# Patient Record
Sex: Female | Born: 1970 | Race: Black or African American | Hispanic: No | Marital: Married | Smoking: Never smoker
Health system: Southern US, Community
[De-identification: ages and names within clinical notes are randomized; demographics above are authoritative.]

## PROBLEM LIST (undated history)

## (undated) DIAGNOSIS — F200 Paranoid schizophrenia: Secondary | ICD-10-CM

## (undated) DIAGNOSIS — D649 Anemia, unspecified: Secondary | ICD-10-CM

## (undated) DIAGNOSIS — R760 Raised antibody titer: Secondary | ICD-10-CM

## (undated) DIAGNOSIS — D219 Benign neoplasm of connective and other soft tissue, unspecified: Secondary | ICD-10-CM

## (undated) DIAGNOSIS — D34 Benign neoplasm of thyroid gland: Secondary | ICD-10-CM

## (undated) DIAGNOSIS — I10 Essential (primary) hypertension: Secondary | ICD-10-CM

## (undated) DIAGNOSIS — E079 Disorder of thyroid, unspecified: Secondary | ICD-10-CM

## (undated) DIAGNOSIS — F22 Delusional disorders: Secondary | ICD-10-CM

## (undated) DIAGNOSIS — I82409 Acute embolism and thrombosis of unspecified deep veins of unspecified lower extremity: Secondary | ICD-10-CM

## (undated) DIAGNOSIS — E041 Nontoxic single thyroid nodule: Secondary | ICD-10-CM

## (undated) DIAGNOSIS — E0500 Thyrotoxicosis with diffuse goiter without thyrotoxic crisis or storm: Secondary | ICD-10-CM

## (undated) DIAGNOSIS — E042 Nontoxic multinodular goiter: Secondary | ICD-10-CM

## (undated) HISTORY — DX: Benign neoplasm of thyroid gland: D34

## (undated) HISTORY — DX: Raised antibody titer: R76.0

## (undated) HISTORY — PX: ABDOMINAL HYSTERECTOMY: SHX81

---

## 2008-11-06 ENCOUNTER — Ambulatory Visit: Payer: Self-pay | Admitting: Internal Medicine

## 2008-11-06 DIAGNOSIS — D649 Anemia, unspecified: Secondary | ICD-10-CM

## 2008-11-06 DIAGNOSIS — R030 Elevated blood-pressure reading, without diagnosis of hypertension: Secondary | ICD-10-CM | POA: Insufficient documentation

## 2008-11-06 DIAGNOSIS — N926 Irregular menstruation, unspecified: Secondary | ICD-10-CM | POA: Insufficient documentation

## 2008-11-07 LAB — CONVERTED CEMR LAB
BUN: 11 mg/dL (ref 6–23)
Basophils Absolute: 0 10*3/uL (ref 0.0–0.1)
Basophils Relative: 0 % (ref 0–1)
CO2: 23 meq/L (ref 19–32)
Calcium: 9.2 mg/dL (ref 8.4–10.5)
Cholesterol: 125 mg/dL (ref 0–200)
Eosinophils Relative: 3 % (ref 0–5)
FSH: 5.4 milliintl units/mL
Glucose, Bld: 99 mg/dL (ref 70–99)
HDL: 41 mg/dL (ref >39)
MCHC: 32.5 g/dL (ref 30.0–36.0)
Monocytes Absolute: 0.3 10*3/uL (ref 0.1–1.0)
Monocytes Relative: 7 % (ref 3–12)
Neutro Abs: 3.2 10*3/uL (ref 1.7–7.7)
Neutrophils Relative %: 61 % (ref 43–77)
Potassium: 4.4 meq/L (ref 3.5–5.3)
TSH: 1.461 microintl units/mL (ref 0.350–4.50)
Total CHOL/HDL Ratio: 3

## 2010-04-17 ENCOUNTER — Encounter: Payer: Self-pay | Admitting: Orthopedic Surgery

## 2010-04-17 ENCOUNTER — Emergency Department (HOSPITAL_COMMUNITY)
Admission: EM | Admit: 2010-04-17 | Discharge: 2010-04-17 | Payer: Self-pay | Source: Home / Self Care | Admitting: Emergency Medicine

## 2010-04-20 ENCOUNTER — Emergency Department (HOSPITAL_COMMUNITY)
Admission: EM | Admit: 2010-04-20 | Discharge: 2010-04-20 | Payer: Self-pay | Source: Home / Self Care | Admitting: Emergency Medicine

## 2010-04-24 ENCOUNTER — Ambulatory Visit: Payer: Self-pay | Admitting: Orthopedic Surgery

## 2010-04-24 DIAGNOSIS — S5000XA Contusion of unspecified elbow, initial encounter: Secondary | ICD-10-CM

## 2011-01-28 NOTE — Letter (Signed)
Summary: History form  History form   Imported By: Jacklynn Ganong 04/30/2010 12:18:34  _____________________________________________________________________  External Attachment:    Type:   Image     Comment:   External Document
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
## 2011-08-26 ENCOUNTER — Ambulatory Visit: Payer: Self-pay | Admitting: Family Medicine

## 2012-06-11 ENCOUNTER — Observation Stay (HOSPITAL_COMMUNITY)
Admission: EM | Admit: 2012-06-11 | Discharge: 2012-06-12 | Disposition: A | Discharge status: Home / Self Care | Payer: 59 | Attending: Obstetrics and Gynecology | Admitting: Obstetrics and Gynecology

## 2012-06-11 ENCOUNTER — Emergency Department (HOSPITAL_COMMUNITY): Payer: 59

## 2012-06-11 ENCOUNTER — Encounter (HOSPITAL_COMMUNITY): Payer: Self-pay | Admitting: *Deleted

## 2012-06-11 DIAGNOSIS — N920 Excessive and frequent menstruation with regular cycle: Secondary | ICD-10-CM | POA: Insufficient documentation

## 2012-06-11 DIAGNOSIS — D649 Anemia, unspecified: Principal | ICD-10-CM | POA: Insufficient documentation

## 2012-06-11 DIAGNOSIS — R05 Cough: Secondary | ICD-10-CM | POA: Insufficient documentation

## 2012-06-11 DIAGNOSIS — N858 Other specified noninflammatory disorders of uterus: Secondary | ICD-10-CM

## 2012-06-11 DIAGNOSIS — R059 Cough, unspecified: Secondary | ICD-10-CM | POA: Insufficient documentation

## 2012-06-11 DIAGNOSIS — D259 Leiomyoma of uterus, unspecified: Secondary | ICD-10-CM | POA: Insufficient documentation

## 2012-06-11 LAB — URINALYSIS, ROUTINE W REFLEX MICROSCOPIC
Glucose, UA: NEGATIVE mg/dL
Ketones, ur: NEGATIVE mg/dL
Leukocytes, UA: NEGATIVE
Protein, ur: 100 mg/dL — AB
Urobilinogen, UA: 1 mg/dL (ref 0.0–1.0)
pH: 6 (ref 5.0–8.0)

## 2012-06-11 LAB — COMPREHENSIVE METABOLIC PANEL
ALT: 14 U/L (ref 0–35)
Albumin: 2.4 g/dL — ABNORMAL LOW (ref 3.5–5.2)
Calcium: 9.5 mg/dL (ref 8.4–10.5)
Creatinine, Ser: 1.27 mg/dL — ABNORMAL HIGH (ref 0.50–1.10)
GFR calc non Af Amer: 51 mL/min — ABNORMAL LOW (ref >90)
Potassium: 3.4 mEq/L — ABNORMAL LOW (ref 3.5–5.1)
Sodium: 136 mEq/L (ref 135–145)
Total Protein: 8.6 g/dL — ABNORMAL HIGH (ref 6.0–8.3)

## 2012-06-11 LAB — CBC
HCT: 18 % — ABNORMAL LOW (ref 36.0–46.0)
Hemoglobin: 4.9 g/dL — CL (ref 12.0–15.0)
MCH: 15.9 pg — ABNORMAL LOW (ref 26.0–34.0)
MCHC: 27.2 g/dL — ABNORMAL LOW (ref 30.0–36.0)
Platelets: 534 10*3/uL — ABNORMAL HIGH (ref 150–400)
RDW: 20.6 % — ABNORMAL HIGH (ref 11.5–15.5)
WBC: 10.1 10*3/uL (ref 4.0–10.5)

## 2012-06-11 LAB — FERRITIN: Ferritin: 97 ng/mL (ref 10–291)

## 2012-06-11 LAB — IRON AND TIBC
Iron: 12 ug/dL — ABNORMAL LOW (ref 42–135)
TIBC: 183 ug/dL — ABNORMAL LOW (ref 250–470)
UIBC: 171 ug/dL (ref 125–400)

## 2012-06-11 LAB — URINE MICROSCOPIC-ADD ON

## 2012-06-11 LAB — PROTIME-INR: INR: 1.33 (ref 0.00–1.49)

## 2012-06-11 LAB — VITAMIN B12: Vitamin B-12: 1233 pg/mL — ABNORMAL HIGH (ref 211–911)

## 2012-06-11 LAB — LIPASE, BLOOD: Lipase: 13 U/L (ref 11–59)

## 2012-06-11 LAB — RETICULOCYTES: RBC.: 3.09 MIL/uL — ABNORMAL LOW (ref 3.87–5.11)

## 2012-06-11 MED ORDER — ONDANSETRON HCL 4 MG/2ML IJ SOLN
4.0000 mg | Freq: Once | INTRAMUSCULAR | Status: AC
  Administered 2012-06-11: 4 mg via INTRAVENOUS
  Filled 2012-06-11: qty 2

## 2012-06-11 MED ORDER — IOHEXOL 300 MG/ML  SOLN
100.0000 mL | Freq: Once | INTRAMUSCULAR | Status: AC | PRN
  Administered 2012-06-11: 100 mL via INTRAVENOUS

## 2012-06-11 MED ORDER — SODIUM CHLORIDE 0.9 % IV BOLUS (SEPSIS)
2000.0000 mL | Freq: Once | INTRAVENOUS | Status: AC
  Administered 2012-06-11: 2000 mL via INTRAVENOUS

## 2012-06-11 NOTE — ED Notes (Signed)
Blood note: 1930 VS 124/82 100 20 98.3 Pt;s blood started 1940. oPos [id number] Started at [employer] provided; Witnessed by second RN Vernill Rice 1957 VS 132/80 97 100% RA 98.2. No reaction noted Blood rate increased to 125cc/ Education provided; IV assessment unremakable///Attemplted to scan blood would not scan; also was unable to change flowsheet times; Several RNs to assess flowsheet for documention

## 2012-06-11 NOTE — ED Provider Notes (Signed)
History     CSN: [id number]  Arrival date & time 06/11/12  1622   First MD Initiated Contact with Patient 06/11/12 1637      Chief Complaint  Patient presents with  . Emesis     The history is provided by the patient.   patient reports 2 and half months of nausea and vomiting.  She reports she is nauseated everyday and has vomited most days the last 2 months.  She believes she is dehydrated and thinks she needs fluids.  She has not seen anyone about the symptoms.  She denies diarrhea and abdominal pain.  She has no chest pain or shortness of breath.  She has had a cough for last 3 weeks he reports several episodes of posttussive emesis.  She also reports that someone palpated her thyroid until echo is abnormal and stated that she does see Dr. about this.  Her first followup with her primary care physician is in 10 days.  It sounds as though multiple family members were asking her to come to the ER to be evaluated.  The patient's 41 years old she has no known medical problems.  She reports she's checked a pregnancy test at home and it's been negative  History reviewed. No pertinent past medical history.  History reviewed. No pertinent past surgical history.  History reviewed. No pertinent family history.  History  Substance Use Topics  . Smoking status: Never Smoker   . Smokeless tobacco: Not on file  . Alcohol Use: No    OB History    Grav Para Term Preterm Abortions TAB SAB Ect Mult Living                  Review of Systems  Gastrointestinal: Positive for vomiting.  All other systems reviewed and are negative.    Allergies  Review of patient's allergies indicates no known allergies.  Home Medications  No current outpatient prescriptions on file.  BP 120/74  Pulse 114  Temp 99.4 F (37.4 C) (Oral)  Resp 18  Ht 5\' 9"  (1.753 m)  Wt 220 lb (99.791 kg)  BMI 32.49 kg/m2  SpO2 100%  LMP 06/02/2012  Physical Exam  Nursing note and vitals reviewed. Constitutional:  She is oriented to person, place, and time. She appears well-developed and well-nourished. No distress.  HENT:  Head: Normocephalic and atraumatic.       Pale conjunctiva  Eyes: EOM are normal. No scleral icterus.       Conjunctiva pale  Neck: Normal range of motion.  Cardiovascular: Normal rate, regular rhythm and normal heart sounds.   Pulmonary/Chest: Effort normal and breath sounds normal.  Abdominal: Soft. She exhibits no distension. There is no tenderness.       Hard  abdominal mass noted. Nontender. No overlying skin changes  Musculoskeletal: Normal range of motion.  Neurological: She is alert and oriented to person, place, and time.  Skin: Skin is warm and dry. There is pallor.  Psychiatric: She has a normal mood and affect. Judgment normal.    ED Course  Procedures (including critical care time)  Labs Reviewed  URINALYSIS, ROUTINE W REFLEX MICROSCOPIC - Abnormal; Notable for the following:    Color, Urine ORANGE (*)  BIOCHEMICALS MAY BE AFFECTED BY COLOR   Hgb urine dipstick TRACE (*)     Bilirubin Urine SMALL (*)     Protein, ur 100 (*)     All other components within normal limits  CBC - Abnormal; Notable for the  following:    RBC 3.08 (*)     Hemoglobin 4.9 (*)     HCT 18.0 (*)     MCV 58.4 (*)     MCH 15.9 (*)     MCHC 27.2 (*)     RDW 20.6 (*)     Platelets 534 (*)     All other components within normal limits  COMPREHENSIVE METABOLIC PANEL - Abnormal; Notable for the following:    Potassium 3.4 (*)     Glucose, Bld 106 (*)     Creatinine, Ser 1.27 (*)     Total Protein 8.6 (*)     Albumin 2.4 (*)     GFR calc non Af Amer 51 (*)     GFR calc Af Amer 59 (*)     All other components within normal limits  RETICULOCYTES - Abnormal; Notable for the following:    RBC. 3.09 (*)     All other components within normal limits  PROTIME-INR - Abnormal; Notable for the following:    Prothrombin Time 16.7 (*)     All other components within normal limits  URINE  MICROSCOPIC-ADD ON - Abnormal; Notable for the following:    Squamous Epithelial / LPF MANY (*)     Bacteria, UA MANY (*)     All other components within normal limits  LIPASE, BLOOD  PREPARE RBC (CROSSMATCH)  HCG, SERUM, QUALITATIVE  TYPE AND SCREEN  ABO/RH  VITAMIN B12  FOLATE  IRON AND TIBC  FERRITIN   Dg Chest 2 View  06/11/2012  *RADIOLOGY REPORT*  Clinical Data: Chest pain.  CHEST - 2 VIEW  Comparison: No priors.  Findings: Lung volumes are normal.  No consolidative airspace disease.  No pleural effusions.  No pneumothorax.  No pulmonary nodule or mass noted.  Pulmonary vasculature and the cardiomediastinal silhouette are within normal limits.  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  IMPRESSION: 1. No radiographic evidence of acute cardiopulmonary disease.  Original Report Authenticated By: Florencia Reasons, M.D.   Ct Abdomen Pelvis W Contrast  06/11/2012  *RADIOLOGY REPORT*  Clinical Data: Emesis.  Cough for the past 3 weeks.  Vomiting.  Low hemoglobin.  CT ABDOMEN AND PELVIS WITH CONTRAST  Technique:  Multidetector CT imaging of the abdomen and pelvis was performed following the standard protocol during bolus administration of intravenous contrast.  Contrast: OMNIPAQUE IOHEXOL 300 MG/ML  SOLN  Comparison: No priors.  Findings:  Lung Bases: Hiatal hernia.  Abdomen/Pelvis:  The enhanced appearance of the liver, gallbladder, pancreas, spleen, bilateral adrenal glands and bilateral kidneys is unremarkable.  There is a very large mass in the central abdomen and anatomic pelvis.  This mass measures approximately 26 x 25.4 x 17.7 cm and has a heterogeneous internal attenuation with some curvilinear calcifications.  This mass is well circumscribed and does not appear to invade adjacent structures, however, this does appear to be intimately associated with the fornices of the vagina, indicating that this is in fact a mass of uterine origin.  The ovaries  are not confidently identified.  Urinary bladder is displaced inferiorly and to the left related to the mass effect from this large mass, but is otherwise unremarkable.  The appendix is normal.  There is no ascites or pneumoperitoneum and no pathologic distension of bowel.  In the left hemi pelvis there is a focal soft tissue lesion (image 61 of series 2) measuring 2.5 x 1.8 cm, favored to represent  an enlarged lymph node.  No other definite lymphadenopathy is identified throughout the abdomen or pelvis on today's examination, however, there are numerous borderline enlarged retroperitoneal lymph nodes which are conspicuous in number rather than size.  Musculoskeletal: There are no aggressive appearing lytic or blastic lesions noted in the visualized portions of the skeleton. 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  IMPRESSION: 1.  Large mass which appears to arise from within the uterus measuring 26.0 x 25.4 x 17.7 cm which demonstrates heterogeneous internal architecture.  This is favored to represents a very large degenerated fibroid (likely with multiple old prior internal hemorrhages), however, an aggressive neoplasm such as a leiomyosarcoma or less likely an endometrial neoplasm could have a similar appearance. Other differential considerations would include cervical stenosis, however, this is not strongly favored.  Clinical correlation is recommended.  There are multiple borderline enlarged retroperitoneal lymph nodes, and there is a single enlarged left pelvic lymph node, as above. 2. 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 This could be better evaluated with MRI of the lower thoracic spine.  Original Report Authenticated By: Florencia Reasons,  M.D.     1. Anemia   2. Uterine mass       MDM  Patient was symptomatic anemia with hemoglobin of 4.9.  The patient has been written to be transfused several units of packed red blood cells.  She has an abdominal mass that is obvious on external exam.  CT demonstrates a large uterine mass is 26 x 25 x 17 cm which may represent either degenerated fibroid with internal hemorrhages versus aggressive neoplasm.   Spoke with Dr Emelda Fear, Gyn who will evaluate the patient at the bedside        Lyanne Co, MD 06/11/12 2025

## 2012-06-11 NOTE — H&P (Signed)
[name] is an 41 y.o. female. She is admitted for transfusions and care planning. She has seen no Gyn providers in 15+ yrs, and is admitted due to fatigue, and  Anemia, having been found to have hgb 5.9 She has been having heavy menses x 3-5 months,and knows that abdomen has been distended by large mass now noted on CT to be a fibroid enlargement extending to the rib cage.  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    History reviewed. No pertinent past medical history.  History reviewed. No pertinent past surgical history.  History reviewed. No pertinent family history.  Social History:  reports that she has never smoked. She does not have any smokeless tobacco history on file. She reports that she does not drink alcohol or use illicit drugs.  Allergies: No Known Allergies   (Not in a hospital admission)  ROSnot bleeding at present. Has desire for children. Allegedly lost 30 pounds over last few months due to anorexia and decreased appetite. May be associated with huge abd mass know to pt x over a year.  Blood pressure 126/86, pulse 88, temperature 98.2 F (36.8 C), temperature source Oral, resp. rate 20, height 5\' 9"  (1.753 m), weight 99.791 kg (220 lb), last menstrual period 06/02/2012, SpO2 100.00%. Physical ExamPhysical Examination: General appearance - alert, well appearing, and in no distress and normal appearing weight Mental status - alert, oriented to person, place, and time, normal mood, behavior, speech, dress, motor activity, and  thought processes Eyes - pupils equal and reactive, extraocular eye movements intact Neck - supple, no significant adenopathy Chest - clear to auscultation, no wheezes, rales or rhonchi, symmetric air entry Heart - normal rate and regular rhythm Abdomen - distended by firm hard nontender mass to the rib cage, larger than a term pregnancy Extremities - peripheral pulses normal, no pedal edema, no clubbing or cyanosis Skin - normal coloration and turgor, no rashes, no suspicious skin lesions noted   Results for orders placed during the hospital encounter of 06/11/12 (from the past 24 hour(s))  CBC     Status: Abnormal   Collection Time   06/11/12  4:50 PM      Component Value Range   WBC 10.1  4.0 - 10.5 K/uL   RBC 3.08 (*) 3.87 - 5.11 MIL/uL   Hemoglobin 4.9 (*) 12.0 - 15.0 g/dL   HCT 16.1 (*) 09.6 - 04.5 %   MCV 58.4 (*) 78.0 - 100.0 fL   MCH 15.9 (*) 26.0 - 34.0 pg   MCHC 27.2 (*) 30.0 - 36.0 g/dL   RDW 40.9 (*) 81.1 - 91.4 %   Platelets 534 (*) 150 - 400 K/uL  COMPREHENSIVE METABOLIC PANEL     Status: Abnormal   Collection Time   06/11/12  4:50 PM      Component Value Range   Sodium 136  135 - 145 mEq/L   Potassium 3.4 (*) 3.5 - 5.1 mEq/L   Chloride 99  96 - 112 mEq/L   CO2 26  19 - 32 mEq/L   Glucose, Bld 106 (*) 70 -  99 mg/dL   BUN 9  6 - 23 mg/dL   Creatinine, Ser 1.61 (*) 0.50 - 1.10 mg/dL   Calcium 9.5  8.4 - 09.6 mg/dL   Total Protein 8.6 (*) 6.0 - 8.3 g/dL   Albumin 2.4 (*) 3.5 - 5.2 g/dL   AST 17  0 - 37 U/L   ALT 14  0 - 35 U/L   Alkaline Phosphatase 108  39 - 117 U/L   Total Bilirubin 0.4  0.3 - 1.2 mg/dL   GFR calc non Af Amer 51 (*) >90 mL/min   GFR calc Af Amer 59 (*) >90 mL/min  LIPASE, BLOOD     Status: Normal   Collection Time   06/11/12  4:50 PM      Component Value Range   Lipase 13  11 - 59 U/L  RETICULOCYTES     Status: Abnormal   Collection Time   06/11/12  5:36 PM      Component Value Range   Retic Ct Pct 1.7  0.4 - 3.1 %   RBC. 3.09 (*) 3.87  - 5.11 MIL/uL   Retic Count, Manual 52.5  19.0 - 186.0 K/uL  PROTIME-INR     Status: Abnormal   Collection Time   06/11/12  5:36 PM      Component Value Range   Prothrombin Time 16.7 (*) 11.6 - 15.2 seconds   INR 1.33  0.00 - 1.49  HCG, SERUM, QUALITATIVE     Status: Normal   Collection Time   06/11/12  5:36 PM      Component Value Range   Preg, Serum NEGATIVE  NEGATIVE  URINALYSIS, ROUTINE W REFLEX MICROSCOPIC     Status: Abnormal   Collection Time   06/11/12  5:55 PM      Component Value Range   Color, Urine ORANGE (*) YELLOW   APPearance CLEAR  CLEAR   Specific Gravity, Urine 1.020  1.005 - 1.030   pH 6.0  5.0 - 8.0   Glucose, UA NEGATIVE  NEGATIVE mg/dL   Hgb urine dipstick TRACE (*) NEGATIVE   Bilirubin Urine SMALL (*) NEGATIVE   Ketones, ur NEGATIVE  NEGATIVE mg/dL   Protein, ur 045 (*) NEGATIVE mg/dL   Urobilinogen, UA 1.0  0.0 - 1.0 mg/dL   Nitrite NEGATIVE  NEGATIVE   Leukocytes, UA NEGATIVE  NEGATIVE  URINE MICROSCOPIC-ADD ON     Status: Abnormal   Collection Time   06/11/12  5:55 PM      Component Value Range   Squamous Epithelial / LPF MANY (*) RARE   WBC, UA 7-10  <3 WBC/hpf   RBC / HPF 3-6  <3 RBC/hpf   Bacteria, UA MANY (*) RARE  PREPARE RBC (CROSSMATCH)     Status: Normal   Collection Time   06/11/12  6:08 PM      Component Value Range   Order Confirmation ORDER PROCESSED BY BLOOD BANK    TYPE AND SCREEN     Status: Normal (Preliminary result)   Collection Time   06/11/12  6:08 PM      Component Value Range   ABO/RH(D) O POS     Antibody Screen NEG     Sample Expiration 06/14/2012     Unit Number [id number]     Blood Component Type RBC LR PHER1     Unit division 00     Status of Unit ISSUED     Transfusion Status OK TO TRANSFUSE     Crossmatch Result  Compatible     Unit Number [id number]     Blood Component Type RED CELLS,LR     Unit division 00     Status of Unit ALLOCATED     Transfusion Status OK TO TRANSFUSE     Crossmatch Result Compatible      Unit Number [id number]     Blood Component Type RED CELLS,LR     Unit division 00     Status of Unit ALLOCATED     Transfusion Status OK TO TRANSFUSE     Crossmatch Result Compatible     Unit Number [id number]     Blood Component Type RBC LR PHER1     Unit division 00     Status of Unit ALLOCATED     Transfusion Status OK TO TRANSFUSE     Crossmatch Result Compatible    ABO/RH     Status: Normal   Collection Time   06/11/12  6:08 PM      Component Value Range   ABO/RH(D) O POS      Dg Chest 2 View  06/11/2012  *RADIOLOGY REPORT*  Clinical Data: Chest pain.  CHEST - 2 VIEW  Comparison: No priors.  Findings: Lung volumes are normal.  No consolidative airspace disease.  No pleural effusions.  No pneumothorax.  No pulmonary nodule or mass noted.  Pulmonary vasculature and the cardiomediastinal silhouette are within normal limits.  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  IMPRESSION: 1. No radiographic evidence of acute cardiopulmonary disease.  Original Report Authenticated By: Florencia Reasons, M.D.   Ct Abdomen Pelvis W Contrast  06/11/2012  *RADIOLOGY REPORT*  Clinical Data: Emesis.  Cough for the past 3 weeks.  Vomiting.  Low hemoglobin.  CT ABDOMEN AND PELVIS WITH CONTRAST  Technique:  Multidetector CT imaging of the abdomen and pelvis was performed following the standard protocol during bolus administration of intravenous contrast.  Contrast: OMNIPAQUE IOHEXOL 300 MG/ML  SOLN  Comparison: No priors.  Findings:  Lung Bases: Hiatal hernia.  Abdomen/Pelvis:  The enhanced appearance of the liver, gallbladder, pancreas, spleen, bilateral adrenal glands and bilateral kidneys is unremarkable.  There is a very large mass in the central abdomen and anatomic pelvis.  This mass measures approximately 26 x 25.4 x 17.7 cm and has a heterogeneous internal attenuation with some curvilinear calcifications.  This mass is well circumscribed and does not appear to  invade adjacent structures, however, this does appear to be intimately associated with the fornices of the vagina, indicating that this is in fact a mass of uterine origin.  The ovaries are not confidently identified.  Urinary bladder is displaced inferiorly and to the left related to the mass effect from this large mass, but is otherwise unremarkable.  The appendix is normal.  There is no ascites or pneumoperitoneum and no pathologic distension of bowel.  In the left hemi pelvis there is a focal soft tissue lesion (image 61 of series 2) measuring 2.5 x 1.8 cm, favored to represent an enlarged lymph node.  No other definite lymphadenopathy is identified throughout the abdomen or pelvis on today's examination, however, there are numerous borderline enlarged retroperitoneal lymph nodes which are conspicuous in number rather than size.  Musculoskeletal: There are no aggressive appearing lytic or blastic lesions noted in the visualized portions of the skeleton. 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  IMPRESSION: 1.  Large mass which appears to arise from within the uterus measuring 26.0 x 25.4 x 17.7 cm which demonstrates heterogeneous internal architecture.  This is favored to represents a very large degenerated fibroid (likely with multiple old prior internal hemorrhages), however, an aggressive neoplasm such as a leiomyosarcoma or less likely an endometrial neoplasm could have a similar appearance. Other differential considerations would include cervical stenosis, however, this is not strongly favored.  Clinical correlation is recommended.  There are multiple borderline enlarged retroperitoneal lymph nodes, and there is a single enlarged left pelvic lymph node, as above. 2. 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 This could be better evaluated with MRI of the lower thoracic spine.  Original Report Authenticated By: Florencia Reasons, M.D.    Assessment/Plan: Severe anemia, likely chronic Massive fibroid uterus PLAN; Transfuse 4 units rbc              Hemoccult             Office visit ASAP for pap, mammogram,             Probable LUPRON suppression of fibroid prior to scheduling hyster.             Discuss fertility or adoption plans with patient prior to final surgical decision making.   Tanay Massiah V 06/11/2012, 10:00 PM

## 2012-06-11 NOTE — ED Notes (Signed)
Pt unable to void at this time.  Informed pt of needed urine sample.  Verbalized understanding.

## 2012-06-11 NOTE — ED Notes (Signed)
Attempted to call report. RN to call back. 

## 2012-06-11 NOTE — ED Notes (Signed)
hemocult neg result.

## 2012-06-11 NOTE — ED Notes (Addendum)
Second unit of blood started at 50 cc/hr Unit number [id number]  VS 15 min after 111/79  20 89  99.0 No rx after 15 min blood rate increased to 125/h IV site WNL

## 2012-06-11 NOTE — ED Notes (Signed)
Lab called blood is ready to transfuse; Pt in CT at this time; family at bedside

## 2012-06-11 NOTE — ED Notes (Signed)
CRITICAL VALUE ALERT  Critical value received:  hgb 4.9  Date of notification:  06/11/2012  Time of notification:  1735  Critical value read back:yes  Nurse who received alert:  Marliss Czar, RN  MD notified (1st page):  Dr. Azalia Bilis  Time of first page:  1735  MD notified (2nd page):  Time of second page:  Responding MD:  Dr. Azalia Bilis  Time MD responded:  [phone]

## 2012-06-11 NOTE — ED Notes (Signed)
Informed consent for blood transfusion signed and placed in chart.

## 2012-06-11 NOTE — ED Notes (Signed)
"   I'm dehydrated , need my thyroid checked and I have a cough".  Vomiting,No diarrhea.  Cough x 3 weeks.

## 2012-06-12 DIAGNOSIS — D259 Leiomyoma of uterus, unspecified: Secondary | ICD-10-CM | POA: Diagnosis present

## 2012-06-12 LAB — BASIC METABOLIC PANEL
Chloride: 103 mEq/L (ref 96–112)
GFR calc non Af Amer: 71 mL/min — ABNORMAL LOW (ref >90)
Glucose, Bld: 95 mg/dL (ref 70–99)
Potassium: 3.3 mEq/L — ABNORMAL LOW (ref 3.5–5.1)
Sodium: 136 mEq/L (ref 135–145)

## 2012-06-12 LAB — CBC
HCT: 24.3 % — ABNORMAL LOW (ref 36.0–46.0)
Hemoglobin: 7.4 g/dL — ABNORMAL LOW (ref 12.0–15.0)
MCH: 20.2 pg — ABNORMAL LOW (ref 26.0–34.0)
RBC: 3.67 MIL/uL — ABNORMAL LOW (ref 3.87–5.11)

## 2012-06-12 MED ORDER — TRAMADOL HCL 50 MG PO TABS: 50.0000 mg | ORAL_TABLET | Freq: Four times a day (QID) | ORAL | Status: DC | PRN

## 2012-06-12 MED ORDER — METOCLOPRAMIDE HCL 5 MG/ML IJ SOLN: 10.0000 mg | Freq: Four times a day (QID) | INTRAMUSCULAR | Status: DC | PRN

## 2012-06-12 MED ORDER — ACETAMINOPHEN 325 MG PO TABS: 650.0000 mg | ORAL_TABLET | ORAL | Status: DC | PRN

## 2012-06-12 MED ORDER — SODIUM CHLORIDE 0.9 % IJ SOLN: 3.0000 mL | INTRAMUSCULAR | Status: DC | PRN

## 2012-06-12 MED ORDER — CENTRUM PO CHEW: 1.0000 | CHEWABLE_TABLET | Freq: Every day | ORAL | Status: DC

## 2012-06-12 MED ORDER — SODIUM CHLORIDE 0.9 % IV SOLN: 250.0000 mL | INTRAVENOUS | Status: DC | PRN

## 2012-06-12 MED ORDER — ZOLPIDEM TARTRATE 5 MG PO TABS: 5.0000 mg | ORAL_TABLET | Freq: Every evening | ORAL | Status: DC | PRN

## 2012-06-12 MED ORDER — SODIUM CHLORIDE 0.9 % IJ SOLN
3.0000 mL | Freq: Two times a day (BID) | INTRAMUSCULAR | Status: DC
  Administered 2012-06-12: 3 mL via INTRAVENOUS

## 2012-06-12 NOTE — Discharge Summary (Addendum)
Subjective: Patient reports tolerating PO and no problems voiding.  She received 4 units packed cells just completed about 2 hours ago, hemoglobin hematocrit pending. No vaginal bleeding at present. LMP June 6 x7 days  Objective: I have reviewed patient's vital signs and labs. 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

## 2012-06-12 NOTE — Discharge Instructions (Signed)

## 2012-06-12 NOTE — Progress Notes (Signed)
Pt. Discharged to personal vehicle via wheelchair. Currently voices no c/o pain or discomfort. Discharge instructions,meds, and education sheets reviewed with pt. With good understanding.

## 2012-06-13 LAB — TYPE AND SCREEN
ABO/RH(D): O POS
Antibody Screen: NEGATIVE
Unit division: 0
Unit division: 0
Unit division: 0

## 2012-06-17 NOTE — Progress Notes (Signed)
UR Chart Review Completed  

## 2012-06-23 ENCOUNTER — Other Ambulatory Visit: Payer: Self-pay | Admitting: Obstetrics and Gynecology

## 2012-06-23 ENCOUNTER — Other Ambulatory Visit (HOSPITAL_COMMUNITY)
Admission: RE | Admit: 2012-06-23 | Discharge: 2012-06-23 | Disposition: A | Discharge status: Home / Self Care | Payer: 59 | Source: Ambulatory Visit | Attending: Obstetrics and Gynecology | Admitting: Obstetrics and Gynecology

## 2012-06-23 DIAGNOSIS — Z113 Encounter for screening for infections with a predominantly sexual mode of transmission: Secondary | ICD-10-CM | POA: Insufficient documentation

## 2012-06-23 DIAGNOSIS — Z01419 Encounter for gynecological examination (general) (routine) without abnormal findings: Secondary | ICD-10-CM | POA: Insufficient documentation

## 2012-07-18 ENCOUNTER — Encounter (HOSPITAL_COMMUNITY): Payer: Self-pay

## 2012-07-18 ENCOUNTER — Inpatient Hospital Stay (HOSPITAL_COMMUNITY)
Admission: EM | Admit: 2012-07-18 | Discharge: 2012-07-20 | DRG: 812 | Disposition: A | Discharge status: Home / Self Care | Payer: 59 | Attending: Obstetrics and Gynecology | Admitting: Obstetrics and Gynecology

## 2012-07-18 DIAGNOSIS — D649 Anemia, unspecified: Principal | ICD-10-CM | POA: Diagnosis present

## 2012-07-18 DIAGNOSIS — D259 Leiomyoma of uterus, unspecified: Secondary | ICD-10-CM | POA: Diagnosis present

## 2012-07-18 LAB — CBC WITH DIFFERENTIAL/PLATELET
Band Neutrophils: 0 % (ref 0–10)
Basophils Absolute: 0 10*3/uL (ref 0.0–0.1)
Eosinophils Absolute: 0 10*3/uL (ref 0.0–0.7)
Eosinophils Relative: 0 % (ref 0–5)
HCT: 21.4 % — ABNORMAL LOW (ref 36.0–46.0)
MCH: 19.6 pg — ABNORMAL LOW (ref 26.0–34.0)
MCV: 66.5 fL — ABNORMAL LOW (ref 78.0–100.0)
Metamyelocytes Relative: 0 %
Monocytes Absolute: 0.3 10*3/uL (ref 0.1–1.0)
Monocytes Relative: 2 % — ABNORMAL LOW (ref 3–12)
RBC: 3.22 MIL/uL — ABNORMAL LOW (ref 3.87–5.11)
WBC: 15.9 10*3/uL — ABNORMAL HIGH (ref 4.0–10.5)

## 2012-07-18 LAB — BASIC METABOLIC PANEL
Calcium: 9.3 mg/dL (ref 8.4–10.5)
GFR calc Af Amer: 31 mL/min — ABNORMAL LOW (ref >90)
GFR calc non Af Amer: 27 mL/min — ABNORMAL LOW (ref >90)
Sodium: 129 mEq/L — ABNORMAL LOW (ref 135–145)

## 2012-07-18 LAB — PROTIME-INR
INR: 1.53 — ABNORMAL HIGH (ref 0.00–1.49)
Prothrombin Time: 18.7 seconds — ABNORMAL HIGH (ref 11.6–15.2)

## 2012-07-18 MED ORDER — DIPHENHYDRAMINE HCL 25 MG PO CAPS
ORAL_CAPSULE | ORAL | Status: AC
  Administered 2012-07-18: 23:00:00
  Filled 2012-07-18: qty 1

## 2012-07-18 MED ORDER — SODIUM CHLORIDE 0.9 % IJ SOLN
INTRAMUSCULAR | Status: AC
  Administered 2012-07-18
  Filled 2012-07-18: qty 3

## 2012-07-18 MED ORDER — DIPHENHYDRAMINE HCL 25 MG PO CAPS
ORAL_CAPSULE | ORAL | Status: AC
  Administered 2012-07-18: 23:00:00
  Filled 2012-07-18: qty 2

## 2012-07-18 MED ORDER — ACETAMINOPHEN 325 MG PO TABS
ORAL_TABLET | ORAL | Status: AC
  Administered 2012-07-18: 23:00:00
  Filled 2012-07-18: qty 2

## 2012-07-18 NOTE — ED Provider Notes (Signed)
History  This chart was scribed for Hilario Quarry, MD by Erskine Emery. This patient was seen in room APA10/APA10 and the patient's care was started at 16:06.   CSN: [id number]  Arrival date & time 07/18/12  1502   First MD Initiated Contact with Patient 07/18/12 1606      Chief Complaint  Patient presents with  . Weakness    (Consider location/radiation/quality/duration/timing/severity/associated sxs/prior treatment) HPI  [name] is a 41 y.o. female who presents to the Emergency Department complaining she got "too hot" on Friday (3 days ago) with associated weakness, nausea, lightheadedness, and headache. Pt denies any vaginal bleeding and reports only normal periods. 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 Pt reports she was seen here previously for the fibroid tumor and dehydration. Pt reports her thyroid was checked the last time she was here in June (for the dehydration) and it was fine, pt also had a CAT scan and blood work then. Pt last saw Dr. Emelda Fear in July and reports she is going to another doctor at the [facility] tomorrow for the fibroid.   History reviewed. No pertinent past medical history.  History reviewed. No pertinent past surgical history.  No family history on file.  History  Substance Use Topics  . Smoking status: Never Smoker   . Smokeless tobacco: Not on file  . Alcohol Use: No    OB History    Grav Para Term Preterm Abortions TAB SAB Ect Mult Living                  Review of Systems  Constitutional: Negative for fever and chills.  Respiratory: Negative for cough.   Gastrointestinal: Positive for nausea. Negative for vomiting.  Genitourinary: Negative for vaginal bleeding.  Neurological: Positive for weakness and light-headedness. Negative for syncope.  Psychiatric/Behavioral: Negative for behavioral problems and confusion.  All other systems reviewed and are negative.    Allergies  Review of patient's allergies indicates  no known allergies.  Home Medications   Current Outpatient Rx  Name Route Sig Dispense Refill  . CENTRUM PO CHEW Oral Chew 1 tablet by mouth daily. 1 tablet prn    Triage Vitals: BP 127/96  Pulse 122  Temp 98.6 F (37 C) (Oral)  Resp 20  SpO2 100%  Physical Exam  Nursing note and vitals reviewed. Constitutional: She is oriented to person, place, and time. She appears well-developed and well-nourished. No distress.  HENT:  Head: Normocephalic and atraumatic.          Eyes: EOM are normal. Pupils are equal, round, and reactive to light.       Pale conjunctiva.  Neck: Neck supple. No tracheal deviation present.  Cardiovascular: Normal rate, regular rhythm and normal heart sounds.   Pulmonary/Chest: Effort normal and breath sounds normal. No respiratory distress.  Abdominal: Soft. She exhibits mass. There is no tenderness.       Large abdominal mass, firm, nontender.   Musculoskeletal: Normal range of motion. She exhibits no edema.  Neurological: She is alert and oriented to person, place, and time.  Skin: Skin is warm and dry.  Psychiatric: She has a normal mood and affect. Her behavior is normal. Thought content normal.    ED Course  Procedures (including critical care time)  DIAGNOSTIC STUDIES: Oxygen Saturation is 100% on room air, normal by my interpretation.    COORDINATION OF CARE: 16:20--I evaluated the patient and we discussed a treatment plan to which the pt agreed.  Labs Reviewed - No data to display No results found.   No diagnosis found.    MDM   Patient with symptomatic anemia secondary to large fibroid tumor.  Patient being transfuse prbc and care discussed with Dr. Emelda Fear.  He will admit for continued care and possible surgery.      I personally performed the services described in this documentation, which was scribed in my presence. The recorded information has been reviewed and considered.   Hilario Quarry, MD 07/21/12 [phone]

## 2012-07-18 NOTE — ED Notes (Signed)
Pt [place] and [place]".  Pt reports weakness since then.  Pt denies any other symptoms.

## 2012-07-19 LAB — CBC
HCT: 24.1 % — ABNORMAL LOW (ref 36.0–46.0)
MCH: 21.2 pg — ABNORMAL LOW (ref 26.0–34.0)
MCHC: 30.7 g/dL (ref 30.0–36.0)
MCV: 69.1 fL — ABNORMAL LOW (ref 78.0–100.0)
RDW: 25.4 % — ABNORMAL HIGH (ref 11.5–15.5)

## 2012-07-19 LAB — BASIC METABOLIC PANEL
BUN: 34 mg/dL — ABNORMAL HIGH (ref 6–23)
CO2: 26 mEq/L (ref 19–32)
Chloride: 99 mEq/L (ref 96–112)
Creatinine, Ser: 2.06 mg/dL — ABNORMAL HIGH (ref 0.50–1.10)

## 2012-07-19 MED ORDER — SODIUM CHLORIDE 0.9 % IJ SOLN
INTRAMUSCULAR | Status: AC
  Administered 2012-07-19: 16:00:00
  Filled 2012-07-19: qty 3

## 2012-07-19 MED ORDER — SODIUM CHLORIDE 0.9 % IV SOLN
INTRAVENOUS | Status: DC
  Administered 2012-07-19: 100 mL/h via INTRAVENOUS
  Administered 2012-07-20: 05:00:00 via INTRAVENOUS

## 2012-07-19 MED ORDER — ONDANSETRON HCL 4 MG/2ML IJ SOLN: 4.0000 mg | Freq: Four times a day (QID) | INTRAMUSCULAR | Status: DC | PRN

## 2012-07-19 MED ORDER — HYDROCODONE-ACETAMINOPHEN 5-325 MG PO TABS
2.0000 | ORAL_TABLET | ORAL | Status: DC | PRN
  Administered 2012-07-19: 1 via ORAL
  Administered 2012-07-20: 2 via ORAL
  Filled 2012-07-19: qty 1
  Filled 2012-07-19: qty 2

## 2012-07-19 MED ORDER — ZOLPIDEM TARTRATE 5 MG PO TABS: 5.0000 mg | ORAL_TABLET | Freq: Every evening | ORAL | Status: DC | PRN

## 2012-07-19 MED ORDER — ONDANSETRON HCL 4 MG PO TABS
4.0000 mg | ORAL_TABLET | Freq: Four times a day (QID) | ORAL | Status: DC | PRN
  Filled 2012-07-19: qty 1

## 2012-07-19 NOTE — Care Management Note (Signed)
    Page 1 of 1   07/20/2012     2:27:18 PM   CARE MANAGEMENT NOTE 07/20/2012  Patient:  [name], [name]   Account Number:  [id number]  Date Initiated:  07/19/2012  Documentation initiated by:  Sharrie Rothman  Subjective/Objective Assessment:   Pt admitted from home with anemia. Pt lives with husband and will return home with husband at discharge. Pt is independent with ADL's.     Action/Plan:   No CM needs noted.   Anticipated DC Date:  07/20/2012   Anticipated DC Plan:  HOME/SELF CARE         Choice offered to / List presented to:             Status of service:  Completed, signed off Medicare Important Message given?   (If response is "NO", the following Medicare IM given date fields will be blank) Date Medicare IM given:   Date Additional Medicare IM given:    Discharge Disposition:  HOME/SELF CARE  Per UR Regulation:    If discussed at Long Length of Stay Meetings, dates discussed:    Comments:  07/20/12 1426 Arlyss Queen, RN BSN CM Pt discharged home today. No CM needs noted.  07/19/12 1421 Arlyss Queen, RN BSN CM

## 2012-07-19 NOTE — Progress Notes (Signed)
Notified Dr. Emelda Fear of pt c/o nausea. New order given and followed.

## 2012-07-20 LAB — CBC
MCH: 22.6 pg — ABNORMAL LOW (ref 26.0–34.0)
MCV: 70.4 fL — ABNORMAL LOW (ref 78.0–100.0)
Platelets: 316 10*3/uL (ref 150–400)
RDW: 25.3 % — ABNORMAL HIGH (ref 11.5–15.5)

## 2012-07-20 LAB — TYPE AND SCREEN: Unit division: 0

## 2012-07-20 MED ORDER — HYDROCODONE-ACETAMINOPHEN 5-325 MG PO TABS: 2.0000 | ORAL_TABLET | ORAL | Status: AC | PRN

## 2012-07-20 NOTE — Progress Notes (Signed)
This 41 yr old patient , well known to me, has been readmitted for transfusions due to symptomatic anemia, not responding to oral efforts with iron and multivitamins. Patient has a huge fibroid uterus greater than 9 months gestation size that was found an emergency room visit earlier this year when she presented with severe anemia was admitted for transfusion and scheduled for followup in our office to receive Lupron  11.25 mg IM to begin  suppression of the uterus in an effort to prepare for surgery. The patient refused to get her Lupron prescription due to costs considerations, though the prior authorization issues were addressed. She returned to the emergency room here after becoming lightheaded on the busy day outside in the  heat, and has now received 4 units packed cells with subsequent hemoglobin improvement 8.8 hematocrit 27.4 compared to admission hemoglobin 6.3, which must have been hemoconcentrated,. The patient now wants "something done right away." Treatment alternatives here and by referral of been discussed with patient. She agrees to the following. We will: 1 will see her in the office Monday performed a Pap smear that is stated and attempted endometrial biopsy period. Patient began her menstrual period yesterday, like0ly that would prevent adequate Pap smear today. 2. To try coordinate referral to [facility] where she would be willing to go for second opinion and probable surgical removal of the uterus . 3 patient is stable at present for discharge. Subjective: Patient reports tolerating PO and + flatus.    Objective: I have reviewed patient's vital signs and labs.  General: alert, no distress, pale and Noted for exophthalmos which patient says his family have condition Resp: clear to auscultation bilaterally GI: Large mass effect reaches almost to the xiphoid previously confirmed as due to fibroid uterus Extremities: Homans sign is negative, no sign of DVT Vaginal Bleeding:  moderate Neck: Supple without thyromegaly  Assessment/Plan: Symptomatic anemia, corrected Massive fibroid uterus No Pap smear in 30 years Plan discharge home coordinate outpatient Pap smear endometrial biopsy and referral to a facility acceptable to  the patient, likely [facility]    LOS: 2 days    Wesson Stith V 07/20/2012, 12:23 PM

## 2012-07-20 NOTE — Progress Notes (Signed)
UR Chart Review Completed  

## 2012-07-20 NOTE — Discharge Summary (Signed)
Physician Discharge Summary  Patient ID: [name] MRN: [id number] DOB/AGE: 1971/05/08 41 y.o.  Admit date: 07/18/2012 Discharge date: 07/20/2012  Admission Diagnoses: Symptomatic anemia, fibroid uterus, on menses  Discharge Diagnoses: Symptomatic anemia, corrected                                          fibroid uterus massive Active Problems:  * No active hospital problems. *    Discharged Condition: good  Hospital Course: Patient was admitted received 4 units packed cells, but efforts made to arrange for the patient received Lupron injection. The patient no longer is willing to consider waiting and remains resistant to management plans offered. He has now agreed to see [place] in the office when her current menstrual period ends, were Pap smear endometrial biopsy will be performed and referral to tertiary center for surgery will be arranged  Consults: None  Significant Diagnostic Studies: labs:  CBC    Component Value Date/Time   WBC 12.7* 07/20/2012 0056   RBC 3.89 07/20/2012 0056   HGB 8.8* 07/20/2012 0056   HCT 27.4* 07/20/2012 0056   PLT 316 07/20/2012 0056   MCV 70.4* 07/20/2012 0056   MCH 22.6* 07/20/2012 0056   MCHC 32.1 07/20/2012 0056   RDW 25.3* 07/20/2012 0056   LYMPHSABS 0.6* 07/18/2012 1640   MONOABS 0.3 07/18/2012 1640   EOSABS 0.0 07/18/2012 1640   BASOSABS 0.0 07/18/2012 1640    Treatments: Transfusion 4 units packed cells  Discharge Exam: Blood pressure 96/62, pulse 88, temperature 97.6 F (36.4 C), temperature source Oral, resp. rate 20, height 5\' 5"  (1.651 m), weight 110.2 kg (242 lb 15.2 oz), SpO2 95.00%. General appearance: no distress GI: Stable fibroid uterus enlargement to xiphoid  Disposition: 01-Home or Self Care   Medication List  As of 07/20/2012 12:49 PM   ASK your doctor about these medications         metroNIDAZOLE 500 MG tablet   Commonly known as: FLAGYL   Take 500 mg by mouth 2 (two) times daily.           and iron tablets daily and  multivitamin with folic acid  Followup next Monday [facility] Signed: Tilda Burrow 07/20/2012, 12:49 PM

## 2012-07-20 NOTE — H&P (Signed)
[name] is an 41 y.o. female. This 41 yr old patient , well known to me, has been readmitted for transfusions due to symptomatic anemia, not responding to oral efforts with iron and multivitamins. Patient has a huge fibroid uterus greater than 9 months gestation size that was found an emergency room visit earlier this year, mid June, when she presented with severe anemia was admitted for transfusion and scheduled for followup in our office to receive Lupron 11.25 mg IM to begin suppression of the uterus in an effort to prepare for surgery. The patient refused to get her Lupron prescription due to costs considerations, though the prior authorization issues were addressed. She returned to the emergency room here after becoming lightheaded on the busy day outside in the heat, and has now received 4 units packed cells with subsequent hemoglobin improvement 8.8 hematocrit 27.4 compared to admission hemoglobin 6.3, which must have been hemoconcentrated,. She explains the fact that she's not made it to my office for followup care that she has been too busy into much going on, he mentioned transportation but had her lightheaded spell today in [place] where she was at a party for a family member.   Pertinent Gynecological History: Menses: flow is moderate Bleeding: Menses begin Saturday described the patient as light Contraception: none DES exposure: unknown Blood transfusions: Received 2 units packed cells in June Sexually transmitted diseases: no past history Previous GYN Procedures: None  Last mammogram: None Date:  Last pap: None in 30 years Date:  OB History: G0, P0   Menstrual History: Menarche age:  No LMP recorded. July 20    History reviewed. No pertinent past medical history.  History reviewed. No pertinent past surgical history.  No family history on file.  Social History:  reports that she has never smoked. She does not have any smokeless tobacco history on file. She reports  that she does not drink alcohol or use illicit drugs.  Allergies: No Known Allergies  Prescriptions prior to admission  Medication Sig Dispense Refill  . metroNIDAZOLE (FLAGYL) 500 MG tablet Take 500 mg by mouth 2 (two) times daily.        ROS  Blood pressure 96/62, pulse 88, temperature 97.6 F (36.4 C), temperature source Oral, resp. rate 20, height 5\' 5"  (1.651 m), weight 110.2 kg (242 lb 15.2 oz), SpO2 95.00%. Physical ExamPhysical Examination: General appearance - alert, well appearing, and in no distress, oriented to person, place, and time and normal appearing weight Mental status - alert, oriented to person, place, and time, normal mood, behavior, speech, dress, motor activity, and thought processes Eyes - pupils equal and reactive, extraocular eye movements intact, no nystagmus, prominent exophthalmos which patient attributes to family natural appearance, Neck - supple, no significant adenopathy, thyroid exam: thyroid is normal in size without nodules or tenderness Chest - clear to auscultation, no wheezes, rales or rhonchi, symmetric air entry Heart - normal rate and regular rhythm Abdomen - soft, nontender, nondistended, no masses or organomegaly Large fibroid mass to the xiphoid. Previous ultrasound showed measurements 26 x 25 x 17 cm see ultrasound from June 17 Extremities - peripheral pulses normal, no pedal edema, no clubbing or cyanosis   No results found.  Assessment/Plan: Severe symptomatic anemia, fibroid uterus, on menses excellent  plan: patient was admitted and received 4 units packed cells   Koreen Lizaola V 07/20/2012, 12:40 PM

## 2012-07-21 NOTE — Progress Notes (Signed)
UR Chart Review Completed  

## 2012-08-28 ENCOUNTER — Emergency Department (HOSPITAL_COMMUNITY): Payer: 59

## 2012-08-28 ENCOUNTER — Inpatient Hospital Stay (HOSPITAL_COMMUNITY)
Admission: EM | Admit: 2012-08-28 | Discharge: 2012-09-01 | DRG: 844 | Disposition: A | Discharge status: Home / Self Care | Payer: 59 | Attending: Internal Medicine | Admitting: Internal Medicine

## 2012-08-28 ENCOUNTER — Encounter (HOSPITAL_COMMUNITY): Payer: Self-pay | Admitting: Emergency Medicine

## 2012-08-28 DIAGNOSIS — N390 Urinary tract infection, site not specified: Secondary | ICD-10-CM

## 2012-08-28 DIAGNOSIS — E8809 Other disorders of plasma-protein metabolism, not elsewhere classified: Principal | ICD-10-CM | POA: Diagnosis present

## 2012-08-28 DIAGNOSIS — R609 Edema, unspecified: Secondary | ICD-10-CM

## 2012-08-28 DIAGNOSIS — R030 Elevated blood-pressure reading, without diagnosis of hypertension: Secondary | ICD-10-CM

## 2012-08-28 DIAGNOSIS — E0500 Thyrotoxicosis with diffuse goiter without thyrotoxic crisis or storm: Secondary | ICD-10-CM | POA: Diagnosis present

## 2012-08-28 DIAGNOSIS — N926 Irregular menstruation, unspecified: Secondary | ICD-10-CM

## 2012-08-28 DIAGNOSIS — N189 Chronic kidney disease, unspecified: Secondary | ICD-10-CM | POA: Diagnosis present

## 2012-08-28 DIAGNOSIS — Z91199 Patient's noncompliance with other medical treatment and regimen due to unspecified reason: Secondary | ICD-10-CM

## 2012-08-28 DIAGNOSIS — D62 Acute posthemorrhagic anemia: Secondary | ICD-10-CM | POA: Diagnosis present

## 2012-08-28 DIAGNOSIS — Z9119 Patient's noncompliance with other medical treatment and regimen: Secondary | ICD-10-CM

## 2012-08-28 DIAGNOSIS — D259 Leiomyoma of uterus, unspecified: Secondary | ICD-10-CM | POA: Diagnosis present

## 2012-08-28 DIAGNOSIS — E46 Unspecified protein-calorie malnutrition: Secondary | ICD-10-CM | POA: Diagnosis present

## 2012-08-28 DIAGNOSIS — I890 Lymphedema, not elsewhere classified: Secondary | ICD-10-CM | POA: Diagnosis present

## 2012-08-28 DIAGNOSIS — R7989 Other specified abnormal findings of blood chemistry: Secondary | ICD-10-CM | POA: Diagnosis not present

## 2012-08-28 DIAGNOSIS — E876 Hypokalemia: Secondary | ICD-10-CM | POA: Diagnosis present

## 2012-08-28 DIAGNOSIS — N179 Acute kidney failure, unspecified: Secondary | ICD-10-CM | POA: Diagnosis present

## 2012-08-28 DIAGNOSIS — D500 Iron deficiency anemia secondary to blood loss (chronic): Secondary | ICD-10-CM | POA: Diagnosis present

## 2012-08-28 DIAGNOSIS — N921 Excessive and frequent menstruation with irregular cycle: Secondary | ICD-10-CM

## 2012-08-28 HISTORY — DX: Disorder of thyroid, unspecified: E07.9

## 2012-08-28 HISTORY — DX: Benign neoplasm of connective and other soft tissue, unspecified: D21.9

## 2012-08-28 LAB — URINALYSIS, ROUTINE W REFLEX MICROSCOPIC
Glucose, UA: NEGATIVE mg/dL
Protein, ur: 100 mg/dL — AB
Specific Gravity, Urine: 1.025 (ref 1.005–1.030)
Urobilinogen, UA: 1 mg/dL (ref 0.0–1.0)

## 2012-08-28 LAB — COMPREHENSIVE METABOLIC PANEL
Albumin: 2.3 g/dL — ABNORMAL LOW (ref 3.5–5.2)
BUN: 34 mg/dL — ABNORMAL HIGH (ref 6–23)
Calcium: 9.6 mg/dL (ref 8.4–10.5)
Chloride: 100 mEq/L (ref 96–112)
Creatinine, Ser: 2.07 mg/dL — ABNORMAL HIGH (ref 0.50–1.10)
GFR calc non Af Amer: 28 mL/min — ABNORMAL LOW (ref >90)
Total Bilirubin: 0.6 mg/dL (ref 0.3–1.2)

## 2012-08-28 LAB — CBC WITH DIFFERENTIAL/PLATELET
Basophils Absolute: 0 10*3/uL (ref 0.0–0.1)
HCT: 25 % — ABNORMAL LOW (ref 36.0–46.0)
Lymphs Abs: 1.2 10*3/uL (ref 0.7–4.0)
MCH: 21.2 pg — ABNORMAL LOW (ref 26.0–34.0)
MCHC: 30 g/dL (ref 30.0–36.0)
MCV: 70.8 fL — ABNORMAL LOW (ref 78.0–100.0)
Monocytes Absolute: 0.5 10*3/uL (ref 0.1–1.0)
Monocytes Relative: 7 % (ref 3–12)
Neutro Abs: 5.4 10*3/uL (ref 1.7–7.7)
Platelets: 340 10*3/uL (ref 150–400)
RDW: 25.2 % — ABNORMAL HIGH (ref 11.5–15.5)

## 2012-08-28 LAB — D-DIMER, QUANTITATIVE: D-Dimer, Quant: 4.1 ug/mL-FEU — ABNORMAL HIGH (ref 0.00–0.48)

## 2012-08-28 LAB — URINE MICROSCOPIC-ADD ON

## 2012-08-28 LAB — APTT: aPTT: 26 seconds (ref 24–37)

## 2012-08-28 LAB — SAMPLE TO BLOOD BANK

## 2012-08-28 LAB — PROTIME-INR: Prothrombin Time: 14.2 seconds (ref 11.6–15.2)

## 2012-08-28 MED ORDER — SODIUM CHLORIDE 0.9 % IV SOLN
Freq: Once | INTRAVENOUS | Status: AC
  Administered 2012-08-28: 22:00:00 via INTRAVENOUS

## 2012-08-28 NOTE — ED Notes (Signed)
Patient complaining of swelling in bilateral legs and feet. States "I have a fibroid tumor and it is pushing on everything and my thyroid is messed up." Swelling noted in bilateral hands and feet at triage.

## 2012-08-28 NOTE — ED Provider Notes (Signed)
Dr. Phillips Odor to admit pt to [facility].  Benny Lennert, MD 08/28/12 [phone]

## 2012-08-28 NOTE — ED Provider Notes (Signed)
History     CSN: [id number]  Arrival date & time 08/28/12  2105   First MD Initiated Contact with Patient 08/28/12 2120      Chief Complaint  Patient presents with  . Leg Swelling    (Consider location/radiation/quality/duration/timing/severity/associated sxs/prior treatment) HPI Comments: The patient is a 41 year old woman who has a known large uterine fibroid, approximately size for 9 months pregnancy. She had been diagnosed with this in June of 2013. In both June and July of 2013 she had required transfusions because of significant anemia that was symptomatic. She is followed by Christin Bach M.D. her OB/GYN, who is trying to prepare her for surgery. She was recently diagnosed with hyperthyroidism and is on methimazole, having just started that recently. For the past several days she's had swelling of her feet and hands, with a swelling of her feet so pronounce it is painful to walk. She therefore seeks evaluation.  Patient is a 41 y.o. female presenting with general illness. The history is provided by the patient and medical records. No language interpreter was used.  Illness  The current episode started 5 to 7 days ago (She has had progressive swelling of her feet and hands for several days, to the point that it is painful for her to walk.). The onset was gradual. The problem occurs continuously. The problem has been gradually worsening. The problem is severe. Nothing relieves the symptoms. Nothing aggravates the symptoms. Associated symptoms include constipation and cough. Pertinent negatives include no orthopnea, no fever and no abdominal pain. Urine output has been normal. Recently, medical care has been given by a specialist. Services received include medications given.    Past Medical History  Diagnosis Date  . Thyroid disease   . Fibroid tumor     History reviewed. No pertinent past surgical history.  History reviewed. No pertinent family history.  History  Substance Use  Topics  . Smoking status: Never Smoker   . Smokeless tobacco: Not on file  . Alcohol Use: No    OB History    Grav Para Term Preterm Abortions TAB SAB Ect Mult Living                  Review of Systems  Constitutional: Negative.  Negative for fever and chills.  HENT: Negative.   Eyes: Negative.   Respiratory: Positive for cough.        She has had a recent cold and cough.  Cardiovascular: Positive for leg swelling. Negative for orthopnea.  Gastrointestinal: Positive for constipation. Negative for abdominal pain.  Genitourinary: Negative.   Musculoskeletal: Negative.   Skin: Negative.   Psychiatric/Behavioral: Negative.     Allergies  Review of patient's allergies indicates no known allergies.  Home Medications  No current outpatient prescriptions on file.  BP 105/61  Pulse 89  Temp 98.5 F (36.9 C) (Oral)  Resp 20  Ht 5\' 9"  (1.753 m)  Wt 220 lb (99.791 kg)  BMI 32.49 kg/m2  SpO2 100%  LMP 08/24/2012  Physical Exam  Nursing note and vitals reviewed. Constitutional: She is oriented to person, place, and time. She appears well-developed and well-nourished. Distressed: in mild distress complaining of swelling in her hands and feet.  HENT:  Head: Normocephalic and atraumatic.  Right Ear: External ear normal.  Left Ear: External ear normal.  Mouth/Throat: Oropharynx is clear and moist.  Eyes: Conjunctivae and EOM are normal. Pupils are equal, round, and reactive to light.  Neck: Normal range of motion. Neck supple. No JVD  present. No thyromegaly present.  Cardiovascular: Normal rate, regular rhythm and normal heart sounds.   Pulmonary/Chest: Effort normal and breath sounds normal.  Abdominal: Soft.       She has a massively enlarged uterus, approximately 9 months pregnancy size.  Musculoskeletal: Normal range of motion.       She has 2+ edema in the hands and 4+ edema in her feet and ankles extending up to her knees.  Lymphadenopathy:    She has no cervical  adenopathy.  Neurological: She is alert and oriented to person, place, and time.       No sensory or motor deficit.   Skin: Skin is warm and dry.  Psychiatric: She has a normal mood and affect. Her behavior is normal.    ED Course  Procedures (including critical care time)   Labs Reviewed  CBC WITH DIFFERENTIAL  COMPREHENSIVE METABOLIC PANEL  URINALYSIS, ROUTINE W REFLEX MICROSCOPIC  URINE CULTURE  PRO B NATRIURETIC PEPTIDE  D-DIMER, QUANTITATIVE  PROTIME-INR  APTT  SAMPLE TO BLOOD BANK  TSH   9:43 PM Pt seen, physical exam performed.  Old charts reviewed, shows she has a known very large uterine fibroid, and has had significant anemia requiring transfusion in June and July 2013.  She has recently been placed on methimizole for hyperthyroidism.  She now is here with pronounced swelling of hands and feet, pain when she walks because of the swelling.  Lab workup ordered.   Date: 08/28/2012  Rate:88  Rhythm: normal sinus rhythm  QRS Axis: normal  Intervals: normal  ST/T Wave abnormalities: normal  Conduction Disutrbances:none  Narrative Interpretation: Normal EKG  Old EKG Reviewed: changes noted--Rate is slower than on tracing of 07/18/2012.  Results for orders placed during the hospital encounter of 08/28/12  CBC WITH DIFFERENTIAL      Component Value Range   WBC 7.2  4.0 - 10.5 K/uL   RBC 3.53 (*) 3.87 - 5.11 MIL/uL   Hemoglobin 7.5 (*) 12.0 - 15.0 g/dL   HCT 16.1 (*) 09.6 - 04.5 %   MCV 70.8 (*) 78.0 - 100.0 fL   MCH 21.2 (*) 26.0 - 34.0 pg   MCHC 30.0  30.0 - 36.0 g/dL   RDW 40.9 (*) 81.1 - 91.4 %   Platelets 340  150 - 400 K/uL   Neutrophils Relative 76  43 - 77 %   Lymphocytes Relative 16  12 - 46 %   Monocytes Relative 7  3 - 12 %   Eosinophils Relative 1  0 - 5 %   Basophils Relative 0  0 - 1 %   Neutro Abs 5.4  1.7 - 7.7 K/uL   Lymphs Abs 1.2  0.7 - 4.0 K/uL   Monocytes Absolute 0.5  0.1 - 1.0 K/uL   Eosinophils Absolute 0.1  0.0 - 0.7 K/uL   Basophils  Absolute 0.0  0.0 - 0.1 K/uL   RBC Morphology ROULEAUX    COMPREHENSIVE METABOLIC PANEL      Component Value Range   Sodium 135  135 - 145 mEq/L   Potassium 4.1  3.5 - 5.1 mEq/L   Chloride 100  96 - 112 mEq/L   CO2 25  19 - 32 mEq/L   Glucose, Bld 102 (*) 70 - 99 mg/dL   BUN 34 (*) 6 - 23 mg/dL   Creatinine, Ser 7.82 (*) 0.50 - 1.10 mg/dL   Calcium 9.6  8.4 - 95.6 mg/dL   Total Protein 8.7 (*) 6.0 - 8.3  g/dL   Albumin 2.3 (*) 3.5 - 5.2 g/dL   AST 25  0 - 37 U/L   ALT 14  0 - 35 U/L   Alkaline Phosphatase 218 (*) 39 - 117 U/L   Total Bilirubin 0.6  0.3 - 1.2 mg/dL   GFR calc non Af Amer 28 (*) >90 mL/min   GFR calc Af Amer 33 (*) >90 mL/min  URINALYSIS, ROUTINE W REFLEX MICROSCOPIC      Component Value Range   Color, Urine YELLOW  YELLOW   APPearance CLOUDY (*) CLEAR   Specific Gravity, Urine 1.025  1.005 - 1.030   pH 6.0  5.0 - 8.0   Glucose, UA NEGATIVE  NEGATIVE mg/dL   Hgb urine dipstick LARGE (*) NEGATIVE   Bilirubin Urine NEGATIVE  NEGATIVE   Ketones, ur NEGATIVE  NEGATIVE mg/dL   Protein, ur 644 (*) NEGATIVE mg/dL   Urobilinogen, UA 1.0  0.0 - 1.0 mg/dL   Nitrite NEGATIVE  NEGATIVE   Leukocytes, UA TRACE (*) NEGATIVE  PRO B NATRIURETIC PEPTIDE      Component Value Range   Pro B Natriuretic peptide (BNP) 617.9 (*) 0 - 125 pg/mL  D-DIMER, QUANTITATIVE      Component Value Range   D-Dimer, Quant 4.10 (*) 0.00 - 0.48 ug/mL-FEU  PROTIME-INR      Component Value Range   Prothrombin Time 14.2  11.6 - 15.2 seconds   INR 1.08  0.00 - 1.49  APTT      Component Value Range   aPTT 26  24 - 37 seconds  SAMPLE TO BLOOD BANK      Component Value Range   Blood Bank Specimen BBHLD     Sample Expiration 08/31/2012    URINE MICROSCOPIC-ADD ON      Component Value Range   Squamous Epithelial / LPF MANY (*) RARE   WBC, UA 11-20  <3 WBC/hpf   RBC / HPF 21-50  <3 RBC/hpf   Bacteria, UA MANY (*) RARE   Casts GRANULAR CAST (*) NEGATIVE  TYPE AND SCREEN      Component Value  Range   ABO/RH(D) O POS     Antibody Screen NEG     Sample Expiration 08/31/2012    CBC      Component Value Range   WBC 6.9  4.0 - 10.5 K/uL   RBC 3.21 (*) 3.87 - 5.11 MIL/uL   Hemoglobin 7.0 (*) 12.0 - 15.0 g/dL   HCT 03.4 (*) 74.2 - 59.5 %   MCV 70.7 (*) 78.0 - 100.0 fL   MCH 21.8 (*) 26.0 - 34.0 pg   MCHC 30.8  30.0 - 36.0 g/dL   RDW 63.8 (*) 75.6 - 43.3 %   Platelets 315  150 - 400 K/uL   Dg Chest 2 View  08/28/2012  *RADIOLOGY REPORT*  Clinical Data:  Extremity edema.  CHEST - 2 VIEW  Comparison: 06/11/2012  Findings: The heart size and mediastinal contours are within normal limits.  Both lungs are clear.  The visualized skeletal structures are unremarkable.  IMPRESSION: No active disease.   Original Report Authenticated By: Reola Calkins, M.D.    US Venous Img Lower Bilateral  08/29/2012  *RADIOLOGY REPORT*  Clinical Data: Leg swelling, pain.  Pelvic mass.  BILATERAL LOWER EXTREMITY VENOUS DOPPLER ULTRASOUND  Technique: Gray-scale sonography with compression, as well as color and duplex ultrasound, were performed to evaluate the deep venous system from the level of the common femoral vein through the popliteal  and proximal calf veins.  Comparison: CT 06/11/2012  Findings:  Normal compressibility of  the common femoral, superficial femoral, and popliteal veins, as well as the proximal calf veins.  No filling defects to suggest DVT on grayscale or color Doppler imaging.  Doppler waveforms show normal direction of venous flow, normal respiratory phasicity and response to augmentation.  IMPRESSION: No evidence of  lower extremity deep vein thrombosis.   Original Report Authenticated By: Osa Craver, M.D.     Pt signed out to Dr. Estell Harpin at 10:30 P.M.  He arranged pt's admission to [facility].  1. Uterine fibroid   2. Edema       [name], MD 08/29/12 1100

## 2012-08-29 ENCOUNTER — Inpatient Hospital Stay (HOSPITAL_COMMUNITY): Payer: 59

## 2012-08-29 ENCOUNTER — Encounter (HOSPITAL_COMMUNITY): Payer: Self-pay | Admitting: *Deleted

## 2012-08-29 DIAGNOSIS — N390 Urinary tract infection, site not specified: Secondary | ICD-10-CM | POA: Diagnosis present

## 2012-08-29 DIAGNOSIS — D259 Leiomyoma of uterus, unspecified: Secondary | ICD-10-CM

## 2012-08-29 LAB — T3, FREE: T3, Free: 2.4 pg/mL (ref 2.3–4.2)

## 2012-08-29 LAB — CBC
HCT: 22.7 % — ABNORMAL LOW (ref 36.0–46.0)
Hemoglobin: 7 g/dL — ABNORMAL LOW (ref 12.0–15.0)
MCHC: 30.8 g/dL (ref 30.0–36.0)
WBC: 6.9 10*3/uL (ref 4.0–10.5)

## 2012-08-29 LAB — PREPARE RBC (CROSSMATCH)

## 2012-08-29 LAB — T4, FREE: Free T4: 1.33 ng/dL (ref 0.80–1.80)

## 2012-08-29 MED ORDER — SODIUM CHLORIDE 0.9 % IJ SOLN
INTRAMUSCULAR | Status: AC
  Administered 2012-08-29: 10 mL
  Filled 2012-08-29: qty 3

## 2012-08-29 MED ORDER — ALPRAZOLAM 0.5 MG PO TABS
0.5000 mg | ORAL_TABLET | Freq: Every evening | ORAL | Status: DC | PRN
  Administered 2012-08-31: 0.5 mg via ORAL
  Filled 2012-08-29: qty 1

## 2012-08-29 MED ORDER — ONDANSETRON HCL 4 MG PO TABS
4.0000 mg | ORAL_TABLET | Freq: Four times a day (QID) | ORAL | Status: DC | PRN
  Administered 2012-08-30: 4 mg via ORAL
  Filled 2012-08-29: qty 1

## 2012-08-29 MED ORDER — ATENOLOL 25 MG PO TABS
50.0000 mg | ORAL_TABLET | Freq: Every day | ORAL | Status: DC
  Administered 2012-08-29: 50 mg via ORAL
  Filled 2012-08-29: qty 2

## 2012-08-29 MED ORDER — DOCUSATE SODIUM 100 MG PO CAPS
100.0000 mg | ORAL_CAPSULE | Freq: Two times a day (BID) | ORAL | Status: DC
  Administered 2012-08-29 – 2012-09-01 (×7): 100 mg via ORAL
  Filled 2012-08-29 (×11): qty 1

## 2012-08-29 MED ORDER — DEXTROSE 5 % IV SOLN
INTRAVENOUS | Status: AC
  Filled 2012-08-29: qty 10

## 2012-08-29 MED ORDER — ATENOLOL 25 MG PO TABS
25.0000 mg | ORAL_TABLET | Freq: Every day | ORAL | Status: DC
  Administered 2012-08-30 – 2012-09-01 (×3): 25 mg via ORAL
  Filled 2012-08-29 (×3): qty 1

## 2012-08-29 MED ORDER — ALUM & MAG HYDROXIDE-SIMETH 200-200-20 MG/5ML PO SUSP: 30.0000 mL | Freq: Four times a day (QID) | ORAL | Status: DC | PRN

## 2012-08-29 MED ORDER — DEXTROSE 5 % IV SOLN
1.0000 g | INTRAVENOUS | Status: DC
  Administered 2012-08-29 – 2012-09-01 (×4): 1 g via INTRAVENOUS
  Filled 2012-08-29 (×5): qty 10

## 2012-08-29 MED ORDER — OXYCODONE HCL 5 MG PO TABS
5.0000 mg | ORAL_TABLET | ORAL | Status: DC | PRN
  Administered 2012-08-29 – 2012-08-31 (×8): 5 mg via ORAL
  Filled 2012-08-29 (×8): qty 1

## 2012-08-29 MED ORDER — ENOXAPARIN SODIUM 40 MG/0.4ML ~~LOC~~ SOLN
40.0000 mg | SUBCUTANEOUS | Status: DC
  Administered 2012-08-29 – 2012-09-01 (×4): 40 mg via SUBCUTANEOUS
  Filled 2012-08-29 (×4): qty 0.4

## 2012-08-29 MED ORDER — ONDANSETRON HCL 4 MG/2ML IJ SOLN
4.0000 mg | Freq: Four times a day (QID) | INTRAMUSCULAR | Status: DC | PRN
  Administered 2012-08-29: 4 mg via INTRAVENOUS
  Filled 2012-08-29: qty 2

## 2012-08-29 MED ORDER — MORPHINE SULFATE 2 MG/ML IJ SOLN
2.0000 mg | INTRAMUSCULAR | Status: DC | PRN
  Administered 2012-08-29 – 2012-08-31 (×6): 2 mg via INTRAVENOUS
  Filled 2012-08-29 (×6): qty 1

## 2012-08-29 MED ORDER — HYDROCOD POLST-CHLORPHEN POLST 10-8 MG/5ML PO LQCR
5.0000 mL | Freq: Two times a day (BID) | ORAL | Status: DC | PRN
  Administered 2012-08-29 – 2012-08-31 (×3): 5 mL via ORAL
  Filled 2012-08-29 (×3): qty 5

## 2012-08-29 NOTE — Progress Notes (Signed)
Initial skin assessment by Lawson Fiscal RN and Pernell Dupre RN.

## 2012-08-29 NOTE — Progress Notes (Signed)
Patient has a 24 hour urine. There will be blood in her urine due to her menses.

## 2012-08-29 NOTE — H&P (Addendum)
[facility] History and Physical  [name] ZOX:[id number] DOB: May 17, 1971 DOA: 08/28/2012  Referring physician: EDP Zammit PCP: Kirk Ruths, MD   Chief Complaint: Swelling in Legs  HPI: [name] is a 41 y.o. female with Graves Disease (currently on methimazole), Chronic Kidney Disease and chronic blood loss anemia from a massive fibroid uterus who presented to the ED complaining of severe fatigue, severe lower extremity swelling and abdominal pain. She is followed by Dr. Hoyle Barr in [place] who has been managing her care. Per the patient, surgery has been delayed due to her thyroid disease and the size of the tumor, there has been discussion about her being evaluated at [facility] due to the size of her uterus/fibroid mass and the potential for complications given her kidney disease and thyroid disease. The patient has required several blood transfusions in the past for severe anemia related to menorrhagia. She currently denies any chest pain, has SOB on exertion, generalized discomfort related to her abdominal mass- her biggest concern and what brought her into teh ED was acutely worsening peripheral edema, worse in her legs, but also in her hands.  Past Medical History  Diagnosis Date  . Thyroid disease   . Fibroid tumor    History reviewed. No pertinent past surgical history.  Social History:  reports that she has never smoked. She does not have any smokeless tobacco history on file. She reports that she does not drink alcohol or use illicit drugs.   No Known Allergies  History reviewed. No pertinent family history. Family history of thyroid disorder.  Prior to Admission medications   Medication Sig Start Date End Date Taking? Authorizing Provider  atenolol (TENORMIN) 50 MG tablet Take 50 mg by mouth daily.   Yes Historical Provider, MD  Ferrous Sulfate (IRON) 325 (65 FE) MG TABS Take 1 tablet by mouth daily.   Yes Historical Provider, MD  methimazole  (TAPAZOLE) 5 MG tablet Take 5 mg by mouth 2 (two) times daily.   Yes Historical Provider, MD   Physical Exam: Filed Vitals:   08/28/12 2117  BP: 105/61  Pulse: 89  Temp: 98.5 F (36.9 C)  TempSrc: Oral  Resp: 20  Height: 5\' 9"  (1.753 m)  Weight: 99.791 kg (220 lb)  SpO2: 100%     General:  Ill, weak appearing woman, mild distress  Eyes: exopthalmos/proptosis, EOMI  ENT: dry MM, poor dentitin  Neck: medium size goiter  Cardiovascular: RRR, no mrg  Respiratory: CTAB  Abdomen: very large, gravid appearing abd with uterus palpable well above the umblicus, mild tender to palpation  Skin: tense 3-4+ pitting edema in Lower extremities bilaterally, 1+ pitting edema in her hands bilaterally  Musculoskeletal: no joint swelling, moves all extremities equally  Psychiatric: slightly depressed affect  Neurologic: non-focal  Labs on Admission:  Basic Metabolic Panel:  Lab 08/28/12 8119  NA 135  K 4.1  CL 100  CO2 25  GLUCOSE 102*  BUN 34*  CREATININE 2.07*  CALCIUM 9.6  MG --  PHOS --   Liver Function Tests:  Lab 08/28/12 2145  AST 25  ALT 14  ALKPHOS 218*  BILITOT 0.6  PROT 8.7*  ALBUMIN 2.3*   CBC:  Lab 08/28/12 2145  WBC 7.2  NEUTROABS 5.4  HGB 7.5*  HCT 25.0*  MCV 70.8*  PLT 340    BNP (last 3 results)  Basename 08/28/12 2145  PROBNP 617.9*   Radiological Exams on Admission: Dg Chest 2 View  08/28/2012  *RADIOLOGY REPORT*  Clinical Data:  Extremity edema.  CHEST - 2 VIEW  Comparison: 06/11/2012  Findings: The heart size and mediastinal contours are within normal limits.  Both lungs are clear.  The visualized skeletal structures are unremarkable.  IMPRESSION: No active disease.   Original Report Authenticated By: Reola Calkins, M.D.     EKG: Independently reviewed.   Assessment/Plan Active Problems:  Fibroid uterus  Lymphedema  Graves disease  Chronic blood loss anemia  Chronic kidney disease  Positive D dimer  Menometrorrhagia   UTI (lower urinary tract infection)   1. Peripheral edema, quite severe- lower extremity edema may be related to lymphedema from obstruction related to mass effect of uterus, nephrotic syndrome also very high on differential list since the etiology of her renal failure is largely unknown. Large total protein/albumin dissociation. Also has elevated BNP, no prior evaluation for CHF. Edema is pitting, so doubt myxedema or thyroid related.   24 hour Urine for Protienuria, SPEP and UPEP, UA 100+  Renal US/Complete ABD [place]  2D echo in setting of elevated BNP, peripheral edema, consider diuresis if diastolic failure  2.  Anemia, Chronic Blood Loss from Menorrhagia  Typed and Screened, not actively bleeding, if AM CBC <7 will transfuse  3. Fibroid Uterine Mass, very large, likely causing significant mass effect in abdomen including IVC compression, possible urinary retention, lymphatic compression, while certainly not emergent this patient needs to have surgical intervention soon- despite her medical co-morbidities which are chronic and not a contraindication to this much needed surgery. The patient is requesting possible transfer to [facility] for definitive management. Dr. Emelda Fear following, I spoke with the on-call GYN, Dr. Debroah Loop at [facility] who would not accept the patient in transfer due to the non-acute nature of her presentation, he felt she would best be managed by Dr. Emelda Fear here in [place] or that a transfer to [facility] would be more appropriate given the size of the mass and possible surgical complexity. Will need to contact [facility] or call coverage in AM.  4.  Graves Disease, on Methimazole, patient has extremity swelling and elevated Alkaline Phos and Renal failure which are possible toxicities and contraindication to using Methimazole, this has been discontinued. She will need Endocrinology evaluation and management for her Graves Disease and possible ablation.   Free T3, Free T4 and  TSH all pending  Discontinued Methimazole  5. Chronic Renal Failure, baseline Scr is around 2.0 and has been stable for several months, the etiology of her renal failure is unknown, and per records and patient has not been evaluated or worked up in detail. Concern for nephrotic syndrome, work-up above.   6. Elevated D. Dimer, no hypoxia or tachycardia or chest pain, doubt PE, will obtain LE dopplers, if she is nephrotic she may be hypercoagulable, may also be related to uterine mass if internal hemorrhage has occurred and significant clotting present.  7. UTI/Pyuria  Empiric Rocephin  Code Status: Full Code Family Communication: Discussed plan of care with patient Disposition Plan: Discharge home when medically stable or transfer to [facility] for definitive management/patient request.  Time spent: 70 min  [facility] Pager [phone]  If 7PM-7AM, please contact night-coverage [email or web address] Password [email or web address] 08/29/2012, 1:01 AM

## 2012-08-29 NOTE — Progress Notes (Signed)
[facility] PROGRESS NOTE  WILL SCHIER ZOX:[id number] DOB: [birth year] DOA: 08/28/2012 PCP: Kirk Ruths, MD  Assessment/Plan: Active Problems:  Fibroid uterus  Lymphedema  Graves disease  Chronic blood loss anemia  Chronic kidney disease  Positive D dimer  Menometrorrhagia  UTI (lower urinary tract infection)  1. Peripheral edema, quite severe- lower extremity edema may be related to lymphedema from obstruction related to mass effect of uterus, nephrotic syndrome also very high on differential list since the etiology of her renal failure is largely unknown. Large total protein/albumin dissociation. Also has elevated BNP, no prior evaluation for CHF. Edema is pitting, so doubt myxedema or thyroid related. 24 hour Urine for Protienuria, SPEP and UPEP, UA 100+  Renal US/Complete ABD [place] does not show hydronephrosis.  2D echo in setting of elevated BNP, peripheral edema, consider diuresis if diastolic failure 2. Anemia, Chronic Blood Loss from Menorrhagia  Typed and Screened, actively bleeding, and will transfuse 2 units PRBC. Repeat CBC in am.  3. Fibroid Uterine Mass, very large, likely causing significant mass effect in abdomen including IVC compression, possible urinary retention, lymphatic compression, while certainly not emergent this patient needs to have surgical intervention soon- despite her medical co-morbidities which are chronic and not a contraindication to this much needed surgery. The patient is requesting possible transfer to [facility] for definitive management. Dr. Emelda Fear following, I spoke with the on-call GYN, Dr. Debroah Loop at [facility] who would not accept the patient in transfer due to the non-acute nature of her presentation, he felt she would best be managed by Dr. Emelda Fear here in [place] or that a transfer to [facility] would be more appropriate given the size of the mass and possible surgical complexity. Spoke to dr Shawnie Pons for consult.  4. Graves Disease, on  Methimazole, patient has extremity swelling and elevated Alkaline Phos and Renal failure which are possible toxicities and contraindication to using Methimazole, this has been discontinued. She will need Endocrinology evaluation and management for her Graves Disease and possible ablation.  Free T3, Free T4 and TSH all pending  Discontinued Methimazole 5. Chronic Renal Failure, baseline Scr is around 2.0 and has been stable for several months, the etiology of her renal failure is unknown, and per records and patient has not been evaluated or worked up in detail. Concern for nephrotic syndrome, work-up above.  6. Elevated D. Dimer, no hypoxia or tachycardia or chest pain, doubt PE, LE dopplers neg for DVT., if she is nephrotic she may be hypercoagulable, may also be related to uterine mass if internal hemorrhage has occurred and significant clotting present.  7. UTI/Pyuria  Empiric Rocephin      HPI/Subjective: SLIGHTLY ANXIOUS.   Objective: Filed Vitals:   08/29/12 0530 08/29/12 0900 08/29/12 1318 08/29/12 1353  BP: 108/73 108/74 99/63 97/63   Pulse: 84 85 78 79  Temp: 98.3 F (36.8 C)  98.3 F (36.8 C) 98.2 F (36.8 C)  TempSrc:    Oral  Resp: 20  18 20   Height:      Weight:      SpO2: 99%  99%     Intake/Output Summary (Last 24 hours) at 08/29/12 1427 Last data filed at 08/29/12 1353  Gross per 24 hour  Intake  867.5 ml  Output    200 ml  Net  667.5 ml   Filed Weights   08/28/12 2117 08/29/12 0120  Weight: 99.791 kg (220 lb) 103.1 kg (227 lb 4.7 oz)    Exam:   General:  Alert febrile  Cardiovascular: s1s2  Respiratory: CTAB  Abdomen: soft NT ND BS+  Data Reviewed: Basic Metabolic Panel:  Lab 08/28/12 1610  NA 135  K 4.1  CL 100  CO2 25  GLUCOSE 102*  BUN 34*  CREATININE 2.07*  CALCIUM 9.6  MG --  PHOS --   Liver Function Tests:  Lab 08/28/12 2145  AST 25  ALT 14  ALKPHOS 218*  BILITOT 0.6  PROT 8.7*  ALBUMIN 2.3*   No results found for  this basename: LIPASE:5,AMYLASE:5 in the last 168 hours No results found for this basename: AMMONIA:5 in the last 168 hours CBC:  Lab 08/29/12 0602 08/28/12 2145  WBC 6.9 7.2  NEUTROABS -- 5.4  HGB 7.0* 7.5*  HCT 22.7* 25.0*  MCV 70.7* 70.8*  PLT 315 340   Cardiac Enzymes: No results found for this basename: CKTOTAL:5,CKMB:5,CKMBINDEX:5,TROPONINI:5 in the last 168 hours BNP (last 3 results)  Basename 08/28/12 2145  PROBNP 617.9*   CBG: No results found for this basename: GLUCAP:5 in the last 168 hours  No results found for this or any previous visit (from the past 240 hour(s)).   Studies: Dg Chest 2 View  08/28/2012  *RADIOLOGY REPORT*  Clinical Data:  Extremity edema.  CHEST - 2 VIEW  Comparison: 06/11/2012  Findings: The heart size and mediastinal contours are within normal limits.  Both lungs are clear.  The visualized skeletal structures are unremarkable.  IMPRESSION: No active disease.   Original Report Authenticated By: Reola Calkins, M.D.    US Abdomen Complete  08/29/2012  *RADIOLOGY REPORT*  Clinical Data:  Elevated liver function tests.  Renal failure.  COMPLETE ABDOMINAL ULTRASOUND  Comparison:  CT dated 06/11/2012.  Findings:  Gallbladder:  No gallstones, gallbladder wall thickening, or pericholecystic fluid.  Common bile duct:  Normal, measuring 4.5 mm in diameter proximally.  Liver:  No focal lesion identified.  Within normal limits in parenchymal echogenicity.  IVC:  Appears normal.  Pancreas:  Obscured by overlying bowel gas.  Normal on the previous CT.  Spleen:  Normal, measuring 8.8 cm in length.  Right Kidney:  Borderline echogenic.  Otherwise, normal, measuring 10.5 cm in length.  No hydronephrosis.  Left Kidney:  Borderline echogenic.  Otherwise, normal, measuring 11.7 cm in length.  No hydronephrosis.  Abdominal aorta:  Normal in caliber at the level of the proximal and mid aorta.  The distal aorta is obscured by the previously demonstrated large uterine mass.   IMPRESSION:  1.  No acute abnormality. 2.  Borderline echogenic kidneys, suggesting mild medical renal disease. 3.  Obscured pancreas by overlying bowel gas. 4.  Obscured distal abdominal aorta by the previously demonstrated large uterine mass.   Original Report Authenticated By: Darrol Angel, M.D.    US Venous Img Lower Bilateral  08/29/2012  *RADIOLOGY REPORT*  Clinical Data: Leg swelling, pain.  Pelvic mass.  BILATERAL LOWER EXTREMITY VENOUS DOPPLER ULTRASOUND  Technique: Gray-scale sonography with compression, as well as color and duplex ultrasound, were performed to evaluate the deep venous system from the level of the common femoral vein through the popliteal and proximal calf veins.  Comparison: CT 06/11/2012  Findings:  Normal compressibility of  the common femoral, superficial femoral, and popliteal veins, as well as the proximal calf veins.  No filling defects to suggest DVT on grayscale or color Doppler imaging.  Doppler waveforms show normal direction of venous flow, normal respiratory phasicity and response to augmentation.  IMPRESSION: No evidence of  lower extremity deep vein  thrombosis.   Original Report Authenticated By: Thora Lance III, M.D.     Scheduled Meds:   . sodium chloride   Intravenous Once  . atenolol  25 mg Oral Daily  . cefTRIAXone (ROCEPHIN)  IV  1 g Intravenous Q24H  . docusate sodium  100 mg Oral BID  . enoxaparin (LOVENOX) injection  40 mg Subcutaneous Q24H  . sodium chloride      . DISCONTD: atenolol  50 mg Oral Daily   Continuous Infusions:   Active Problems:  Fibroid uterus  Lymphedema  Graves disease  Chronic blood loss anemia  Chronic kidney disease  Positive D dimer  Menometrorrhagia  UTI (lower urinary tract infection)        [name]  [facility] Pager [phone]. If 8PM-8AM, please contact night-coverage at [email or web address], password [facility] 08/29/2012, 2:27 PM  LOS: 1 day

## 2012-08-30 DIAGNOSIS — N926 Irregular menstruation, unspecified: Secondary | ICD-10-CM

## 2012-08-30 DIAGNOSIS — D500 Iron deficiency anemia secondary to blood loss (chronic): Secondary | ICD-10-CM

## 2012-08-30 DIAGNOSIS — R609 Edema, unspecified: Secondary | ICD-10-CM

## 2012-08-30 DIAGNOSIS — E0500 Thyrotoxicosis with diffuse goiter without thyrotoxic crisis or storm: Secondary | ICD-10-CM

## 2012-08-30 LAB — BASIC METABOLIC PANEL
CO2: 25 mEq/L (ref 19–32)
Calcium: 9.2 mg/dL (ref 8.4–10.5)
GFR calc non Af Amer: 30 mL/min — ABNORMAL LOW (ref >90)
Potassium: 4 mEq/L (ref 3.5–5.1)
Sodium: 132 mEq/L — ABNORMAL LOW (ref 135–145)

## 2012-08-30 LAB — PROTEIN, URINE, 24 HOUR: Protein, Urine: 159 mg/dL

## 2012-08-30 LAB — CBC
Hemoglobin: 9.2 g/dL — ABNORMAL LOW (ref 12.0–15.0)
MCH: 23.4 pg — ABNORMAL LOW (ref 26.0–34.0)
MCHC: 31.6 g/dL (ref 30.0–36.0)
Platelets: 324 10*3/uL (ref 150–400)
RBC: 3.93 MIL/uL (ref 3.87–5.11)

## 2012-08-30 LAB — URINE CULTURE

## 2012-08-30 MED ORDER — LEUPROLIDE ACETATE 3.75 MG IM KIT
3.7500 mg | PACK | Freq: Once | INTRAMUSCULAR | Status: DC
  Filled 2012-08-30: qty 3.75

## 2012-08-30 MED ORDER — SODIUM CHLORIDE 0.9 % IJ SOLN
INTRAMUSCULAR | Status: AC
  Administered 2012-08-30: 17:00:00
  Filled 2012-08-30: qty 3

## 2012-08-30 MED ORDER — SODIUM CHLORIDE 0.9 % IJ SOLN
INTRAMUSCULAR | Status: AC
  Administered 2012-08-30: 18:00:00
  Filled 2012-08-30: qty 3

## 2012-08-30 MED ORDER — FUROSEMIDE 10 MG/ML IJ SOLN
20.0000 mg | Freq: Once | INTRAMUSCULAR | Status: AC
  Administered 2012-08-30: 20 mg via INTRAVENOUS
  Filled 2012-08-30: qty 2

## 2012-08-30 MED ORDER — ACETAMINOPHEN 325 MG PO TABS
650.0000 mg | ORAL_TABLET | Freq: Once | ORAL | Status: AC
  Administered 2012-08-30: 650 mg via ORAL
  Filled 2012-08-30: qty 2

## 2012-08-30 MED ORDER — SODIUM CHLORIDE 0.9 % IJ SOLN
INTRAMUSCULAR | Status: AC
  Administered 2012-08-30: 10 mL
  Filled 2012-08-30: qty 3

## 2012-08-30 MED ORDER — DIPHENHYDRAMINE HCL 25 MG PO CAPS
25.0000 mg | ORAL_CAPSULE | Freq: Once | ORAL | Status: AC
  Administered 2012-08-30: 25 mg via ORAL
  Filled 2012-08-30: qty 1

## 2012-08-30 MED ORDER — ENSURE COMPLETE PO LIQD
237.0000 mL | Freq: Every day | ORAL | Status: DC
  Administered 2012-08-31 – 2012-09-01 (×2): 237 mL via ORAL

## 2012-08-30 NOTE — Progress Notes (Signed)
[facility] PROGRESS NOTE  [name] VHQ:[id number] DOB: 1971/02/06 DOA: 08/28/2012 PCP: Kirk Ruths, MD  Assessment/Plan: Active Problems:  Fibroid uterus  Lymphedema  Graves disease  Chronic blood loss anemia  Chronic kidney disease  Positive D dimer  Menometrorrhagia  UTI (lower urinary tract infection)  1. Peripheral edema, quite severe- lower extremity edema may be related to lymphedema from obstruction related to mass effect of uterus, nephrotic syndrome also very high on differential list since the etiology of her renal failure is largely unknown. Large total protein/albumin dissociation. Also has elevated BNP, no prior evaluation for CHF. Edema is pitting, so doubt myxedema or thyroid related. 24 hour Urine for Protienuria, SPEP and UPEP, UA 100+  Renal US/Complete ABD [place] does not show hydronephrosis.  2D echo in setting of elevated BNP, peripheral edema, consider diuresis if diastolic failure.    2. Anemia, Chronic Blood Loss from Menorrhagia  Typed and Screened, actively bleeding, and will transfuse 2 MORE UNITS of units PRBC, a total of 4 units of prbc. Repeat CBC in am.    3. Fibroid Uterine Mass, very large, likely causing significant mass effect in abdomen including IVC compression, possible urinary retention, lymphatic compression, while certainly not emergent this patient needs to have surgical intervention soon- despite her medical co-morbidities which are chronic and not a contraindication to this much needed surgery.  Spoke to Dr Emelda Fear today, plan for surgery later on, after her bleeding is controlled and her hyperthyroidism is controlled. She wll be getting LUPRON injection before discharge.    4. Graves Disease, on Methimazole, patient has extremity swelling and elevated Alkaline Phos and Renal failure which are possible toxicities and contraindication to using Methimazole, this has been discontinued. She will need Endocrinology evaluation and  management for her Graves Disease and possible ablation.  Free T3, Free T4 and TSH all within normal limits. And she is not in thyrotoxicosis at this time.   Methimazole was discontinued on admission.  Ordered endocrinology consult in [facility] from Dr Fransico Him.  5. Chronic Renal Failure, baseline Scr is around 2.0 and has been stable for several months, the etiology of her renal failure is unknown, and per records and patient has not been evaluated or worked up in detail. Concern for nephrotic syndrome, work-up above.   6. Elevated D. Dimer, no hypoxia or tachycardia or chest pain, doubt PE, LE dopplers neg for DVT., if she is nephrotic she may be hypercoagulable, may also be related to uterine mass if internal hemorrhage has occurred and significant clotting present.   7. UTI/Pyuria  Empiric Rocephin Urine cultures are pending.      HPI/Subjective:  ANXIOUS.   Objective: Filed Vitals:   08/29/12 1922 08/29/12 2032 08/30/12 0551 08/30/12 0855  BP: 116/74 112/69 117/77   Pulse: 86 84 85   Temp: 99.3 F (37.4 C) 98.3 F (36.8 C) 98.7 F (37.1 C)   TempSrc: Oral Oral Oral   Resp: 18 18 18    Height:      Weight:    105.4 kg (232 lb 5.8 oz)  SpO2:  97% 95%     Intake/Output Summary (Last 24 hours) at 08/30/12 1041 Last data filed at 08/30/12 0500  Gross per 24 hour  Intake   2180 ml  Output    975 ml  Net   1205 ml   Filed Weights   08/28/12 2117 08/29/12 0120 08/30/12 0855  Weight: 99.791 kg (220 lb) 103.1 kg (227 lb 4.7 oz) 105.4 kg (232 lb  5.8 oz)    Exam:   General:  Alert febrile  Cardiovascular: s1s2  Respiratory: CTAB  Abdomen: soft NT ND BS+  Extremities: pitting edema all 4 extremities   Data Reviewed: Basic Metabolic Panel:  Lab 08/30/12 9604 08/28/12 2145  NA 132* 135  K 4.0 4.1  CL 98 100  CO2 25 25  GLUCOSE 102* 102*  BUN 24* 34*  CREATININE 1.95* 2.07*  CALCIUM 9.2 9.6  MG -- --  PHOS -- --   Liver Function Tests:  Lab 08/28/12 2145  AST  25  ALT 14  ALKPHOS 218*  BILITOT 0.6  PROT 8.7*  ALBUMIN 2.3*   No results found for this basename: LIPASE:5,AMYLASE:5 in the last 168 hours No results found for this basename: AMMONIA:5 in the last 168 hours CBC:  Lab 08/30/12 0553 08/29/12 0602 08/28/12 2145  WBC 9.3 6.9 7.2  NEUTROABS -- -- 5.4  HGB 9.2* 7.0* 7.5*  HCT 29.1* 22.7* 25.0*  MCV 74.0* 70.7* 70.8*  PLT 324 315 340   Cardiac Enzymes: No results found for this basename: CKTOTAL:5,CKMB:5,CKMBINDEX:5,TROPONINI:5 in the last 168 hours BNP (last 3 results)  Basename 08/28/12 2145  PROBNP 617.9*   CBG: No results found for this basename: GLUCAP:5 in the last 168 hours  No results found for this or any previous visit (from the past 240 hour(s)).   Studies: Dg Chest 2 View  08/28/2012  *RADIOLOGY REPORT*  Clinical Data:  Extremity edema.  CHEST - 2 VIEW  Comparison: 06/11/2012  Findings: The heart size and mediastinal contours are within normal limits.  Both lungs are clear.  The visualized skeletal structures are unremarkable.  IMPRESSION: No active disease.   Original Report Authenticated By: Reola Calkins, M.D.    US Abdomen Complete  08/29/2012  *RADIOLOGY REPORT*  Clinical Data:  Elevated liver function tests.  Renal failure.  COMPLETE ABDOMINAL ULTRASOUND  Comparison:  CT dated 06/11/2012.  Findings:  Gallbladder:  No gallstones, gallbladder wall thickening, or pericholecystic fluid.  Common bile duct:  Normal, measuring 4.5 mm in diameter proximally.  Liver:  No focal lesion identified.  Within normal limits in parenchymal echogenicity.  IVC:  Appears normal.  Pancreas:  Obscured by overlying bowel gas.  Normal on the previous CT.  Spleen:  Normal, measuring 8.8 cm in length.  Right Kidney:  Borderline echogenic.  Otherwise, normal, measuring 10.5 cm in length.  No hydronephrosis.  Left Kidney:  Borderline echogenic.  Otherwise, normal, measuring 11.7 cm in length.  No hydronephrosis.  Abdominal aorta:  Normal in  caliber at the level of the proximal and mid aorta.  The distal aorta is obscured by the previously demonstrated large uterine mass.  IMPRESSION:  1.  No acute abnormality. 2.  Borderline echogenic kidneys, suggesting mild medical renal disease. 3.  Obscured pancreas by overlying bowel gas. 4.  Obscured distal abdominal aorta by the previously demonstrated large uterine mass.   Original Report Authenticated By: Darrol Angel, M.D.    US Venous Img Lower Bilateral  08/29/2012  *RADIOLOGY REPORT*  Clinical Data: Leg swelling, pain.  Pelvic mass.  BILATERAL LOWER EXTREMITY VENOUS DOPPLER ULTRASOUND  Technique: Gray-scale sonography with compression, as well as color and duplex ultrasound, were performed to evaluate the deep venous system from the level of the common femoral vein through the popliteal and proximal calf veins.  Comparison: CT 06/11/2012  Findings:  Normal compressibility of  the common femoral, superficial femoral, and popliteal veins, as well as the proximal  calf veins.  No filling defects to suggest DVT on grayscale or color Doppler imaging.  Doppler waveforms show normal direction of venous flow, normal respiratory phasicity and response to augmentation.  IMPRESSION: No evidence of  lower extremity deep vein thrombosis.   Original Report Authenticated By: Thora Lance III, M.D.     Scheduled Meds:    . acetaminophen  650 mg Oral Once  . atenolol  25 mg Oral Daily  . cefTRIAXone (ROCEPHIN)  IV  1 g Intravenous Q24H  . diphenhydrAMINE  25 mg Oral Once  . docusate sodium  100 mg Oral BID  . enoxaparin (LOVENOX) injection  40 mg Subcutaneous Q24H  . furosemide  20 mg Intravenous Once  . leuprolide  3.75 mg Intramuscular Once  . sodium chloride      . sodium chloride      . sodium chloride      . DISCONTD: atenolol  50 mg Oral Daily   Continuous Infusions:   Active Problems:  Fibroid uterus  Lymphedema  Graves disease  Chronic blood loss anemia  Chronic kidney disease   Positive D dimer  Menometrorrhagia  UTI (lower urinary tract infection)        Saagar Tortorella  [facility] Pager [phone]. If 8PM-8AM, please contact night-coverage at [email or web address], password [facility] 08/30/2012, 10:41 AM  LOS: 2 days

## 2012-08-30 NOTE — Progress Notes (Signed)
Lupron not given. Dr. Emelda Fear and Pharmacist aware and rescheduled dose.

## 2012-08-30 NOTE — Progress Notes (Signed)
INITIAL ADULT NUTRITION ASSESSMENT Date: 08/30/2012   Time: 10:45 AM Reason for Assessment: MST Score = 5  ASSESSMENT: Female 41 y.o.  Dx:  Anemia, Fibroid Uterus, Hyperthyroidism, Recurrent Non-Compliance    Past Medical History  Diagnosis Date  . Thyroid disease   . Fibroid tumor     Scheduled Meds:   . acetaminophen  650 mg Oral Once  . atenolol  25 mg Oral Daily  . cefTRIAXone (ROCEPHIN)  IV  1 g Intravenous Q24H  . diphenhydrAMINE  25 mg Oral Once  . docusate sodium  100 mg Oral BID  . enoxaparin (LOVENOX) injection  40 mg Subcutaneous Q24H  . furosemide  20 mg Intravenous Once  . leuprolide  3.75 mg Intramuscular Once  . sodium chloride      . sodium chloride      . sodium chloride      . DISCONTD: atenolol  50 mg Oral Daily   Continuous Infusions:  PRN Meds:.ALPRAZolam, alum & mag hydroxide-simeth, chlorpheniramine-HYDROcodone, morphine injection, ondansetron (ZOFRAN) IV, ondansetron, oxyCODONE  Ht: 5\' 9"  (175.3 cm)  Wt: 232 lb 5.8 oz (105.4 kg)  Ideal Wt: 66.2 kg  % Ideal Wt: 160%  Usual Wt: 275 (April 2013 per pt) % Usual Wt: 84%  Body mass index is 34.31 kg/(m^2). Obesity Class I  Food/Nutrition Related Hx: Pt sitting on side of bed. Reports ~50# wt loss since April of this year. Nausea, vomiting and poor appetite. She's receiving Regular diet with excellent po intake currently: ate 100% of dinner last night. Recurrent hx of non-compliance noted.    CMP     Component Value Date/Time   NA 132* 08/30/2012 0553   K 4.0 08/30/2012 0553   CL 98 08/30/2012 0553   CO2 25 08/30/2012 0553   GLUCOSE 102* 08/30/2012 0553   BUN 24* 08/30/2012 0553   CREATININE 1.95* 08/30/2012 0553   CALCIUM 9.2 08/30/2012 0553   PROT 8.7* 08/28/2012 2145   ALBUMIN 2.3* 08/28/2012 2145   AST 25 08/28/2012 2145   ALT 14 08/28/2012 2145   ALKPHOS 218* 08/28/2012 2145   BILITOT 0.6 08/28/2012 2145   GFRNONAA 30* 08/30/2012 0553   GFRAA 35* 08/30/2012 0553    Intake/Output Summary (Last 24 hours)  at 08/30/12 1050 Last data filed at 08/30/12 0500  Gross per 24 hour  Intake   2180 ml  Output    975 ml  Net   1205 ml    Diet Order: General po 100% dinner last night  Supplements/Tube Feeding:  IVF:    Estimated Nutritional Needs:   Kcal:1650-1980 kcal/day Protein:75-90 gr/day Fluid:39ml/kcal/day  NUTRITION DIAGNOSIS: -Increased nutrient needs (NI-5.1).  Status: Ongoing  RELATED TO: fibroid uterine mass, peripheral edema, chronic blood loss  AS EVIDENCE BY: Large Fibroid uterine mass, severe wt loss of ~50#,16% in past 4 months  MONITORING/EVALUATION(Goals): Monitor meal and supplement intake evaluate adequacy, follow I/O's & wt trends Goal: Pt to meet >/= 90% of their estimated nutrition needs.   EDUCATION NEEDS: -No education needs identified at this time  INTERVENTION: Ensure Complete po daily, supplement provides 350 kcal and 13 grams of protein. RD to follow for nutritional needs  Dietitian [phone]  DOCUMENTATION CODES Per approved criteria  -Not Applicable    Francene Boyers 08/30/2012, 10:45 AM

## 2012-08-30 NOTE — Consult Note (Signed)
Reason for Consult:Anemia, Uterine fibroids, Hyperthyroidism Referring Physician: hospitalist service  [name] is an 41 y.o. female who is admitted to the hospitalist service here after presenting on Saturday with extreme fatigue and fluid gain. She has known uterine fibroids, 40 week size, equivalent to a term pregnancy in size. Initial diagnosis was in June and workup today has been notable for patient noncompliance with therapy, and the diagnosis of hyperthyroidism. Patient was originally recommended in June to receive Lupron 11.25 IM injection as outpatient but she chose not to get this prescription filled to alleged cost considerations, and was referred to [facility] per her request for second opinion. Patient did not keep that appointment, alleging she "didn't receive a call." On recent return to office, patient was placed on methimazole for the hyperthyroidism and Lupron again described. Patient again did not pick up the Lupron prescription, but did begin the methimazole. She did not keep her followup appointment last Friday to our office "that she was too sick", then presented to the emergency room Saturday and was admitted with hemoglobin 7.0, significant edema and weight gain. She began her menstrual period last Tuesday, 4 days PTA, and she describes the periods is "not that bad", with use of approximately 3 pads per day. She is currently still on her menses, which is longer than usual. Pertinent Gynecological History: Menses: flow is moderate Bleeding: Regular monthly Contraception: none she is a lifelong gravida 0 para 0 DES exposure: unknown Blood transfusions: Received 2 units in June, 4 units And 2 units this admission Sexually transmitted diseases: no past history Previous GYN Procedures: None, other than ultrasound  Last mammogram: Unknown Date:  Last pap: We'll check office records Date:  OB History: G0, P0   Menstrual History: Menarche age:  Patient's last menstrual  period was 08/24/2012., and is ongoing    Past Medical History  Diagnosis Date  . Thyroid disease   . Fibroid tumor     History reviewed. No pertinent past surgical history.  History reviewed. No pertinent family history.  Social History:  reports that she has never smoked. She has never used smokeless tobacco. She reports that she does not drink alcohol or use illicit drugs.  Allergies: No Known Allergies  Medications: Atenolol, multivitamins with iron (noncompliant), Lupron ordered but not picked up by patient as outpatient ROS  Blood pressure 117/77, pulse 85, temperature 98.7 F (37.1 C), temperature source Oral, resp. rate 18, height 5\' 9"  (1.753 m), weight 105.4 kg (232 lb 5.8 oz), last menstrual period 08/24/2012, SpO2 95.00%. Physical ExamPhysical Examination: General appearance - alert, well appearing, and in no distress, oriented to person, place, and time and anxious Mental status - alert, oriented to person, place, and time, depressed mood, affect appropriate to mood, patient expresses significant interested in having the surgery ASAP, but has been unwilling to comply with preparatory efforts and therapies. She alleges this is due 2 having never been sick before Abdomen - nontender with large fibroid mass reaching to the xiphoid. Prior ultrasounds document this is due to uterine enlargement measuring 25 cm in maximum diameter Pelvic - recent exam in office shows good pelvic support and huge fibroid uterus. Cervix difficult to access Extremities - pedal edema 4+ pretibial edema, edematous hands and fingers +, intact peripheral pulses, Homan's sign negative bilaterally  Results for orders placed during the hospital encounter of 08/28/12 (from the past 48 hour(s))  CBC WITH DIFFERENTIAL     Status: Abnormal   Collection Time   08/28/12  9:45 PM      Component Value Range Comment   WBC 7.2  4.0 - 10.5 K/uL    RBC 3.53 (*) 3.87 - 5.11 MIL/uL    Hemoglobin 7.5 (*) 12.0 - 15.0  g/dL    HCT 16.1 (*) 09.6 - 46.0 %    MCV 70.8 (*) 78.0 - 100.0 fL    MCH 21.2 (*) 26.0 - 34.0 pg    MCHC 30.0  30.0 - 36.0 g/dL    RDW 04.5 (*) 40.9 - 15.5 %    Platelets 340  150 - 400 K/uL    Neutrophils Relative 76  43 - 77 %    Lymphocytes Relative 16  12 - 46 %    Monocytes Relative 7  3 - 12 %    Eosinophils Relative 1  0 - 5 %    Basophils Relative 0  0 - 1 %    Neutro Abs 5.4  1.7 - 7.7 K/uL    Lymphs Abs 1.2  0.7 - 4.0 K/uL    Monocytes Absolute 0.5  0.1 - 1.0 K/uL    Eosinophils Absolute 0.1  0.0 - 0.7 K/uL    Basophils Absolute 0.0  0.0 - 0.1 K/uL    RBC Morphology ROULEAUX     COMPREHENSIVE METABOLIC PANEL     Status: Abnormal   Collection Time   08/28/12  9:45 PM      Component Value Range Comment   Sodium 135  135 - 145 mEq/L    Potassium 4.1  3.5 - 5.1 mEq/L    Chloride 100  96 - 112 mEq/L    CO2 25  19 - 32 mEq/L    Glucose, Bld 102 (*) 70 - 99 mg/dL    BUN 34 (*) 6 - 23 mg/dL    Creatinine, Ser 8.11 (*) 0.50 - 1.10 mg/dL    Calcium 9.6  8.4 - 91.4 mg/dL    Total Protein 8.7 (*) 6.0 - 8.3 g/dL    Albumin 2.3 (*) 3.5 - 5.2 g/dL    AST 25  0 - 37 U/L    ALT 14  0 - 35 U/L    Alkaline Phosphatase 218 (*) 39 - 117 U/L    Total Bilirubin 0.6  0.3 - 1.2 mg/dL    GFR calc non Af Amer 28 (*) >90 mL/min    GFR calc Af Amer 33 (*) >90 mL/min   PRO B NATRIURETIC PEPTIDE     Status: Abnormal   Collection Time   08/28/12  9:45 PM      Component Value Range Comment   Pro B Natriuretic peptide (BNP) 617.9 (*) 0 - 125 pg/mL   D-DIMER, QUANTITATIVE     Status: Abnormal   Collection Time   08/28/12  9:45 PM      Component Value Range Comment   D-Dimer, Quant 4.10 (*) 0.00 - 0.48 ug/mL-FEU   PROTIME-INR     Status: Normal   Collection Time   08/28/12  9:45 PM      Component Value Range Comment   Prothrombin Time 14.2  11.6 - 15.2 seconds    INR 1.08  0.00 - 1.49   APTT     Status: Normal   Collection Time   08/28/12  9:45 PM      Component Value Range Comment   aPTT  26  24 - 37 seconds   SAMPLE TO BLOOD BANK     Status: Normal   Collection Time  08/28/12  9:45 PM      Component Value Range Comment   Blood Bank Specimen BBHLD      Sample Expiration 08/31/2012     TSH     Status: Normal   Collection Time   08/28/12  9:45 PM      Component Value Range Comment   TSH 1.216  0.350 - 4.500 uIU/mL   TYPE AND SCREEN     Status: Normal   Collection Time   08/28/12  9:45 PM      Component Value Range Comment   ABO/RH(D) O POS      Antibody Screen NEG      Sample Expiration 08/31/2012      Unit Number [id number]      Blood Component Type RED CELLS,LR      Unit division 00      Status of Unit ISSUED,FINAL      Transfusion Status OK TO TRANSFUSE      Crossmatch Result Compatible      Unit Number [id number]      Blood Component Type RED CELLS,LR      Unit division 00      Status of Unit ISSUED,FINAL      Transfusion Status OK TO TRANSFUSE      Crossmatch Result Compatible     URINALYSIS, ROUTINE W REFLEX MICROSCOPIC     Status: Abnormal   Collection Time   08/28/12 10:36 PM      Component Value Range Comment   Color, Urine YELLOW  YELLOW    APPearance CLOUDY (*) CLEAR    Specific Gravity, Urine 1.025  1.005 - 1.030    pH 6.0  5.0 - 8.0    Glucose, UA NEGATIVE  NEGATIVE mg/dL    Hgb urine dipstick LARGE (*) NEGATIVE    Bilirubin Urine NEGATIVE  NEGATIVE    Ketones, ur NEGATIVE  NEGATIVE mg/dL    Protein, ur 469 (*) NEGATIVE mg/dL    Urobilinogen, UA 1.0  0.0 - 1.0 mg/dL    Nitrite NEGATIVE  NEGATIVE    Leukocytes, UA TRACE (*) NEGATIVE   URINE MICROSCOPIC-ADD ON     Status: Abnormal   Collection Time   08/28/12 10:36 PM      Component Value Range Comment   Squamous Epithelial / LPF MANY (*) RARE    WBC, UA 11-20  <3 WBC/hpf    RBC / HPF 21-50  <3 RBC/hpf    Bacteria, UA MANY (*) RARE    Casts GRANULAR CAST (*) NEGATIVE   CBC     Status: Abnormal   Collection Time   08/29/12  6:02 AM      Component Value Range Comment   WBC 6.9  4.0 -  10.5 K/uL    RBC 3.21 (*) 3.87 - 5.11 MIL/uL    Hemoglobin 7.0 (*) 12.0 - 15.0 g/dL    HCT 62.9 (*) 52.8 - 46.0 %    MCV 70.7 (*) 78.0 - 100.0 fL    MCH 21.8 (*) 26.0 - 34.0 pg    MCHC 30.8  30.0 - 36.0 g/dL    RDW 41.3 (*) 24.4 - 15.5 %    Platelets 315  150 - 400 K/uL   T3, FREE     Status: Normal   Collection Time   08/29/12  6:02 AM      Component Value Range Comment   T3, Free 2.4  2.3 - 4.2 pg/mL   T4, FREE     Status: Normal  Collection Time   08/29/12  6:02 AM      Component Value Range Comment   Free T4 1.33  0.80 - 1.80 ng/dL   PREPARE RBC (CROSSMATCH)     Status: Normal   Collection Time   08/29/12 12:30 PM      Component Value Range Comment   Order Confirmation ORDER PROCESSED BY BLOOD BANK     CBC     Status: Abnormal   Collection Time   08/30/12  5:53 AM      Component Value Range Comment   WBC 9.3  4.0 - 10.5 K/uL    RBC 3.93  3.87 - 5.11 MIL/uL    Hemoglobin 9.2 (*) 12.0 - 15.0 g/dL    HCT 16.1 (*) 09.6 - 46.0 %    MCV 74.0 (*) 78.0 - 100.0 fL    MCH 23.4 (*) 26.0 - 34.0 pg    MCHC 31.6  30.0 - 36.0 g/dL    RDW 04.5 (*) 40.9 - 15.5 %    Platelets 324  150 - 400 K/uL   BASIC METABOLIC PANEL     Status: Abnormal   Collection Time   08/30/12  5:53 AM      Component Value Range Comment   Sodium 132 (*) 135 - 145 mEq/L    Potassium 4.0  3.5 - 5.1 mEq/L    Chloride 98  96 - 112 mEq/L    CO2 25  19 - 32 mEq/L    Glucose, Bld 102 (*) 70 - 99 mg/dL    BUN 24 (*) 6 - 23 mg/dL    Creatinine, Ser 8.11 (*) 0.50 - 1.10 mg/dL    Calcium 9.2  8.4 - 91.4 mg/dL    GFR calc non Af Amer 30 (*) >90 mL/min    GFR calc Af Amer 35 (*) >90 mL/min     Dg Chest 2 View  08/28/2012  *RADIOLOGY REPORT*  Clinical Data:  Extremity edema.  CHEST - 2 VIEW  Comparison: 06/11/2012  Findings: The heart size and mediastinal contours are within normal limits.  Both lungs are clear.  The visualized skeletal structures are unremarkable.  IMPRESSION: No active disease.   Original Report  Authenticated By: Reola Calkins, M.D.    US Abdomen Complete  08/29/2012  *RADIOLOGY REPORT*  Clinical Data:  Elevated liver function tests.  Renal failure.  COMPLETE ABDOMINAL ULTRASOUND  Comparison:  CT dated 06/11/2012.  Findings:  Gallbladder:  No gallstones, gallbladder wall thickening, or pericholecystic fluid.  Common bile duct:  Normal, measuring 4.5 mm in diameter proximally.  Liver:  No focal lesion identified.  Within normal limits in parenchymal echogenicity.  IVC:  Appears normal.  Pancreas:  Obscured by overlying bowel gas.  Normal on the previous CT.  Spleen:  Normal, measuring 8.8 cm in length.  Right Kidney:  Borderline echogenic.  Otherwise, normal, measuring 10.5 cm in length.  No hydronephrosis.  Left Kidney:  Borderline echogenic.  Otherwise, normal, measuring 11.7 cm in length.  No hydronephrosis.  Abdominal aorta:  Normal in caliber at the level of the proximal and mid aorta.  The distal aorta is obscured by the previously demonstrated large uterine mass.  IMPRESSION:  1.  No acute abnormality. 2.  Borderline echogenic kidneys, suggesting mild medical renal disease. 3.  Obscured pancreas by overlying bowel gas. 4.  Obscured distal abdominal aorta by the previously demonstrated large uterine mass.   Original Report Authenticated By: Darrol Angel, M.D.    US  Venous Img Lower Bilateral  08/29/2012  *RADIOLOGY REPORT*  Clinical Data: Leg swelling, pain.  Pelvic mass.  BILATERAL LOWER EXTREMITY VENOUS DOPPLER ULTRASOUND  Technique: Gray-scale sonography with compression, as well as color and duplex ultrasound, were performed to evaluate the deep venous system from the level of the common femoral vein through the popliteal and proximal calf veins.  Comparison: CT 06/11/2012  Findings:  Normal compressibility of  the common femoral, superficial femoral, and popliteal veins, as well as the proximal calf veins.  No filling defects to suggest DVT on grayscale or color Doppler imaging.  Doppler  waveforms show normal direction of venous flow, normal respiratory phasicity and response to augmentation.  IMPRESSION: No evidence of  lower extremity deep vein thrombosis.   Original Report Authenticated By: Osa Craver, M.D.     Assessment/Plan: 1. Anemia, partially corrected status post 2 units PRBCs 2.  hyperthyroidism, recently initiated on methimazole, discontinued upon admission  3. Huge fibroid uterus needing removal once patient stable 4. Recurrent patient noncompliance with outpatient therapies Plan. 1 transfuse 2 units packed cells today with Lasix between units 2 recommend consult Dr. Fransico Him, who will be available tomorrow 3. We'll administer Lupron while patient is in hospital, to suppress bleeding and begin uterine shrinkage to make surgical option technically more accessible, with consideration of referral or transfer to [facility] 4 check office records for Pap smear Bryonna Sundby V 08/30/2012

## 2012-08-30 NOTE — Progress Notes (Signed)
Dr. Blake Divine notified of patient's request for fluid medication. Dr. Blake Divine stated, " We will wait for results of 2D echo." Patient made aware.

## 2012-08-31 DIAGNOSIS — I519 Heart disease, unspecified: Secondary | ICD-10-CM

## 2012-08-31 DIAGNOSIS — N189 Chronic kidney disease, unspecified: Secondary | ICD-10-CM

## 2012-08-31 LAB — TYPE AND SCREEN
ABO/RH(D): O POS
Unit division: 0
Unit division: 0

## 2012-08-31 LAB — CBC WITH DIFFERENTIAL/PLATELET
Basophils Absolute: 0 10*3/uL (ref 0.0–0.1)
Basophils Relative: 0 % (ref 0–1)
Eosinophils Absolute: 0.1 10*3/uL (ref 0.0–0.7)
Eosinophils Relative: 1 % (ref 0–5)
Lymphocytes Relative: 9 % — ABNORMAL LOW (ref 12–46)
MCHC: 32.1 g/dL (ref 30.0–36.0)
MCV: 76.2 fL — ABNORMAL LOW (ref 78.0–100.0)
Platelets: 319 10*3/uL (ref 150–400)
RDW: 24.7 % — ABNORMAL HIGH (ref 11.5–15.5)
WBC: 12.2 10*3/uL — ABNORMAL HIGH (ref 4.0–10.5)

## 2012-08-31 LAB — URINALYSIS, ROUTINE W REFLEX MICROSCOPIC
Bilirubin Urine: NEGATIVE
Ketones, ur: NEGATIVE mg/dL
Leukocytes, UA: NEGATIVE
Nitrite: NEGATIVE
Protein, ur: NEGATIVE mg/dL
Urobilinogen, UA: 0.2 mg/dL (ref 0.0–1.0)
pH: 6 (ref 5.0–8.0)

## 2012-08-31 LAB — URINE MICROSCOPIC-ADD ON

## 2012-08-31 MED ORDER — ALPRAZOLAM 1 MG PO TABS
1.0000 mg | ORAL_TABLET | Freq: Every evening | ORAL | Status: DC | PRN
  Administered 2012-08-31: 1 mg via ORAL
  Filled 2012-08-31: qty 1

## 2012-08-31 MED ORDER — FUROSEMIDE 10 MG/ML IJ SOLN
40.0000 mg | Freq: Once | INTRAMUSCULAR | Status: AC
  Administered 2012-08-31: 40 mg via INTRAVENOUS
  Filled 2012-08-31: qty 4

## 2012-08-31 MED ORDER — LEUPROLIDE ACETATE 3.75 MG IM KIT
3.7500 mg | PACK | Freq: Once | INTRAMUSCULAR | Status: AC
  Administered 2012-09-01: 3.75 mg via INTRAMUSCULAR
  Filled 2012-08-31: qty 3.75

## 2012-08-31 MED ORDER — SODIUM CHLORIDE 0.9 % IJ SOLN
INTRAMUSCULAR | Status: AC
  Administered 2012-08-31: 10 mL
  Filled 2012-08-31: qty 3

## 2012-08-31 MED ORDER — SODIUM CHLORIDE 0.9 % IJ SOLN
3.0000 mL | Freq: Two times a day (BID) | INTRAMUSCULAR | Status: DC
  Administered 2012-08-31 – 2012-09-01 (×3): 3 mL via INTRAVENOUS
  Filled 2012-08-31 (×2): qty 3

## 2012-08-31 NOTE — Consult Note (Signed)
  [id number] 

## 2012-08-31 NOTE — Progress Notes (Signed)
UR Chart Review Completed  

## 2012-08-31 NOTE — Consult Note (Signed)
Reason for Consult: Acute kidney injury superimposed on chronic Referring Physician: [name] is an 41 y.o. female.  HPI: She is a patient who is her history of her fibroid uterus and newly dagnisised Graves disease presently had came because of fatigue and generalized weakness. When she was evaluated patient was found to have anemia thought to be secondary to bleeding from her fibroid uterus. During this evaluation patient was found also to have high creatinine with leg edema and hence consult is called. Presently patient is a that she's feeling better she denies any difficulty breathing. She is able to walk at this moment. She complains of fullness from her abdominal mass. Patient denies any previous history of renal failure her history of kidney stone.  Past Medical History  Diagnosis Date  . Thyroid disease   . Fibroid tumor     History reviewed. No pertinent past surgical history.  History reviewed. No pertinent family history.  Social History:  reports that she has never smoked. She has never used smokeless tobacco. She reports that she does not drink alcohol or use illicit drugs.  Allergies: No Known Allergies  Medications: I have reviewed the patient's current medications.  Results for orders placed during the hospital encounter of 08/28/12 (from the past 48 hour(s))  CBC     Status: Abnormal   Collection Time   08/30/12  5:53 AM      Component Value Range Comment   WBC 9.3  4.0 - 10.5 K/uL    RBC 3.93  3.87 - 5.11 MIL/uL    Hemoglobin 9.2 (*) 12.0 - 15.0 g/dL    HCT 56.2 (*) 13.0 - 46.0 %    MCV 74.0 (*) 78.0 - 100.0 fL    MCH 23.4 (*) 26.0 - 34.0 pg    MCHC 31.6  30.0 - 36.0 g/dL    RDW 86.5 (*) 78.4 - 15.5 %    Platelets 324  150 - 400 K/uL   BASIC METABOLIC PANEL     Status: Abnormal   Collection Time   08/30/12  5:53 AM      Component Value Range Comment   Sodium 132 (*) 135 - 145 mEq/L    Potassium 4.0  3.5 - 5.1 mEq/L    Chloride 98  96 - 112  mEq/L    CO2 25  19 - 32 mEq/L    Glucose, Bld 102 (*) 70 - 99 mg/dL    BUN 24 (*) 6 - 23 mg/dL    Creatinine, Ser 6.96 (*) 0.50 - 1.10 mg/dL    Calcium 9.2  8.4 - 29.5 mg/dL    GFR calc non Af Amer 30 (*) >90 mL/min    GFR calc Af Amer 35 (*) >90 mL/min   PROTEIN, URINE, 24 HOUR     Status: Abnormal   Collection Time   08/30/12  2:58 PM      Component Value Range Comment   Urine Total Volume-UPROT 425      Collection Interval-UPROT 24      Protein, Urine 159      Protein, 24H Urine 676 (*) 50 - 100 mg/day   CBC WITH DIFFERENTIAL     Status: Abnormal   Collection Time   08/31/12  5:07 AM      Component Value Range Comment   WBC 12.2 (*) 4.0 - 10.5 K/uL    RBC 4.21  3.87 - 5.11 MIL/uL    Hemoglobin 10.3 (*) 12.0 - 15.0 g/dL  HCT 32.1 (*) 36.0 - 46.0 %    MCV 76.2 (*) 78.0 - 100.0 fL    MCH 24.5 (*) 26.0 - 34.0 pg    MCHC 32.1  30.0 - 36.0 g/dL    RDW 16.1 (*) 09.6 - 15.5 %    Platelets 319  150 - 400 K/uL    Neutrophils Relative 85 (*) 43 - 77 %    Neutro Abs 10.4 (*) 1.7 - 7.7 K/uL    Lymphocytes Relative 9 (*) 12 - 46 %    Lymphs Abs 1.1  0.7 - 4.0 K/uL    Monocytes Relative 5  3 - 12 %    Monocytes Absolute 0.6  0.1 - 1.0 K/uL    Eosinophils Relative 1  0 - 5 %    Eosinophils Absolute 0.1  0.0 - 0.7 K/uL    Basophils Relative 0  0 - 1 %    Basophils Absolute 0.0  0.0 - 0.1 K/uL     No results found.  Review of Systems  Respiratory: Negative for cough.   Cardiovascular: Positive for leg swelling.  Gastrointestinal: Positive for nausea and abdominal pain.  Genitourinary: Positive for flank pain.  Neurological: Positive for weakness.   Blood pressure 110/70, pulse 82, temperature 98.4 F (36.9 C), temperature source Oral, resp. rate 20, height 5\' 9"  (1.753 m), weight 106.6 kg (235 lb 0.2 oz), last menstrual period 08/24/2012, SpO2 94.00%. Physical Exam  Constitutional: She is oriented to person, place, and time.  Eyes: No scleral icterus.  Neck: JVD present.    Cardiovascular: Normal rate and regular rhythm.   No murmur heard. Respiratory: She has no wheezes. She has no rales.  GI: She exhibits distension and mass. There is tenderness.  Musculoskeletal: She exhibits edema.  Neurological: She is alert and oriented to person, place, and time.    Assessment/Plan: Problem #1 renal failure most likely acute on chronic. Initially on 06/11/2012 her creatinine was found to be around 2 and she had an ultrasound of the kidneys which showed right kidney to be 10.5 and left kidney 11.7 without hydro nephrosis. Patient shows some echogenicity there is no other findings. Her creatinine went down to 1.7 and finally to 0.9. On 06/2116 creatinine increased to 1.7 and elevated to present level of 1.9. The underlying problem for her renal failure is not clear. Problem #2 proteinuria urinalysis showed 100 mg/dL of protein and 04-VWUJ urine is 676 mg hence none nephrotic range. The hypoalbuminemia patient has most likely seems to be from poor nutrition. Problem #3 leg edema : Etiology not clear most likely secondary to mechanical blockage of blood and from enlarged fibroid uterus. Since patient has thyroid problem that may also contribute. Problem #4 anemia this is related to blood loss Problem #5 Graves' disease patient is presently evaluated by Dr. Fransico Him an endocrinologist. Problem #6 history of hypokalemia potassium as this moment has corrected. Plan: We'll continue his present management We'll check fractional excretion of sodium and urine for Hansell stain We'll check her UA tomorrow if it contains proteinuria will check ANA complement and hepatitis B surface antigen and antibody.  [name] S 08/31/2012, 2:15 PM

## 2012-08-31 NOTE — Progress Notes (Signed)
*  PRELIMINARY RESULTS* Echocardiogram 2D Echocardiogram has been performed.  Conrad Leonard 08/31/2012, 12:02 PM

## 2012-08-31 NOTE — Progress Notes (Signed)
     Subjective: This lady was admitted with severe anemia secondary to bleeding fibroids, which are large. She also has peripheral leg edema. Her left leg in particular is painful this morning. She has also a history of noncompliance.           Physical Exam: Blood pressure 110/70, pulse 82, temperature 98.4 F (36.9 C), temperature source Oral, resp. rate 20, height 5\' 9"  (1.753 m), weight 105.4 kg (232 lb 5.8 oz), last menstrual period 08/24/2012, SpO2 94.00%. She does look systemically well. She has exophthalmos consistent with her hyperthyroidism. She does have peripheral pitting edema of her legs. Heart sounds are present and normal. Lung fields are clear. She does not have clinical heart failure. Abdomen is soft and clearly I can feel a fibroid uterus. She is alert and orientated.   Investigations:  Recent Results (from the past 240 hour(s))  URINE CULTURE     Status: Normal   Collection Time   08/28/12 10:35 PM      Component Value Range Status Comment   Specimen Description URINE, CLEAN CATCH   Final    Special Requests NONE   Final    Culture  Setup Time 08/29/2012 21:42   Final    Colony Count >=100,000 COLONIES/ML   Final    Culture     Final    Value: Multiple bacterial morphotypes present, none predominant. Suggest appropriate recollection if clinically indicated.   Report Status 08/30/2012 FINAL   Final      Basic Metabolic Panel:  Basename 08/30/12 0553 08/28/12 2145  NA 132* 135  K 4.0 4.1  CL 98 100  CO2 25 25  GLUCOSE 102* 102*  BUN 24* 34*  CREATININE 1.95* 2.07*  CALCIUM 9.2 9.6  MG -- --  PHOS -- --   Liver Function Tests:  [facility] 08/28/12 2145  AST 25  ALT 14  ALKPHOS 218*  BILITOT 0.6  PROT 8.7*  ALBUMIN 2.3*     CBC:  Basename 08/31/12 0507 08/30/12 0553 08/28/12 2145  WBC 12.2* 9.3 --  NEUTROABS 10.4* -- 5.4  HGB 10.3* 9.2* --  HCT 32.1* 29.1* --  MCV 76.2* 74.0* --  PLT 319 324 --        Medications: I have  reviewed the patient's current medications.  Impression: 1. Anemia secondary to acute blood loss from fibroids. Status post 2 units blood transfusion. 2. Peripheral pitting leg edema, secondary to hypoalbuminemia. 3. Chronic kidney disease, unclear etiology. 4. Probable UTI. 5. Hyperthyroidism/Graves' disease.     Plan: 1. Intravenous Lasix. 2. Nephrology consultation. 3. Lupron injection to begin tomorrow. 4. Probable discharge home tomorrow.     LOS: 3 days   Wilson Singer Pager [phone]  08/31/2012, 11:13 AM

## 2012-08-31 NOTE — Care Management Note (Signed)
    Page 1 of 1   09/01/2012     1:17:40 PM   CARE MANAGEMENT NOTE 09/01/2012  Patient:  GRIER, CZERWINSKI   Account Number:  [id number]  Date Initiated:  08/31/2012  Documentation initiated by:  Sharrie Rothman  Subjective/Objective Assessment:   Pt admitted from home with anemia and peripheral edema. Pt lives with husband and will return home at discharge. Pt is independent with ADL's. Pt is being worked up by GYN surgery in near future.     Action/Plan:   No CM or HH needs noted.   Anticipated DC Date:  09/01/2012   Anticipated DC Plan:  HOME/SELF CARE      DC Planning Services  CM consult      Choice offered to / List presented to:             Status of service:  Completed, signed off Medicare Important Message given?   (If response is "NO", the following Medicare IM given date fields will be blank) Date Medicare IM given:   Date Additional Medicare IM given:    Discharge Disposition:  HOME/SELF CARE  Per UR Regulation:    If discussed at Long Length of Stay Meetings, dates discussed:    Comments:  09/01/12 1316 Arlyss Queen, RN BSN CM Pt discharged home today. No CM/HH needs noted.  08/31/12 0922 Daiki Dicostanzo,RN BSN CM

## 2012-09-01 DIAGNOSIS — I890 Lymphedema, not elsewhere classified: Secondary | ICD-10-CM

## 2012-09-01 LAB — UIFE/LIGHT CHAINS/TP QN, 24-HR UR
Albumin, U: DETECTED
Alpha 1, Urine: DETECTED — AB
Alpha 2, Urine: DETECTED — AB
Beta, Urine: DETECTED — AB
Free Kappa/Lambda Ratio: 7.89 ratio (ref 2.04–10.37)
Free Lt Chn Excr Rate: 1096.5 mg/d
Total Protein, Urine-Ur/day: 1302 mg/d — ABNORMAL HIGH (ref 10–140)
Total Protein, Urine: 306.4 mg/dL

## 2012-09-01 LAB — COMPREHENSIVE METABOLIC PANEL
ALT: 22 U/L (ref 0–35)
AST: 35 U/L (ref 0–37)
Calcium: 9.5 mg/dL (ref 8.4–10.5)
Creatinine, Ser: 1.93 mg/dL — ABNORMAL HIGH (ref 0.50–1.10)
GFR calc Af Amer: 36 mL/min — ABNORMAL LOW (ref >90)
GFR calc non Af Amer: 31 mL/min — ABNORMAL LOW (ref >90)
Sodium: 134 mEq/L — ABNORMAL LOW (ref 135–145)
Total Protein: 8.2 g/dL (ref 6.0–8.3)

## 2012-09-01 LAB — PROTEIN ELECTROPHORESIS, SERUM
Albumin ELP: 30.8 % — ABNORMAL LOW (ref 55.8–66.1)
Alpha-1-Globulin: 8.3 % — ABNORMAL HIGH (ref 2.9–4.9)
Alpha-2-Globulin: 14 % — ABNORMAL HIGH (ref 7.1–11.8)
Beta 2: 6.7 % — ABNORMAL HIGH (ref 3.2–6.5)
Beta Globulin: 3.8 % — ABNORMAL LOW (ref 4.7–7.2)
Gamma Globulin: 36.4 % — ABNORMAL HIGH (ref 11.1–18.8)

## 2012-09-01 LAB — CBC
MCH: 24.4 pg — ABNORMAL LOW (ref 26.0–34.0)
MCHC: 32 g/dL (ref 30.0–36.0)
MCV: 76.5 fL — ABNORMAL LOW (ref 78.0–100.0)
Platelets: 332 10*3/uL (ref 150–400)
RDW: 25 % — ABNORMAL HIGH (ref 11.5–15.5)

## 2012-09-01 MED ORDER — SODIUM CHLORIDE 0.9 % IJ SOLN
INTRAMUSCULAR | Status: AC
  Administered 2012-09-01: 3 mL
  Filled 2012-09-01: qty 3

## 2012-09-01 MED ORDER — OXYCODONE HCL 5 MG PO TABS: 5.0000 mg | ORAL_TABLET | ORAL | Status: AC | PRN

## 2012-09-01 MED ORDER — ALPRAZOLAM 1 MG PO TABS: 1.0000 mg | ORAL_TABLET | Freq: Every evening | ORAL | Status: AC | PRN

## 2012-09-01 MED ORDER — SODIUM CHLORIDE 0.9 % IJ SOLN
INTRAMUSCULAR | Status: AC
  Administered 2012-09-01: 01:00:00
  Filled 2012-09-01: qty 3

## 2012-09-01 MED ORDER — ATENOLOL 25 MG PO TABS: 50.0000 mg | ORAL_TABLET | Freq: Every day | ORAL | Status: DC

## 2012-09-01 NOTE — Discharge Summary (Signed)
Physician Discharge Summary  [name]:[id number] DOB: 06/16/1971 DOA: 08/28/2012  PCP: Kirk Ruths, MD  Admit date: 08/28/2012 Discharge date: 09/01/2012  Recommendations for Outpatient Follow-up:  1. Followup with Dr. Emelda Fear, OB/GYN. 2. Followup with nephrology, Dr Fausto Skillern. 3. Followup with endocrinology, Dr.Nida.   Discharge Diagnoses:  1. Symptomatic anemia from chronic bleeding from massive fibroid uterus. Status post 4 units blood transfusion. 2. Peripheral pitting edema, likely due to malnutrition and hypoalbuminemia. 3. Acute on chronic kidney disease, being investigated. 4. Graves' disease, will follow with endocrinology for further definitive treatment.   Discharge Condition: Stable.  Diet recommendation: Regular.  Filed Weights   08/29/12 0120 08/30/12 0855 08/31/12 1148  Weight: 103.1 kg (227 lb 4.7 oz) 105.4 kg (232 lb 5.8 oz) 106.6 kg (235 lb 0.2 oz)    History of present illness:  This 41 year old lady presented to the hospital with symptoms of swelling of her legs. Please see initial history and physical examination as outlined below.  [name] is a 41 y.o. female with Graves Disease (currently on methimazole), Chronic Kidney Disease and chronic blood loss anemia from a massive fibroid uterus who presented to the ED complaining of severe fatigue, severe lower extremity swelling and abdominal pain. She is followed by Dr. Hoyle Barr in [place] who has been managing her care. Per the patient, surgery has been delayed due to her thyroid disease and the size of the tumor, there has been discussion about her being evaluated at [facility] due to the size of her uterus/fibroid mass and the potential for complications given her kidney disease and thyroid disease. The patient has required several blood transfusions in the past for severe anemia related to menorrhagia. She currently denies any chest pain, has SOB on exertion, generalized discomfort related to  her abdominal mass- her biggest concern and what brought her into teh ED was acutely worsening peripheral edema, worse in her legs, but also in her hands.  Hospital Course:  Patient eventually required 4 units blood transfusion. She was seen by Dr. Emelda Fear, OB/GYN who has suggested that the fibroid needs to be shrunken with Lupron injections prior to surgery. She is due to receive her Lupron injection today prior to discharge. She was given some Lasix for the peripheral pitting edema. She has chronic kidney disease and this was being investigated by nephrology, Dr Fausto Skillern, who will follow her as an outpatient. In view of her kidney dysfunction, her methimazole was discontinued and her endocrinologist will call this in the outpatient setting. Her hemoglobin is improved from 7, and now it is 10.8. Echocardiogram was done in view of her edema and this was negative/normal for evidence of any significant left ventricular dysfunction. Procedures:  None.  Consultations:  OB/GYN, Dr. Emelda Fear.  Nephrology, Dr Fausto Skillern.  Endocrinology, Dr. Fransico Him.  Discharge Exam: Filed Vitals:   09/01/12 1000  BP: 130/63  Pulse: 105  Temp:   Resp:    Filed Vitals:   08/31/12 1148 08/31/12 2220 09/01/12 0500 09/01/12 1000  BP:  133/72 116/69 130/63  Pulse:    105  Temp:  99.1 F (37.3 C) 98.6 F (37 C)   TempSrc:  Oral Oral   Resp:  20 20   Height:      Weight: 106.6 kg (235 lb 0.2 oz)     SpO2:  96% 96%     General: She looks systemically well. Cardiovascular: Heart sounds are present and normal without murmurs. Respiratory: Lung fields are clear. Her abdomen has a fairly massive  uterine fibroid uterus which  can be felt easily. She remains alert and orientated.  Discharge Instructions  Discharge Orders    Future Orders Please Complete By Expires   Diet - low sodium heart healthy      Increase activity slowly        Medication List  As of 09/01/2012 11:18 AM   STOP taking these medications          methimazole 5 MG tablet         TAKE these medications         ALPRAZolam 1 MG tablet   Commonly known as: XANAX   Take 1 tablet (1 mg total) by mouth at bedtime as needed for anxiety or sleep.      atenolol 50 MG tablet   Commonly known as: TENORMIN   Take 50 mg by mouth daily.      Iron 325 (65 FE) MG Tabs   Take 1 tablet by mouth daily.      oxyCODONE 5 MG immediate release tablet   Commonly known as: Oxy IR/ROXICODONE   Take 1 tablet (5 mg total) by mouth every 4 (four) hours as needed.           Follow-up Information    Follow up with NIDA,GEBRESELASSIE, MD. Schedule an appointment as soon as possible for a visit in 1 week.   Contact information:   [address] [place] [address] [phone]       Follow up with Tilda Burrow, MD. Schedule an appointment as soon as possible for a visit in 2 weeks.   Contact information:   [facility] [address] [place] [address] [phone]       Follow up with [facility], MD. Schedule an appointment as soon as possible for a visit in 3 weeks.   Contact information:   [address] [place] [address] [phone]           The results of significant diagnostics from this hospitalization (including imaging, microbiology, ancillary and laboratory) are listed below for reference.    Significant Diagnostic Studies: Dg Chest 2 View  08/28/2012  *RADIOLOGY REPORT*  Clinical Data:  Extremity edema.  CHEST - 2 VIEW  Comparison: 06/11/2012  Findings: The heart size and mediastinal contours are within normal limits.  Both lungs are clear.  The visualized skeletal structures are unremarkable.  IMPRESSION: No active disease.   Original Report Authenticated By: Reola Calkins, M.D.    US Abdomen Complete  08/29/2012  *RADIOLOGY REPORT*  Clinical Data:  Elevated liver function tests.  Renal failure.  COMPLETE ABDOMINAL  ULTRASOUND  Comparison:  CT dated 06/11/2012.  Findings:  Gallbladder:  No gallstones, gallbladder wall thickening, or pericholecystic fluid.  Common bile duct:  Normal, measuring 4.5 mm in diameter proximally.  Liver:  No focal lesion identified.  Within normal limits in parenchymal echogenicity.  IVC:  Appears normal.  Pancreas:  Obscured by overlying bowel gas.  Normal on the previous CT.  Spleen:  Normal, measuring 8.8 cm in length.  Right Kidney:  Borderline echogenic.  Otherwise, normal, measuring 10.5 cm in length.  No hydronephrosis.  Left Kidney:  Borderline echogenic.  Otherwise, normal, measuring 11.7 cm in length.  No hydronephrosis.  Abdominal aorta:  Normal in caliber at the level of the proximal and mid aorta.  The distal aorta is obscured by the previously demonstrated large uterine mass.  IMPRESSION:  1.  No  acute abnormality. 2.  Borderline echogenic kidneys, suggesting mild medical renal disease. 3.  Obscured pancreas by overlying bowel gas. 4.  Obscured distal abdominal aorta by the previously demonstrated large uterine mass.   Original Report Authenticated By: Darrol Angel, M.D.    US Venous Img Lower Bilateral  08/29/2012  *RADIOLOGY REPORT*  Clinical Data: Leg swelling, pain.  Pelvic mass.  BILATERAL LOWER EXTREMITY VENOUS DOPPLER ULTRASOUND  Technique: Gray-scale sonography with compression, as well as color and duplex ultrasound, were performed to evaluate the deep venous system from the level of the common femoral vein through the popliteal and proximal calf veins.  Comparison: CT 06/11/2012  Findings:  Normal compressibility of  the common femoral, superficial femoral, and popliteal veins, as well as the proximal calf veins.  No filling defects to suggest DVT on grayscale or color Doppler imaging.  Doppler waveforms show normal direction of venous flow, normal respiratory phasicity and response to augmentation.  IMPRESSION: No evidence of  lower extremity deep vein thrombosis.    Original Report Authenticated By: Osa Craver, M.D.    Transthoracic Echocardiography  Patient: Leianna, Barga MR #: [id number] Study Date: 08/31/2012 Gender: F Age: 30 Height: 175.3cm Weight: 105.5kg BSA: 2.70m^2 Pt. Status: Room: A310  PERFORMING Delma Freeze Penn ADMITTING Anderson Malta ATTENDING Soyla Dryer REFERRING Anderson Malta SONOGRAPHER Nestor Ramp cc:  ------------------------------------------------------------ LV EF: 55% - 60%  ------------------------------------------------------------ Indications: Hypertension - benign 401.1.  ------------------------------------------------------------ Study Conclusions  - Left ventricle: The cavity size was normal. Wall thickness was normal. Systolic function was normal. The estimated ejection fraction was in the range of 55% to 60%. Wall motion was normal; there were no regional wall motion abnormalities. Doppler parameters are consistent with abnormal left ventricular relaxation (grade 1 diastolic dysfunction). - Mitral valve: Trivial regurgitation. - Left atrium: The atrium was mildly dilated. - Right ventricle: The cavity size was mildly dilated. Systolic function was normal. - Tricuspid valve: Trivial regurgitation. - Pulmonary arteries: Systolic pressure was at the upper limits of normal. PA peak pressure: 36mm Hg (S). - Inferior vena cava: The vessel was mildly dilated; the respirophasic diameter changes were in the normal range (= 50%). - Pericardium, extracardiac: There was no pericardial effusion. Transthoracic echocardiography. M-mode, complete 2D, spectral Doppler, and color Doppler. Height: Height: 175.3cm. Weight: Weight: 105.5kg. Weight: 232lb. Body mass index: BMI: 34.3kg/m^2. Body surface area: BSA: 2.36m^2. Patient status: Inpatient. Location: Bedside  Microbiology: Recent Results (from the past 240 hour(s))  URINE CULTURE      Status: Normal   Collection Time   08/28/12 10:35 PM      Component Value Range Status Comment   Specimen Description URINE, CLEAN CATCH   Final    Special Requests NONE   Final    Culture  Setup Time 08/29/2012 21:42   Final    Colony Count >=100,000 COLONIES/ML   Final    Culture     Final    Value: Multiple bacterial morphotypes present, none predominant. Suggest appropriate recollection if clinically indicated.   Report Status 08/30/2012 FINAL   Final      Labs: Basic Metabolic Panel:  Lab 09/01/12 2440 08/30/12 0553 08/28/12 2145  NA 134* 132* 135  K 3.6 4.0 4.1  CL 97 98 100  CO2 28 25 25   GLUCOSE 96 102* 102*  BUN 24* 24* 34*  CREATININE 1.93* 1.95* 2.07*  CALCIUM 9.5 9.2 9.6  MG -- -- --  PHOS -- -- --  Liver Function Tests:  Lab 09/01/12 0515 08/28/12 2145  AST 35 25  ALT 22 14  ALKPHOS 253* 218*  BILITOT 1.1 0.6  PROT 8.2 8.7*  ALBUMIN 1.9* 2.3*     CBC:  Lab 09/01/12 0515 08/31/12 0507 08/30/12 0553 08/29/12 0602 08/28/12 2145  WBC 14.0* 12.2* 9.3 6.9 7.2  NEUTROABS -- 10.4* -- -- 5.4  HGB 10.8* 10.3* 9.2* 7.0* 7.5*  HCT 33.8* 32.1* 29.1* 22.7* 25.0*  MCV 76.5* 76.2* 74.0* 70.7* 70.8*  PLT 332 319 324 315 340     BNP: BNP (last 3 results)  Basename 08/28/12 2145  PROBNP 617.9*   CBG:   Time coordinating discharge: Greater than 30 minutes  Signed:  Isiaih Hollenbach C  [facility] 09/01/2012, 11:18 AM

## 2012-09-01 NOTE — Progress Notes (Signed)
Pt given discharge instructions and prescriptions.  Pt verbalized understanding.  IV removed and within normal limits.  Pt left the floor via wheelchair accompanied by NT, pts husband taking pt home.

## 2012-09-01 NOTE — Progress Notes (Signed)
Patient complains of having trouble sleeping. Doctor was made aware and new orders were given.

## 2012-09-13 ENCOUNTER — Other Ambulatory Visit (HOSPITAL_COMMUNITY): Payer: Self-pay | Admitting: "Endocrinology

## 2012-09-13 DIAGNOSIS — E0590 Thyrotoxicosis, unspecified without thyrotoxic crisis or storm: Secondary | ICD-10-CM

## 2012-09-14 ENCOUNTER — Encounter (HOSPITAL_COMMUNITY): Payer: Self-pay

## 2012-09-14 ENCOUNTER — Other Ambulatory Visit (HOSPITAL_COMMUNITY): Payer: 59

## 2012-09-14 ENCOUNTER — Encounter (HOSPITAL_COMMUNITY)
Admission: RE | Admit: 2012-09-14 | Discharge: 2012-09-14 | Disposition: A | Discharge status: Home / Self Care | Payer: 59 | Source: Ambulatory Visit | Attending: "Endocrinology | Admitting: "Endocrinology

## 2012-09-14 DIAGNOSIS — E0590 Thyrotoxicosis, unspecified without thyrotoxic crisis or storm: Secondary | ICD-10-CM | POA: Insufficient documentation

## 2012-09-14 MED ORDER — SODIUM IODIDE I 131 CAPSULE
10.0000 | Freq: Once | INTRAVENOUS | Status: AC | PRN
  Administered 2012-09-14: 10 via ORAL

## 2012-09-15 ENCOUNTER — Encounter (HOSPITAL_COMMUNITY)
Admission: RE | Admit: 2012-09-15 | Discharge: 2012-09-15 | Disposition: A | Discharge status: Home / Self Care | Payer: 59 | Source: Ambulatory Visit | Attending: "Endocrinology | Admitting: "Endocrinology

## 2012-09-15 ENCOUNTER — Other Ambulatory Visit (HOSPITAL_COMMUNITY): Payer: Self-pay | Admitting: "Endocrinology

## 2012-09-15 DIAGNOSIS — E0590 Thyrotoxicosis, unspecified without thyrotoxic crisis or storm: Secondary | ICD-10-CM

## 2012-09-15 DIAGNOSIS — E039 Hypothyroidism, unspecified: Secondary | ICD-10-CM | POA: Insufficient documentation

## 2012-09-16 ENCOUNTER — Other Ambulatory Visit (HOSPITAL_COMMUNITY): Payer: Self-pay | Admitting: "Endocrinology

## 2012-09-16 DIAGNOSIS — E0590 Thyrotoxicosis, unspecified without thyrotoxic crisis or storm: Secondary | ICD-10-CM

## 2012-09-23 ENCOUNTER — Inpatient Hospital Stay (HOSPITAL_COMMUNITY)
Admission: EM | Admit: 2012-09-23 | Discharge: 2012-10-07 | DRG: 742 | Disposition: A | Discharge status: Home Health | Payer: 59 | Attending: Internal Medicine | Admitting: Internal Medicine

## 2012-09-23 ENCOUNTER — Encounter (HOSPITAL_COMMUNITY): Payer: Self-pay | Admitting: Emergency Medicine

## 2012-09-23 ENCOUNTER — Ambulatory Visit (HOSPITAL_COMMUNITY): Payer: 59

## 2012-09-23 DIAGNOSIS — N179 Acute kidney failure, unspecified: Secondary | ICD-10-CM | POA: Diagnosis present

## 2012-09-23 DIAGNOSIS — IMO0002 Reserved for concepts with insufficient information to code with codable children: Secondary | ICD-10-CM | POA: Diagnosis not present

## 2012-09-23 DIAGNOSIS — T148XXA Other injury of unspecified body region, initial encounter: Secondary | ICD-10-CM | POA: Diagnosis present

## 2012-09-23 DIAGNOSIS — Z9119 Patient's noncompliance with other medical treatment and regimen: Secondary | ICD-10-CM

## 2012-09-23 DIAGNOSIS — D649 Anemia, unspecified: Secondary | ICD-10-CM

## 2012-09-23 DIAGNOSIS — I82409 Acute embolism and thrombosis of unspecified deep veins of unspecified lower extremity: Secondary | ICD-10-CM

## 2012-09-23 DIAGNOSIS — D259 Leiomyoma of uterus, unspecified: Principal | ICD-10-CM | POA: Diagnosis present

## 2012-09-23 DIAGNOSIS — N189 Chronic kidney disease, unspecified: Secondary | ICD-10-CM | POA: Diagnosis present

## 2012-09-23 DIAGNOSIS — E43 Unspecified severe protein-calorie malnutrition: Secondary | ICD-10-CM | POA: Diagnosis present

## 2012-09-23 DIAGNOSIS — E0500 Thyrotoxicosis with diffuse goiter without thyrotoxic crisis or storm: Secondary | ICD-10-CM | POA: Diagnosis present

## 2012-09-23 DIAGNOSIS — Y9210 Unspecified residential institution as the place of occurrence of the external cause: Secondary | ICD-10-CM | POA: Diagnosis not present

## 2012-09-23 DIAGNOSIS — R760 Raised antibody titer: Secondary | ICD-10-CM | POA: Diagnosis present

## 2012-09-23 DIAGNOSIS — D500 Iron deficiency anemia secondary to blood loss (chronic): Secondary | ICD-10-CM

## 2012-09-23 DIAGNOSIS — N390 Urinary tract infection, site not specified: Secondary | ICD-10-CM | POA: Diagnosis present

## 2012-09-23 DIAGNOSIS — I82629 Acute embolism and thrombosis of deep veins of unspecified upper extremity: Secondary | ICD-10-CM | POA: Diagnosis not present

## 2012-09-23 DIAGNOSIS — R609 Edema, unspecified: Secondary | ICD-10-CM | POA: Diagnosis not present

## 2012-09-23 DIAGNOSIS — E042 Nontoxic multinodular goiter: Secondary | ICD-10-CM | POA: Diagnosis present

## 2012-09-23 DIAGNOSIS — Z91199 Patient's noncompliance with other medical treatment and regimen due to unspecified reason: Secondary | ICD-10-CM

## 2012-09-23 DIAGNOSIS — R030 Elevated blood-pressure reading, without diagnosis of hypertension: Secondary | ICD-10-CM

## 2012-09-23 DIAGNOSIS — Y836 Removal of other organ (partial) (total) as the cause of abnormal reaction of the patient, or of later complication, without mention of misadventure at the time of the procedure: Secondary | ICD-10-CM | POA: Diagnosis not present

## 2012-09-23 DIAGNOSIS — E0590 Thyrotoxicosis, unspecified without thyrotoxic crisis or storm: Secondary | ICD-10-CM

## 2012-09-23 DIAGNOSIS — Z6829 Body mass index (BMI) 29.0-29.9, adult: Secondary | ICD-10-CM

## 2012-09-23 DIAGNOSIS — E079 Disorder of thyroid, unspecified: Secondary | ICD-10-CM | POA: Diagnosis present

## 2012-09-23 DIAGNOSIS — E860 Dehydration: Secondary | ICD-10-CM | POA: Diagnosis present

## 2012-09-23 DIAGNOSIS — D62 Acute posthemorrhagic anemia: Secondary | ICD-10-CM | POA: Diagnosis not present

## 2012-09-23 HISTORY — DX: Thyrotoxicosis with diffuse goiter without thyrotoxic crisis or storm: E05.00

## 2012-09-23 HISTORY — DX: Anemia, unspecified: D64.9

## 2012-09-23 HISTORY — DX: Nontoxic single thyroid nodule: E04.1

## 2012-09-23 HISTORY — DX: Raised antibody titer: R76.0

## 2012-09-23 HISTORY — DX: Nontoxic multinodular goiter: E04.2

## 2012-09-23 HISTORY — DX: Acute embolism and thrombosis of unspecified deep veins of unspecified lower extremity: I82.409

## 2012-09-23 LAB — HEPATIC FUNCTION PANEL
ALT: 26 U/L (ref 0–35)
Alkaline Phosphatase: 135 U/L — ABNORMAL HIGH (ref 39–117)
Bilirubin, Direct: 1 mg/dL — ABNORMAL HIGH (ref 0.0–0.3)
Indirect Bilirubin: 0.6 mg/dL (ref 0.3–0.9)
Total Protein: 8.2 g/dL (ref 6.0–8.3)

## 2012-09-23 LAB — CBC
HCT: 25.7 % — ABNORMAL LOW (ref 36.0–46.0)
MCH: 24.6 pg — ABNORMAL LOW (ref 26.0–34.0)
MCV: 76 fL — ABNORMAL LOW (ref 78.0–100.0)
RBC: 3.38 MIL/uL — ABNORMAL LOW (ref 3.87–5.11)
RDW: 22.9 % — ABNORMAL HIGH (ref 11.5–15.5)
WBC: 10.4 10*3/uL (ref 4.0–10.5)

## 2012-09-23 LAB — BASIC METABOLIC PANEL
BUN: 39 mg/dL — ABNORMAL HIGH (ref 6–23)
CO2: 26 mEq/L (ref 19–32)
Chloride: 96 mEq/L (ref 96–112)
Creatinine, Ser: 1.77 mg/dL — ABNORMAL HIGH (ref 0.50–1.10)

## 2012-09-23 LAB — URINALYSIS, ROUTINE W REFLEX MICROSCOPIC
Glucose, UA: NEGATIVE mg/dL
Ketones, ur: NEGATIVE mg/dL
pH: 6 (ref 5.0–8.0)

## 2012-09-23 LAB — BLOOD GAS, ARTERIAL
Drawn by: 22223
O2 Content: 2 L/min
TCO2: 23 mmol/L (ref 0–100)
pCO2 arterial: 33.6 mmHg — ABNORMAL LOW (ref 35.0–45.0)
pH, Arterial: 7.47 — ABNORMAL HIGH (ref 7.350–7.450)

## 2012-09-23 LAB — URINE MICROSCOPIC-ADD ON

## 2012-09-23 LAB — FIBRINOGEN: Fibrinogen: 585 mg/dL — ABNORMAL HIGH (ref 204–475)

## 2012-09-23 LAB — PROTIME-INR: Prothrombin Time: 16.8 seconds — ABNORMAL HIGH (ref 11.6–15.2)

## 2012-09-23 LAB — LACTIC ACID, PLASMA: Lactic Acid, Venous: 1 mmol/L (ref 0.5–2.2)

## 2012-09-23 LAB — GLUCOSE, CAPILLARY: Glucose-Capillary: 90 mg/dL (ref 70–99)

## 2012-09-23 LAB — PREPARE RBC (CROSSMATCH)

## 2012-09-23 MED ORDER — OXYCODONE HCL 5 MG PO TABS
5.0000 mg | ORAL_TABLET | ORAL | Status: DC | PRN
  Administered 2012-09-23 – 2012-09-26 (×10): 5 mg via ORAL
  Filled 2012-09-23 (×10): qty 1

## 2012-09-23 MED ORDER — ONDANSETRON HCL 4 MG PO TABS: 4.0000 mg | ORAL_TABLET | Freq: Four times a day (QID) | ORAL | Status: DC | PRN

## 2012-09-23 MED ORDER — SODIUM CHLORIDE 0.9 % IV SOLN: INTRAVENOUS | Status: DC

## 2012-09-23 MED ORDER — SODIUM CHLORIDE 0.9 % IV BOLUS (SEPSIS)
500.0000 mL | Freq: Once | INTRAVENOUS | Status: AC
  Administered 2012-09-23: 500 mL via INTRAVENOUS

## 2012-09-23 MED ORDER — ALPRAZOLAM 0.5 MG PO TABS: 1.0000 mg | ORAL_TABLET | Freq: Every evening | ORAL | Status: DC | PRN

## 2012-09-23 MED ORDER — PROPRANOLOL HCL 20 MG PO TABS: 40.0000 mg | ORAL_TABLET | Freq: Two times a day (BID) | ORAL | Status: DC

## 2012-09-23 MED ORDER — DEXTROSE-NACL 5-0.9 % IV SOLN
INTRAVENOUS | Status: DC
  Administered 2012-09-23: 17:00:00 via INTRAVENOUS

## 2012-09-23 MED ORDER — CEFTRIAXONE SODIUM 1 G IJ SOLR
1.0000 g | INTRAMUSCULAR | Status: DC
  Administered 2012-09-23 – 2012-09-24 (×2): 1 g via INTRAVENOUS
  Filled 2012-09-23 (×3): qty 10

## 2012-09-23 MED ORDER — DEXTROSE 5 % IV SOLN
INTRAVENOUS | Status: AC
  Filled 2012-09-23: qty 10

## 2012-09-23 MED ORDER — ONDANSETRON HCL 4 MG/2ML IJ SOLN
4.0000 mg | Freq: Four times a day (QID) | INTRAMUSCULAR | Status: DC | PRN
  Administered 2012-09-26 (×2): 4 mg via INTRAVENOUS
  Filled 2012-09-23 (×2): qty 2

## 2012-09-23 MED ORDER — SODIUM CHLORIDE 0.9 % IV BOLUS (SEPSIS)
500.0000 mL | Freq: Once | INTRAVENOUS | Status: AC
  Administered 2012-09-24: 500 mL via INTRAVENOUS

## 2012-09-23 MED ORDER — ACETAMINOPHEN 325 MG PO TABS
650.0000 mg | ORAL_TABLET | Freq: Four times a day (QID) | ORAL | Status: DC | PRN
  Administered 2012-09-23 – 2012-09-26 (×4): 650 mg via ORAL
  Filled 2012-09-23 (×4): qty 2

## 2012-09-23 MED ORDER — SODIUM CHLORIDE 0.9 % IV SOLN
Freq: Once | INTRAVENOUS | Status: AC
  Administered 2012-09-23: 14:00:00 via INTRAVENOUS

## 2012-09-23 MED ORDER — BIOTENE DRY MOUTH MT LIQD
15.0000 mL | Freq: Two times a day (BID) | OROMUCOSAL | Status: DC
  Administered 2012-09-23 – 2012-09-27 (×9): 15 mL via OROMUCOSAL

## 2012-09-23 MED ORDER — PROPRANOLOL HCL 20 MG PO TABS: 20.0000 mg | ORAL_TABLET | Freq: Two times a day (BID) | ORAL | Status: DC

## 2012-09-23 NOTE — Progress Notes (Signed)
Paged VS post bolus of IV fluids to Dr. Rito Ehrlich

## 2012-09-23 NOTE — Progress Notes (Signed)
Called Dr. Rito Ehrlich in regards to patients status, patient is drowsy but able to answer questions, VS BP 95/57, HR 94, Temp 101.1, O2 99% on 2L, Dr. Rito Ehrlich in room with patient at this time.

## 2012-09-23 NOTE — Consult Note (Signed)
[id number] 

## 2012-09-23 NOTE — ED Provider Notes (Cosign Needed)
History   This chart was scribed for [name], MD by Sofie Rower. The patient was seen in room APA06/APA06 and the patient's care was started at 12:38PM    CSN: [id number]  Arrival date & time 09/23/12  1019   First MD Initiated Contact with Patient 09/23/12 1238      Chief Complaint  Patient presents with  . Weakness    (Consider location/radiation/quality/duration/timing/severity/associated sxs/prior treatment) Patient is a 41 y.o. female presenting with weakness. The history is provided by the patient. No language interpreter was used.  Weakness The primary symptoms include dizziness and nausea. Primary symptoms do not include headaches, speech change or fever. The symptoms began more than 1 week ago. The symptoms are worsening. The neurological symptoms are diffuse. Context: Chronic Fibroid tumor and thyroid disease.  She describes the dizziness as lightheadedness. The dizziness began more than 1 week ago. The dizziness has been gradually worsening since its onset. It is a new problem. Dizziness also occurs with nausea and weakness.  Nausea began more than 1 week ago.  Additional symptoms include weakness. Associated medical issues comments: Fibroid tumor.[name] is a 41 y.o. female , with a hx of fibroid tumor, who presents to the Emergency Department complaining of sudden, progressively worsening, weakness, onset two weeks ago,  with associated symptoms of dizziness, loss of appetite, nausea. The pt reports she has visited with Dr.Knee, endocrinologist, on 09/17/12 and has been scheduled to have surgery on Friday, 09/24/12.  The pt does not smoke or drink alcohol.   PCP is Dr. Emelda Fear.    Past Medical History  Diagnosis Date  . Thyroid disease   . Fibroid tumor     History reviewed. No pertinent past surgical history.  History reviewed. No pertinent family history.  History  Substance Use Topics  . Smoking status: Never Smoker   . Smokeless  tobacco: Never Used  . Alcohol Use: No    OB History    Grav Para Term Preterm Abortions TAB SAB Ect Mult Living                  Review of Systems  Constitutional: Negative for fever.  Gastrointestinal: Positive for nausea.  Neurological: Positive for dizziness and weakness. Negative for speech change and headaches.  All other systems reviewed and are negative.    Allergies  Review of patient's allergies indicates no known allergies.  Home Medications   Current Outpatient Rx  Name Route Sig Dispense Refill  . ALPRAZOLAM 1 MG PO TABS Oral Take 1 tablet (1 mg total) by mouth at bedtime as needed for anxiety or sleep. 30 tablet 0  . PROPRANOLOL HCL 20 MG PO TABS Oral Take 20 mg by mouth 2 (two) times daily.      BP 104/51  Pulse 86  Temp 98.7 F (37.1 C) (Oral)  Resp 20  SpO2 100%  LMP 09/18/2012  Physical Exam  Nursing note and vitals reviewed. Constitutional: She is oriented to person, place, and time. She appears well-developed and well-nourished.  HENT:  Head: Atraumatic.  Nose: Nose normal.  Eyes: Conjunctivae normal are normal.       Bilateral protosis.   Cardiovascular: Normal rate, regular rhythm and normal heart sounds.   Pulmonary/Chest: Effort normal and breath sounds normal.  Abdominal: She exhibits mass.       Fibroid felt up to costal margin.   Musculoskeletal: She exhibits edema. She exhibits no tenderness.  2 + edema detected in ankles bilaterally.   Neurological: She is alert and oriented to person, place, and time.       Pt seems confused, however, there is no focal deficit.   Skin: Skin is warm and dry.  Psychiatric: She has a normal mood and affect. Her behavior is normal.    ED Course  Procedures (including critical care time)  DIAGNOSTIC STUDIES: Oxygen Saturation is 100% on room air, normal by my interpretation.    COORDINATION OF CARE:    1:23PM- Blood work, UA, chest x-ray, and hospital admission discussed with patient. Pt  agrees with treatment.   Labs Reviewed  CBC - Abnormal; Notable for the following:    RBC 3.38 (*)     Hemoglobin 8.3 (*)     HCT 25.7 (*)     MCV 76.0 (*)     MCH 24.6 (*)     RDW 22.9 (*)     Platelets 403 (*)     All other components within normal limits  BASIC METABOLIC PANEL - Abnormal; Notable for the following:    Sodium 131 (*)     Glucose, Bld 112 (*)     BUN 39 (*)     Creatinine, Ser 1.77 (*)     GFR calc non Af Amer 34 (*)     GFR calc Af Amer 40 (*)     All other components within normal limits  URINALYSIS, ROUTINE W REFLEX MICROSCOPIC  URINE CULTURE  PROTIME-INR  APTT  TSH  HEPATIC FUNCTION PANEL  TYPE AND SCREEN  ABO/RH  ANTIBODY SCREEN  FIBRINOGEN  PREPARE RBC (CROSSMATCH)   3:17 PM Results for orders placed during the hospital encounter of 09/23/12  CBC      Component Value Range   WBC 10.4  4.0 - 10.5 K/uL   RBC 3.38 (*) 3.87 - 5.11 MIL/uL   Hemoglobin 8.3 (*) 12.0 - 15.0 g/dL   HCT 16.1 (*) 09.6 - 04.5 %   MCV 76.0 (*) 78.0 - 100.0 fL   MCH 24.6 (*) 26.0 - 34.0 pg   MCHC 32.3  30.0 - 36.0 g/dL   RDW 40.9 (*) 81.1 - 91.4 %   Platelets 403 (*) 150 - 400 K/uL  BASIC METABOLIC PANEL      Component Value Range   Sodium 131 (*) 135 - 145 mEq/L   Potassium 3.5  3.5 - 5.1 mEq/L   Chloride 96  96 - 112 mEq/L   CO2 26  19 - 32 mEq/L   Glucose, Bld 112 (*) 70 - 99 mg/dL   BUN 39 (*) 6 - 23 mg/dL   Creatinine, Ser 7.82 (*) 0.50 - 1.10 mg/dL   Calcium 9.6  8.4 - 95.6 mg/dL   GFR calc non Af Amer 34 (*) >90 mL/min   GFR calc Af Amer 40 (*) >90 mL/min  PROTIME-INR      Component Value Range   Prothrombin Time 16.8 (*) 11.6 - 15.2 seconds   INR 1.40  0.00 - 1.49  APTT      Component Value Range   aPTT 33  24 - 37 seconds  HEPATIC FUNCTION PANEL      Component Value Range   Total Protein 8.2  6.0 - 8.3 g/dL   Albumin 1.8 (*) 3.5 - 5.2 g/dL   AST 33  0 - 37 U/L   ALT 26  0 - 35 U/L   Alkaline Phosphatase 135 (*) 39 - 117 U/L  Total Bilirubin  1.6 (*) 0.3 - 1.2 mg/dL   Bilirubin, Direct 1.0 (*) 0.0 - 0.3 mg/dL   Indirect Bilirubin 0.6  0.3 - 0.9 mg/dL  TYPE AND SCREEN      Component Value Range   ABO/RH(D) PENDING     Antibody Screen PENDING     Sample Expiration 09/26/2012    FIBRINOGEN      Component Value Range   Fibrinogen 585 (*) 204 - 475 mg/dL  PREPARE RBC (CROSSMATCH)      Component Value Range   Order Confirmation ORDER PROCESSED BY BLOOD BANK      3:17 PM Review of lab tests shows that she has become anemic again.  Her albumin is very low.  She has dehydration manifested by a high BUN. Call to Dr. Karilyn Cota, who will see and admit pt.   1. Dehydration   2. Hyperthyroidism   3. Anemia   4. Uterine leiomyoma     I personally performed the services described in this documentation, which was scribed in my presence. The recorded information has been reviewed and considered.  Osvaldo Human, M.D.       [name], MD 09/23/12 [phone]

## 2012-09-23 NOTE — Progress Notes (Signed)
Patient noted to have moderate amount of foul smelling yellow to tan colored discharge from around foley and from vagina, peri care performed, vital signs obtained, BP 97/62, HR 81, Resp 18, O2 99% on 2L, Temp 98.4, paged Dr. Rito Ehrlich to inform of findings

## 2012-09-23 NOTE — H&P (Addendum)
[facility] History and Physical  CARLISA EBLE BJY:[id number] DOB: [birth year] DOA: 09/23/2012  Referring physician: Dr. Ignacia Palma, ER. PCP: Tilda Burrow, MD   Chief Complaint: Anorexia, nausea/vomiting, weakness.  HPI: [name] is a 41 y.o. female who is well-known to our service. She has a massive fibroid uterus which has led to anemia from ongoing bleeding. She has been under the care of Dr. Emelda Fear, OB/GYN for this, and prior to her having the fibroid removed, he has been giving her Lupron injections. Also, unfortunately she has Graves' disease with goiter. Prior to any surgery, but Graves' disease needs to be treated. In the last couple of weeks she has not been feeling well and now she comes in a dehydrated state and also anemic once again.   Review of Systems:  Apart from history of present illness, other systems negative.  Past Medical History  Diagnosis Date  . Thyroid disease   . Fibroid tumor    History reviewed. No pertinent past surgical history. Social History:  reports that she has never smoked. She has never used smokeless tobacco. She reports that she does not drink alcohol or use illicit drugs.   No Known Allergies  History reviewed. No pertinent family history.  there is no other family history of thyroid disease.  Prior to Admission medications   Medication Sig Start Date End Date Taking? Authorizing Provider  ALPRAZolam Prudy Feeler) 1 MG tablet Take 1 tablet (1 mg total) by mouth at bedtime as needed for anxiety or sleep. 09/01/12 10/01/12 Yes Nimish Normajean Glasgow, MD  propranolol (INDERAL) 20 MG tablet Take 20 mg by mouth 2 (two) times daily.   Yes Historical Provider, MD   Physical Exam: Filed Vitals:   09/23/12 1033 09/23/12 1105 09/23/12 1251 09/23/12 1453  BP: 107/51  104/51 104/56  Pulse: 92  86 88  Temp: 98.6 F (37 C) 98.7 F (37.1 C)    TempSrc: Oral     Resp: 20  20 18   SpO2: 100%  100% 100%     General:  She looks clinically  dehydrated and generally sick. Somewhat pale. No jaundice. She does have peripheral pitting edema.  Eyes: Exophthalmus.  ENT: No abnormalities.  Neck: Goiter present.  Cardiovascular: Heart sounds are present and not tachycardic at rest. No murmurs.  Respiratory: Lung fields are clear.  Abdomen: Soft, large fibroid mass felt. This mass was somewhat tender.  Skin: No rashes.  Musculoskeletal: No joint abnormalities.  Psychiatric: Somewhat depressed.  Neurologic: Alert and orientated, no focal neurological signs.  Labs on Admission:  Basic Metabolic Panel:  Lab 09/23/12 0865  NA 131*  K 3.5  CL 96  CO2 26  GLUCOSE 112*  BUN 39*  CREATININE 1.77*  CALCIUM 9.6  MG --  PHOS --   Liver Function Tests:  Lab 09/23/12 1415  AST 33  ALT 26  ALKPHOS 135*  BILITOT 1.6*  PROT 8.2  ALBUMIN 1.8*     CBC:  Lab 09/23/12 1104  WBC 10.4  NEUTROABS --  HGB 8.3*  HCT 25.7*  MCV 76.0*  PLT 403*      BNP (last 3 results)  Basename 08/28/12 2145  PROBNP 617.9*          Assessment/Plan   1. Dehydration. 2. Acute on chronic renal failure secondary to #1. 3. Anemia from chronic blood loss secondary to massive fibroid uterus. 4. Graves' disease. 5. UTI on urinalysis. Plan: 1. Admit patient to the medical floor. 2. Intravenous fluids for hydration.  3. 2 units blood present in from her symptomatic blood loss anemia. 4. Endocrinology consultation regarding a Graves' disease. 5. Empirical intravenous antibiotics for UTI. Urine for culture. Further recommendations will depend on patient's hospital progress.   Code Status: Full code. Family Communication: Discussed by the patient at the bedside. Disposition Plan: Home in medically stable.  Time spent: 30 minutes.  Wilson Singer [facility] Pager [phone].  If 7PM-7AM, please contact night-coverage [email or web address] Password [email or web address] 09/23/2012, 3:30 PM

## 2012-09-23 NOTE — Progress Notes (Signed)
New orders given to hold on blood transfusion at this time, to give bolus of IV Fluids and Tylenol, new orders for labwork ordered, I will call Dr. Rito Ehrlich with VS after bolus is given

## 2012-09-23 NOTE — ED Notes (Signed)
Pt states she was started on a new med and needs to have her thyroid removed

## 2012-09-23 NOTE — Consult Note (Signed)
[name], [name]              ACCOUNT NO.:  [id number]  MEDICAL RECORD NO.:  [id number]  LOCATION:  A308                          FACILITY:  [facility]  PHYSICIAN:  Purcell Nails, MD DATE OF BIRTH:  03-02-1971  DATE OF CONSULTATION:  09/23/2012 DATE OF DISCHARGE:                                CONSULTATION   REASON FOR CONSULT:  Hyperthyroidism.  HISTORY OF PRESENT ILLNESS:  The patient is a 41 year old African American female with multiple medical problems as follows.  The patient is familiar to me from prior consult.  She was diagnosed with hyperthyroidism early September when she was worked up for anemia and palpitation.  She was briefly put on methimazole which she took for about a week.  After that she was admitted with diagnosis of iron-loss anemia at which time, her thyroid function test was normalized and the patient was scheduled to have a thyroid uptake and scan as an outpatient.  Radioactive iodine was given to her on September 15, 2012 and she was supposed to go back for a scan on the 19, 2013, however, unfortunately she missed that appointment.  Hence her thyroid uptake and scan is going to be repeated on subsequent days.  Her major ongoing problem is massive fibroid for which she is being prepared for surgery. She has iron loss anemia for which she required multiple transfusions. At this time the patient comes in with a complaint of weakness and the patient is being hospitalized for another stabilization measure.  She will receive another session of blood transfusion.  Consult for Endocrinology is due to hyperthyroidism.  PAST MEDICAL HISTORY:  Massive uterine fibroid, hyperthyroidism, iron- loss anemia.  PAST SURGICAL HISTORY:  Nothing remarkable.  SOCIAL HISTORY:  Negative for smoking, alcohol, or drug use.  HOME MEDICATIONS:  Include propranolol 20 mg b.i.d. and alprazolam 1 mg p.o. at bedtime.  REVIEW OF SYSTEMS:  Significant for weakness, wheelchair  bound, significant diffuse body swelling, dizziness and negative for chest pain, bleeding nor injuries.  Rest of the systems have been recorded and negative.  The patient did have significant anorexia with diminished oral intake for the last couple of days.  PHYSICAL EXAMINATION:  GENERAL:  She is weak looking in a hospital bed. HEENT:  Significant for bilateral proptosis and dehydrated mucous membranes.  She has pale conjunctiva.  Anicteric sclera. NECK:  Significant for palpable thyroid. CHEST:  Clear to auscultation bilaterally.  CARDIOVASCULAR:  S1, S2 well heard.  There is a systolic murmur on pericardium. ABDOMEN:  Significant for large central abdominal mass slightly tender on deep palpation. EXTREMITIES:  Large edema which is pitting. CNS:  Hand grips are normal.  She moves all extremities.  SKIN:  Dry on her extremities.  LABORATORY DATA:  Her recent blood work shows sodium 131 potassium 3.5, chloride 96, bicarb 26, BUN 39, creatinine 1.7, calcium 9.6, albumin 1.8.  AST and ALT are normal.  Her most recent thyroid function test from September 13, 2012 was stable with TSH 0.97, free T4 1.4, free T3 normal at 2.2, PT orient thyroglobulin antibodies were negative.  ASSESSMENT: 1. Hyperthyroidism currently controlled and stable, status post brief     treatment with methimazole.  The patient is going to have thyroid     uptake and scan while she is in hospital. 2. Massive uterine fibroid. 3. Anemia. 4. Possible high output cardiac failure. 5. Medical noncompliance. 6. Dehydration. 7. Poor social support.  DISCUSSION:  The patient has a serious illnesses, however, because of her social limitations, she could not comply with outpatient thyroid uptake and scan.  She missed the scanning appointment after she received radioactive iodine by mouth.  Because of this, I plan to perform this test while she is here.  I will continue to control tachycardia with beta-blockers.  If her  thyroid uptake and scan is significantly elevated, her best option of therapy will be radioactive iodine thyroid ablation, preferably while patient is still in the hospital.  Besides currently clinically controlled hyperthyroidism, her major problem appears to be massive uterine myoma for which she is being prepared for surgery.  She is undergoing multiple sessions of blood transfusion.  If her emergency myomectomy is indicated this should not be delayed. Meantime the patient will need large nutritional support given her hypoalbuminemia inducing third-spacing of her intravascular volume.  I will change her IV fluids to dextrose and she may need assessment by the nutritional Services.  I will obtain her repeat free T4, free T3, and TSH.  I have ordered the thyroid uptake and scan hopefully to be started first in the morning by Nuclear Medicine.  Dear Dr. Karilyn Cota, thank you for the opportunity to participate in the care of this pleasant patient.  I will update you in her progress.          ______________________________ Purcell Nails, MD     GN/MEDQ  D:  09/23/2012  T:  09/23/2012  Job:  [id number]

## 2012-09-24 ENCOUNTER — Other Ambulatory Visit (HOSPITAL_COMMUNITY): Payer: 59

## 2012-09-24 LAB — HEMOGLOBIN A1C
Hgb A1c MFr Bld: 5.4 % (ref <5.7)
Mean Plasma Glucose: 108 mg/dL (ref <117)

## 2012-09-24 LAB — COMPREHENSIVE METABOLIC PANEL
AST: 20 U/L (ref 0–37)
Albumin: 1.5 g/dL — ABNORMAL LOW (ref 3.5–5.2)
Chloride: 104 mEq/L (ref 96–112)
Creatinine, Ser: 1.48 mg/dL — ABNORMAL HIGH (ref 0.50–1.10)
Total Bilirubin: 1.1 mg/dL (ref 0.3–1.2)

## 2012-09-24 LAB — URINE CULTURE
Colony Count: NO GROWTH
Culture: NO GROWTH

## 2012-09-24 LAB — CBC
HCT: 28.9 % — ABNORMAL LOW (ref 36.0–46.0)
Hemoglobin: 7.8 g/dL — ABNORMAL LOW (ref 12.0–15.0)
Hemoglobin: 9.5 g/dL — ABNORMAL LOW (ref 12.0–15.0)
MCH: 24.5 pg — ABNORMAL LOW (ref 26.0–34.0)
MCH: 25.3 pg — ABNORMAL LOW (ref 26.0–34.0)
MCHC: 32.9 g/dL (ref 30.0–36.0)
MCV: 77.7 fL — ABNORMAL LOW (ref 78.0–100.0)
MCV: 77.1 fL — ABNORMAL LOW (ref 78.0–100.0)
Platelets: 299 10*3/uL (ref 150–400)
RBC: 3.18 MIL/uL — ABNORMAL LOW (ref 3.87–5.11)
RBC: 3.75 MIL/uL — ABNORMAL LOW (ref 3.87–5.11)

## 2012-09-24 LAB — WET PREP, GENITAL
Trich, Wet Prep: NONE SEEN
Yeast Wet Prep HPF POC: NONE SEEN

## 2012-09-24 LAB — TSH: TSH: 1.406 u[IU]/mL (ref 0.350–4.500)

## 2012-09-24 LAB — MRSA PCR SCREENING: MRSA by PCR: NEGATIVE

## 2012-09-24 MED ORDER — METRONIDAZOLE IN NACL 5-0.79 MG/ML-% IV SOLN
500.0000 mg | Freq: Two times a day (BID) | INTRAVENOUS | Status: DC
  Administered 2012-09-24 – 2012-09-25 (×2): 500 mg via INTRAVENOUS
  Filled 2012-09-24 (×6): qty 100

## 2012-09-24 MED ORDER — ADULT MULTIVITAMIN LIQUID CH
5.0000 mL | Freq: Every day | ORAL | Status: DC
  Administered 2012-09-24 – 2012-09-26 (×3): 5 mL via ORAL
  Filled 2012-09-24 (×6): qty 5

## 2012-09-24 MED ORDER — POTASSIUM CHLORIDE CRYS ER 20 MEQ PO TBCR
40.0000 meq | EXTENDED_RELEASE_TABLET | Freq: Once | ORAL | Status: AC
  Administered 2012-09-24: 40 meq via ORAL
  Filled 2012-09-24: qty 2

## 2012-09-24 MED ORDER — SODIUM CHLORIDE 0.9 % IV BOLUS (SEPSIS)
1000.0000 mL | Freq: Once | INTRAVENOUS | Status: AC
  Administered 2012-09-24: 1000 mL via INTRAVENOUS

## 2012-09-24 MED ORDER — SODIUM CHLORIDE 0.9 % IV SOLN
INTRAVENOUS | Status: DC
  Administered 2012-09-24 – 2012-09-25 (×3): via INTRAVENOUS

## 2012-09-24 NOTE — Progress Notes (Signed)
[name], [name]              ACCOUNT NO.:  [id number]  MEDICAL RECORD NO.:  [id number]  LOCATION:  IC03                          FACILITY:  [facility]  PHYSICIAN:  Purcell Nails, MD DATE OF BIRTH:  12-08-1971  DATE OF PROCEDURE:  09/24/2012 DATE OF DISCHARGE:                                PROGRESS NOTE   SUBJECTIVE:  The patient was transferred to ICU overnight due to hypotension.  She is status post 2 units of blood transfusion, however, reports she is feeling better this morning.  Her appetite is good.  No tachy arrhythmia.  OBJECTIVE:  GENERAL:  She feels and looks sick. VITAL SIGNS:  Blood pressure is 102/62, pulse rate 79, temperature 97.7. HEENT:  She has bilateral proptosis, icterus, pale conjunctivae, anicteric. NECK:  No JVD. CHEST:  Clear to auscultation bilaterally. CARDIOVASCULAR:  Normal S1, S2. ABDOMEN:  Significant for a central firm mass slightly tender on deep palpation.  This is presumed to be uterine fibroid. EXTREMITIES:  She has large extremities with pitting edema. CNS:  Nonfocal.  MOST RECENT LABORATORY DATA:  Stable thyroid function test, with TSH 1.4, free T4 1.24, free T3 low normal at 2.1.  ASSESSMENT: 1. Hyperthyroidism, currently controlled and stable for the last 2     weeks, status post brief treatment with methimazole.  The patient     is awaiting for redo of thyroid uptake and scan because she missed a      scan appointment as an outpatient. 2. Massive uterine fibroid being prepared for surgery. 3. Anemia. 4. Possible high-output cardiac failure. 5. Medical noncompliance. 6. Dehydration. 7. Nutritional debility.  PLAN:  The patient will not need antithyroid medications at this point. However, after completion of her thyroid uptake and scan, she will need definitive thyroid ablation with radioactive iodine.  However, the required surgery should proceed as planned for the massive uterine fibroids.  I will be closely monitoring her  while she is undergoing this therapy in house.  If the patient is to be transferred to another facility, Endocrinology consult should be obtained to monitor thyroid function while she is undergoing abdominal surgery.  For now, she will continue on the beta blocker, propranolol 40 mg p.o. b.i.d., and I will continue to follow and update you on her progress.          ______________________________ Purcell Nails, MD     GN/MEDQ  D:  09/24/2012  T:  09/24/2012  Job:  [id number]

## 2012-09-24 NOTE — Progress Notes (Signed)
#  2 unit prbc completed. Pt states she feels a lot better. Dr nida  In to consult on pt

## 2012-09-24 NOTE — Progress Notes (Signed)
#  1 unit of prbc started as ordered

## 2012-09-24 NOTE — Progress Notes (Signed)
Pt transported via stretcher to ER-ob/gyn room for Dr Gar Ponto to preform pelvic examine and possibly preform an endometrial biopsy. Vss. Pt is very drowsy.

## 2012-09-24 NOTE — Progress Notes (Signed)
Report called to Nadine Counts, RN, patient transferred via bed to stepdown, O2 on via nasal cannula, no voiced complaints at this time, blood transfusing at this time.

## 2012-09-24 NOTE — Progress Notes (Signed)
INITIAL ADULT NUTRITION ASSESSMENT Date: 09/24/2012   Time: 10:08 AM Reason for Assessment: Malnutrition Screen Score=5  ASSESSMENT: Female 41 y.o.  Dx: Dehydration   Past Medical History  Diagnosis Date  . Thyroid disease   . Fibroid tumor     Scheduled Meds:   . sodium chloride   Intravenous Once  . antiseptic oral rinse  15 mL Mouth Rinse BID  . cefTRIAXone (ROCEPHIN)  IV  1 g Intravenous Q24H  . potassium chloride  40 mEq Oral Once  . sodium chloride  1,000 mL Intravenous Once  . sodium chloride  500 mL Intravenous Once  . sodium chloride  500 mL Intravenous Once  . DISCONTD: sodium chloride   Intravenous STAT  . DISCONTD: propranolol  20 mg Oral BID  . DISCONTD: propranolol  40 mg Oral BID   Continuous Infusions:   . sodium chloride 75 mL/hr at 09/24/12 0900  . DISCONTD: sodium chloride    . DISCONTD: dextrose 5 % and 0.9% NaCl Stopped (09/24/12 0013)   PRN Meds:.acetaminophen, ondansetron (ZOFRAN) IV, ondansetron, oxyCODONE, DISCONTD: ALPRAZolam  Ht: 5\' 9"  (175.3 cm)  Wt: 204 lb 12.9 oz (92.9 kg)  Ideal Wt: 66.2 kg  % Ideal Wt: 141%  Usual Wt: 270# April 2013 % Usual Wt: 75%  Wt Readings from Last 10 Encounters:  09/23/12 204 lb 12.9 oz (92.9 kg)  08/31/12 235 lb 0.2 oz (106.6 kg)  07/20/12 242 lb 15.2 oz (110.2 kg)  06/12/12 242 lb 15.2 oz (110.2 kg)  04/24/10 270 lb (122.471 kg)  11/06/08 260 lb 12 oz (118.275 kg)     Body mass index is 30.24 kg/(m^2).Obesity Class I  Food/Nutrition Related Hx: Pt assessed by RD during admission on 08/30/12. Wt at that time 232# (105kg). Her current wt reflects continued severe wt loss 27#, 12% in less than one month. Diet hx includes poor appetite and inadequate oral intake.   Pt meets criteria for severe malnutrition in the context of chronic illness as evidenced by </=75% energy intake for >/= 1 month and continued wt loss 12% in <30 days and 25% in <6 months.    CMP     Component Value Date/Time   NA 136  09/24/2012 0428   K 2.9* 09/24/2012 0428   CL 104 09/24/2012 0428   CO2 24 09/24/2012 0428   GLUCOSE 108* 09/24/2012 0428   BUN 33* 09/24/2012 0428   CREATININE 1.48* 09/24/2012 0428   CALCIUM 8.3* 09/24/2012 0428   PROT 7.2 09/24/2012 0428   ALBUMIN 1.5* 09/24/2012 0428   AST 20 09/24/2012 0428   ALT 19 09/24/2012 0428   ALKPHOS 108 09/24/2012 0428   BILITOT 1.1 09/24/2012 0428   GFRNONAA 42* 09/24/2012 0428   GFRAA 49* 09/24/2012 0428   CBG (last 3)   Basename 09/23/12 1709  GLUCAP 90     Intake/Output Summary (Last 24 hours) at 09/24/12 1025 Last data filed at 09/24/12 0900  Gross per 24 hour  Intake   1755 ml  Output    400 ml  Net   1355 ml   Diet Order:  None available currently  Supplements/Tube Feeding:none at this time  IVF:    sodium chloride Last Rate: 75 mL/hr at 09/24/12 0900  DISCONTD: sodium chloride   DISCONTD: dextrose 5 % and 0.9% NaCl Last Rate: Stopped (09/24/12 0013)    Estimated Nutritional Needs:   Kcal:1650-1914 kcal Protein:90-100 gr Fluid:1 ml/kcal  NUTRITION DIAGNOSIS: -Inadequate oral intake (NI-2.1).  Status: Ongoing  RELATED TO:  increased nutrient needs, decreased appetite  AS EVIDENCE BY: Large Fibroid uterine mass, severe wt loss of 12% <30d, 25% < 6 months  MONITORING/EVALUATION(Goals): Monitor: diet advancement; po intake meals and supplements, labs and wt changes Goal: Pt to meet >/= 90% of their estimated nutrition needs; not met  EDUCATION NEEDS: -No education needs identified at this time  INTERVENTION: -When diet is advanced add Resource Breeze 237 ml  and ProStat 30 ml po TID -Add MVI daily -RD will follow for nutrition needs  Dietitian [phone]  DOCUMENTATION CODES Per approved criteria  -Severe malnutrition in the context of chronic illness    Francene Boyers 09/24/2012, 10:08 AM

## 2012-09-24 NOTE — Progress Notes (Signed)
Patient was seen earlier tonight after Rn called for low BP and drowsiness.   Patient was responsive. She denies any dizziness. She was confused about the date but knew where she was.   Lungs were clear to auscultation.  Cardiac exam was benign. Abdomen was soft. No focal neurological deficits were noted.  She was noted to be febrile.  Blood transfusion was held. Lactic acid was checked which was normal. She was given fluid bolus as this was felt to be hypovolemia. No active bleeding was present. ABg did not reveal hypercapnea. Fever is thought to be from UTI. Continue antibiotics.  After fever subsided transfusions were resumed.   She continued to be lethargic with low BP. She will be transferred to the step down unit for closer observation. Will give more fluids. Check CBC post transfusion.  [name] 1:51 AM

## 2012-09-24 NOTE — Progress Notes (Signed)
Patient to be transferred to stepdown

## 2012-09-24 NOTE — Progress Notes (Addendum)
New orders written to obtain body fluids for culture, also ok to transfuse patient at this time

## 2012-09-24 NOTE — Progress Notes (Signed)
Dr Gar Ponto in to consult on pt.  Possible endometrial biopsy per Dr Gar Ponto later this afternoon.  Nuclear med called for time pt to go down for thyriod uptake study. Informed that test can not be preformed due to residual from uptake med given last week as out pt that pt did not follow up with. Dr Fransico Him is awre of this.

## 2012-09-24 NOTE — Progress Notes (Signed)
Paged Dr. Rito Ehrlich to make aware of patient's BP 88/48, HR 66

## 2012-09-24 NOTE — Clinical Social Work Psychosocial (Signed)
    Clinical Social Work Department BRIEF PSYCHOSOCIAL ASSESSMENT 09/24/2012  Patient:  [name], [name]     Account Number:  [id number]     Admit date:  09/23/2012  Clinical Social Worker:  Santa Genera, CLINICAL SOCIAL WORKER  Date/Time:  09/24/2012 11:00 AM  Referred by:  RN  Date Referred:  09/24/2012 Referred for  Transportation assistance   Other Referral:   Interview type:  Patient Other interview type:    PSYCHOSOCIAL DATA Living Status:  FAMILY Admitted from facility:   Level of care:   Primary support name:  Nathaniel Man Primary support relationship to patient:  SPOUSE Degree of support available:   Limited    CURRENT CONCERNS Current Concerns  Other - See comment   Other Concerns:   Transportation assistance    SOCIAL WORK ASSESSMENT / PLAN CSW met w patient at bedside.  RN expressed concern that patient was unable to obtain medical care and get to medical appointments, resulting in decline in health.  CSW met w patient, patient says husband is not consistent in his help.  Last week, he refused to help patient schedule appointment and would not assist w driving.  Patient is able to drive herself, but was too weak to be able to do so last week.  Patient states that her pastor has become involved and is working w husband to ensure that he assists patient.  Renato Gails will also become more involved w driving patient and scheduling appointments when/if patient is unable to do this for herself.    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  Husband is also employed.   Assessment/plan status:  Psychosocial Support/Ongoing Assessment of Needs Other assessment/ plan:   Information/referral to community resources:   [facility] transportation information  [employer] guide    PATIENT'S/FAMILY'S RESPONSE TO PLAN OF CARE: Appreciative    Santa Genera, LCSW Clinical Social Worker [phone])

## 2012-09-24 NOTE — Progress Notes (Signed)
Patient ID: [name], female   DOB: 04-Aug-1971, 41 y.o.   MRN: [id number]

## 2012-09-24 NOTE — Progress Notes (Signed)
Have spoken at length to Dr. Emelda Fear about this patient. Dr. Emelda Fear thinks that the best course of action may will be to do surgery on the fibroid uterus now while she is in the hospital. Her posttransfusion hemoglobin is 9.5. I will give her further 2 units blood transfusion to optimize oxygen-carrying capacity for upcoming extensive surgery on the fibroid uterus.

## 2012-09-24 NOTE — Progress Notes (Signed)
Subjective: This lady was admitted yesterday with significant anemia and in a very acutely dehydrated state. Overnight she became somewhat hypotensive and apparently more confused and had to be moved to the step down unit. She has been given 2 units blood transfusion and feels much improved. Her hydration also feels better.           Physical Exam: Blood pressure 105/52, pulse 79, temperature 97.6 F (36.4 C), temperature source Oral, resp. rate 26, height 5\' 9"  (1.753 m), weight 92.9 kg (204 lb 12.9 oz), last menstrual period 09/18/2012, SpO2 100.00%. She does look better than yesterday but clearly is better hydrated now. Heart sounds are present and normal without any murmurs. Jugular venous pressure not elevated. Lung fields are entirely clear. There is no evidence of heart failure. She continues to have exophthalmos. Her abdomen is soft with a large uterine fibroid tumor as before. She is alert and oriented.   Investigations:  Recent Results (from the past 240 hour(s))  CULTURE, BLOOD (ROUTINE X 2)     Status: Normal (Preliminary result)   Collection Time   09/23/12  8:39 PM      Component Value Range Status Comment   Specimen Description BLOOD LEFT HAND   Final    Special Requests BOTTLES DRAWN AEROBIC AND ANAEROBIC 6CC   Final    Culture NO GROWTH 1 DAY   Final    Report Status PENDING   Incomplete   CULTURE, BLOOD (ROUTINE X 2)     Status: Normal (Preliminary result)   Collection Time   09/23/12  8:41 PM      Component Value Range Status Comment   Specimen Description BLOOD LEFT ANTECUBITAL   Final    Special Requests BOTTLES DRAWN AEROBIC AND ANAEROBIC 6CC   Final    Culture NO GROWTH 1 DAY   Final    Report Status PENDING   Incomplete      Basic Metabolic Panel:  Basename 09/24/12 0428 09/23/12 1104  NA 136 131*  K 2.9* 3.5  CL 104 96  CO2 24 26  GLUCOSE 108* 112*  BUN 33* 39*  CREATININE 1.48* 1.77*  CALCIUM 8.3* 9.6  MG -- --  PHOS -- --   Liver  Function Tests:  Basename 09/24/12 0428 09/23/12 1415  AST 20 33  ALT 19 26  ALKPHOS 108 135*  BILITOT 1.1 1.6*  PROT 7.2 8.2  ALBUMIN 1.5* 1.8*     CBC:  Basename 09/24/12 0428 09/23/12 1104  WBC 7.9 10.4  NEUTROABS -- --  HGB 7.8* 8.3*  HCT 24.7* 25.7*  MCV 77.7* 76.0*  PLT 299 403*        Medications: I have reviewed the patient's current medications.  Impression: 1. Acute dehydration, resolving. 2. Chronic kidney disease. 3. Chronic blood loss anemia, status post 2 units blood transfusion. 4. Massive uterine fibroid causing #3. 5. Graves' disease.     Plan: 1. Continue with current therapy. 2. Reduce IV fluids a little. 3. Continue with workup and treatment for Graves' disease on this admission. It will be useful to get this patient treated whilst in the hospital as her compliance is rather poor health side of the hospital. 4. OB/GYN consultation. I wonder whether she needs further injection of Lupron to shrink the uterine fibroid further. She apparently did have a Lupron injection approximately one month ago when she was in hospital then. Dr. Emelda Fear knows this patient very well and we will ask him to see  her.     LOS: 1 day   Wilson Singer Pager [phone]  09/24/2012, 8:27 AM

## 2012-09-24 NOTE — Progress Notes (Signed)
#  1 unit prbc completed w/o complication. IV Flagyl and rocephin will be given as ordered before 2nd unit pbrc started

## 2012-09-24 NOTE — Consult Note (Signed)
Reason for Consult:Fibroid uterus, Vaginal discharge Referring Physician: Christoper Allegra, MD Hospitalist service.  [name] is an 41 y.o. female well known to me due to her huge fibroid uterus, currently undergoing Lupron suppression with plans to remove when she is a surgical candidate.  She is a poor historian and misinterprets cause/effect of illnesses, and has significant transportation difficulties, which have made care difficult. She failed to keep appointment for Thyroid scan last week arranged by Dr Fransico Him, as he tried to evaluate her for possible thyroid ablation. She has not kept appointments in [facility] for arranged outpatient referral in June. Currently she is admitted for severe anemia, malnutrition, and has received transfusion x 4 units; she has low albumin suggesting chronic disease.     Pertinent Gynecological History: Menses: flow is moderate Bleeding: none at present, suppressed 3 weeks ago with Lupron 3.75mg  IM  Contraception: tubal ligation DES exposure: unknown Blood transfusions: 4 units this hospitalizn, 2 prior transfusions this summer of 2, then 4 units prbc Sexually transmitted diseases: no past history and recent diagnosis: Bact vaginosis, possible trichomonas testing pending Previous GYN Procedures: Unable to access cervix adequate for endometrial biopsy as outpt  Last mammogram: none to date. Date:  Last pap: normal Date: June 2013 OB History: G, P   Menstrual History: Menarche age:  Patient's last menstrual period was 09/18/2012.    Past Medical History  Diagnosis Date  . Thyroid disease   . Fibroid tumor     History reviewed. No pertinent past surgical history.  History reviewed. No pertinent family history.  Social History:  reports that she has never smoked. She has never used smokeless tobacco. She reports that she does not drink alcohol or use illicit drugs. She is married, does NOT live c husband, but is dependent on him for transportation,  and allleges he is unreliable for transport, resulting in recent missed appts  Allergies: No Known Allergies  Medications: I have reviewed the patient's current medications.  ROS Low grade fever upon admit. Currently on Rocephin Heavy vag discharge.   Blood pressure 101/58, pulse 81, temperature 98.1 F (36.7 C), temperature source Oral, resp. rate 22, height 5\' 9"  (1.753 m), weight 92.9 kg (204 lb 12.9 oz), last menstrual period 09/18/2012, SpO2 100.00%. Physical Exam Physical Examination: General appearance - oriented to person, place, and time, chronically ill appearing and uncooperative Mental status - alert, oriented to person, place, and time, depressed mood, , drowsy, She becomes minimally responsive at times, but then responds appropriately to questions Eyes - pupils equal and reactive, extraocular eye movements intact, marked exophthalmos, with erythematous conjunctiva, no purulence Abdomen - soft, , noted uterine enlargement equivalent to 8-9 month sized uterus.  The uterus is actually smaller than it was last month. bowel sounds no rebound tenderness.  She allows abdominal palpation of the large fibroid uterus and specifically denies discomfort.  Pelvic  - VULVA: vulvar excoriation heavy vag discharge, Foley catheter in place , VAGINA: , vaginal discharge - yellow, creamy, frothy, malodorous and vaginal erythema noted,  WET MOUNT done - results: PENDING, DNA probe for chlamydia and GC obtained, CERVIX: VERY ANTERIOR, high in pelvis, feels normal to bimanual Patient uncomfortable but I believe it is due to vaginal and vulvar excoriation, unable to visualize cervix , UTERUS: uterus is normal size, shape, consistency and nontender, mass effect of uterus, enlarged to 36+ week's size, which is actually smaller than 3 weeks ago, ADNEXA:no masses on recent ultrasound, exam chaperoned by ICU RN Extremities - peripheral  pulses normal, no pedal edema, no clubbing or cyanosis, pedal edema 3+,   Improved from last visit, toes have reduced in swelling, shins and feet scaly and desquamating.  Skin warm,   Results for orders placed during the hospital encounter of 09/23/12 (from the past 48 hour(s))  CBC     Status: Abnormal   Collection Time   09/23/12 11:04 AM      Component Value Range Comment   WBC 10.4  4.0 - 10.5 K/uL    RBC 3.38 (*) 3.87 - 5.11 MIL/uL    Hemoglobin 8.3 (*) 12.0 - 15.0 g/dL    HCT 40.9 (*) 81.1 - 46.0 %    MCV 76.0 (*) 78.0 - 100.0 fL    MCH 24.6 (*) 26.0 - 34.0 pg    MCHC 32.3  30.0 - 36.0 g/dL    RDW 91.4 (*) 78.2 - 15.5 %    Platelets 403 (*) 150 - 400 K/uL   BASIC METABOLIC PANEL     Status: Abnormal   Collection Time   09/23/12 11:04 AM      Component Value Range Comment   Sodium 131 (*) 135 - 145 mEq/L    Potassium 3.5  3.5 - 5.1 mEq/L    Chloride 96  96 - 112 mEq/L    CO2 26  19 - 32 mEq/L    Glucose, Bld 112 (*) 70 - 99 mg/dL    BUN 39 (*) 6 - 23 mg/dL    Creatinine, Ser 9.56 (*) 0.50 - 1.10 mg/dL    Calcium 9.6  8.4 - 21.3 mg/dL    GFR calc non Af Amer 34 (*) >90 mL/min    GFR calc Af Amer 40 (*) >90 mL/min   PROTIME-INR     Status: Abnormal   Collection Time   09/23/12  2:15 PM      Component Value Range Comment   Prothrombin Time 16.8 (*) 11.6 - 15.2 seconds    INR 1.40  0.00 - 1.49   APTT     Status: Normal   Collection Time   09/23/12  2:15 PM      Component Value Range Comment   aPTT 33  24 - 37 seconds   TSH     Status: Normal   Collection Time   09/23/12  2:15 PM      Component Value Range Comment   TSH 1.213  0.350 - 4.500 uIU/mL   HEPATIC FUNCTION PANEL     Status: Abnormal   Collection Time   09/23/12  2:15 PM      Component Value Range Comment   Total Protein 8.2  6.0 - 8.3 g/dL    Albumin 1.8 (*) 3.5 - 5.2 g/dL    AST 33  0 - 37 U/L    ALT 26  0 - 35 U/L    Alkaline Phosphatase 135 (*) 39 - 117 U/L    Total Bilirubin 1.6 (*) 0.3 - 1.2 mg/dL    Bilirubin, Direct 1.0 (*) 0.0 - 0.3 mg/dL    Indirect Bilirubin 0.6  0.3  - 0.9 mg/dL   FIBRINOGEN     Status: Abnormal   Collection Time   09/23/12  2:15 PM      Component Value Range Comment   Fibrinogen 585 (*) 204 - 475 mg/dL   TYPE AND SCREEN     Status: Normal (Preliminary result)   Collection Time   09/23/12  2:16 PM      Component Value  Range Comment   ABO/RH(D) O POS      Antibody Screen NEG      Sample Expiration 09/26/2012      Unit Number [id number]      Blood Component Type RED CELLS,LR      Unit division 00      Status of Unit ISSUED      Transfusion Status OK TO TRANSFUSE      Crossmatch Result Compatible      Unit Number [id number]      Blood Component Type RED CELLS,LR      Unit division 00      Status of Unit ISSUED      Transfusion Status OK TO TRANSFUSE      Crossmatch Result Compatible      Unit Number [id number]      Blood Component Type RED CELLS,LR      Unit division 00      Status of Unit ALLOCATED      Transfusion Status OK TO TRANSFUSE      Crossmatch Result Compatible      Unit Number [id number]      Blood Component Type RED CELLS,LR      Unit division 00      Status of Unit ISSUED      Transfusion Status OK TO TRANSFUSE      Crossmatch Result Compatible     PREPARE RBC (CROSSMATCH)     Status: Normal   Collection Time   09/23/12  2:17 PM      Component Value Range Comment   Order Confirmation ORDER PROCESSED BY BLOOD BANK     URINALYSIS, ROUTINE W REFLEX MICROSCOPIC     Status: Abnormal   Collection Time   09/23/12  2:25 PM      Component Value Range Comment   Color, Urine AMBER (*) YELLOW BIOCHEMICALS MAY BE AFFECTED BY COLOR   APPearance CLOUDY (*) CLEAR    Specific Gravity, Urine 1.010  1.005 - 1.030    pH 6.0  5.0 - 8.0    Glucose, UA NEGATIVE  NEGATIVE mg/dL    Hgb urine dipstick LARGE (*) NEGATIVE    Bilirubin Urine SMALL (*) NEGATIVE    Ketones, ur NEGATIVE  NEGATIVE mg/dL    Protein, ur 30 (*) NEGATIVE mg/dL    Urobilinogen, UA 2.0 (*) 0.0 - 1.0 mg/dL    Nitrite NEGATIVE  NEGATIVE     Leukocytes, UA LARGE (*) NEGATIVE   URINE MICROSCOPIC-ADD ON     Status: Abnormal   Collection Time   09/23/12  2:25 PM      Component Value Range Comment   Squamous Epithelial / LPF RARE  RARE    WBC, UA TOO NUMEROUS TO COUNT  <3 WBC/hpf    RBC / HPF 11-20  <3 RBC/hpf    Bacteria, UA MANY (*) RARE   TSH     Status: Normal   Collection Time   09/23/12  3:29 PM      Component Value Range Comment   TSH 1.406  0.350 - 4.500 uIU/mL   T4, FREE     Status: Normal   Collection Time   09/23/12  3:29 PM      Component Value Range Comment   Free T4 1.24  0.80 - 1.80 ng/dL   T3, FREE     Status: Abnormal   Collection Time   09/23/12  3:29 PM      Component Value Range Comment   T3, Free 2.1 (*)  2.3 - 4.2 pg/mL   HEMOGLOBIN A1C     Status: Normal   Collection Time   09/23/12  4:20 PM      Component Value Range Comment   Hemoglobin A1C 5.4  <5.7 %    Mean Plasma Glucose 108  <117 mg/dL   GLUCOSE, CAPILLARY     Status: Normal   Collection Time   09/23/12  5:09 PM      Component Value Range Comment   Glucose-Capillary 90  70 - 99 mg/dL   CULTURE, BLOOD (ROUTINE X 2)     Status: Normal (Preliminary result)   Collection Time   09/23/12  8:39 PM      Component Value Range Comment   Specimen Description BLOOD LEFT HAND      Special Requests BOTTLES DRAWN AEROBIC AND ANAEROBIC 6CC      Culture NO GROWTH 1 DAY      Report Status PENDING     CULTURE, BLOOD (ROUTINE X 2)     Status: Normal (Preliminary result)   Collection Time   09/23/12  8:41 PM      Component Value Range Comment   Specimen Description BLOOD LEFT ANTECUBITAL      Special Requests BOTTLES DRAWN AEROBIC AND ANAEROBIC 6CC      Culture NO GROWTH 1 DAY      Report Status PENDING     LACTIC ACID, PLASMA     Status: Normal   Collection Time   09/23/12  8:41 PM      Component Value Range Comment   Lactic Acid, Venous 1.0  0.5 - 2.2 mmol/L   BLOOD GAS, ARTERIAL     Status: Abnormal   Collection Time   09/23/12  8:45 PM       Component Value Range Comment   O2 Content 2.0      Delivery systems NASAL CANNULA      pH, Arterial 7.470 (*) 7.350 - 7.450    pCO2 arterial 33.6 (*) 35.0 - 45.0 mmHg    pO2, Arterial 111.0 (*) 80.0 - 100.0 mmHg    Bicarbonate 24.2 (*) 20.0 - 24.0 mEq/L    TCO2 23.0  0 - 100 mmol/L    Acid-Base Excess 0.9  0.0 - 2.0 mmol/L    O2 Saturation 98.5      Patient temperature 37.0      Collection site RIGHT RADIAL      Drawn by [id number]      Sample type ARTERIAL      Allens test (pass/fail) PASS  PASS   MRSA PCR SCREENING     Status: Normal   Collection Time   09/24/12  2:44 AM      Component Value Range Comment   MRSA by PCR NEGATIVE  NEGATIVE   COMPREHENSIVE METABOLIC PANEL     Status: Abnormal   Collection Time   09/24/12  4:28 AM      Component Value Range Comment   Sodium 136  135 - 145 mEq/L    Potassium 2.9 (*) 3.5 - 5.1 mEq/L DELTA CHECK NOTED   Chloride 104  96 - 112 mEq/L    CO2 24  19 - 32 mEq/L    Glucose, Bld 108 (*) 70 - 99 mg/dL    BUN 33 (*) 6 - 23 mg/dL    Creatinine, Ser 7.84 (*) 0.50 - 1.10 mg/dL    Calcium 8.3 (*) 8.4 - 10.5 mg/dL    Total Protein 7.2  6.0 - 8.3 g/dL  Albumin 1.5 (*) 3.5 - 5.2 g/dL    AST 20  0 - 37 U/L    ALT 19  0 - 35 U/L    Alkaline Phosphatase 108  39 - 117 U/L    Total Bilirubin 1.1  0.3 - 1.2 mg/dL    GFR calc non Af Amer 42 (*) >90 mL/min    GFR calc Af Amer 49 (*) >90 mL/min   CBC     Status: Abnormal   Collection Time   09/24/12  4:28 AM      Component Value Range Comment   WBC 7.9  4.0 - 10.5 K/uL    RBC 3.18 (*) 3.87 - 5.11 MIL/uL    Hemoglobin 7.8 (*) 12.0 - 15.0 g/dL    HCT 16.1 (*) 09.6 - 46.0 %    MCV 77.7 (*) 78.0 - 100.0 fL    MCH 24.5 (*) 26.0 - 34.0 pg    MCHC 31.6  30.0 - 36.0 g/dL    RDW 04.5 (*) 40.9 - 15.5 %    Platelets 299  150 - 400 K/uL DELTA CHECK NOTED  PREPARE RBC (CROSSMATCH)     Status: Normal   Collection Time   09/24/12  5:00 AM      Component Value Range Comment   Order Confirmation ORDER PROCESSED  BY BLOOD BANK     CBC     Status: Abnormal   Collection Time   09/24/12  9:43 AM      Component Value Range Comment   WBC 8.7  4.0 - 10.5 K/uL    RBC 3.75 (*) 3.87 - 5.11 MIL/uL    Hemoglobin 9.5 (*) 12.0 - 15.0 g/dL DELTA CHECK NOTED   HCT 28.9 (*) 36.0 - 46.0 %    MCV 77.1 (*) 78.0 - 100.0 fL    MCH 25.3 (*) 26.0 - 34.0 pg    MCHC 32.9  30.0 - 36.0 g/dL    RDW 81.1 (*) 91.4 - 15.5 %    Platelets 307  150 - 400 K/uL     No results found.  Assessment/Plan: Severe malnutrition, poor overall health. Hyperthyroidism, being evaluated Fibroid uterus responding to Lupron Vaginal discharge, labs pending Anemia,  Being transfused 3rd and 4th units today  Plan :1.will see how much pt improves over weekend to decide if she is surgical candidate here this hospitalization or needs re-referral. She would likely need inpatient transfer if transfer required. 2.  Begun on I.V. Metronidazole to address severe BV. And cover for trich(suspected) 3. Follow with you.       Tilda Burrow 09/24/2012

## 2012-09-25 ENCOUNTER — Inpatient Hospital Stay (HOSPITAL_COMMUNITY): Payer: 59

## 2012-09-25 DIAGNOSIS — D649 Anemia, unspecified: Secondary | ICD-10-CM

## 2012-09-25 LAB — BASIC METABOLIC PANEL
BUN: 20 mg/dL (ref 6–23)
CO2: 24 mEq/L (ref 19–32)
Chloride: 103 mEq/L (ref 96–112)
Glucose, Bld: 101 mg/dL — ABNORMAL HIGH (ref 70–99)
Potassium: 3.4 mEq/L — ABNORMAL LOW (ref 3.5–5.1)

## 2012-09-25 LAB — COMPREHENSIVE METABOLIC PANEL
ALT: 17 U/L (ref 0–35)
AST: 17 U/L (ref 0–37)
Albumin: 1.6 g/dL — ABNORMAL LOW (ref 3.5–5.2)
CO2: 24 mEq/L (ref 19–32)
Calcium: 8.6 mg/dL (ref 8.4–10.5)
Chloride: 105 mEq/L (ref 96–112)
GFR calc non Af Amer: 55 mL/min — ABNORMAL LOW (ref >90)
Sodium: 136 mEq/L (ref 135–145)

## 2012-09-25 LAB — TYPE AND SCREEN: Unit division: 0

## 2012-09-25 LAB — CBC
Platelets: 316 10*3/uL (ref 150–400)
RBC: 4.28 MIL/uL (ref 3.87–5.11)
RDW: 19.9 % — ABNORMAL HIGH (ref 11.5–15.5)
WBC: 11.6 10*3/uL — ABNORMAL HIGH (ref 4.0–10.5)

## 2012-09-25 MED ORDER — PIPERACILLIN-TAZOBACTAM 3.375 G IVPB
3.3750 g | Freq: Three times a day (TID) | INTRAVENOUS | Status: DC
  Administered 2012-09-25 – 2012-09-29 (×12): 3.375 g via INTRAVENOUS
  Filled 2012-09-25 (×13): qty 50

## 2012-09-25 MED ORDER — SODIUM CHLORIDE 0.9 % IJ SOLN
INTRAMUSCULAR | Status: AC
  Filled 2012-09-25: qty 3

## 2012-09-25 MED ORDER — SODIUM CHLORIDE 0.9 % IV SOLN: INTRAVENOUS | Status: DC

## 2012-09-25 MED ORDER — IOHEXOL 300 MG/ML  SOLN
100.0000 mL | Freq: Once | INTRAMUSCULAR | Status: AC | PRN
  Administered 2012-09-25: 100 mL via INTRAVENOUS

## 2012-09-25 MED ORDER — BOOST / RESOURCE BREEZE PO LIQD
1.0000 | Freq: Three times a day (TID) | ORAL | Status: DC
  Administered 2012-09-25 – 2012-09-29 (×5): 1 via ORAL
  Filled 2012-09-25 (×16): qty 1

## 2012-09-25 MED ORDER — POTASSIUM CHLORIDE 10 MEQ/100ML IV SOLN
10.0000 meq | INTRAVENOUS | Status: AC
  Administered 2012-09-25 – 2012-09-26 (×4): 10 meq via INTRAVENOUS
  Filled 2012-09-25: qty 100
  Filled 2012-09-25: qty 200

## 2012-09-25 MED ORDER — POTASSIUM CHLORIDE 10 MEQ/100ML IV SOLN
INTRAVENOUS | Status: AC
  Administered 2012-09-26: 09:00:00
  Filled 2012-09-25: qty 100

## 2012-09-25 NOTE — Progress Notes (Signed)
Subjective: Patient reports + flatus and + BM.    Objective:CT from this afternoon reviewed. Patient has central necrosis of Fibroid in the uterus, and reactive lymphadenopathy. I have reviewed patient's vital signs, labs and potassium low at 3.4 noted..  General: alert, fatigued and moderate distress able to use bedside commode. Tolerating p.o. Protein supplements.    Assessment/Plan: Will plan to stabilize further tomorrow, and plan for abdominal hysterectomy Monday. She is scheduled for 10:00 a.m. 4 runs of 10 MEq potassium ordered.  LOS: 2 days    Mari Battaglia V 09/25/2012, 9:44 PM

## 2012-09-25 NOTE — Progress Notes (Signed)
Brief Nutrition Note  Received consult to assess nutrition requirements and status. Pt was last assessed yesterday, 09/24/12. Pt was NPO at time of visit, but has now upgraded to a regular diet. NO PO intake data currently available. Reviewed RD note. Will add Resource Breeze po TID, each supplement provides 250 kcal and 9 grams of protein now that diet has advanced. Will continue to monitor.   Melody Haver, RD, LDN Pager: [phone]

## 2012-09-25 NOTE — Progress Notes (Signed)
Pt has gone to radiology for ct of abdomen and pelvis w/ constrast.trarnsported  in bed d/t extreme  weakness.

## 2012-09-25 NOTE — Progress Notes (Signed)
[facility] PROGRESS NOTE  [name] ZOX:[id number] DOB: [birth year] DOA: 09/23/2012 PCP: Tilda Burrow, MD  Assessment/Plan: Principal Problem:  *Dehydration Active Problems:  Fibroid uterus  Graves disease  Chronic blood loss anemia  Chronic kidney disease  1. Dehydration. Improving with IV fluids 2. Acute renal failure secondary to dehydration. Improved with IV fluids 3. Anemia due to chronic blood loss from uterine fibroids. Patient has been transfused a total of 4 units of packed red blood cells 4. Fever likely due to bacterial vaginosis. Likely secondary to vaginal infection. Urine culture did not show any growth. Wet prep for chlamydia and gonorrhea were found to be negative. Blood cultures have been negative. We'll check a chest x-ray to rule out any developing pneumonia. Patient is on appropriate antibiotic coverage for any vaginal source of infection. 5. Graves' disease. Patient has had poor followup in the outpatient setting for thyroid uptake scan. Endocrinology is following. 6. Uterine fibroids. Appreciate gynecology assistance. She may need to have these removed during this hospitalization. 7. Severe protein calorie malnutrition. Nutrition consult.  Code Status: Full code Family Communication: discussed with patient Disposition Plan: discharge home when medically stable   Brief narrative: Slightly was admitted to the hospital with significant anemia and was acutely dehydrated. She was started on IV fluids and was given a blood transfusion. During her hospital stay she became more confused and somewhat hypotensive requiring transfer to the step down unit. Currently she is being evaluated for her uterine fibroids an underlying bacterial vaginosis. She does have a history of noncompliance and has not undergone outpatient workup for her hyperthyroidism.  Consultants:  Gynecology, Dr. Emelda Fear  Endocrinology, Dr.  Fransico Him  Procedures:  none  Antibiotics:  Rocephin  flagyl  HPI/Subjective: Feeling better today, no shortness of breath or cough, no vomiting, she says she is having diarrhea but nursing staff reports that this is actually copious vaginal secretions. These secretions are malodorous  Objective: Filed Vitals:   09/25/12 0700 09/25/12 0736 09/25/12 0741 09/25/12 0800  BP: 109/67 113/66  112/65  Pulse: 95 95  95  Temp:   99.3 F (37.4 C)   TempSrc:      Resp: 18 25  21   Height:      Weight:      SpO2: 96% 96%  96%    Intake/Output Summary (Last 24 hours) at 09/25/12 0920 Last data filed at 09/25/12 0600  Gross per 24 hour  Intake   2755 ml  Output    850 ml  Net   1905 ml   Filed Weights   09/23/12 1703 09/25/12 0416  Weight: 92.9 kg (204 lb 12.9 oz) 97.1 kg (214 lb 1.1 oz)    Exam:   General:  NAD  Cardiovascular: s1, s2, rrr  Respiratory: cta b  Abdomen: soft, distended, bs+  Data Reviewed: Basic Metabolic Panel:  Lab 09/25/12 8119 09/24/12 0428 09/23/12 1104  NA 136 136 131*  K 3.5 2.9* 3.5  CL 105 104 96  CO2 24 24 26   GLUCOSE 100* 108* 112*  BUN 21 33* 39*  CREATININE 1.20* 1.48* 1.77*  CALCIUM 8.6 8.3* 9.6  MG -- -- --  PHOS -- -- --   Liver Function Tests:  Lab 09/25/12 0508 09/24/12 0428 09/23/12 1415  AST 17 20 33  ALT 17 19 26   ALKPHOS 129* 108 135*  BILITOT 1.0 1.1 1.6*  PROT 8.1 7.2 8.2  ALBUMIN 1.6* 1.5* 1.8*   No results found for this basename: LIPASE:5,AMYLASE:5 in  the last 168 hours No results found for this basename: AMMONIA:5 in the last 168 hours CBC:  Lab 09/25/12 0508 09/24/12 0943 09/24/12 0428 09/23/12 1104  WBC 11.6* 8.7 7.9 10.4  NEUTROABS -- -- -- --  HGB 11.0* 9.5* 7.8* 8.3*  HCT 33.1* 28.9* 24.7* 25.7*  MCV 77.3* 77.1* 77.7* 76.0*  PLT 316 307 299 403*   Cardiac Enzymes: No results found for this basename: CKTOTAL:5,CKMB:5,CKMBINDEX:5,TROPONINI:5 in the last 168 hours BNP (last 3 results)  Basename  08/28/12 2145  PROBNP 617.9*   CBG:  Lab 09/23/12 1709  GLUCAP 90    Recent Results (from the past 240 hour(s))  URINE CULTURE     Status: Normal   Collection Time   09/23/12  2:25 PM      Component Value Range Status Comment   Specimen Description URINE, CLEAN CATCH   Final    Special Requests NONE   Final    Culture  Setup Time 09/24/2012 01:05   Final    Colony Count NO GROWTH   Final    Culture NO GROWTH   Final    Report Status 09/24/2012 FINAL   Final   CULTURE, BLOOD (ROUTINE X 2)     Status: Normal (Preliminary result)   Collection Time   09/23/12  8:39 PM      Component Value Range Status Comment   Specimen Description BLOOD LEFT HAND   Final    Special Requests BOTTLES DRAWN AEROBIC AND ANAEROBIC 6CC   Final    Culture NO GROWTH 2 DAYS   Final    Report Status PENDING   Incomplete   CULTURE, BLOOD (ROUTINE X 2)     Status: Normal (Preliminary result)   Collection Time   09/23/12  8:41 PM      Component Value Range Status Comment   Specimen Description BLOOD LEFT ANTECUBITAL   Final    Special Requests BOTTLES DRAWN AEROBIC AND ANAEROBIC 6CC   Final    Culture NO GROWTH 2 DAYS   Final    Report Status PENDING   Incomplete   MRSA PCR SCREENING     Status: Normal   Collection Time   09/24/12  2:44 AM      Component Value Range Status Comment   MRSA by PCR NEGATIVE  NEGATIVE Final   WET PREP, GENITAL     Status: Abnormal   Collection Time   09/24/12  2:35 PM      Component Value Range Status Comment   Yeast Wet Prep HPF POC NONE SEEN  NONE SEEN Final    Trich, Wet Prep NONE SEEN  NONE SEEN Final    Clue Cells Wet Prep HPF POC RARE (*) NONE SEEN Final    WBC, Wet Prep HPF POC TOO NUMEROUS TO COUNT (*) NONE SEEN Final      Studies: No results found.  Scheduled Meds:   . antiseptic oral rinse  15 mL Mouth Rinse BID  . cefTRIAXone (ROCEPHIN)  IV  1 g Intravenous Q24H  . metronidazole  500 mg Intravenous Q12H  . multivitamin  5 mL Oral Daily  . potassium  chloride  40 mEq Oral Once   Continuous Infusions:   . sodium chloride 50 mL/hr at 09/24/12 1200    Principal Problem:  *Dehydration Active Problems:  Fibroid uterus  Graves disease  Chronic blood loss anemia  Chronic kidney disease    Time spent: 25 mins    Chaske Paskett  [facility] Pager [phone]. If  7PM-7AM, please contact night-coverage at [email or web address], password [facility] 09/25/2012, 9:20 AM  LOS: 2 days

## 2012-09-25 NOTE — Progress Notes (Signed)
Subjective: Patient reports tolerating PO and + BM.  She remains in ill health, and the leukorrhea has increased in volume.  She has developed a low grade fever to 100.6 and wbc increased to 11.6, despite Rocephin and Metronidazole.  Objective: I have reviewed patient's vital signs, medications and labs.  General: alert, cachectic and moderate distress GI: normal findings: bowel sounds normal and the enlarged uterus is described as nontender to direct palpation, no guarding or rebound, abnormal findings:   and Mass effect of uterus is approximately 10 cm above umbilicus Extremities: edema 3+ , improved from earlier Vaginal Bleeding: no bleeding, but profuse leukorrhea with a lite pink tinge  CBC    Component Value Date/Time   WBC 11.6* 09/25/2012 0508   RBC 4.28 09/25/2012 0508   HGB 11.0* 09/25/2012 0508   HCT 33.1* 09/25/2012 0508   PLT 316 09/25/2012 0508   MCV 77.3* 09/25/2012 0508   MCH 25.7* 09/25/2012 0508   MCHC 33.2 09/25/2012 0508   RDW 19.9* 09/25/2012 0508   LYMPHSABS 1.1 08/31/2012 0507   MONOABS 0.6 08/31/2012 0507   EOSABS 0.1 08/31/2012 0507   BASOSABS 0.0 08/31/2012 0507  CMP  Notable for improved Creatinine, down to 1.2 , and severely depleted albumin of 1.6  Assessment/Plan: I suspect she has infarcted part of the fibroid mass in the uterus and the central liquefaction is draining vaginally. Plan. 1. CT pelvis ordered 2. Change antibiotics to Zosyn to broaden coverage 3. Patient may need PICC line to allow hyperalimentation and reliable IV access for surgery.   \ LOS: 2 days    Reilly Blades V 09/25/2012, 2:17 PM

## 2012-09-26 ENCOUNTER — Inpatient Hospital Stay (HOSPITAL_COMMUNITY): Payer: 59

## 2012-09-26 LAB — CBC
HCT: 33.3 % — ABNORMAL LOW (ref 36.0–46.0)
Hemoglobin: 10.6 g/dL — ABNORMAL LOW (ref 12.0–15.0)
MCH: 24.7 pg — ABNORMAL LOW (ref 26.0–34.0)
MCHC: 31.8 g/dL (ref 30.0–36.0)
MCV: 77.4 fL — ABNORMAL LOW (ref 78.0–100.0)
RBC: 4.3 MIL/uL (ref 3.87–5.11)

## 2012-09-26 LAB — BASIC METABOLIC PANEL
BUN: 15 mg/dL (ref 6–23)
CO2: 22 mEq/L (ref 19–32)
Calcium: 8.6 mg/dL (ref 8.4–10.5)
GFR calc non Af Amer: 61 mL/min — ABNORMAL LOW (ref >90)
Glucose, Bld: 104 mg/dL — ABNORMAL HIGH (ref 70–99)

## 2012-09-26 MED ORDER — POTASSIUM CHLORIDE IN NACL 40-0.9 MEQ/L-% IV SOLN
INTRAVENOUS | Status: DC
  Administered 2012-09-26: 18:00:00 via INTRAVENOUS
  Filled 2012-09-26 (×3): qty 1000

## 2012-09-26 MED ORDER — SODIUM CHLORIDE 0.9 % IJ SOLN
INTRAMUSCULAR | Status: AC
  Administered 2012-09-26: 10 mL
  Filled 2012-09-26: qty 3

## 2012-09-26 MED ORDER — ALBUMIN HUMAN 25 % IV SOLN
25.0000 g | Freq: Three times a day (TID) | INTRAVENOUS | Status: DC
  Administered 2012-09-26: 25 g via INTRAVENOUS
  Filled 2012-09-26 (×4): qty 100

## 2012-09-26 MED ORDER — SODIUM CHLORIDE 0.9 % IV SOLN: INTRAVENOUS | Status: DC

## 2012-09-26 MED ORDER — DIPHENHYDRAMINE HCL 25 MG PO CAPS
25.0000 mg | ORAL_CAPSULE | Freq: Once | ORAL | Status: AC
  Administered 2012-09-26: 25 mg via ORAL
  Filled 2012-09-26: qty 1

## 2012-09-26 MED ORDER — SODIUM CHLORIDE 0.9 % IJ SOLN
10.0000 mL | Freq: Two times a day (BID) | INTRAMUSCULAR | Status: DC
  Administered 2012-09-26 – 2012-09-27 (×2): 10 mL
  Filled 2012-09-26: qty 3

## 2012-09-26 MED ORDER — SODIUM CHLORIDE 0.9 % IJ SOLN
10.0000 mL | INTRAMUSCULAR | Status: DC | PRN
  Administered 2012-09-26: 20 mL
  Filled 2012-09-26: qty 6

## 2012-09-26 NOTE — Progress Notes (Signed)
Subjective: Patient reports no acute change in status.  Tolerating Boost protein supplements, Vaginal discharge continues. No nausea or vomiting Objective: I have reviewed patient's vital signs, medications and labs. Hgb/Hct stable, potassium now 3.2 despite 4 runs of 10 mEq KCl. General: alert, cooperative, appears older than stated age, fatigued, no distress and appropriate in questions, ready to have surgery.   GI: normal findings: Fibroid uterus nontender, abd soft without guarding or rebound Vaginal Bleeding: no bleeding but vaginal discharge continues.   Assessment/Plan: Assessment/Plan:  Principal Problem:  Dehydration  Active Problems:  Fibroid uterus  Graves disease  Chronic blood loss anemia  Chronic kidney disease   1. Necrotic uterine fibroid. Patient is on antibiotic coverage with zosyn and plans are for hysterectomy tomorrow.  Patient NPO. 2. Dehydration. Improving with IV fluids 3. Acute renal failure secondary to dehydration. Improved with IV fluids 4. Anemia due to chronic blood loss from uterine fibroids. Patient has been transfused a total of 4 units of packed red blood cells during this admission . Will give 2 units FFP today 5. Fever likely due to #1. See management plans as above 6. Graves' disease. Patient has had poor followup in the outpatient setting for thyroid uptake scan. Endocrinology is following. Last TFT's are wnl. 7. Severe protein calorie malnutrition. Nutrition consult. After discussion with Critical Care, will hold Albumin until tomorrow,and reconsider it perioperatively.       8.    Will attempt to arrange PICC line preop, either today or tomorrow before surgery.    LOS: 3 days    Itzy Adler V 09/26/2012, 11:08 AM

## 2012-09-26 NOTE — Progress Notes (Signed)
[facility] PROGRESS NOTE  [name]:[id number] DOB: [birth year] DOA: 09/23/2012 PCP: Tilda Burrow, MD  Assessment/Plan: Principal Problem:  *Dehydration Active Problems:  Fibroid uterus  Graves disease  Chronic blood loss anemia  Chronic kidney disease  1. Necrotic uterine fibroid.  Patient is on antibiotic coverage with zosyn and plans are for hysterectomy tomorrow.   2. Dehydration. Improving with IV fluids 3. Acute renal failure secondary to dehydration. Improved with IV fluids 4. Anemia due to chronic blood loss from uterine fibroids. Patient has been transfused a total of 4 units of packed red blood cells during this admission 5. Fever likely due to #1. See management plans as above 6. Graves' disease. Patient has had poor followup in the outpatient setting for thyroid uptake scan. Endocrinology is following. 7. Severe protein calorie malnutrition. Nutrition consult. Will give albumin today to help optimize for surgery  Code Status: Full code Family Communication: discussed with patient Disposition Plan: discharge home when medically stable, transfer to telemetry today   Brief narrative: This lady was admitted to the hospital with significant anemia and was acutely dehydrated. She was started on IV fluids and was given a blood transfusion. During her hospital stay she became more confused and somewhat hypotensive requiring transfer to the step down unit. Currently she is being evaluated for her uterine fibroids an underlying bacterial vaginosis. She does have a history of noncompliance and has not undergone outpatient workup for her hyperthyroidism.  Consultants:  Gynecology, Dr. Emelda Fear  Endocrinology, Dr. Fransico Him  Procedures:  none  Antibiotics:  Zosyn  flagyl  HPI/Subjective: Denies any complaints today. Feels better. Anxious about surgery tomorrow.  Objective: Filed Vitals:   09/26/12 0600 09/26/12 0700 09/26/12 0738 09/26/12 0800  BP:  124/73 120/76  134/70  Pulse: 104 105  79  Temp:   99.2 F (37.3 C)   TempSrc:      Resp: 26 25  23   Height:      Weight: 97.4 kg (214 lb 11.7 oz)     SpO2: 96% 96%  98%    Intake/Output Summary (Last 24 hours) at 09/26/12 1005 Last data filed at 09/26/12 0845  Gross per 24 hour  Intake 2295.83 ml  Output   1401 ml  Net 894.83 ml   Filed Weights   09/23/12 1703 09/25/12 0416 09/26/12 0600  Weight: 92.9 kg (204 lb 12.9 oz) 97.1 kg (214 lb 1.1 oz) 97.4 kg (214 lb 11.7 oz)    Exam:   General:  NAD  Cardiovascular: s1, s2, rrr  Respiratory: cta b  Abdomen: soft, distended, bs+  Data Reviewed: Basic Metabolic Panel:  Lab 09/26/12 8119 09/25/12 1020 09/25/12 0508 09/24/12 0428 09/23/12 1104  NA 130* 136 136 136 131*  K 3.2* 3.4* 3.5 2.9* 3.5  CL 98 103 105 104 96  CO2 22 24 24 24 26   GLUCOSE 104* 101* 100* 108* 112*  BUN 15 20 21  33* 39*  CREATININE 1.09 1.21* 1.20* 1.48* 1.77*  CALCIUM 8.6 8.8 8.6 8.3* 9.6  MG -- -- -- -- --  PHOS -- -- -- -- --   Liver Function Tests:  Lab 09/25/12 0508 09/24/12 0428 09/23/12 1415  AST 17 20 33  ALT 17 19 26   ALKPHOS 129* 108 135*  BILITOT 1.0 1.1 1.6*  PROT 8.1 7.2 8.2  ALBUMIN 1.6* 1.5* 1.8*   No results found for this basename: LIPASE:5,AMYLASE:5 in the last 168 hours No results found for this basename: AMMONIA:5 in the last 168 hours  CBC:  Lab 09/26/12 0513 09/25/12 0508 09/24/12 0943 09/24/12 0428 09/23/12 1104  WBC 11.4* 11.6* 8.7 7.9 10.4  NEUTROABS -- -- -- -- --  HGB 10.6* 11.0* 9.5* 7.8* 8.3*  HCT 33.3* 33.1* 28.9* 24.7* 25.7*  MCV 77.4* 77.3* 77.1* 77.7* 76.0*  PLT 314 316 307 299 403*   Cardiac Enzymes: No results found for this basename: CKTOTAL:5,CKMB:5,CKMBINDEX:5,TROPONINI:5 in the last 168 hours BNP (last 3 results)  Basename 08/28/12 2145  PROBNP 617.9*   CBG:  Lab 09/23/12 1709  GLUCAP 90    Recent Results (from the past 240 hour(s))  URINE CULTURE     Status: Normal   Collection  Time   09/23/12  2:25 PM      Component Value Range Status Comment   Specimen Description URINE, CLEAN CATCH   Final    Special Requests NONE   Final    Culture  Setup Time 09/24/2012 01:05   Final    Colony Count NO GROWTH   Final    Culture NO GROWTH   Final    Report Status 09/24/2012 FINAL   Final   CULTURE, BLOOD (ROUTINE X 2)     Status: Normal (Preliminary result)   Collection Time   09/23/12  8:39 PM      Component Value Range Status Comment   Specimen Description BLOOD LEFT HAND   Final    Special Requests BOTTLES DRAWN AEROBIC AND ANAEROBIC 6CC   Final    Culture NO GROWTH 3 DAYS   Final    Report Status PENDING   Incomplete   CULTURE, BLOOD (ROUTINE X 2)     Status: Normal (Preliminary result)   Collection Time   09/23/12  8:41 PM      Component Value Range Status Comment   Specimen Description BLOOD LEFT ANTECUBITAL   Final    Special Requests BOTTLES DRAWN AEROBIC AND ANAEROBIC 6CC   Final    Culture NO GROWTH 3 DAYS   Final    Report Status PENDING   Incomplete   MRSA PCR SCREENING     Status: Normal   Collection Time   09/24/12  2:44 AM      Component Value Range Status Comment   MRSA by PCR NEGATIVE  NEGATIVE Final   WET PREP, GENITAL     Status: Abnormal   Collection Time   09/24/12  2:35 PM      Component Value Range Status Comment   Yeast Wet Prep HPF POC NONE SEEN  NONE SEEN Final    Trich, Wet Prep NONE SEEN  NONE SEEN Final    Clue Cells Wet Prep HPF POC RARE (*) NONE SEEN Final    WBC, Wet Prep HPF POC TOO NUMEROUS TO COUNT (*) NONE SEEN Final      Studies: Ct Abdomen Pelvis W Contrast  09/25/2012  *RADIOLOGY REPORT*  Clinical Data: Fibroid uterus with profuse vaginal drainage. Suspect infarcted fibroid with central necrosis.  CT ABDOMEN AND PELVIS WITH CONTRAST  Technique:  Multidetector CT imaging of the abdomen and pelvis was performed following the standard protocol during bolus administration of intravenous contrast.  Contrast: OMNIPAQUE IOHEXOL  300 MG/ML  SOLN  Comparison: Abdominal ultrasound of 08/29/2012.  Abdominal pelvic CT of 06/11/2012.  Findings: Bibasilar dependent atelectasis. Normal heart size without pericardial or pleural effusion.  Too small to characterize hepatic dome lesion.  Normal spleen, stomach, pancreas, gallbladder, biliary tract, adrenal glands.  Too small to characterize upper pole right renal lesion  which is likely a cyst.  Normal left kidney. No hydronephrosis.  Prominent retroperitoneal nodes are again identified.  Largest measures 1.1 cm on image 39.  This is similar to on the prior.  Abdominal pelvic bowel loops are displaced by the mass to be detailed below.  No bowel obstruction is identified.  There is small volume pelvic and trace abdominal ascites which is new.   1.4 cm soft tissue density on image 78 within the left hemi pelvis is favored to represent ovarian tissue, when compared the prior exam. A second site of adenopathy is felt less likely.  There are prominent nodes along the left common iliac vasculature.  The presumed uterine based mass is again identified extending into the upper abdomen.  This measures 23.7 x 15.9 cm on image 53 at approximately the same level as on the prior exam.  On that exam, it measured 25.4 x 17.7 cm.  There are new areas of central gas, most consistent with necrosis.  Extensive heterogeneous density throughout.  No well-defined fluid collection.  Foley catheter within urinary bladder.  Small volume cul-de-sac fluid.  Mild anasarca which is new.  IMPRESSION:  1. Development of multifocal central gas within the previously described large abdominal pelvic mass.  Most consistent with necrosis of a dominant fibroid.  Superinfection cannot be excluded. Differential considerations for the presumed uterine based mass include leiomyosarcoma. 2.  Development of small volume pelvic fluid and trace abdominal ascites. 3.  Mild abdominal and possibly pelvic adenopathy.  Favored to be reactive.   Consider CT follow-up.   Original Report Authenticated By: Consuello Bossier, M.D.    Dg Chest Port 1 View  09/25/2012  *RADIOLOGY REPORT*  Clinical Data: Fever.  PORTABLE CHEST - 1 VIEW  Comparison: Chest x-ray 08/28/2012.  Findings: Lung volumes are slightly low. Linear opacity at the left base is most compatible with subsegmental atelectasis.  No consolidative airspace disease.  No pleural effusions.  No pneumothorax.  No pulmonary nodule or mass noted.  Pulmonary vasculature and the cardiomediastinal silhouette are within normal limits.  IMPRESSION: 1. Low lung volumes with subsegmental atelectasis in the left lower lobe.  No radiographic evidence of acute cardiopulmonary disease.   Original Report Authenticated By: Florencia Reasons, M.D.     Scheduled Meds:    . antiseptic oral rinse  15 mL Mouth Rinse BID  . feeding supplement  1 Container Oral TID BM  . multivitamin  5 mL Oral Daily  . piperacillin-tazobactam (ZOSYN)  IV  3.375 g Intravenous Q8H  . potassium chloride  10 mEq Intravenous Q1 Hr x 4  . potassium chloride      . sodium chloride      . DISCONTD: cefTRIAXone (ROCEPHIN)  IV  1 g Intravenous Q24H  . DISCONTD: metronidazole  500 mg Intravenous Q12H   Continuous Infusions:    . sodium chloride 50 mL/hr at 09/26/12 0631  . DISCONTD: sodium chloride Stopped (09/25/12 2212)    Principal Problem:  *Dehydration Active Problems:  Fibroid uterus  Graves disease  Chronic blood loss anemia  Chronic kidney disease    Time spent: 25 mins    Ursula Dermody  [facility] Pager [phone]. If 7PM-7AM, please contact night-coverage at [email or web address], password [facility] 09/26/2012, 10:05 AM  LOS: 3 days

## 2012-09-26 NOTE — Progress Notes (Signed)
2nd unit FFP completed without complication.

## 2012-09-26 NOTE — Progress Notes (Signed)
First unit FFP infusion completed with no complications.

## 2012-09-26 NOTE — Plan of Care (Signed)
Problem: Phase II Progression Outcomes Goal: Progress activity as tolerated unless otherwise ordered Outcome: Progressing oob to bsc with 2 person assist.  Weakness continues.   Goal: Other Phase II Outcomes/Goals Outcome: Progressing Plan for surgery on Monday at 1000.

## 2012-09-27 ENCOUNTER — Encounter (HOSPITAL_COMMUNITY): Payer: Self-pay | Admitting: Anesthesiology

## 2012-09-27 ENCOUNTER — Encounter (HOSPITAL_COMMUNITY): Payer: Self-pay | Admitting: *Deleted

## 2012-09-27 ENCOUNTER — Ambulatory Visit (HOSPITAL_COMMUNITY): Payer: 59

## 2012-09-27 ENCOUNTER — Inpatient Hospital Stay (HOSPITAL_COMMUNITY): Payer: 59 | Admitting: Anesthesiology

## 2012-09-27 ENCOUNTER — Inpatient Hospital Stay (HOSPITAL_COMMUNITY): Payer: 59

## 2012-09-27 ENCOUNTER — Encounter (HOSPITAL_COMMUNITY)
Admission: EM | Disposition: A | Discharge status: Home Health | Payer: Self-pay | Source: Home / Self Care | Attending: Internal Medicine

## 2012-09-27 DIAGNOSIS — I82409 Acute embolism and thrombosis of unspecified deep veins of unspecified lower extremity: Secondary | ICD-10-CM | POA: Diagnosis present

## 2012-09-27 DIAGNOSIS — R030 Elevated blood-pressure reading, without diagnosis of hypertension: Secondary | ICD-10-CM

## 2012-09-27 HISTORY — PX: SUPRACERVICAL ABDOMINAL HYSTERECTOMY: SHX5393

## 2012-09-27 LAB — COMPREHENSIVE METABOLIC PANEL
ALT: 9 U/L (ref 0–35)
AST: 9 U/L (ref 0–37)
Albumin: 1.8 g/dL — ABNORMAL LOW (ref 3.5–5.2)
Alkaline Phosphatase: 95 U/L (ref 39–117)
Chloride: 102 mEq/L (ref 96–112)
Potassium: 3.4 mEq/L — ABNORMAL LOW (ref 3.5–5.1)
Sodium: 134 mEq/L — ABNORMAL LOW (ref 135–145)
Total Bilirubin: 0.9 mg/dL (ref 0.3–1.2)

## 2012-09-27 LAB — PREPARE FRESH FROZEN PLASMA

## 2012-09-27 LAB — CBC
HCT: 31.2 % — ABNORMAL LOW (ref 36.0–46.0)
MCH: 25.3 pg — ABNORMAL LOW (ref 26.0–34.0)
MCV: 79 fL (ref 78.0–100.0)
Platelets: 274 10*3/uL (ref 150–400)
RDW: 20.4 % — ABNORMAL HIGH (ref 11.5–15.5)

## 2012-09-27 SURGERY — HYSTERECTOMY, SUPRACERVICAL, ABDOMINAL
Anesthesia: General | Site: Abdomen | Wound class: Clean Contaminated

## 2012-09-27 MED ORDER — ONDANSETRON HCL 4 MG/2ML IJ SOLN
INTRAMUSCULAR | Status: AC
  Filled 2012-09-27: qty 2

## 2012-09-27 MED ORDER — DIPHENHYDRAMINE HCL 12.5 MG/5ML PO ELIX
12.5000 mg | ORAL_SOLUTION | Freq: Four times a day (QID) | ORAL | Status: DC | PRN
  Filled 2012-09-27: qty 5

## 2012-09-27 MED ORDER — METOPROLOL TARTRATE 1 MG/ML IV SOLN
5.0000 mg | Freq: Once | INTRAVENOUS | Status: AC
  Administered 2012-09-27: 5 mg via INTRAVENOUS

## 2012-09-27 MED ORDER — MIDAZOLAM HCL 2 MG/2ML IJ SOLN
INTRAMUSCULAR | Status: AC
  Filled 2012-09-27: qty 2

## 2012-09-27 MED ORDER — SUFENTANIL CITRATE 50 MCG/ML IV SOLN
INTRAVENOUS | Status: AC
  Filled 2012-09-27: qty 1

## 2012-09-27 MED ORDER — SODIUM CHLORIDE 0.9 % IJ SOLN: 9.0000 mL | INTRAMUSCULAR | Status: DC | PRN

## 2012-09-27 MED ORDER — KCL-LACTATED RINGERS-D5W 20 MEQ/L IV SOLN
INTRAVENOUS | Status: DC
  Administered 2012-09-27 – 2012-09-30 (×3): via INTRAVENOUS
  Filled 2012-09-27 (×10): qty 1000

## 2012-09-27 MED ORDER — ONDANSETRON HCL 4 MG/2ML IJ SOLN: 4.0000 mg | Freq: Once | INTRAMUSCULAR | Status: DC | PRN

## 2012-09-27 MED ORDER — METOPROLOL TARTRATE 1 MG/ML IV SOLN
INTRAVENOUS | Status: DC | PRN
  Administered 2012-09-27: 2 mg via INTRAVENOUS
  Administered 2012-09-27: 1 mg via INTRAVENOUS

## 2012-09-27 MED ORDER — GLYCOPYRROLATE 0.2 MG/ML IJ SOLN
INTRAMUSCULAR | Status: AC
  Filled 2012-09-27: qty 3

## 2012-09-27 MED ORDER — NEOSTIGMINE METHYLSULFATE 1 MG/ML IJ SOLN
INTRAMUSCULAR | Status: AC
  Filled 2012-09-27: qty 10

## 2012-09-27 MED ORDER — LACTATED RINGERS IV SOLN
INTRAVENOUS | Status: DC
  Administered 2012-09-27: 10:00:00 via INTRAVENOUS

## 2012-09-27 MED ORDER — NALOXONE HCL 0.4 MG/ML IJ SOLN: 0.4000 mg | INTRAMUSCULAR | Status: DC | PRN

## 2012-09-27 MED ORDER — FENTANYL CITRATE 0.05 MG/ML IJ SOLN: 25.0000 ug | INTRAMUSCULAR | Status: DC | PRN

## 2012-09-27 MED ORDER — SUFENTANIL CITRATE 50 MCG/ML IV SOLN
INTRAVENOUS | Status: DC | PRN
  Administered 2012-09-27 (×2): 10 ug via INTRAVENOUS
  Administered 2012-09-27: 20 ug via INTRAVENOUS
  Administered 2012-09-27: 30 ug via INTRAVENOUS
  Administered 2012-09-27 (×3): 10 ug via INTRAVENOUS

## 2012-09-27 MED ORDER — ONDANSETRON HCL 4 MG PO TABS: 4.0000 mg | ORAL_TABLET | Freq: Four times a day (QID) | ORAL | Status: DC | PRN

## 2012-09-27 MED ORDER — METOPROLOL TARTRATE 1 MG/ML IV SOLN
INTRAVENOUS | Status: AC
  Filled 2012-09-27: qty 5

## 2012-09-27 MED ORDER — KETOROLAC TROMETHAMINE 30 MG/ML IJ SOLN: 30.0000 mg | Freq: Four times a day (QID) | INTRAMUSCULAR | Status: AC

## 2012-09-27 MED ORDER — LACTATED RINGERS IV SOLN
INTRAVENOUS | Status: DC | PRN
  Administered 2012-09-27 (×2): via INTRAVENOUS

## 2012-09-27 MED ORDER — PANTOPRAZOLE SODIUM 40 MG IV SOLR
40.0000 mg | Freq: Every day | INTRAVENOUS | Status: DC
  Administered 2012-09-27 – 2012-09-28 (×2): 40 mg via INTRAVENOUS
  Filled 2012-09-27 (×2): qty 40

## 2012-09-27 MED ORDER — FUROSEMIDE 10 MG/ML IJ SOLN
20.0000 mg | Freq: Once | INTRAMUSCULAR | Status: AC
  Administered 2012-09-27: 20 mg via INTRAVENOUS
  Filled 2012-09-27: qty 2

## 2012-09-27 MED ORDER — LIDOCAINE HCL (CARDIAC) 10 MG/ML IV SOLN
INTRAVENOUS | Status: DC | PRN
  Administered 2012-09-27: 50 mg via INTRAVENOUS

## 2012-09-27 MED ORDER — ROCURONIUM BROMIDE 100 MG/10ML IV SOLN
INTRAVENOUS | Status: DC | PRN
  Administered 2012-09-27 (×3): 10 mg via INTRAVENOUS
  Administered 2012-09-27: 40 mg via INTRAVENOUS

## 2012-09-27 MED ORDER — ONDANSETRON HCL 4 MG/2ML IJ SOLN: 4.0000 mg | Freq: Four times a day (QID) | INTRAMUSCULAR | Status: DC | PRN

## 2012-09-27 MED ORDER — PROPOFOL 10 MG/ML IV BOLUS
INTRAVENOUS | Status: DC | PRN
  Administered 2012-09-27: 150 mg via INTRAVENOUS

## 2012-09-27 MED ORDER — GLYCOPYRROLATE 0.2 MG/ML IJ SOLN
INTRAMUSCULAR | Status: DC | PRN
  Administered 2012-09-27: 0.6 mg via INTRAVENOUS

## 2012-09-27 MED ORDER — 0.9 % SODIUM CHLORIDE (POUR BTL) OPTIME
TOPICAL | Status: DC | PRN
  Administered 2012-09-27: 2000 mL
  Administered 2012-09-27: 1000 mL

## 2012-09-27 MED ORDER — PROPOFOL 10 MG/ML IV EMUL
INTRAVENOUS | Status: AC
  Filled 2012-09-27: qty 20

## 2012-09-27 MED ORDER — KETOROLAC TROMETHAMINE 30 MG/ML IJ SOLN
30.0000 mg | Freq: Once | INTRAMUSCULAR | Status: AC
  Administered 2012-09-27: 30 mg via INTRAVENOUS
  Filled 2012-09-27: qty 1

## 2012-09-27 MED ORDER — HYDROMORPHONE 0.3 MG/ML IV SOLN
INTRAVENOUS | Status: DC
  Administered 2012-09-27: 14:00:00 via INTRAVENOUS

## 2012-09-27 MED ORDER — ROCURONIUM BROMIDE 50 MG/5ML IV SOLN
INTRAVENOUS | Status: AC
  Filled 2012-09-27: qty 1

## 2012-09-27 MED ORDER — LIDOCAINE HCL (PF) 1 % IJ SOLN
INTRAMUSCULAR | Status: AC
  Filled 2012-09-27: qty 5

## 2012-09-27 MED ORDER — DOCUSATE SODIUM 100 MG PO CAPS
100.0000 mg | ORAL_CAPSULE | Freq: Two times a day (BID) | ORAL | Status: DC
  Administered 2012-09-28 – 2012-10-07 (×12): 100 mg via ORAL
  Filled 2012-09-27 (×16): qty 1

## 2012-09-27 MED ORDER — ONDANSETRON HCL 4 MG/2ML IJ SOLN
4.0000 mg | Freq: Four times a day (QID) | INTRAMUSCULAR | Status: DC | PRN
  Administered 2012-09-28: 4 mg via INTRAVENOUS
  Filled 2012-09-27: qty 2

## 2012-09-27 MED ORDER — DIPHENHYDRAMINE HCL 50 MG/ML IJ SOLN: 12.5000 mg | Freq: Four times a day (QID) | INTRAMUSCULAR | Status: DC | PRN

## 2012-09-27 MED ORDER — NEOSTIGMINE METHYLSULFATE 1 MG/ML IJ SOLN
INTRAMUSCULAR | Status: DC | PRN
  Administered 2012-09-27: 4 mg via INTRAVENOUS

## 2012-09-27 MED ORDER — ONDANSETRON HCL 4 MG/2ML IJ SOLN
4.0000 mg | Freq: Once | INTRAMUSCULAR | Status: AC
  Administered 2012-09-27: 4 mg via INTRAVENOUS

## 2012-09-27 MED ORDER — MIDAZOLAM HCL 2 MG/2ML IJ SOLN
1.0000 mg | INTRAMUSCULAR | Status: DC | PRN
  Administered 2012-09-27: 2 mg via INTRAVENOUS

## 2012-09-27 MED ORDER — SODIUM CHLORIDE 0.9 % IV SOLN
INTRAVENOUS | Status: DC | PRN
  Administered 2012-09-27: 11:00:00 via INTRAVENOUS

## 2012-09-27 MED ORDER — KETOROLAC TROMETHAMINE 30 MG/ML IJ SOLN
30.0000 mg | Freq: Four times a day (QID) | INTRAMUSCULAR | Status: AC
  Administered 2012-09-27 – 2012-09-28 (×4): 30 mg via INTRAVENOUS
  Filled 2012-09-27 (×4): qty 1

## 2012-09-27 MED ORDER — ZOLPIDEM TARTRATE 5 MG PO TABS
5.0000 mg | ORAL_TABLET | Freq: Every evening | ORAL | Status: DC | PRN
  Administered 2012-09-29 – 2012-10-05 (×4): 5 mg via ORAL
  Filled 2012-09-27 (×4): qty 1

## 2012-09-27 MED ORDER — OXYCODONE-ACETAMINOPHEN 5-325 MG PO TABS
1.0000 | ORAL_TABLET | ORAL | Status: DC | PRN
  Administered 2012-09-28: 2 via ORAL
  Administered 2012-09-28: 1 via ORAL
  Administered 2012-09-29 – 2012-10-01 (×8): 2 via ORAL
  Administered 2012-10-02: 1 via ORAL
  Administered 2012-10-02: 2 via ORAL
  Administered 2012-10-02: 1 via ORAL
  Administered 2012-10-02: 2 via ORAL
  Administered 2012-10-03 – 2012-10-04 (×2): 1 via ORAL
  Administered 2012-10-04 (×2): 2 via ORAL
  Administered 2012-10-05: 1 via ORAL
  Administered 2012-10-05 (×2): 2 via ORAL
  Administered 2012-10-06: 1 via ORAL
  Administered 2012-10-06: 2 via ORAL
  Administered 2012-10-07 (×2): 1 via ORAL
  Filled 2012-09-27 (×2): qty 2
  Filled 2012-09-27: qty 1
  Filled 2012-09-27: qty 2
  Filled 2012-09-27: qty 1
  Filled 2012-09-27: qty 10
  Filled 2012-09-27: qty 1
  Filled 2012-09-27 (×4): qty 2
  Filled 2012-09-27 (×3): qty 1
  Filled 2012-09-27 (×4): qty 2
  Filled 2012-09-27: qty 1
  Filled 2012-09-27 (×3): qty 2
  Filled 2012-09-27: qty 1
  Filled 2012-09-27 (×3): qty 2

## 2012-09-27 MED ORDER — HYDROMORPHONE 0.3 MG/ML IV SOLN
INTRAVENOUS | Status: DC
  Administered 2012-09-27: 14:00:00 via INTRAVENOUS
  Filled 2012-09-27: qty 25

## 2012-09-27 SURGICAL SUPPLY — 60 items
APPLIER CLIP 11 MED OPEN (CLIP)
APPLIER CLIP 13 LRG OPEN (CLIP)
BAG HAMPER (MISCELLANEOUS) ×4 IMPLANT
BENZOIN TINCTURE PRP APPL 2/3 (GAUZE/BANDAGES/DRESSINGS) ×4 IMPLANT
CELLS DAT CNTRL 66122 CELL SVR (MISCELLANEOUS) IMPLANT
CLIP APPLIE 11 MED OPEN (CLIP) IMPLANT
CLIP APPLIE 13 LRG OPEN (CLIP) IMPLANT
CLOTH BEACON ORANGE TIMEOUT ST (SAFETY) ×4 IMPLANT
COVER LIGHT HANDLE STERIS (MISCELLANEOUS) ×8 IMPLANT
DRAPE WARM FLUID 44X44 (DRAPE) ×4 IMPLANT
DRESSING TELFA 8X3 (GAUZE/BANDAGES/DRESSINGS) ×4 IMPLANT
ELECT REM PT RETURN 9FT ADLT (ELECTROSURGICAL) ×4
ELECTRODE REM PT RTRN 9FT ADLT (ELECTROSURGICAL) ×3 IMPLANT
EVACUATOR DRAINAGE 10X20 100CC (DRAIN) ×3 IMPLANT
EVACUATOR SILICONE 100CC (DRAIN) ×1
FORMALIN 10 PREFIL 480ML (MISCELLANEOUS) IMPLANT
GLOVE BIOGEL PI IND STRL 9 (GLOVE) ×3 IMPLANT
GLOVE BIOGEL PI INDICATOR 9 (GLOVE) ×1
GLOVE ECLIPSE 9.0 STRL (GLOVE) ×4 IMPLANT
GLOVE INDICATOR STER SZ 9 (GLOVE) ×4 IMPLANT
GOWN STRL REIN 3XL LVL4 (GOWN DISPOSABLE) ×4 IMPLANT
GOWN STRL REIN XL XLG (GOWN DISPOSABLE) ×8 IMPLANT
INST SET MAJOR GENERAL (KITS) ×4 IMPLANT
KIT ROOM TURNOVER APOR (KITS) ×4 IMPLANT
MANIFOLD NEPTUNE II (INSTRUMENTS) ×4 IMPLANT
NS IRRIG 1000ML POUR BTL (IV SOLUTION) ×12 IMPLANT
PACK ABDOMINAL MAJOR (CUSTOM PROCEDURE TRAY) ×4 IMPLANT
PAD ARMBOARD 7.5X6 YLW CONV (MISCELLANEOUS) ×4 IMPLANT
RETRACTOR WND ALEXIS 25 LRG (MISCELLANEOUS) IMPLANT
RTRCTR WOUND ALEXIS 18CM MED (MISCELLANEOUS)
RTRCTR WOUND ALEXIS 25CM LRG (MISCELLANEOUS)
SEPRAFILM MEMBRANE 5X6 (MISCELLANEOUS) IMPLANT
SET BASIN LINEN APH (SET/KITS/TRAYS/PACK) ×4 IMPLANT
SOL PREP PROV IODINE SCRUB 4OZ (MISCELLANEOUS) ×4 IMPLANT
SPONGE DRAIN TRACH 4X4 STRL 2S (GAUZE/BANDAGES/DRESSINGS) ×4 IMPLANT
SPONGE GAUZE 4X4 12PLY (GAUZE/BANDAGES/DRESSINGS) ×4 IMPLANT
SPONGE LAP 18X18 X RAY DECT (DISPOSABLE) ×4 IMPLANT
STAPLER VISISTAT 35W (STAPLE) ×4 IMPLANT
STRIP CLOSURE SKIN 1/2X4 (GAUZE/BANDAGES/DRESSINGS) ×4 IMPLANT
SUT CHROMIC 0 CT 1 (SUTURE) ×32 IMPLANT
SUT CHROMIC 2 0 CT 1 (SUTURE) ×8 IMPLANT
SUT CHROMIC GUT BROWN 0 54 (SUTURE) IMPLANT
SUT CHROMIC GUT BROWN 0 54IN (SUTURE)
SUT ETHILON 3 0 FSL (SUTURE) ×4 IMPLANT
SUT MON AB 3-0 SH 27 (SUTURE) IMPLANT
SUT PDS AB CT VIOLET #0 27IN (SUTURE) ×8 IMPLANT
SUT PLAIN CT 1/2CIR 2-0 27IN (SUTURE) ×8 IMPLANT
SUT PROLENE 0 CT 1 30 (SUTURE) IMPLANT
SUT VIC AB 0 CT1 27 (SUTURE) ×2
SUT VIC AB 0 CT1 27XBRD ANTBC (SUTURE) IMPLANT
SUT VIC AB 0 CT1 27XCR 8 STRN (SUTURE) ×6 IMPLANT
SUT VIC AB 0 CTX 36 (SUTURE)
SUT VIC AB 0 CTX36XBRD ANTBCTR (SUTURE) IMPLANT
SUT VIC AB 2-0 CT1 27 (SUTURE) ×2
SUT VIC AB 2-0 CT1 TAPERPNT 27 (SUTURE) ×6 IMPLANT
SUT VICRYL 3 0 (SUTURE) IMPLANT
SUT VICRYL 4 0 KS 27 (SUTURE) ×4 IMPLANT
TAPE CLOTH SURG 4X10 WHT LF (GAUZE/BANDAGES/DRESSINGS) ×4 IMPLANT
TOWEL BLUE STERILE X RAY DET (MISCELLANEOUS) ×4 IMPLANT
TRAY FOLEY CATH 14FR (SET/KITS/TRAYS/PACK) IMPLANT

## 2012-09-27 NOTE — Progress Notes (Signed)
UR Chart Review Completed  

## 2012-09-27 NOTE — Anesthesia Preprocedure Evaluation (Signed)
Anesthesia Evaluation  Patient identified by MRN, date of birth, ID band Patient awake    Reviewed: Allergy & Precautions, H&P , NPO status , Patient's Chart, lab work & pertinent test results, reviewed documented beta blocker date and time   Airway Mallampati: II TM Distance: >3 FB     Dental  (+) Teeth Intact and Poor Dentition   Pulmonary neg pulmonary ROS,    Pulmonary exam normal       Cardiovascular hypertension, Pt. on medications Rhythm:Regular Rate:Normal     Neuro/Psych    GI/Hepatic   Endo/Other  Hyperthyroidism (beta blockers)   Renal/GU Renal disease     Musculoskeletal   Abdominal   Peds  Hematology   Anesthesia Other Findings   Reproductive/Obstetrics                           Anesthesia Physical Anesthesia Plan  ASA: II  Anesthesia Plan: General   Post-op Pain Management:    Induction: Intravenous  Airway Management Planned: Oral ETT  Additional Equipment:   Intra-op Plan:   Post-operative Plan: Extubation in OR  Informed Consent: I have reviewed the patients History and Physical, chart, labs and discussed the procedure including the risks, benefits and alternatives for the proposed anesthesia with the patient or authorized representative who has indicated his/her understanding and acceptance.     Plan Discussed with:   Anesthesia Plan Comments: (preop beta blocker)        Anesthesia Quick Evaluation

## 2012-09-27 NOTE — Progress Notes (Signed)
Subjective: Patient reports tolerating PO and + flatus. Feels progressively better. Received 2 units FFP yesterday, Central PICC line placed, Right Brachial vein, good placement via CXR,    Objective: I have reviewed patient's vital signs and labs. BP 123/76  Pulse 93  Temp 98.6 F (37 C) (Oral)  Resp 24  Ht 5\' 9"  (1.753 m)  Wt 96 kg (211 lb 10.3 oz)  BMI 31.25 kg/m2  SpO2 100%  LMP 09/18/2012  CBC    Component Value Date/Time   WBC 7.9 09/27/2012 0445   RBC 3.95 09/27/2012 0445   HGB 10.0* 09/27/2012 0445   HCT 31.2* 09/27/2012 0445   PLT 274 09/27/2012 0445   MCV 79.0 09/27/2012 0445   MCH 25.3* 09/27/2012 0445   MCHC 32.1 09/27/2012 0445   RDW 20.4* 09/27/2012 0445   LYMPHSABS 1.1 08/31/2012 0507   MONOABS 0.6 08/31/2012 0507   EOSABS 0.1 08/31/2012 0507   BASOSABS 0.0 08/31/2012 0507   Potassium 3.4   Creatinine has IMPROVED at 1.03 General: alert, cooperative and no distress GI: normal findings: abdomen soft, nontender, firm mass stable.  Extremities: edema improved in Rt upper arm, still 3+ left arm,Pt rests on left side, and legs are improved, 2+,. good color Vaginal Bleeding: none   Assessment/Plan: Assessment/Plan:  Principal Problem:  Dehydration , resolved Active Problems:  Fibroid uterus with central necrosis Graves disease , stable with no TFT at present Chronic blood loss anemia  Poor nutritional status s/p chronic disease    LOS: 4 days  To OR today at 10:15.  4 UNITS PRBC AVAILABLE.    [name] V 09/27/2012, 8:11 AM

## 2012-09-27 NOTE — Brief Op Note (Signed)
09/23/2012 - 09/27/2012  12:35 PM  PATIENT:  [name]  41 y.o. female  PRE-OPERATIVE DIAGNOSIS:  Uterine fibroid with central necrosis  POST-OPERATIVE DIAGNOSIS:  Necrotic fibroid  PROCEDURE:  Procedure(s) (LRB) with comments: HYSTERECTOMY SUPRACERVICAL ABDOMINAL (N/A)  SURGEON:  Surgeon(s) and Role:    * Tilda Burrow, MD - Primary    * Lazaro Arms, MD  PHYSICIAN ASSISTANT: Turner Daniels, MD ASSISTANTS: none  ANESTHESIA:   general  EBL:  Total I/O In: 1900 [I.V.:1550; Blood:350] Out: 500 [Urine:200; Blood:300]  BLOOD ADMINISTERED:350 CC PRBC  DRAINS: (1) Jackson-Pratt drain(s) with closed bulb suction in the subcutaneous space   LOCAL MEDICATIONS USED:  NONE  SPECIMEN:  Source of Specimen:  Uterus without cervix DISPOSITION OF SPECIMEN:  PATHOLOGY  COUNTS:  YES DICTATION: Reubin Milan Dictation Patient was taken operating room prepped and draped along surgery. Timeout was conducted and procedure confirmed by surgical team. Antibiotics were on board from prior therapy, meeting surgical preop criteria. Midline vertical incision was made from symphysis pubis to midway from umbilicus to the xiphoid, with knife dissection through the skin and then Bovie cautery down to the fascia. Fascia was sharply opened, and peritoneum elevated and opened carefully with Metzenbaum scissors and peritoneal incision opened the length of the skin incision. The uterus extended well above the upper aspects the incision but was able to completely flipped through the incision. Balfour retractor was in place. Anatomy could be inspected and ovaries appeared grossly normal.. Preservation of ovaries was considered appropriate. Round ligaments were taken down on each side, and utero-ovarian ligaments isolated doubly clamped cut and ligated with 0 chromic suture. Bladder flap was opened anteriorly and reduced inferiorly, and uterine vessels skeletonized bilaterally. On the right side there were very compact  vessel bundle  and on the left side the vessels were very splayed. On the left side it required 3 separate clamping in order to clamp cut and suture ligated across the uterine vessels using 0 Vicryl suture ligature. We stayed close to the uterus throughout this portion of procedure . Hemostasis was good and the final suture on the lower cardinal ligaments was tagged, 0 Vicryl. for this final pedicle. On the patient's right side the uterine vessels were more compactly  located and a single crossclamping with 2 curved Heaney clamps and Kelly clamps for backbleeding was performed then the pedicle taken down and doubly ligated with 0 Vicryl. The anterior cervicovaginal fornix was  identified short below the fibroid uterus, and a stab incision was attempted to be made into the anterior cervicovaginal fornix but bleeding was profuse so crossclamping across at this level with straight Heaney clamps was utilized to control bleeding. The specimen was amputated off, and closed with Aldridge stitch at the lateral vaginal cuff angle, and figure-of-eight sutures across the remainder of the cuff. It appeared that there was a small bit of nabothian cyst on the posterior edge of the cuff; small bit of the posterior lip of the cervix may have been left in situ.   (Pap smear was normal preop).  Once the cuff was closed pedicles were inspected. Small bit of point cautery was necessary on the left round ligament otherwise hemostasis was excellent pelvis was irrigated. Pedicles were inspected again confirmed as hemostatic  and laparotomy equipment removed. Anterior peritoneum was closed with running 2-0 Vicryl, the fascia closed with running continuous 0 Prolene sewn from end to the middle. Subcutaneous tissues were irrigated copiously, reapproximated with subcutaneous 2-0 plain interrupted sutures loosely applied,  then subcuticular 4-0 Vicryl on a Keith needle used for skin edge closure a flat JP drain was placed in the inferior  portion of the incision and allowed to exit through a separate stab incision. Patient tolerated procedure well she received one unit packed cells intraoperatively, with EBL 300 cc. Sponge and needle counts were correct patient to recovery room in stable condition she will go to step down unit overnight.  PLAN OF CARE: Admit to inpatient  PATIENT DISPOSITION:  PACU - hemodynamically stable.   Delay start of Pharmacological VTE agent (>24hrs) due to surgical blood loss or risk of bleeding: not applicable

## 2012-09-27 NOTE — Progress Notes (Signed)
Received pt from PACU. Pt alert and oriented. IV patent. Midline incision covered w/ clean ,dry drsg. Rt side JP is charged for suction. VSS> SCD"S in place. O2 sat 98% on room air. Afebrile.

## 2012-09-27 NOTE — Anesthesia Postprocedure Evaluation (Signed)
  Anesthesia Post-op Note  Patient: [name]  Procedure(s) Performed: Procedure(s) (LRB) with comments: HYSTERECTOMY SUPRACERVICAL ABDOMINAL (N/A)  Patient Location: PACU  Anesthesia Type: General  Level of Consciousness: awake, alert , oriented and patient cooperative  Airway and Oxygen Therapy: Patient Spontanous Breathing and Patient connected to face mask oxygen  Post-op Pain: mild  Post-op Assessment: Post-op Vital signs reviewed, Patient's Cardiovascular Status Stable, Respiratory Function Stable, Patent Airway and No signs of Nausea or vomiting  Post-op Vital Signs: Reviewed and stable  Complications: No apparent anesthesia complications  

## 2012-09-27 NOTE — Care Management Note (Signed)
    Page 1 of 2   10/07/2012     11:06:35 AM   CARE MANAGEMENT NOTE 10/07/2012  Patient:  [name], [name]   Account Number:  [id number]  Date Initiated:  09/27/2012  Documentation initiated by:  Rosemary Holms  Subjective/Objective Assessment:   Pt admitted with fibroid uterus, anemic, dehydration, and severe malnutrition. noncompliance with hyperthyroidism. Lives at home with spouse. To surgery today.     Action/Plan:   DC with HH RN and PCP appt.   Anticipated DC Date:  10/07/2012   Anticipated DC Plan:  HOME W HOME HEALTH SERVICES      DC Planning Services  CM consult      Choice offered to / List presented to:          [facility] arranged  HH-10 DISEASE MANAGEMENT  HH-1 RN  HH-2 PT  HH-4 NURSE'S AIDE      HH agency  [facility]   Status of service:  Completed, signed off Medicare Important Message given?   (If response is "NO", the following Medicare IM given date fields will be blank) Date Medicare IM given:   Date Additional Medicare IM given:    Discharge Disposition:  HOME W HOME HEALTH SERVICES  Per UR Regulation:    If discussed at Long Length of Stay Meetings, dates discussed:   09/28/2012  09/30/2012  10/05/2012  10/07/2012    Comments:  10/07/12 900 Sherma Vanmetre RN BSN CM Pt ready to DC. Decided to decline BSC. If needed once home she will ask for it then(due to cost and questionable need).[employer] DME co. [employer] called. Spoke to [place] to [employer] DME only [phone]) [facility] notified of DC for [facility] RN and PT. Pt unsure how to reach spouse to pick her up. RN notified.  10/06/09 1100 Dejana Pugsley RN BSN CM Referred pt to CIR. Denied due to ability to stand with assist per PT. Rosann Auerbach has approved [facility] for RN and PT. Although [employer] called to deny a aide and BSC late yesterday, they called to see where the [facility] should be delivered today???. Plans to DC Home tomorrow unless SNF is approved by [place].  10/05/12 1400 Braxon Suder Leanord Hawking RN BSN CM Recontacted [employer] to begin [facility] [place] /  DME process. intake # [id number]. CSW also contacting [employer] with information to place pt in a skilled facility.  10/01/12 1150 Kennady Zimmerle RN  BSN CM Transfered to unit. [facility] notified via Fax that pt not being DC. Spoke w/ rep for DME. She stated to call back with the same intake # to restart process.  09/30/12 Emalina Dubreuil Leanord Hawking RN BSN CM Called [facility]/[employer] and made referral for [facility] PT, RN, Aide. Also DME BSC. CM requested [facility] for [facility] and DME. [employer] offered and RN accepted Aides assistance with light housekeeping and meals. Intake # for [facility] [phone] Fax is [phone]  09/29/12 1000 Abbegayle Denault RN BSN CM Spoke with pt at bedside in ICU. Agrees to [facility] RN but is concerned about cost. She is agreeable to set it up and see how it goes when first DC'd. CM asked about a PCP which she does not have. Pt chose Dr. Jeanice Lim. Appt made for 10/22/12 @ 2PM. [facility] notified of [facility] RN pending. 09/27/12 Rosemary Holms RN BSN CM

## 2012-09-27 NOTE — Progress Notes (Addendum)
[facility] PROGRESS NOTE  [name] YNW:[id number] DOB: November 25, 1971 DOA: 09/23/2012 PCP: Tilda Burrow, MD  Assessment/Plan: Principal Problem:  *Dehydration Active Problems:  Fibroid uterus  Graves disease  Chronic blood loss anemia  Chronic kidney disease  1. Necrotic uterine fibroid.  Patient is on antibiotic coverage with zosyn.  She is s/p hysterectomy today.  GYN following.  Will continue antibiotics for now.  Drain in place. 2. Dehydration. resolved. 3. Acute renal failure secondary to dehydration. resolved 4. Anemia due to chronic blood loss from uterine fibroids. Patient has been transfused a total of 4 units of packed red blood cells during this admission 5. Fever likely due to #1. See management plans as above 6. Graves' disease. Patient has had poor followup in the outpatient setting for thyroid uptake scan. Endocrinology is following. 7. Severe protein calorie malnutrition. Nutrition consult. Will give albumin today to help optimize for surgery 8. Left upper extremity edema.  Check venous dopplers  Code Status: Full code Family Communication: discussed with patient Disposition Plan: discharge home when medically stable, transfer to telemetry today   Brief narrative: This lady was admitted to the hospital with significant anemia and was acutely dehydrated. She was started on IV fluids and was given a blood transfusion. During her hospital stay she became more confused and somewhat hypotensive requiring transfer to the step down unit. Currently she is being evaluated for her uterine fibroids an underlying bacterial vaginosis. She does have a history of noncompliance and has not undergone outpatient workup for her hyperthyroidism.  Consultants:  Gynecology, Dr. Emelda Fear  Endocrinology, Dr. Fransico Him  Procedures:  none  Antibiotics:  Zosyn  flagyl  HPI/Subjective: Patient is seen post surgery in room.  She has pain at the operative site.  Reports pain  is helped with pain medications. Currently on PCA.  Objective: Filed Vitals:   09/27/12 1400 09/27/12 1415 09/27/12 1427 09/27/12 1430  BP: 122/85   111/76  Pulse: 81 85    Temp:      TempSrc:      Resp: 18 16 19 20   Height:      Weight:      SpO2: 97% 98%      Intake/Output Summary (Last 24 hours) at 09/27/12 1656 Last data filed at 09/27/12 1431  Gross per 24 hour  Intake 3538.17 ml  Output   1680 ml  Net 1858.17 ml   Filed Weights   09/25/12 0416 09/26/12 0600 09/27/12 0500  Weight: 97.1 kg (214 lb 1.1 oz) 97.4 kg (214 lb 11.7 oz) 96 kg (211 lb 10.3 oz)    Exam:   General:  NAD  Cardiovascular: s1, s2, rrr  Respiratory: cta b  Abdomen: soft, distended, bs+  EXT: left UE 2+ edema, RUE 1+ edema  Data Reviewed: Basic Metabolic Panel:  Lab 09/27/12 6578 09/26/12 0513 09/25/12 1020 09/25/12 0508 09/24/12 0428  NA 134* 130* 136 136 136  K 3.4* 3.2* 3.4* 3.5 2.9*  CL 102 98 103 105 104  CO2 23 22 24 24 24   GLUCOSE 94 104* 101* 100* 108*  BUN 13 15 20 21  33*  CREATININE 1.03 1.09 1.21* 1.20* 1.48*  CALCIUM 8.5 8.6 8.8 8.6 8.3*  MG -- -- -- -- --  PHOS -- -- -- -- --   Liver Function Tests:  Lab 09/27/12 0445 09/25/12 0508 09/24/12 0428 09/23/12 1415  AST 9 17 20  33  ALT 9 17 19 26   ALKPHOS 95 129* 108 135*  BILITOT 0.9 1.0 1.1  1.6*  PROT 7.5 8.1 7.2 8.2  ALBUMIN 1.8* 1.6* 1.5* 1.8*   No results found for this basename: LIPASE:5,AMYLASE:5 in the last 168 hours No results found for this basename: AMMONIA:5 in the last 168 hours CBC:  Lab 09/27/12 0445 09/26/12 0513 09/25/12 0508 09/24/12 0943 09/24/12 0428  WBC 7.9 11.4* 11.6* 8.7 7.9  NEUTROABS -- -- -- -- --  HGB 10.0* 10.6* 11.0* 9.5* 7.8*  HCT 31.2* 33.3* 33.1* 28.9* 24.7*  MCV 79.0 77.4* 77.3* 77.1* 77.7*  PLT 274 314 316 307 299   Cardiac Enzymes: No results found for this basename: CKTOTAL:5,CKMB:5,CKMBINDEX:5,TROPONINI:5 in the last 168 hours BNP (last 3 results)  Basename 08/28/12  2145  PROBNP 617.9*   CBG:  Lab 09/23/12 1709  GLUCAP 90    Recent Results (from the past 240 hour(s))  URINE CULTURE     Status: Normal   Collection Time   09/23/12  2:25 PM      Component Value Range Status Comment   Specimen Description URINE, CLEAN CATCH   Final    Special Requests NONE   Final    Culture  Setup Time 09/24/2012 01:05   Final    Colony Count NO GROWTH   Final    Culture NO GROWTH   Final    Report Status 09/24/2012 FINAL   Final   CULTURE, BLOOD (ROUTINE X 2)     Status: Normal (Preliminary result)   Collection Time   09/23/12  8:39 PM      Component Value Range Status Comment   Specimen Description BLOOD LEFT HAND   Final    Special Requests BOTTLES DRAWN AEROBIC AND ANAEROBIC 6CC   Final    Culture NO GROWTH 4 DAYS   Final    Report Status PENDING   Incomplete   CULTURE, BLOOD (ROUTINE X 2)     Status: Normal (Preliminary result)   Collection Time   09/23/12  8:41 PM      Component Value Range Status Comment   Specimen Description BLOOD LEFT ANTECUBITAL   Final    Special Requests BOTTLES DRAWN AEROBIC AND ANAEROBIC 6CC   Final    Culture NO GROWTH 4 DAYS   Final    Report Status PENDING   Incomplete   CULTURE, ROUTINE-GENITAL     Status: Normal   Collection Time   09/23/12 11:50 PM      Component Value Range Status Comment   Specimen Description VAGINA   Final    Special Requests DISCHARGE   Final    Culture NORMAL VAGINAL FLORA   Final    Report Status 09/27/2012 FINAL   Final   MRSA PCR SCREENING     Status: Normal   Collection Time   09/24/12  2:44 AM      Component Value Range Status Comment   MRSA by PCR NEGATIVE  NEGATIVE Final   WET PREP, GENITAL     Status: Abnormal   Collection Time   09/24/12  2:35 PM      Component Value Range Status Comment   Yeast Wet Prep HPF POC NONE SEEN  NONE SEEN Final    Trich, Wet Prep NONE SEEN  NONE SEEN Final    Clue Cells Wet Prep HPF POC RARE (*) NONE SEEN Final    WBC, Wet Prep HPF POC TOO NUMEROUS TO  COUNT (*) NONE SEEN Final      Studies: Dg Chest Port 1 View  09/26/2012  *RADIOLOGY REPORT*  Clinical  Data: PICC placement.  PORTABLE CHEST - 1 VIEW  Comparison: Chest 09/25/2012.  Findings: New right PICC is in place with tip projecting in the superior cavoatrial junction.  The lung volumes are low.  Left basilar airspace disease is again seen.  No pneumothorax. Cardiomegaly.  IMPRESSION:  1.  Tip of right PICC projects over the superior cavoatrial junction. 2.  No change in left basilar airspace disease, likely atelectasis.   Original Report Authenticated By: Bernadene Bell. D'ALESSIO, M.D.     Scheduled Meds:    . antiseptic oral rinse  15 mL Mouth Rinse BID  . docusate sodium  100 mg Oral BID  . feeding supplement  1 Container Oral TID BM  . ketorolac  30 mg Intravenous Once  . ketorolac  30 mg Intravenous Q6H   Or  . ketorolac  30 mg Intramuscular Q6H  . metoprolol  5 mg Intravenous Once  . multivitamin  5 mL Oral Daily  . ondansetron (ZOFRAN) IV  4 mg Intravenous Once  . piperacillin-tazobactam (ZOSYN)  IV  3.375 g Intravenous Q8H  . sodium chloride  10-40 mL Intracatheter Q12H  . DISCONTD: HYDROmorphone PCA 0.3 mg/mL   Intravenous Q4H  . DISCONTD: HYDROmorphone PCA 0.3 mg/mL   Intravenous Q4H   Continuous Infusions:    . dextrose 5% lactated ringers with KCl 20 mEq/L 125 mL/hr at 09/27/12 1400  . DISCONTD: 0.9 % NaCl with KCl 40 mEq / L 50 mL/hr at 09/27/12 0800  . DISCONTD: lactated ringers 75 mL/hr at 09/27/12 0954    Principal Problem:  *Dehydration Active Problems:  Fibroid uterus  Graves disease  Chronic blood loss anemia  Chronic kidney disease    Time spent: 25 mins    MEMON,JEHANZEB  [facility] Pager [phone]. If 7PM-7AM, please contact night-coverage at [email or web address], password [facility] 09/27/2012, 4:56 PM  LOS: 4 days     Results of venous ultrasound of left upper extremity reviewed.  Discussed with Dr. Emelda Fear.  We will need to wait at least 24  hours before starting full dose anticoagulation for DVT, since she had undergone surgery earlier today.  Regarding the thyroid nodule that was noted, this will need to be worked up as an outpatient, once her acute issues have resolved.

## 2012-09-27 NOTE — Transfer of Care (Signed)
  Anesthesia Post-op Note  Patient: [name]  Procedure(s) Performed: Procedure(s) (LRB) with comments: HYSTERECTOMY SUPRACERVICAL ABDOMINAL (N/A)  Patient Location: PACU  Anesthesia Type: General  Level of Consciousness: awake, alert , oriented and patient cooperative  Airway and Oxygen Therapy: Patient Spontanous Breathing and Patient connected to face mask oxygen  Post-op Pain: mild  Post-op Assessment: Post-op Vital signs reviewed, Patient's Cardiovascular Status Stable, Respiratory Function Stable, Patent Airway and No signs of Nausea or vomiting  Post-op Vital Signs: Reviewed and stable  Complications: No apparent anesthesia complications

## 2012-09-27 NOTE — Anesthesia Procedure Notes (Signed)
Procedure Name: Intubation Date/Time: 09/27/2012 10:32 AM Performed by: Carolyne Littles, Geralynn Capri L Pre-anesthesia Checklist: Patient identified, Patient being monitored, Timeout performed, Emergency Drugs available and Suction available Patient Re-evaluated:Patient Re-evaluated prior to inductionOxygen Delivery Method: Circle System Utilized Preoxygenation: Pre-oxygenation with 100% oxygen Intubation Type: IV induction Ventilation: Mask ventilation without difficulty Laryngoscope Size: 3 and Miller Grade View: Grade I Tube type: Oral Tube size: 7.0 mm Number of attempts: 1 Airway Equipment and Method: stylet Placement Confirmation: ETT inserted through vocal cords under direct vision,  positive ETCO2 and breath sounds checked- equal and bilateral Secured at: 21 cm Tube secured with: Tape Dental Injury: Teeth and Oropharynx as per pre-operative assessment

## 2012-09-27 NOTE — Preoperative (Signed)
Beta Blockers   Reason not to administer Beta Blockers:Not Applicable 

## 2012-09-27 NOTE — Op Note (Signed)
See operative details included in brief operative note 

## 2012-09-28 ENCOUNTER — Inpatient Hospital Stay (HOSPITAL_COMMUNITY): Payer: 59

## 2012-09-28 DIAGNOSIS — E0590 Thyrotoxicosis, unspecified without thyrotoxic crisis or storm: Secondary | ICD-10-CM

## 2012-09-28 DIAGNOSIS — I82409 Acute embolism and thrombosis of unspecified deep veins of unspecified lower extremity: Secondary | ICD-10-CM

## 2012-09-28 DIAGNOSIS — E079 Disorder of thyroid, unspecified: Secondary | ICD-10-CM | POA: Diagnosis present

## 2012-09-28 LAB — COMPREHENSIVE METABOLIC PANEL
ALT: 6 U/L (ref 0–35)
Alkaline Phosphatase: 78 U/L (ref 39–117)
BUN: 15 mg/dL (ref 6–23)
CO2: 25 mEq/L (ref 19–32)
Chloride: 102 mEq/L (ref 96–112)
GFR calc Af Amer: 61 mL/min — ABNORMAL LOW (ref >90)
Glucose, Bld: 104 mg/dL — ABNORMAL HIGH (ref 70–99)
Potassium: 3.2 mEq/L — ABNORMAL LOW (ref 3.5–5.1)
Sodium: 134 mEq/L — ABNORMAL LOW (ref 135–145)
Total Bilirubin: 0.7 mg/dL (ref 0.3–1.2)
Total Protein: 6.8 g/dL (ref 6.0–8.3)

## 2012-09-28 LAB — TSH: TSH: 1.415 u[IU]/mL (ref 0.350–4.500)

## 2012-09-28 LAB — CBC
HCT: 32.9 % — ABNORMAL LOW (ref 36.0–46.0)
Hemoglobin: 10.5 g/dL — ABNORMAL LOW (ref 12.0–15.0)
MCHC: 31.9 g/dL (ref 30.0–36.0)
RBC: 4.1 MIL/uL (ref 3.87–5.11)
WBC: 7.4 10*3/uL (ref 4.0–10.5)

## 2012-09-28 LAB — CULTURE, BLOOD (ROUTINE X 2)

## 2012-09-28 LAB — HEPARIN LEVEL (UNFRACTIONATED): Heparin Unfractionated: 0.1 IU/mL — ABNORMAL LOW (ref 0.30–0.70)

## 2012-09-28 LAB — T3, FREE: T3, Free: 2.1 pg/mL — ABNORMAL LOW (ref 2.3–4.2)

## 2012-09-28 LAB — T4, FREE: Free T4: 1.1 ng/dL (ref 0.80–1.80)

## 2012-09-28 MED ORDER — HEPARIN (PORCINE) IN NACL 100-0.45 UNIT/ML-% IJ SOLN
14.0000 [IU]/kg/h | INTRAMUSCULAR | Status: DC
  Administered 2012-09-28 – 2012-09-29 (×2): 14 [IU]/kg/h via INTRAVENOUS
  Filled 2012-09-28 (×2): qty 250

## 2012-09-28 MED ORDER — METOPROLOL TARTRATE 1 MG/ML IV SOLN
INTRAVENOUS | Status: AC
  Filled 2012-09-28: qty 5

## 2012-09-28 MED ORDER — SODIUM CHLORIDE 0.9 % IJ SOLN
INTRAMUSCULAR | Status: AC
  Filled 2012-09-28: qty 3

## 2012-09-28 MED ORDER — POTASSIUM CHLORIDE CRYS ER 20 MEQ PO TBCR
40.0000 meq | EXTENDED_RELEASE_TABLET | Freq: Once | ORAL | Status: AC
  Administered 2012-09-28: 40 meq via ORAL
  Filled 2012-09-28: qty 2

## 2012-09-28 NOTE — Progress Notes (Signed)
ANTICOAGULATION CONSULT NOTE - Initial Consult  Pharmacy Consult for IV Heparin (if OK with Dr Emelda Fear) Indication: DVT  No Known Allergies  Patient Measurements: Height: 5\' 9"  (175.3 cm) Weight: 206 lb 12.7 oz (93.8 kg) IBW/kg (Calculated) : 66.2   Vital Signs: Temp: 97.4 F (36.3 C) (10/01 1118) Temp src: Oral (10/01 1118) BP: 105/68 mmHg (10/01 1200) Pulse Rate: 81  (10/01 1200)  Labs:  Basename 09/28/12 0439 09/27/12 0445 09/26/12 0513  HGB 10.5* 10.0* --  HCT 32.9* 31.2* 33.3*  PLT 238 274 314  APTT -- -- --  LABPROT -- -- --  INR -- -- --  HEPARINUNFRC -- -- --  CREATININE 1.24* 1.03 1.09  CKTOTAL -- -- --  CKMB -- -- --  TROPONINI -- -- --    Estimated Creatinine Clearance: 71.3 ml/min (by C-G formula based on Cr of 1.24).  Medical History: Past Medical History  Diagnosis Date  . Thyroid disease   . Fibroid tumor    Medications:  Scheduled:    . docusate sodium  100 mg Oral BID  . feeding supplement  1 Container Oral TID BM  . furosemide  20 mg Intravenous Once  . ketorolac  30 mg Intravenous Once  . ketorolac  30 mg Intravenous Q6H   Or  . ketorolac  30 mg Intramuscular Q6H  . pantoprazole (PROTONIX) IV  40 mg Intravenous QHS  . piperacillin-tazobactam (ZOSYN)  IV  3.375 g Intravenous Q8H  . potassium chloride  40 mEq Oral Once  . sodium chloride      . DISCONTD: antiseptic oral rinse  15 mL Mouth Rinse BID  . DISCONTD: HYDROmorphone PCA 0.3 mg/mL   Intravenous Q4H  . DISCONTD: HYDROmorphone PCA 0.3 mg/mL   Intravenous Q4H  . DISCONTD: multivitamin  5 mL Oral Daily  . DISCONTD: sodium chloride  10-40 mL Intracatheter Q12H   Assessment: 41yo female with acute DVT.  To begin IV Heparin per hospitalist IF OK with Dr Emelda Fear.  Nursing to page Dr Emelda Fear and ask about Rx.  Goal of Therapy:  Heparin level 0.3-0.7 units/ml Monitor platelets by anticoagulation protocol: Yes   Plan: Heparin at 14 units/Kg/hour if OK with Dr Emelda Fear Check  heparin level tonight and daily. CBC per protocol  Valrie Hart A 09/28/2012,12:46 PM

## 2012-09-28 NOTE — Progress Notes (Signed)
Lidocaine 2%         3mL injected              Thyroid biopsy performed

## 2012-09-28 NOTE — Progress Notes (Signed)
[facility] PROGRESS NOTE  CHRISTOL THETFORD ION:[id number] DOB: Aug 05, 1971 DOA: 09/23/2012 PCP: Tilda Burrow, MD  Assessment/Plan: Principal Problem:  *Dehydration Active Problems:  Fibroid uterus  Graves disease  Chronic blood loss anemia  Chronic kidney disease  Acute DVT (deep venous thrombosis)  Thyroid nodule  1. Necrotic uterine fibroid.  Patient is on antibiotic coverage with zosyn.  She is s/p hysterectomy yesterday.  GYN following.  Will continue antibiotics for now.  Drain in place. 2. Dehydration. resolved. 3. Acute renal failure secondary to dehydration. resolved 4. Anemia due to chronic blood loss from uterine fibroids. Patient has been transfused a total of 4 units of packed red blood cells during this admission 5. Fever likely due to #1. Resolved. 6. Graves' disease. Patient has had poor followup in the outpatient setting for thyroid uptake scan. Endocrinology is following. 7. Severe protein calorie malnutrition. Nutrition consult. Advance diet as tolerated.  8. Left upper extremity DVT.  Will need to start anticoagulation, but with patient's surgery done yesterday, will need to wait at least 24 hours from surgery.  With her edema in RUE and b/l LE, will rule out DVT in those limbs as well. Discussed with hematology and recommendations were to send hypercoagulable panel. 9. Thyroid mass.  Patient found to have a thyroid mass on ultrasound. Discussed with Dr. Fransico Him, and with patient's history of poor follow up, and now need for anticoagulation, it may be beneficial to have this biopsied while she is admitted. Will request an ultrasound guided biopsy. Heparin can be held prior to biopsy.  Code Status: Full code Family Communication: discussed with patient Disposition Plan: discharge home when medically stable   Brief narrative: This lady was admitted to the hospital with significant anemia and was acutely dehydrated. She was started on IV fluids and was given a  blood transfusion. During her hospital stay she became more confused and somewhat hypotensive requiring transfer to the step down unit. Currently she is being evaluated for her uterine fibroids an underlying bacterial vaginosis. She does have a history of noncompliance and has not undergone outpatient workup for her hyperthyroidism.  Consultants:  Gynecology, Dr. Emelda Fear  Endocrinology, Dr. Fransico Him  Procedures:  Hysterectomy 9/30 by Dr. Emelda Fear  Antibiotics:  Zosyn  HPI/Subjective: Feeling better today, pain improving, no complaints  Objective: Filed Vitals:   09/28/12 0700 09/28/12 0755 09/28/12 0800 09/28/12 0900  BP: 118/81  115/84 108/70  Pulse: 71  76 75  Temp:  97.8 F (36.6 C)    TempSrc:  Oral    Resp: 13  13 16   Height:      Weight:      SpO2: 99%  100% 99%    Intake/Output Summary (Last 24 hours) at 09/28/12 1036 Last data filed at 09/28/12 0900  Gross per 24 hour  Intake 4474.4 ml  Output   1600 ml  Net 2874.4 ml   Filed Weights   09/26/12 0600 09/27/12 0500 09/28/12 0500  Weight: 97.4 kg (214 lb 11.7 oz) 96 kg (211 lb 10.3 oz) 93.8 kg (206 lb 12.7 oz)    Exam:   General:  NAD  Cardiovascular: s1, s2, rrr  Respiratory: cta b  Abdomen: soft, distended, bs+  EXT: left UE 2+ edema, RUE 1+ edema, 2+ edema in the LE b/l  Data Reviewed: Basic Metabolic Panel:  Lab 09/28/12 2440 09/27/12 0445 09/26/12 0513 09/25/12 1020 09/25/12 0508  NA 134* 134* 130* 136 136  K 3.2* 3.4* 3.2* 3.4* 3.5  CL 102 102  98 103 105  CO2 25 23 22 24 24   GLUCOSE 104* 94 104* 101* 100*  BUN 15 13 15 20 21   CREATININE 1.24* 1.03 1.09 1.21* 1.20*  CALCIUM 8.5 8.5 8.6 8.8 8.6  MG -- -- -- -- --  PHOS -- -- -- -- --   Liver Function Tests:  Lab 09/28/12 0439 09/27/12 0445 09/25/12 0508 09/24/12 0428 09/23/12 1415  AST 8 9 17 20  33  ALT 6 9 17 19 26   ALKPHOS 78 95 129* 108 135*  BILITOT 0.7 0.9 1.0 1.1 1.6*  PROT 6.8 7.5 8.1 7.2 8.2  ALBUMIN 1.5* 1.8* 1.6* 1.5*  1.8*   No results found for this basename: LIPASE:5,AMYLASE:5 in the last 168 hours No results found for this basename: AMMONIA:5 in the last 168 hours CBC:  Lab 09/28/12 0439 09/27/12 0445 09/26/12 0513 09/25/12 0508 09/24/12 0943  WBC 7.4 7.9 11.4* 11.6* 8.7  NEUTROABS -- -- -- -- --  HGB 10.5* 10.0* 10.6* 11.0* 9.5*  HCT 32.9* 31.2* 33.3* 33.1* 28.9*  MCV 80.2 79.0 77.4* 77.3* 77.1*  PLT 238 274 314 316 307   Cardiac Enzymes: No results found for this basename: CKTOTAL:5,CKMB:5,CKMBINDEX:5,TROPONINI:5 in the last 168 hours BNP (last 3 results)  Basename 08/28/12 2145  PROBNP 617.9*   CBG:  Lab 09/23/12 1709  GLUCAP 90    Recent Results (from the past 240 hour(s))  URINE CULTURE     Status: Normal   Collection Time   09/23/12  2:25 PM      Component Value Range Status Comment   Specimen Description URINE, CLEAN CATCH   Final    Special Requests NONE   Final    Culture  Setup Time 09/24/2012 01:05   Final    Colony Count NO GROWTH   Final    Culture NO GROWTH   Final    Report Status 09/24/2012 FINAL   Final   CULTURE, BLOOD (ROUTINE X 2)     Status: Normal (Preliminary result)   Collection Time   09/23/12  8:39 PM      Component Value Range Status Comment   Specimen Description BLOOD LEFT HAND   Final    Special Requests BOTTLES DRAWN AEROBIC AND ANAEROBIC 6CC   Final    Culture NO GROWTH 4 DAYS   Final    Report Status PENDING   Incomplete   CULTURE, BLOOD (ROUTINE X 2)     Status: Normal (Preliminary result)   Collection Time   09/23/12  8:41 PM      Component Value Range Status Comment   Specimen Description BLOOD LEFT ANTECUBITAL   Final    Special Requests BOTTLES DRAWN AEROBIC AND ANAEROBIC 6CC   Final    Culture NO GROWTH 4 DAYS   Final    Report Status PENDING   Incomplete   CULTURE, ROUTINE-GENITAL     Status: Normal   Collection Time   09/23/12 11:50 PM      Component Value Range Status Comment   Specimen Description VAGINA   Final    Special  Requests DISCHARGE   Final    Culture NORMAL VAGINAL FLORA   Final    Report Status 09/27/2012 FINAL   Final   MRSA PCR SCREENING     Status: Normal   Collection Time   09/24/12  2:44 AM      Component Value Range Status Comment   MRSA by PCR NEGATIVE  NEGATIVE Final   WET PREP, GENITAL  Status: Abnormal   Collection Time   09/24/12  2:35 PM      Component Value Range Status Comment   Yeast Wet Prep HPF POC NONE SEEN  NONE SEEN Final    Trich, Wet Prep NONE SEEN  NONE SEEN Final    Clue Cells Wet Prep HPF POC RARE (*) NONE SEEN Final    WBC, Wet Prep HPF POC TOO NUMEROUS TO COUNT (*) NONE SEEN Final      Studies: US Venous Img Upper Uni Left  09/27/2012  *RADIOLOGY REPORT*  Clinical Data: Left arm pain and swelling.  History of a central line on the right.  UPPER LEFT VENOUS EXTREMITY ULTRASOUND  Comparison: None.  Findings: The left brachial vein is filled with hypoechoic material with no internal blood flow seen with color Doppler or pulsed Doppler.  The vein did not compress normally.  The remainder of the examined deep veins of the left arm have normal appearances, including the left subclavian and jugular veins.  No additional thrombi were seen.  There is a large heterogeneous mass in the left lobe of the thyroid gland, with solid and cystic components.  This is predominately solid and measures 4.9 x 3.5 x 2.9 cm in maximum dimensions.  Also demonstrated are mildly prominent lymph nodes in the lateral neck on the left.  The largest has a short axis diameter of 5 mm.  IMPRESSION:  1.  Deep venous thrombus completely occluding the left brachial vein. 2.  4.9 cm left lobe thyroid nodule.  This is suspicious for malignancy and will require biopsy correlation.  An elective dedicated thyroid ultrasound is recommended. 3.  Mildly prominent left neck lymph nodes laterally.   Original Report Authenticated By: Darrol Angel, M.D.    Dg Chest Port 1 View  09/26/2012  *RADIOLOGY REPORT*  Clinical  Data: PICC placement.  PORTABLE CHEST - 1 VIEW  Comparison: Chest 09/25/2012.  Findings: New right PICC is in place with tip projecting in the superior cavoatrial junction.  The lung volumes are low.  Left basilar airspace disease is again seen.  No pneumothorax. Cardiomegaly.  IMPRESSION:  1.  Tip of right PICC projects over the superior cavoatrial junction. 2.  No change in left basilar airspace disease, likely atelectasis.   Original Report Authenticated By: Bernadene Bell. D'ALESSIO, M.D.     Scheduled Meds:    . docusate sodium  100 mg Oral BID  . feeding supplement  1 Container Oral TID BM  . furosemide  20 mg Intravenous Once  . ketorolac  30 mg Intravenous Once  . ketorolac  30 mg Intravenous Q6H   Or  . ketorolac  30 mg Intramuscular Q6H  . pantoprazole (PROTONIX) IV  40 mg Intravenous QHS  . piperacillin-tazobactam (ZOSYN)  IV  3.375 g Intravenous Q8H  . sodium chloride      . DISCONTD: antiseptic oral rinse  15 mL Mouth Rinse BID  . DISCONTD: HYDROmorphone PCA 0.3 mg/mL   Intravenous Q4H  . DISCONTD: HYDROmorphone PCA 0.3 mg/mL   Intravenous Q4H  . DISCONTD: multivitamin  5 mL Oral Daily  . DISCONTD: sodium chloride  10-40 mL Intracatheter Q12H   Continuous Infusions:    . dextrose 5% lactated ringers with KCl 20 mEq/L 75 mL/hr at 09/28/12 0900  . DISCONTD: 0.9 % NaCl with KCl 40 mEq / L 50 mL/hr at 09/27/12 0800  . DISCONTD: lactated ringers 75 mL/hr at 09/27/12 0954    Principal Problem:  *Dehydration Active Problems:  Fibroid uterus  Graves disease  Chronic blood loss anemia  Chronic kidney disease  Acute DVT (deep venous thrombosis)  Thyroid nodule    Time spent: 25 mins    MEMON,JEHANZEB  [facility] Pager [phone]. If 7PM-7AM, please contact night-coverage at [email or web address], password [facility] 09/28/2012, 10:36 AM  LOS: 5 days

## 2012-09-28 NOTE — Progress Notes (Signed)
NAMEJARYIAH, [name]              ACCOUNT NO.:  [id number]  MEDICAL RECORD NO.:  [id number]  LOCATION:  IC03                          FACILITY:  [facility]  PHYSICIAN:  Purcell Nails, MD DATE OF BIRTH:  1971/03/12  DATE OF PROCEDURE:  09/28/2012 DATE OF DISCHARGE:                                PROGRESS NOTE   REASON FOR FOLLOW UP:  Hyperthyroidism.  SUBJECTIVE:  The patient is on postop day #1 after hysterectomy yesterday for necrotic large uterine myoma.  She is still in the ICU on antibiotics and close monitoring.  OBJECTIVE:  GENERAL:  The patient feels better.  She is oriented x3. She is on dextrose 5% and Ringer's lactate at 75 mL/h. VITAL SIGNS:  Show blood pressure 105/68, pulse rate 81, temperature 97.4. HEENT:  Well hydrated. CHEST:  Clear to auscultation bilaterally.  CARDIOVASCULAR:  Distant heart sounds.  Otherwise no murmur, no gallop. ABDOMEN:  Soft.  Bowel sounds hypoactive.  Surgical wound is dressed. EXTREMITIES:  Slightly better edema state.  Her repeat TFTs are still pending.  Her other chemistry is stable.  Her hemoglobin is 10.5 with WBC of 7.4.  ASSESSMENT: 1. Hyperthyroidism, controlled.  Awaiting thyroid uptake and scan as     an outpatient.  I have reordered TSH free T4 and free T3 to monitor     her thyroid hormone. 2. Uterine fibroids, status post hysterectomy. 3. Blood loss anemia, status post multiple blood transfusions. 4. Chronic kidney disease. 5. Acute deep venous thrombosis, new diagnosis. 6. New finding of 4.9 cm left thyroid lobe.  The nodule on the upper     extremity Doppler ultrasound.  PLAN: 1. I will obtain a dedicated thyroid ultrasound to characterize the     incidental finding of left lobe thyroid nodule. 2. As I mentioned above, I will repeat thyroid function test.  We will     not need thyroid function on intervention at this point.  If the     thyroid nodule is confirmed she will probably require ultrasound-     guided  fine-needle aspiration.          ______________________________ Purcell Nails, MD     GN/MEDQ  D:  09/28/2012  T:  09/28/2012  Job:  [id number]

## 2012-09-28 NOTE — Clinical Social Work Note (Signed)
CSW met w patient and husband at bedside.  Patient lives at home w husband, no children live in the home.  Discussed patient's need for support as she recovers from surgery and other medical issues, stressed need for consistent in home support.  Patient states that she often cannot go to bathroom or shower without assist.  Husband works second shift supervisory job, is gone from home approx 3 PM - 4 AM, then asleep until 10 AM.  Job schedule limits his availability to assist.  Patient states she has church friends and a neighbor who are willing to help, encouraged patient and husband to enlist support so she has people at home to help at discharge.  Patient and husband need information on impact of hormonal imbalances on patient's well being, as well as help understanding diet recommendations given to address nutritional issues.  Spoke w RN who will help w explaining medical issues to patient and husband.  Husband seems to appreciate getting greater understanding of wife's medical condition and patient is agreeable to his being involved in finding solutions to her home care needs.  Santa Genera, LCSW Clinical Social Worker [phone])

## 2012-09-28 NOTE — Addendum Note (Signed)
Addendum  created 09/28/12 0949 by Marolyn Hammock, CRNA   Modules edited:Notes Section

## 2012-09-28 NOTE — Anesthesia Postprocedure Evaluation (Signed)
  Anesthesia Post-op Note  Patient: [name]  Procedure(s) Performed: Procedure(s) (LRB) with comments: HYSTERECTOMY SUPRACERVICAL ABDOMINAL (N/A)  Patient Location: ICU 3  Anesthesia Type: General  Level of Consciousness: awake, alert , oriented and patient cooperative  Airway and Oxygen Therapy: Patient Spontanous Breathing  Post-op Pain: moderate  Post-op Assessment: Post-op Vital signs reviewed, Patient's Cardiovascular Status Stable, Respiratory Function Stable, Patent Airway and No signs of Nausea or vomiting  Post-op Vital Signs: Reviewed and stable  Complications: No apparent anesthesia complications

## 2012-09-28 NOTE — Procedures (Signed)
Successful [place] bilateral throid dominant nodule FNA bx No comp Stable Full report in PACS PATH PENDING

## 2012-09-28 NOTE — Progress Notes (Signed)
1 Day Post-Op Procedure(s) (LRB): HYSTERECTOMY SUPRACERVICAL ABDOMINAL (N/A)  Subjective: Patient reports tolerating PO.  And hungry. Patient looks improved, despite all the challenges she's facing.  The rapid response with Thyroid evaluation is appreciated.   Objective: I have reviewed patient's vital signs, labs and radiology results. She will stay on antibiotic until afebrile x 48 hours, as she was on pre-op therapeutic antibiotics. Ultrasounds of extremities notable for upper extremity DVT bilateral, and lower extremity on left popliteal vein, with good collateral flow.  These are likely not recent clots, as 4-extremity edema dates back to last eval. General: alert, cooperative and no distress Resp: clear to auscultation bilaterally GI: normal findings: bowel sounds normal and incision: clean, dry and JP drain  20 cc/day.  Still in place. Foley in place due to patient's  Multiple comorbidities and deconditioning.   Assessment: s/p Procedure(s) (LRB) with comments: HYSTERECTOMY SUPRACERVICAL ABDOMINAL (N/A): stable and tolerating diet  Plan: Advance diet Continue foley due to patient critically ill and patient in ICU will continue antibiotics til 48 hours postop.  LOS: 5 days    [name] 09/28/2012, 5:18 PM

## 2012-09-28 NOTE — Progress Notes (Signed)
Patient ID: [name], female   DOB: 1971/07/16, 41 y.o.   MRN: [id number]

## 2012-09-29 LAB — CBC
HCT: 33.4 % — ABNORMAL LOW (ref 36.0–46.0)
Hemoglobin: 10.7 g/dL — ABNORMAL LOW (ref 12.0–15.0)
RDW: 20.1 % — ABNORMAL HIGH (ref 11.5–15.5)
WBC: 8.1 10*3/uL (ref 4.0–10.5)

## 2012-09-29 LAB — TYPE AND SCREEN
Unit division: 0
Unit division: 0
Unit division: 0
Unit division: 0

## 2012-09-29 LAB — BASIC METABOLIC PANEL
BUN: 16 mg/dL (ref 6–23)
Chloride: 104 mEq/L (ref 96–112)
GFR calc Af Amer: 55 mL/min — ABNORMAL LOW (ref >90)
Glucose, Bld: 87 mg/dL (ref 70–99)
Potassium: 3.4 mEq/L — ABNORMAL LOW (ref 3.5–5.1)

## 2012-09-29 LAB — BETA-2-GLYCOPROTEIN I ABS, IGG/M/A
Beta-2 Glyco I IgG: 0 G Units (ref <20)
Beta-2-Glycoprotein I IgM: 2 M Units (ref <20)

## 2012-09-29 LAB — LUPUS ANTICOAGULANT PANEL: Lupus Anticoagulant: DETECTED — AB

## 2012-09-29 LAB — PROTEIN C ACTIVITY: Protein C Activity: 97 % (ref 75–133)

## 2012-09-29 LAB — PROTEIN C, TOTAL: Protein C, Total: 72 % (ref 72–160)

## 2012-09-29 MED ORDER — COUMADIN BOOK
Freq: Once | Status: AC
  Administered 2012-09-29: 18:00:00
  Filled 2012-09-29: qty 1

## 2012-09-29 MED ORDER — ADULT MULTIVITAMIN LIQUID CH
5.0000 mL | Freq: Every day | ORAL | Status: DC
  Administered 2012-09-29 – 2012-10-02 (×4): 5 mL via ORAL
  Filled 2012-09-29 (×6): qty 5

## 2012-09-29 MED ORDER — WARFARIN SODIUM 10 MG PO TABS
10.0000 mg | ORAL_TABLET | Freq: Once | ORAL | Status: AC
  Administered 2012-09-29: 10 mg via ORAL
  Filled 2012-09-29: qty 1

## 2012-09-29 MED ORDER — ENSURE COMPLETE PO LIQD
237.0000 mL | Freq: Two times a day (BID) | ORAL | Status: DC
  Administered 2012-09-30 – 2012-10-07 (×9): 237 mL via ORAL

## 2012-09-29 MED ORDER — WARFARIN VIDEO
Freq: Once | Status: AC
  Administered 2012-09-29: 22:00:00

## 2012-09-29 MED ORDER — HEPARIN (PORCINE) IN NACL 100-0.45 UNIT/ML-% IJ SOLN
2000.0000 [IU]/h | INTRAMUSCULAR | Status: DC
  Administered 2012-09-29: 1700 [IU]/h via INTRAVENOUS
  Administered 2012-09-29 – 2012-09-30 (×2): 2000 [IU]/h via INTRAVENOUS
  Filled 2012-09-29 (×2): qty 250

## 2012-09-29 MED ORDER — WARFARIN - PHARMACIST DOSING INPATIENT: Freq: Every day | Status: DC

## 2012-09-29 MED ORDER — PANTOPRAZOLE SODIUM 40 MG PO TBEC
40.0000 mg | DELAYED_RELEASE_TABLET | Freq: Every day | ORAL | Status: DC
  Administered 2012-09-29 – 2012-10-07 (×7): 40 mg via ORAL
  Filled 2012-09-29 (×7): qty 1

## 2012-09-29 NOTE — Progress Notes (Addendum)
[facility] PROGRESS NOTE  [name] ZOX:[id number] DOB: [birth year] DOA: 09/23/2012 PCP: Tilda Burrow, MD  Assessment/Plan: Principal Problem:  *Dehydration Active Problems:  Fibroid uterus  Graves disease  Chronic blood loss anemia  Chronic kidney disease  Acute DVT (deep venous thrombosis)  Thyroid mass  1. Necrotic uterine fibroid.  Patient is on antibiotic coverage with zosyn.  She is s/p hysterectomy.  GYN following.  Will continue antibiotics for now.  Drain in place. 2. Dehydration. resolved. 3. Acute renal failure secondary to dehydration. resolved 4. Anemia due to chronic blood loss from uterine fibroids. Patient has been transfused a total of 4 units of packed red blood cells during this admission.  Hemoglobin is stable. 5. Fever likely due to #1. Resolved. 6. Graves' disease. Patient has had poor followup in the outpatient setting for thyroid uptake scan. Endocrinology is following. She will need further work up for this once she is discharged. 7. Severe protein calorie malnutrition. Nutrition consult. Advance diet as tolerated.  8. Bilateral upper extremity and LLE DVT.  Hypercoaguable panel was sent and patient has been started on heparin. She has not had any signs of bleeding. Will defer timing of starting coumadin to Dr. Emelda Fear.  I think the patient would need to be therapeutic INR prior to discharge. 9. Thyroid mass.  Patient underwent thyroid biopsy yesterday.  Will follow up results  Code Status: Full code Family Communication: discussed with patient Disposition Plan: discharge home when medically stable, transfer telemetry today   Brief narrative: This lady was admitted to the hospital with significant anemia and was acutely dehydrated. She was started on IV fluids and was given a blood transfusion. During her hospital stay she became more confused and somewhat hypotensive requiring transfer to the step down unit. Currently she is being evaluated  for her uterine fibroids an underlying bacterial vaginosis. She does have a history of noncompliance and has not undergone outpatient workup for her hyperthyroidism.  Consultants:  Gynecology, Dr. Emelda Fear  Endocrinology, Dr. Fransico Him  Procedures:  Hysterectomy 9/30 by Dr. Emelda Fear  Antibiotics:  Zosyn  HPI/Subjective: No new complaints, wants to get up and move around  Objective: Filed Vitals:   09/29/12 0500 09/29/12 0600 09/29/12 0700 09/29/12 0800  BP: 100/69 102/77 112/76 110/72  Pulse:   67 79  Temp:    97.6 F (36.4 C)  TempSrc:    Oral  Resp: 10 12 15 14   Height:      Weight: 93.8 kg (206 lb 12.7 oz)     SpO2:  98% 100% 100%    Intake/Output Summary (Last 24 hours) at 09/29/12 1003 Last data filed at 09/29/12 0802  Gross per 24 hour  Intake 2243.68 ml  Output    800 ml  Net 1443.68 ml   Filed Weights   09/27/12 0500 09/28/12 0500 09/29/12 0500  Weight: 96 kg (211 lb 10.3 oz) 93.8 kg (206 lb 12.7 oz) 93.8 kg (206 lb 12.7 oz)    Exam:   General:  NAD  Cardiovascular: s1, s2, rrr  Respiratory: cta b  Abdomen: soft, distended, bs+  EXT: left UE 2+ edema, RUE 1+ edema, 1-2+ edema in the LE b/l  Data Reviewed: Basic Metabolic Panel:  Lab 09/29/12 8119 09/28/12 0439 09/27/12 0445 09/26/12 0513 09/25/12 1020  NA 136 134* 134* 130* 136  K 3.4* 3.2* 3.4* 3.2* 3.4*  CL 104 102 102 98 103  CO2 25 25 23 22 24   GLUCOSE 87 104* 94 104* 101*  BUN  16 15 13 15 20   CREATININE 1.35* 1.24* 1.03 1.09 1.21*  CALCIUM 8.5 8.5 8.5 8.6 8.8  MG -- -- -- -- --  PHOS -- -- -- -- --   Liver Function Tests:  Lab 09/28/12 0439 09/27/12 0445 09/25/12 0508 09/24/12 0428 09/23/12 1415  AST 8 9 17 20  33  ALT 6 9 17 19 26   ALKPHOS 78 95 129* 108 135*  BILITOT 0.7 0.9 1.0 1.1 1.6*  PROT 6.8 7.5 8.1 7.2 8.2  ALBUMIN 1.5* 1.8* 1.6* 1.5* 1.8*   No results found for this basename: LIPASE:5,AMYLASE:5 in the last 168 hours No results found for this basename: AMMONIA:5 in  the last 168 hours CBC:  Lab 09/29/12 0434 09/28/12 0439 09/27/12 0445 09/26/12 0513 09/25/12 0508  WBC 8.1 7.4 7.9 11.4* 11.6*  NEUTROABS -- -- -- -- --  HGB 10.7* 10.5* 10.0* 10.6* 11.0*  HCT 33.4* 32.9* 31.2* 33.3* 33.1*  MCV 80.7 80.2 79.0 77.4* 77.3*  PLT 252 238 274 314 316   Cardiac Enzymes: No results found for this basename: CKTOTAL:5,CKMB:5,CKMBINDEX:5,TROPONINI:5 in the last 168 hours BNP (last 3 results)  Basename 08/28/12 2145  PROBNP 617.9*   CBG:  Lab 09/23/12 1709  GLUCAP 90    Recent Results (from the past 240 hour(s))  URINE CULTURE     Status: Normal   Collection Time   09/23/12  2:25 PM      Component Value Range Status Comment   Specimen Description URINE, CLEAN CATCH   Final    Special Requests NONE   Final    Culture  Setup Time 09/24/2012 01:05   Final    Colony Count NO GROWTH   Final    Culture NO GROWTH   Final    Report Status 09/24/2012 FINAL   Final   CULTURE, BLOOD (ROUTINE X 2)     Status: Normal   Collection Time   09/23/12  8:39 PM      Component Value Range Status Comment   Specimen Description BLOOD LEFT HAND   Final    Special Requests BOTTLES DRAWN AEROBIC AND ANAEROBIC 6CC   Final    Culture NO GROWTH 5 DAYS   Final    Report Status 09/28/2012 FINAL   Final   CULTURE, BLOOD (ROUTINE X 2)     Status: Normal   Collection Time   09/23/12  8:41 PM      Component Value Range Status Comment   Specimen Description BLOOD LEFT ANTECUBITAL   Final    Special Requests BOTTLES DRAWN AEROBIC AND ANAEROBIC 6CC   Final    Culture NO GROWTH 5 DAYS   Final    Report Status 09/28/2012 FINAL   Final   CULTURE, ROUTINE-GENITAL     Status: Normal   Collection Time   09/23/12 11:50 PM      Component Value Range Status Comment   Specimen Description VAGINA   Final    Special Requests DISCHARGE   Final    Culture NORMAL VAGINAL FLORA   Final    Report Status 09/27/2012 FINAL   Final   MRSA PCR SCREENING     Status: Normal   Collection Time    09/24/12  2:44 AM      Component Value Range Status Comment   MRSA by PCR NEGATIVE  NEGATIVE Final   WET PREP, GENITAL     Status: Abnormal   Collection Time   09/24/12  2:35 PM      Component  Value Range Status Comment   Yeast Wet Prep HPF POC NONE SEEN  NONE SEEN Final    Trich, Wet Prep NONE SEEN  NONE SEEN Final    Clue Cells Wet Prep HPF POC RARE (*) NONE SEEN Final    WBC, Wet Prep HPF POC TOO NUMEROUS TO COUNT (*) NONE SEEN Final      Studies: US Soft Tissue Head/neck  09/28/2012  *RADIOLOGY REPORT*  Clinical Data: Thyroid nodule  THYROID ULTRASOUND  Technique: Ultrasound examination of the thyroid gland and adjacent soft tissues was performed.  Comparison:  None.  Findings:  Right thyroid lobe:  23 x 33 x 15 mm, inhomogeneous echotexture Left thyroid lobe:  29 x 31 x 55 mm Isthmus:  1.2 cm in thickness  Focal nodules:  18 x 20 x 31 mm solid, mid-right 25 x 29 x 50 mm solid, mid-left There are additional much smaller nodules bilaterally.  Lymphadenopathy:  None visualized.  IMPRESSION:  Bilateral thyroid nodules.  The dominant left and right lesions meet consensus criteria for biopsy.  Ultrasound-guided fine needle aspiration should be considered, as per the consensus statement: Management of Thyroid Nodules Detected at [place]:  Society of Radiologists in Ultrasound Consensus Conference Statement. Radiology 2005; [id number].   Original Report Authenticated By: Osa Craver, M.D.    US Guided Needle Placement  09/28/2012  *RADIOLOGY REPORT*  Clinical Data:  Dominant bilateral thyroid nodules multinodular thyroid gland  ULTRASOUND GUIDED NEEDLE ASPIRATE BIOPSY OF THE THYROID GLAND  Comparison: 09/28/2012  Thyroid biopsy was thoroughly discussed with the patient and questions were answered.  The benefits, risks, alternatives, and complications were also discussed.  The patient understands and wishes to proceed with the procedure.  Written consent was obtained.  Ultrasound was performed to  localize and mark an adequate site for the biopsy.  The patient was then prepped and draped in a normal sterile fashion.  Local anesthesia was provided with 1% lidocaine. Using direct ultrasound guidance, 3 passes were made using 25 gauge needles into the nodule within the right lobe of the thyroid. Ultrasound was used to confirm needle placements on all occasions. Specimens were sent to Pathology for analysis.  Complications:  No immediate  Findings:  Imaging confirms needle placed in the dominant right thyroid mass  IMPRESSION: Ultrasound guided needle aspirate biopsy performed of the dominant right thyroid nodule.  ULTRASOUND GUIDED NEEDLE ASPIRATE BIOPSY OF THE THYROID GLAND  Comparison: 09/28/2012  Thyroid biopsy was thoroughly discussed with the patient and questions were answered.  The benefits, risks, alternatives, and complications were also discussed.  The patient understands and wishes to proceed with the procedure.  Written consent was obtained.  Ultrasound was performed to localize and mark an adequate site for the biopsy.  The patient was then prepped and draped in a normal sterile fashion.  Local anesthesia was provided with 1% lidocaine. Using direct ultrasound guidance, 3 passes were made using 25 gauge needles into the nodule within the left lobe of the thyroid. Ultrasound was used to confirm needle placements on all occasions. Specimens were sent to Pathology for analysis.  Complications:  No immediate  Findings:  Imaging confirms needle placed in the dominant left thyroid mass  IMPRESSION: Ultrasound guided needle aspirate biopsy performed of the dominant left thyroid nodule.   Original Report Authenticated By: Judie Petit. Ruel Favors, M.D.    US Venous Img Lower Bilateral  09/28/2012  *RADIOLOGY REPORT*  Clinical Data: Pain, swelling, history DVT.  BILATERAL LOWER EXTREMITY VENOUS DOPPLER  ULTRASOUND  Technique: Gray-scale sonography with compression, as well as color and duplex ultrasound, were  performed to evaluate the deep venous system from the level of the common femoral vein through the popliteal and proximal calf veins.  Comparison: 08/29/2012  Findings: On the right, normal compressibility of  the common femoral, superficial femoral, and popliteal veins, as well as the proximal calf veins.  No filling defects to suggest DVT on grayscale or color Doppler imaging.  Doppler waveforms show normal direction of venous flow, normal respiratory phasicity and response to augmentation.  On the left, there is partially occlusive noncompressible thrombus in the popliteal vein.  The femoral vein, profunda femoris, and common femoral vein as well as the saphenofemoral junction remain widely patent without central extension of thrombus.  IMPRESSION: 1.  Left popliteal DVT. 2.  No evidence of right lower extremity DVT.   Original Report Authenticated By: Osa Craver, M.D.    US Venous Img Upper Uni Left  09/27/2012  *RADIOLOGY REPORT*  Clinical Data: Left arm pain and swelling.  History of a central line on the right.  UPPER LEFT VENOUS EXTREMITY ULTRASOUND  Comparison: None.  Findings: The left brachial vein is filled with hypoechoic material with no internal blood flow seen with color Doppler or pulsed Doppler.  The vein did not compress normally.  The remainder of the examined deep veins of the left arm have normal appearances, including the left subclavian and jugular veins.  No additional thrombi were seen.  There is a large heterogeneous mass in the left lobe of the thyroid gland, with solid and cystic components.  This is predominately solid and measures 4.9 x 3.5 x 2.9 cm in maximum dimensions.  Also demonstrated are mildly prominent lymph nodes in the lateral neck on the left.  The largest has a short axis diameter of 5 mm.  IMPRESSION:  1.  Deep venous thrombus completely occluding the left brachial vein. 2.  4.9 cm left lobe thyroid nodule.  This is suspicious for malignancy and will require  biopsy correlation.  An elective dedicated thyroid ultrasound is recommended. 3.  Mildly prominent left neck lymph nodes laterally.   Original Report Authenticated By: Darrol Angel, M.D.    US Venous Img Upper Uni Right  09/28/2012  *RADIOLOGY REPORT*  Clinical Data: PICC line in place.  Arm swelling.  Pain.  Edema.  RIGHT UPPER EXTREMITY VENOUS DUPLEX ULTRASOUND  Technique:  Gray-scale sonography with graded compression, as well as color Doppler and duplex ultrasound were performed to evaluate the upper extremity deep venous system from the level of the subclavian vein and including the jugular, axillary, basilic and upper cephalic vein.  Spectral Doppler was utilized to evaluate flow at rest and with distal augmentation maneuvers.  Comparison:  None.  Findings: Normal color signal and wave forms in the visualized portions of right internal jugular vein.  There is diffuse partially occlusive thrombus along the left arm PICC line extending through visualized portions of right brachial and axillary veins.  There is also partially occlusive thrombus noted in the right basilic and radial veins and partial thrombosis in the right cephalic vein below the elbow. Ulnar vein is unremarkable in its visualized segments.  Subcutaneous edema is evident in the forearm.  IMPRESSION:  1.  Right upper extremity DVT as above.   Original Report Authenticated By: Osa Craver, M.D.    US Thyroid Biopsy  09/28/2012  *RADIOLOGY REPORT*  Clinical Data:  Dominant bilateral thyroid nodules  multinodular thyroid gland  ULTRASOUND GUIDED NEEDLE ASPIRATE BIOPSY OF THE THYROID GLAND  Comparison: 09/28/2012  Thyroid biopsy was thoroughly discussed with the patient and questions were answered.  The benefits, risks, alternatives, and complications were also discussed.  The patient understands and wishes to proceed with the procedure.  Written consent was obtained.  Ultrasound was performed to localize and mark an adequate site for  the biopsy.  The patient was then prepped and draped in a normal sterile fashion.  Local anesthesia was provided with 1% lidocaine. Using direct ultrasound guidance, 3 passes were made using 25 gauge needles into the nodule within the right lobe of the thyroid. Ultrasound was used to confirm needle placements on all occasions. Specimens were sent to Pathology for analysis.  Complications:  No immediate  Findings:  Imaging confirms needle placed in the dominant right thyroid mass  IMPRESSION: Ultrasound guided needle aspirate biopsy performed of the dominant right thyroid nodule.  ULTRASOUND GUIDED NEEDLE ASPIRATE BIOPSY OF THE THYROID GLAND  Comparison: 09/28/2012  Thyroid biopsy was thoroughly discussed with the patient and questions were answered.  The benefits, risks, alternatives, and complications were also discussed.  The patient understands and wishes to proceed with the procedure.  Written consent was obtained.  Ultrasound was performed to localize and mark an adequate site for the biopsy.  The patient was then prepped and draped in a normal sterile fashion.  Local anesthesia was provided with 1% lidocaine. Using direct ultrasound guidance, 3 passes were made using 25 gauge needles into the nodule within the left lobe of the thyroid. Ultrasound was used to confirm needle placements on all occasions. Specimens were sent to Pathology for analysis.  Complications:  No immediate  Findings:  Imaging confirms needle placed in the dominant left thyroid mass  IMPRESSION: Ultrasound guided needle aspirate biopsy performed of the dominant left thyroid nodule.   Original Report Authenticated By: Judie Petit. Ruel Favors, M.D.    US Thyroid Biopsy  09/28/2012  *RADIOLOGY REPORT*  Clinical Data:  Dominant bilateral thyroid nodules multinodular thyroid gland  ULTRASOUND GUIDED NEEDLE ASPIRATE BIOPSY OF THE THYROID GLAND  Comparison: 09/28/2012  Thyroid biopsy was thoroughly discussed with the patient and questions were  answered.  The benefits, risks, alternatives, and complications were also discussed.  The patient understands and wishes to proceed with the procedure.  Written consent was obtained.  Ultrasound was performed to localize and mark an adequate site for the biopsy.  The patient was then prepped and draped in a normal sterile fashion.  Local anesthesia was provided with 1% lidocaine. Using direct ultrasound guidance, 3 passes were made using 25 gauge needles into the nodule within the right lobe of the thyroid. Ultrasound was used to confirm needle placements on all occasions. Specimens were sent to Pathology for analysis.  Complications:  No immediate  Findings:  Imaging confirms needle placed in the dominant right thyroid mass  IMPRESSION: Ultrasound guided needle aspirate biopsy performed of the dominant right thyroid nodule.  ULTRASOUND GUIDED NEEDLE ASPIRATE BIOPSY OF THE THYROID GLAND  Comparison: 09/28/2012  Thyroid biopsy was thoroughly discussed with the patient and questions were answered.  The benefits, risks, alternatives, and complications were also discussed.  The patient understands and wishes to proceed with the procedure.  Written consent was obtained.  Ultrasound was performed to localize and mark an adequate site for the biopsy.  The patient was then prepped and draped in a normal sterile fashion.  Local anesthesia was provided with 1% lidocaine. Using direct  ultrasound guidance, 3 passes were made using 25 gauge needles into the nodule within the left lobe of the thyroid. Ultrasound was used to confirm needle placements on all occasions. Specimens were sent to Pathology for analysis.  Complications:  No immediate  Findings:  Imaging confirms needle placed in the dominant left thyroid mass  IMPRESSION: Ultrasound guided needle aspirate biopsy performed of the dominant left thyroid nodule.   Original Report Authenticated By: Judie Petit. Ruel Favors, M.D.     Scheduled Meds:    . docusate sodium  100 mg  Oral BID  . feeding supplement  1 Container Oral TID BM  . ketorolac  30 mg Intravenous Q6H   Or  . ketorolac  30 mg Intramuscular Q6H  . pantoprazole  40 mg Oral Q1200  . potassium chloride  40 mEq Oral Once  . sodium chloride      . sodium chloride      . sodium chloride      . DISCONTD: pantoprazole (PROTONIX) IV  40 mg Intravenous QHS  . DISCONTD: piperacillin-tazobactam (ZOSYN)  IV  3.375 g Intravenous Q8H   Continuous Infusions:    . dextrose 5% lactated ringers with KCl 20 mEq/L 75 mL/hr at 09/29/12 0700  . heparin 1,700 Units/hr (09/29/12 1610)  . DISCONTD: heparin 14 Units/kg/hr (09/29/12 0802)    Principal Problem:  *Dehydration Active Problems:  Fibroid uterus  Graves disease  Chronic blood loss anemia  Chronic kidney disease  Acute DVT (deep venous thrombosis)  Thyroid mass    Time spent: 25 mins    Weslyn Holsonback  [facility] Pager [phone]. If 7PM-7AM, please contact night-coverage at [email or web address], password [facility] 09/29/2012, 10:03 AM  LOS: 6 days     Addendum:  Patient was on propranolol prior to arrival for tachycardia due to hyperthyroidism.  We will have to hold off on this for now, since her blood pressure is already borderline low and she runs the risk of becoming hypotensive.  Propranolol can certainly be resumed in the future as her blood pressure further stabilizes.

## 2012-09-29 NOTE — Progress Notes (Signed)
Nutrition Follow-up  Intervention:  -d/c Resource Breeze per pt request - Add Ensure Complete BID between meals -Add MVI daily  Assessment:   Pt s/p hysterectomy and thyroid bx. She cont to have poor oral intake (0-50%)of meals and at time of visit her lunch tray is untouched. She reports poor tolerance of Resource Breeze therefore will d/c. Increased nutrient needs continue.   Diet Order: Regular  Meds: Scheduled Meds:   . docusate sodium  100 mg Oral BID  . feeding supplement  1 Container Oral TID BM  . pantoprazole  40 mg Oral Q1200  . sodium chloride      . sodium chloride      . sodium chloride      . DISCONTD: pantoprazole (PROTONIX) IV  40 mg Intravenous QHS  . DISCONTD: piperacillin-tazobactam (ZOSYN)  IV  3.375 g Intravenous Q8H   Continuous Infusions:   . dextrose 5% lactated ringers with KCl 20 mEq/L 75 mL/hr at 09/29/12 0800  . heparin 1,700 Units/hr (09/29/12 1000)  . DISCONTD: heparin 14 Units/kg/hr (09/29/12 0802)   PRN Meds:.ondansetron (ZOFRAN) IV, ondansetron, oxyCODONE-acetaminophen, zolpidem  Labs:  CMP     Component Value Date/Time   NA 136 09/29/2012 0434   K 3.4* 09/29/2012 0434   CL 104 09/29/2012 0434   CO2 25 09/29/2012 0434   GLUCOSE 87 09/29/2012 0434   BUN 16 09/29/2012 0434   CREATININE 1.35* 09/29/2012 0434   CALCIUM 8.5 09/29/2012 0434   PROT 6.8 09/28/2012 0439   ALBUMIN 1.5* 09/28/2012 0439   AST 8 09/28/2012 0439   ALT 6 09/28/2012 0439   ALKPHOS 78 09/28/2012 0439   BILITOT 0.7 09/28/2012 0439   GFRNONAA 47* 09/29/2012 0434   GFRAA 55* 09/29/2012 0434     Intake/Output Summary (Last 24 hours) at 09/29/12 1416 Last data filed at 09/29/12 1000  Gross per 24 hour  Intake 2252.14 ml  Output   1120 ml  Net 1132.14 ml    Weight Status: full assessment on 9/27 wt 92.9kg Wt Readings from Last 10 Encounters:  09/29/12 206 lb 12.7 oz (93.8 kg)  09/29/12 206 lb 12.7 oz (93.8 kg)  08/31/12 235 lb 0.2 oz (106.6 kg)  07/20/12 242 lb 15.2 oz  (110.2 kg)  06/12/12 242 lb 15.2 oz (110.2 kg)  04/24/10 270 lb (122.471 kg)  11/06/08 260 lb 12 oz (118.275 kg)    Estimated needs:  Kcal:1650-1914 kcal  Protein:90-100 gr  Fluid:1 ml/kcal  Nutrition Dx: Inadequate oral intake cont r/t increased nutrient needs, decreased appetite AEB meal intake 0-50%, wt loss hx .   Pt cont to meet criteria for severe malnutrition  Goal: Goal: Pt to meet >/= 90% of their estimated nutrition needs; not met  Monitor:  Diet tolerance, % meal , supplement intake and labs   Royann Shivers [facility] Office: [phone] Pager: [phone]

## 2012-09-29 NOTE — Progress Notes (Addendum)
ANTICOAGULATION CONSULT NOTE - Initial Consult  Pharmacy Consult for IV Heparin Indication: DVT  No Known Allergies  Patient Measurements: Height: 5\' 9"  (175.3 cm) Weight: 206 lb 12.7 oz (93.8 kg) IBW/kg (Calculated) : 66.2   Vital Signs: Temp: 97.2 F (36.2 C) (10/02 0400) Temp src: Oral (10/02 0400) BP: 110/72 mmHg (10/02 0800) Pulse Rate: 79  (10/02 0800)  Labs:  Basename 09/29/12 0434 09/28/12 2106 09/28/12 0439 09/27/12 0445  HGB 10.7* -- 10.5* --  HCT 33.4* -- 32.9* 31.2*  PLT 252 -- 238 274  APTT -- -- -- --  LABPROT -- -- -- --  INR -- -- -- --  HEPARINUNFRC 0.11* 0.10* -- --  CREATININE 1.35* -- 1.24* 1.03  CKTOTAL -- -- -- --  CKMB -- -- -- --  TROPONINI -- -- -- --    Estimated Creatinine Clearance: 65.5 ml/min (by C-G formula based on Cr of 1.35).  Medical History: Past Medical History  Diagnosis Date  . Thyroid disease   . Fibroid tumor    Medications:  Scheduled:     . docusate sodium  100 mg Oral BID  . feeding supplement  1 Container Oral TID BM  . ketorolac  30 mg Intravenous Q6H   Or  . ketorolac  30 mg Intramuscular Q6H  . pantoprazole  40 mg Oral Q1200  . potassium chloride  40 mEq Oral Once  . sodium chloride      . sodium chloride      . sodium chloride      . DISCONTD: pantoprazole (PROTONIX) IV  40 mg Intravenous QHS  . DISCONTD: piperacillin-tazobactam (ZOSYN)  IV  3.375 g Intravenous Q8H   Assessment: 41yo female with acute DVT.  Started on IV Heparin yesterday. Initial heparin level low. No bleeding noted.   Goal of Therapy:  Heparin level 0.3-0.7 units/ml Monitor platelets by anticoagulation protocol: Yes   Plan: Increase heparin 1700 units/hr Recheck 6hr heparin level Daily heparin level & CBC F/U plans for long-term anticoagulation Change Protonix to po  Daney Moor, [facility] 09/29/2012,9:17 AM

## 2012-09-29 NOTE — Progress Notes (Signed)
2 Days Post-Op Procedure(s) (LRB): HYSTERECTOMY SUPRACERVICAL ABDOMINAL (N/A), also day 1 s/p thyroid nodule bx x 2, DVT multilocation on heparin i.v.  Subjective: Patient reports incisional pain and + flatus.  Tolerating diet.   Objective: I have reviewed patient's vital signs, medications, labs and pathology. Fibroid uterus benign with abscess, Thyroid pending.  General: alert, cooperative and no distress GI: soft, non-tender; bowel sounds normal; no masses,  no organomegaly and incision: clean, dry and intact Extremities: edema stable   Assessment: s/p Procedure(s) (LRB) with comments: HYSTERECTOMY SUPRACERVICAL ABDOMINAL (N/A): stable and recovering nicely  Plan: Advance diet discontinue foley  LOS: 6 days    Lorry Anastasi V 09/29/2012, 7:53 AM

## 2012-09-29 NOTE — Progress Notes (Signed)
ANTICOAGULATION CONSULT NOTE  Pharmacy Consult for IV Heparin--> Coumadin Indication: DVT  No Known Allergies  Patient Measurements: Height: 5\' 9"  (175.3 cm) Weight: 206 lb 12.7 oz (93.8 kg) IBW/kg (Calculated) : 66.2   Vital Signs: Temp: 97.5 F (36.4 C) (10/02 1100) Temp src: Oral (10/02 1100) BP: 108/78 mmHg (10/02 1100) Pulse Rate: 73  (10/02 1100)  Labs:  Basename 09/29/12 1631 09/29/12 0434 09/28/12 2106 09/28/12 0439 09/27/12 0445  HGB -- 10.7* -- 10.5* --  HCT -- 33.4* -- 32.9* 31.2*  PLT -- 252 -- 238 274  APTT -- -- -- -- --  LABPROT -- -- -- -- --  INR -- -- -- -- --  HEPARINUNFRC 0.23* 0.11* 0.10* -- --  CREATININE -- 1.35* -- 1.24* 1.03  CKTOTAL -- -- -- -- --  CKMB -- -- -- -- --  TROPONINI -- -- -- -- --    Estimated Creatinine Clearance: 65.5 ml/min (by C-G formula based on Cr of 1.35).  Medical History: Past Medical History  Diagnosis Date  . Thyroid disease   . Fibroid tumor    Medications:  Scheduled:     . docusate sodium  100 mg Oral BID  . feeding supplement  237 mL Oral BID BM  . multivitamin  5 mL Oral Daily  . pantoprazole  40 mg Oral Q1200  . DISCONTD: feeding supplement  1 Container Oral TID BM  . DISCONTD: pantoprazole (PROTONIX) IV  40 mg Intravenous QHS  . DISCONTD: piperacillin-tazobactam (ZOSYN)  IV  3.375 g Intravenous Q8H   Assessment: 41yo female with acute DVT.  Started on IV Heparin yesterday. No bleeding noted.  Repeat heparin level still subtherapeutic.  Plan to initiate warfarin day.  Will need minimum 5 days of bridging or until INR>2 x24hrs.  Goal of Therapy:  Heparin level 0.3-0.7 units/ml Monitor platelets by anticoagulation protocol: Yes   Plan: Increase heparin 2000 units/hr Initiate Coumadin 10mg  po x1 tonight Daily heparin level & CBC & INR Initiate warfarin education  Elson Clan 09/29/2012,5:43 PM

## 2012-09-30 ENCOUNTER — Inpatient Hospital Stay (HOSPITAL_COMMUNITY): Payer: 59

## 2012-09-30 ENCOUNTER — Encounter (HOSPITAL_COMMUNITY): Payer: Self-pay | Admitting: Internal Medicine

## 2012-09-30 DIAGNOSIS — I82409 Acute embolism and thrombosis of unspecified deep veins of unspecified lower extremity: Secondary | ICD-10-CM

## 2012-09-30 HISTORY — DX: Acute embolism and thrombosis of unspecified deep veins of unspecified lower extremity: I82.409

## 2012-09-30 LAB — BASIC METABOLIC PANEL
CO2: 25 mEq/L (ref 19–32)
GFR calc non Af Amer: 62 mL/min — ABNORMAL LOW (ref >90)
Glucose, Bld: 85 mg/dL (ref 70–99)
Potassium: 3.3 mEq/L — ABNORMAL LOW (ref 3.5–5.1)
Sodium: 136 mEq/L (ref 135–145)

## 2012-09-30 LAB — T3, FREE: T3, Free: 3.5 pg/mL (ref 2.3–4.2)

## 2012-09-30 LAB — CBC
Hemoglobin: 10.6 g/dL — ABNORMAL LOW (ref 12.0–15.0)
MCH: 25.5 pg — ABNORMAL LOW (ref 26.0–34.0)
RBC: 4.16 MIL/uL (ref 3.87–5.11)
WBC: 7.7 10*3/uL (ref 4.0–10.5)

## 2012-09-30 LAB — PROTIME-INR: Prothrombin Time: 18.5 seconds — ABNORMAL HIGH (ref 11.6–15.2)

## 2012-09-30 LAB — HEPARIN LEVEL (UNFRACTIONATED): Heparin Unfractionated: 0.45 IU/mL (ref 0.30–0.70)

## 2012-09-30 LAB — T4, FREE: Free T4: 1.57 ng/dL (ref 0.80–1.80)

## 2012-09-30 LAB — TSH: TSH: 1.853 u[IU]/mL (ref 0.350–4.500)

## 2012-09-30 MED ORDER — POTASSIUM CHLORIDE CRYS ER 20 MEQ PO TBCR
40.0000 meq | EXTENDED_RELEASE_TABLET | Freq: Once | ORAL | Status: AC
  Administered 2012-09-30: 40 meq via ORAL
  Filled 2012-09-30: qty 2

## 2012-09-30 MED ORDER — IOHEXOL 300 MG/ML  SOLN
80.0000 mL | Freq: Once | INTRAMUSCULAR | Status: AC | PRN
  Administered 2012-09-30: 80 mL via INTRAVENOUS

## 2012-09-30 MED ORDER — PROPRANOLOL HCL 20 MG PO TABS
20.0000 mg | ORAL_TABLET | Freq: Two times a day (BID) | ORAL | Status: DC
  Administered 2012-09-30 – 2012-10-07 (×14): 20 mg via ORAL
  Filled 2012-09-30 (×14): qty 1

## 2012-09-30 MED ORDER — WARFARIN SODIUM 5 MG PO TABS
5.0000 mg | ORAL_TABLET | Freq: Once | ORAL | Status: AC
  Administered 2012-09-30: 5 mg via ORAL
  Filled 2012-09-30: qty 1

## 2012-09-30 NOTE — Clinical Social Work Note (Signed)
Talked w patient about in-home support possibilities.  Patient aware that she will need help for approx 2 weeks post discharge, preferably w help in AM, noon and PM.  Patient believes she can be taken to neighbor's house to stay during the day.  Expresses concern about 20 stairs required to enter/exit patient's home, states she will get husband or another neighbor to help her.  Patient asked to contact neighbor, pastor, husband and develop support network for post discharge.  CSW attempted to contact husband and neighbor but was unsuccessful.  Santa Genera, LCSW Clinical Social Worker [phone])

## 2012-09-30 NOTE — Evaluation (Signed)
Physical Therapy Evaluation Patient Details Name: [name] MRN: [id number] DOB: 1971/12/11 Today's Date: 09/30/2012 Time: 5284-1324 PT Time Calculation (min): 43 min  PT Assessment / Plan / Recommendation Clinical Impression  Pt is found to be moderately deconditioned due to recent illness and surgery.  She needed mod assist to transfer from supine to sit and now needs a walker to facilitate gait.  She has 20 steps to enter her apartment.  I am recommending HHPT at d/c and pt is in agreement.    PT Assessment  Patient needs continued PT services    Follow Up Recommendations  Home health PT    Barriers to Discharge Inaccessible home environment;Decreased caregiver support she does have church family who can assist when husband is not at home    Equipment Recommendations  3 in 1 bedside comode    Recommendations for Other Services OT consult   Frequency Min 3X/week    Precautions / Restrictions Precautions Precautions: Fall Precaution Comments: pt has a drain in abdominal incision Restrictions Weight Bearing Restrictions: No   Pertinent Vitals/Pain       Mobility  Bed Mobility Bed Mobility: Left Sidelying to Sit;Sitting - Scoot to Edge of Bed Left Sidelying to Sit: 3: Mod assist;HOB flat (transfer is very labored and uncomfortable) Sitting - Scoot to Edge of Bed: 5: Supervision Details for Bed Mobility Assistance: because of recent abdominal surgery overlaying generalized deconditioning, trasfer is very difficult Transfers Transfers: Sit to Stand;Stand to Sit Sit to Stand: 5: Supervision;With upper extremity assist;From bed Stand to Sit: 5: Supervision;To chair/3-in-1;With upper extremity assist Ambulation/Gait Ambulation/Gait Assistance: 4: Min assist Ambulation Distance (Feet): 150 Feet Assistive device: Rolling walker Ambulation/Gait Assistance Details: pt normally ambulates with no assistive device.  She is able to ambulate this way, but gait is extremely  slow and antalgic due to abdominal discomfort.  She was instructed in use of a walker to make gait more functional. Gait Pattern: Trunk flexed;Shuffle Gait velocity: WNL with use of walker Stairs: No Wheelchair Mobility Wheelchair Mobility: No    Shoulder Instructions     Exercises General Exercises - Lower Extremity Ankle Circles/Pumps: AROM;Strengthening;Both;10 reps;Supine Heel Slides: AROM;Both;10 reps;Supine Hip ABduction/ADduction: AROM;Both;10 reps;Supine   PT Diagnosis: Difficulty walking;Generalized weakness;Acute pain  PT Problem List: Decreased strength;Decreased activity tolerance;Decreased mobility;Decreased knowledge of use of DME;Pain PT Treatment Interventions: Gait training;Stair training;Functional mobility training;Therapeutic activities;Therapeutic exercise;Patient/family education   PT Goals Acute Rehab PT Goals PT Goal Formulation: With patient Time For Goal Achievement: 10/14/12 Potential to Achieve Goals: Good Pt will go Supine/Side to Sit: with min assist;with HOB 0 degrees PT Goal: Supine/Side to Sit - Progress: Goal set today Pt will go Sit to Supine/Side: with min assist;with HOB 0 degrees PT Goal: Sit to Supine/Side - Progress: Goal set today Pt will go Sit to Stand: with modified independence;with upper extremity assist PT Goal: Sit to Stand - Progress: Goal set today Pt will go Stand to Sit: with modified independence;with upper extremity assist PT Goal: Stand to Sit - Progress: Goal set today Pt will Ambulate: >150 feet;with least restrictive assistive device PT Goal: Ambulate - Progress: Goal set today Pt will Go Up / Down Stairs: Flight;with rail(s);with supervision PT Goal: Up/Down Stairs - Progress: Goal set today  Visit Information  Last PT Received On: 09/30/12    Subjective Data  Subjective: I got up to walk to the bathroom with the nurse last night Patient Stated Goal: none stated   Prior Functioning  Home Living Lives With:  Spouse Available Help at Discharge: Available PRN/intermittently;Family (husband works 2nd shift) Type of Home: Apartment Home Access: Stairs to enter Secretary/administrator of Steps: 20 Entrance Stairs-Rails: Right Home Layout: One level Bathroom Shower/Tub: Engineer, manufacturing systems: Standard Home Adaptive Equipment: Environmental consultant - rolling;Straight cane Prior Function Level of Independence: Independent Able to Take Stairs?: Yes Driving: Yes Vocation: Unemployed Communication Communication: No difficulties    Cognition  Overall Cognitive Status: Appears within functional limits for tasks assessed/performed Arousal/Alertness: Awake/alert Orientation Level: Appears intact for tasks assessed Behavior During Session: [facility] for tasks performed    Extremity/Trunk Assessment Right Lower Extremity Assessment RLE ROM/Strength/Tone: [facility] for tasks assessed (general strength 3+/5) RLE Sensation: WFL - Light Touch RLE Coordination: WFL - gross motor Left Lower Extremity Assessment LLE ROM/Strength/Tone: WFL for tasks assessed (general strength 3+/5) LLE Sensation: WFL - Light Touch LLE Coordination: WFL - gross motor   Balance Balance Balance Assessed: No  End of Session PT - End of Session Equipment Utilized During Treatment: Gait belt Activity Tolerance: Patient tolerated treatment well Patient left: in chair;with call bell/phone within reach;with nursing in room Nurse Communication: Mobility status  GP     Konrad Penta 09/30/2012, 9:33 AM

## 2012-09-30 NOTE — Progress Notes (Signed)
[facility] PROGRESS NOTE  [name] OZH:[id number] DOB: 1971/07/25 DOA: 09/23/2012 PCP: Tilda Burrow, MD  Assessment/Plan: Principal Problem:  *Dehydration Active Problems:  Fibroid uterus  Graves disease  Chronic blood loss anemia  Chronic kidney disease  Acute DVT (deep venous thrombosis)  Thyroid mass  1. Necrotic uterine fibroid.  Patient is s/p hysterectomy. Plans to remove JP drain today noted.  Antibiotic coverage was provided with zosyn, which has since been discontinued. 2. Dehydration. resolved. 3. Acute renal failure secondary to dehydration. resolved 4. Anemia due to chronic blood loss from uterine fibroids. Patient has been transfused a total of 4 units of packed red blood cells during this admission.  Hemoglobin is stable and she has not had signs of recurrent bleeding. 5. Fever likely due to #1. Resolved. 6. Graves' disease. Patient has had poor followup in the outpatient setting for thyroid uptake scan. Endocrinology is following. She will need further work up for this once she is discharged. Will restart propranolol. 7. Severe protein calorie malnutrition. Nutrition consulted. Advance diet as tolerated.  8. Bilateral upper extremity and LLE DVT. Patient is on a heparin infusion and has been started on coumadin.  Hypercoagulable panel was sent, please see discussion below.  Due to multiple social issues, she will need to be therapeutic on coumadin prior to discharge. She already had a CT abd/pelvis.  We will check CT chest to rule out any underlying malignancy.  9. Thyroid mass.  Patient underwent thyroid biopsy.  Will follow up results 10. Abnormal hypercoagulable Panel.  Discussed with hematology.  Many of the abnormal results can be related to acute phase reactions.  Her hypercoagulable panel will need to be repeated in the future, when her thromboembolisms have been treated and she is off anticoagulation.  She will need to have follow up with hematology in  the outpatient setting.  Treatment will be unchanged for now.  We will continue with anticoagulation for a planned 6 month period. 11. Deconditioning to due acute illness.  She will need home health physical therapy.  Code Status: Full code Family Communication: discussed with patient Disposition Plan: discharge home when medically stable   Brief narrative: This lady was admitted to the hospital with vomiting, dehydration and generalized weakness.  She was found to be severely anemic which was felt to be secondary to her chronic blood loss due to uterine fibroids.  During her hospital stay she became hypotensive, febrile and was showing signs of sepsis.  She was transferred to the ICU for closer monitoring. CT of the abd/pelvis revealed a necrotic uterine fibroid.  Patient was stabilized and taken for hysterectomy by Dr. Emelda Fear.  She was placed on broad spectrum antibiotics and has done fairly well since then.  Unfortunately, she was found to have multiple DVTs in her upper and lower extremities.  She has been started on anticoagulation.  Consultants:  Gynecology, Dr. Emelda Fear  Endocrinology, Dr. Fransico Him  Procedures:  Hysterectomy 9/30 by Dr. Emelda Fear  Antibiotics:  Zosyn discontinued 10/2  HPI/Subjective: No new complaints, enjoyed moving around, feeling like she's improving.  Objective: Filed Vitals:   09/29/12 1400 09/29/12 2121 09/30/12 0557 09/30/12 0652  BP: 122/84 121/84 123/77   Pulse: 74 73 76   Temp: 97.6 F (36.4 C) 97 F (36.1 C) 97.5 F (36.4 C)   TempSrc:  Oral Oral   Resp: 18 18 18    Height:      Weight:    96.843 kg (213 lb 8 oz)  SpO2: 99% 98%  98%     Intake/Output Summary (Last 24 hours) at 09/30/12 1014 Last data filed at 09/30/12 0400  Gross per 24 hour  Intake    440 ml  Output    400 ml  Net     40 ml   Filed Weights   09/28/12 0500 09/29/12 0500 09/30/12 0652  Weight: 93.8 kg (206 lb 12.7 oz) 93.8 kg (206 lb 12.7 oz) 96.843 kg (213 lb 8 oz)      Exam:   General:  NAD  Cardiovascular: s1, s2, rrr  Respiratory: cta b  Abdomen: soft, distended, bs+  EXT: left UE 2+ edema, RUE 1+ edema, 1-2+ edema in the LE b/l  Data Reviewed: Basic Metabolic Panel:  Lab 09/30/12 4098 09/29/12 0434 09/28/12 0439 09/27/12 0445 09/26/12 0513  NA 136 136 134* 134* 130*  K 3.3* 3.4* 3.2* 3.4* 3.2*  CL 105 104 102 102 98  CO2 25 25 25 23 22   GLUCOSE 85 87 104* 94 104*  BUN 11 16 15 13 15   CREATININE 1.08 1.35* 1.24* 1.03 1.09  CALCIUM 8.7 8.5 8.5 8.5 8.6  MG -- -- -- -- --  PHOS -- -- -- -- --   Liver Function Tests:  Lab 09/28/12 0439 09/27/12 0445 09/25/12 0508 09/24/12 0428 09/23/12 1415  AST 8 9 17 20  33  ALT 6 9 17 19 26   ALKPHOS 78 95 129* 108 135*  BILITOT 0.7 0.9 1.0 1.1 1.6*  PROT 6.8 7.5 8.1 7.2 8.2  ALBUMIN 1.5* 1.8* 1.6* 1.5* 1.8*   No results found for this basename: LIPASE:5,AMYLASE:5 in the last 168 hours No results found for this basename: AMMONIA:5 in the last 168 hours CBC:  Lab 09/30/12 0455 09/29/12 0434 09/28/12 0439 09/27/12 0445 09/26/12 0513  WBC 7.7 8.1 7.4 7.9 11.4*  NEUTROABS -- -- -- -- --  HGB 10.6* 10.7* 10.5* 10.0* 10.6*  HCT 33.7* 33.4* 32.9* 31.2* 33.3*  MCV 81.0 80.7 80.2 79.0 77.4*  PLT 289 252 238 274 314   Cardiac Enzymes: No results found for this basename: CKTOTAL:5,CKMB:5,CKMBINDEX:5,TROPONINI:5 in the last 168 hours BNP (last 3 results)  Basename 08/28/12 2145  PROBNP 617.9*   CBG:  Lab 09/23/12 1709  GLUCAP 90    Recent Results (from the past 240 hour(s))  URINE CULTURE     Status: Normal   Collection Time   09/23/12  2:25 PM      Component Value Range Status Comment   Specimen Description URINE, CLEAN CATCH   Final    Special Requests NONE   Final    Culture  Setup Time 09/24/2012 01:05   Final    Colony Count NO GROWTH   Final    Culture NO GROWTH   Final    Report Status 09/24/2012 FINAL   Final   CULTURE, BLOOD (ROUTINE X 2)     Status: Normal   Collection  Time   09/23/12  8:39 PM      Component Value Range Status Comment   Specimen Description BLOOD LEFT HAND   Final    Special Requests BOTTLES DRAWN AEROBIC AND ANAEROBIC 6CC   Final    Culture NO GROWTH 5 DAYS   Final    Report Status 09/28/2012 FINAL   Final   CULTURE, BLOOD (ROUTINE X 2)     Status: Normal   Collection Time   09/23/12  8:41 PM      Component Value Range Status Comment   Specimen Description BLOOD LEFT  ANTECUBITAL   Final    Special Requests BOTTLES DRAWN AEROBIC AND ANAEROBIC 6CC   Final    Culture NO GROWTH 5 DAYS   Final    Report Status 09/28/2012 FINAL   Final   CULTURE, ROUTINE-GENITAL     Status: Normal   Collection Time   09/23/12 11:50 PM      Component Value Range Status Comment   Specimen Description VAGINA   Final    Special Requests DISCHARGE   Final    Culture NORMAL VAGINAL FLORA   Final    Report Status 09/27/2012 FINAL   Final   MRSA PCR SCREENING     Status: Normal   Collection Time   09/24/12  2:44 AM      Component Value Range Status Comment   MRSA by PCR NEGATIVE  NEGATIVE Final   WET PREP, GENITAL     Status: Abnormal   Collection Time   09/24/12  2:35 PM      Component Value Range Status Comment   Yeast Wet Prep HPF POC NONE SEEN  NONE SEEN Final    Trich, Wet Prep NONE SEEN  NONE SEEN Final    Clue Cells Wet Prep HPF POC RARE (*) NONE SEEN Final    WBC, Wet Prep HPF POC TOO NUMEROUS TO COUNT (*) NONE SEEN Final      Studies: US Soft Tissue Head/neck  09/28/2012  *RADIOLOGY REPORT*  Clinical Data: Thyroid nodule  THYROID ULTRASOUND  Technique: Ultrasound examination of the thyroid gland and adjacent soft tissues was performed.  Comparison:  None.  Findings:  Right thyroid lobe:  23 x 33 x 15 mm, inhomogeneous echotexture Left thyroid lobe:  29 x 31 x 55 mm Isthmus:  1.2 cm in thickness  Focal nodules:  18 x 20 x 31 mm solid, mid-right 25 x 29 x 50 mm solid, mid-left There are additional much smaller nodules bilaterally.  Lymphadenopathy:   None visualized.  IMPRESSION:  Bilateral thyroid nodules.  The dominant left and right lesions meet consensus criteria for biopsy.  Ultrasound-guided fine needle aspiration should be considered, as per the consensus statement: Management of Thyroid Nodules Detected at [place]:  Society of Radiologists in Ultrasound Consensus Conference Statement. Radiology 2005; [id number].   Original Report Authenticated By: Osa Craver, M.D.    US Guided Needle Placement  09/28/2012  *RADIOLOGY REPORT*  Clinical Data:  Dominant bilateral thyroid nodules multinodular thyroid gland  ULTRASOUND GUIDED NEEDLE ASPIRATE BIOPSY OF THE THYROID GLAND  Comparison: 09/28/2012  Thyroid biopsy was thoroughly discussed with the patient and questions were answered.  The benefits, risks, alternatives, and complications were also discussed.  The patient understands and wishes to proceed with the procedure.  Written consent was obtained.  Ultrasound was performed to localize and mark an adequate site for the biopsy.  The patient was then prepped and draped in a normal sterile fashion.  Local anesthesia was provided with 1% lidocaine. Using direct ultrasound guidance, 3 passes were made using 25 gauge needles into the nodule within the right lobe of the thyroid. Ultrasound was used to confirm needle placements on all occasions. Specimens were sent to Pathology for analysis.  Complications:  No immediate  Findings:  Imaging confirms needle placed in the dominant right thyroid mass  IMPRESSION: Ultrasound guided needle aspirate biopsy performed of the dominant right thyroid nodule.  ULTRASOUND GUIDED NEEDLE ASPIRATE BIOPSY OF THE THYROID GLAND  Comparison: 09/28/2012  Thyroid biopsy was thoroughly discussed with the patient  and questions were answered.  The benefits, risks, alternatives, and complications were also discussed.  The patient understands and wishes to proceed with the procedure.  Written consent was obtained.  Ultrasound was  performed to localize and mark an adequate site for the biopsy.  The patient was then prepped and draped in a normal sterile fashion.  Local anesthesia was provided with 1% lidocaine. Using direct ultrasound guidance, 3 passes were made using 25 gauge needles into the nodule within the left lobe of the thyroid. Ultrasound was used to confirm needle placements on all occasions. Specimens were sent to Pathology for analysis.  Complications:  No immediate  Findings:  Imaging confirms needle placed in the dominant left thyroid mass  IMPRESSION: Ultrasound guided needle aspirate biopsy performed of the dominant left thyroid nodule.   Original Report Authenticated By: Judie Petit. Ruel Favors, M.D.    US Venous Img Lower Bilateral  09/28/2012  *RADIOLOGY REPORT*  Clinical Data: Pain, swelling, history DVT.  BILATERAL LOWER EXTREMITY VENOUS DOPPLER ULTRASOUND  Technique: Gray-scale sonography with compression, as well as color and duplex ultrasound, were performed to evaluate the deep venous system from the level of the common femoral vein through the popliteal and proximal calf veins.  Comparison: 08/29/2012  Findings: On the right, normal compressibility of  the common femoral, superficial femoral, and popliteal veins, as well as the proximal calf veins.  No filling defects to suggest DVT on grayscale or color Doppler imaging.  Doppler waveforms show normal direction of venous flow, normal respiratory phasicity and response to augmentation.  On the left, there is partially occlusive noncompressible thrombus in the popliteal vein.  The femoral vein, profunda femoris, and common femoral vein as well as the saphenofemoral junction remain widely patent without central extension of thrombus.  IMPRESSION: 1.  Left popliteal DVT. 2.  No evidence of right lower extremity DVT.   Original Report Authenticated By: Osa Craver, M.D.    US Venous Img Upper Uni Right  09/28/2012  *RADIOLOGY REPORT*  Clinical Data: PICC line in  place.  Arm swelling.  Pain.  Edema.  RIGHT UPPER EXTREMITY VENOUS DUPLEX ULTRASOUND  Technique:  Gray-scale sonography with graded compression, as well as color Doppler and duplex ultrasound were performed to evaluate the upper extremity deep venous system from the level of the subclavian vein and including the jugular, axillary, basilic and upper cephalic vein.  Spectral Doppler was utilized to evaluate flow at rest and with distal augmentation maneuvers.  Comparison:  None.  Findings: Normal color signal and wave forms in the visualized portions of right internal jugular vein.  There is diffuse partially occlusive thrombus along the left arm PICC line extending through visualized portions of right brachial and axillary veins.  There is also partially occlusive thrombus noted in the right basilic and radial veins and partial thrombosis in the right cephalic vein below the elbow. Ulnar vein is unremarkable in its visualized segments.  Subcutaneous edema is evident in the forearm.  IMPRESSION:  1.  Right upper extremity DVT as above.   Original Report Authenticated By: Osa Craver, M.D.    US Thyroid Biopsy  09/28/2012  *RADIOLOGY REPORT*  Clinical Data:  Dominant bilateral thyroid nodules multinodular thyroid gland  ULTRASOUND GUIDED NEEDLE ASPIRATE BIOPSY OF THE THYROID GLAND  Comparison: 09/28/2012  Thyroid biopsy was thoroughly discussed with the patient and questions were answered.  The benefits, risks, alternatives, and complications were also discussed.  The patient understands and wishes to proceed with  the procedure.  Written consent was obtained.  Ultrasound was performed to localize and mark an adequate site for the biopsy.  The patient was then prepped and draped in a normal sterile fashion.  Local anesthesia was provided with 1% lidocaine. Using direct ultrasound guidance, 3 passes were made using 25 gauge needles into the nodule within the right lobe of the thyroid. Ultrasound was used to  confirm needle placements on all occasions. Specimens were sent to Pathology for analysis.  Complications:  No immediate  Findings:  Imaging confirms needle placed in the dominant right thyroid mass  IMPRESSION: Ultrasound guided needle aspirate biopsy performed of the dominant right thyroid nodule.  ULTRASOUND GUIDED NEEDLE ASPIRATE BIOPSY OF THE THYROID GLAND  Comparison: 09/28/2012  Thyroid biopsy was thoroughly discussed with the patient and questions were answered.  The benefits, risks, alternatives, and complications were also discussed.  The patient understands and wishes to proceed with the procedure.  Written consent was obtained.  Ultrasound was performed to localize and mark an adequate site for the biopsy.  The patient was then prepped and draped in a normal sterile fashion.  Local anesthesia was provided with 1% lidocaine. Using direct ultrasound guidance, 3 passes were made using 25 gauge needles into the nodule within the left lobe of the thyroid. Ultrasound was used to confirm needle placements on all occasions. Specimens were sent to Pathology for analysis.  Complications:  No immediate  Findings:  Imaging confirms needle placed in the dominant left thyroid mass  IMPRESSION: Ultrasound guided needle aspirate biopsy performed of the dominant left thyroid nodule.   Original Report Authenticated By: Judie Petit. Ruel Favors, M.D.    US Thyroid Biopsy  09/28/2012  *RADIOLOGY REPORT*  Clinical Data:  Dominant bilateral thyroid nodules multinodular thyroid gland  ULTRASOUND GUIDED NEEDLE ASPIRATE BIOPSY OF THE THYROID GLAND  Comparison: 09/28/2012  Thyroid biopsy was thoroughly discussed with the patient and questions were answered.  The benefits, risks, alternatives, and complications were also discussed.  The patient understands and wishes to proceed with the procedure.  Written consent was obtained.  Ultrasound was performed to localize and mark an adequate site for the biopsy.  The patient was then prepped  and draped in a normal sterile fashion.  Local anesthesia was provided with 1% lidocaine. Using direct ultrasound guidance, 3 passes were made using 25 gauge needles into the nodule within the right lobe of the thyroid. Ultrasound was used to confirm needle placements on all occasions. Specimens were sent to Pathology for analysis.  Complications:  No immediate  Findings:  Imaging confirms needle placed in the dominant right thyroid mass  IMPRESSION: Ultrasound guided needle aspirate biopsy performed of the dominant right thyroid nodule.  ULTRASOUND GUIDED NEEDLE ASPIRATE BIOPSY OF THE THYROID GLAND  Comparison: 09/28/2012  Thyroid biopsy was thoroughly discussed with the patient and questions were answered.  The benefits, risks, alternatives, and complications were also discussed.  The patient understands and wishes to proceed with the procedure.  Written consent was obtained.  Ultrasound was performed to localize and mark an adequate site for the biopsy.  The patient was then prepped and draped in a normal sterile fashion.  Local anesthesia was provided with 1% lidocaine. Using direct ultrasound guidance, 3 passes were made using 25 gauge needles into the nodule within the left lobe of the thyroid. Ultrasound was used to confirm needle placements on all occasions. Specimens were sent to Pathology for analysis.  Complications:  No immediate  Findings:  Imaging confirms needle  placed in the dominant left thyroid mass  IMPRESSION: Ultrasound guided needle aspirate biopsy performed of the dominant left thyroid nodule.   Original Report Authenticated By: Judie Petit. Ruel Favors, M.D.     Scheduled Meds:    . coumadin book   Does not apply Once  . docusate sodium  100 mg Oral BID  . feeding supplement  237 mL Oral BID BM  . multivitamin  5 mL Oral Daily  . pantoprazole  40 mg Oral Q1200  . warfarin  10 mg Oral ONCE-1800  . warfarin   Does not apply Once  . Warfarin - Pharmacist Dosing Inpatient   Does not apply  q1800  . DISCONTD: feeding supplement  1 Container Oral TID BM   Continuous Infusions:    . dextrose 5% lactated ringers with KCl 20 mEq/L 75 mL/hr at 09/30/12 0408  . heparin 2,000 Units/hr (09/29/12 2331)    Principal Problem:  *Dehydration Active Problems:  Fibroid uterus  Graves disease  Chronic blood loss anemia  Chronic kidney disease  Acute DVT (deep venous thrombosis)  Thyroid mass    Time spent: 25 mins    Tashianna Broome  [facility] Pager [phone]. If 7PM-7AM, please contact night-coverage at [email or web address], password [facility] 09/30/2012, 10:14 AM  LOS: 7 days

## 2012-09-30 NOTE — Progress Notes (Signed)
3 Days Post-Op Procedure(s) (LRB): HYSTERECTOMY SUPRACERVICAL ABDOMINAL (N/A)  Subjective: Patient reports tolerating PO and + flatus.  ON coumadin , and Heparin levels therapeutic.  Pathology of thyroid nodule pending  Objective: I have reviewed patient's vital signs, medications, labs and pathology. Blood pressure 123/77, pulse 76, temperature 97.5 F (36.4 C), temperature source Oral, resp. rate 18, height 5\' 9"  (1.753 m), weight 96.843 kg (213 lb 8 oz), last menstrual period 09/18/2012, SpO2 98.00%. General: alert, flushed, no distress and able to walk in hallway with walker. will need walker at discharge. GI: soft, non-tender; bowel sounds normal; no masses,  no organomegaly and JP drain minimal productivity, will remove today. Extremities: edema stable. hgb 10.3, Cr 1.08,  Assessment: s/p Procedure(s) (LRB) with comments: HYSTERECTOMY SUPRACERVICAL ABDOMINAL (N/A): stable, progressing well and tolerating diet  Plan: 1. will remove abd JP drain today. 2. Will need walker at home upon discharge. 3.Await thyroid pathology. 4.  Will need to be stable on anticoagulants before discharge. 5. Will ask social services to assess her ability to keep  followup appts., and care for self. May need home care arrangements.  [facility] contacted to assess home care.  LOS: 7 days    Chelcie Estorga V 09/30/2012, 9:06 AM

## 2012-09-30 NOTE — Progress Notes (Signed)
[name], [name]              ACCOUNT NO.:  [id number]  MEDICAL RECORD NO.:  [id number]  LOCATION:  A308                          FACILITY:  [facility]  PHYSICIAN:  Purcell Nails, MD DATE OF BIRTH:  Mar 24, 1971  DATE OF PROCEDURE: DATE OF DISCHARGE:                                PROGRESS NOTE   REASON FOR FOLLOW UP:  Hyperthyroidism.  SUBJECTIVE:  The patient is on postop day #3 after hysterectomy.  She is feeling better.  She had thyroid fine-needle aspiration biopsy yesterday, awaiting results.  OBJECTIVE:  GENERAL:  She is alert and oriented x3.  She is outside of ICU, on regular floor.  She is eating very well. VITAL SIGNS:  Blood pressure is 129/85, pulse rate 67, temperature 97.4. HEENT:  Well-hydrated. NECK:  Significant for biopsy trucks. CHEST:  Clear to auscultation bilaterally. CARDIOVASCULAR:  Normal S1 and S2.  No murmur.  No gallop. ABDOMEN:  Dressed abdominal wound. EXTREMITIES:  Resolving edema. CNS:  Nonfocal.  LABORATORY DATA:  Her repeat TFTs show TSH of 1.85, normal; free T4 1.57, high normal; free T3 3.5, normal.  Her thyroid FNA biopsy is pending.  Her surgical sample showed leiomyoma and endometrial acute abscess with no evidence of endometriosis or atypical cytologies.  ASSESSMENT: 1. Hyperthyroidism, controlled.  The patient is awaiting thyroid     uptake and scan as an outpatient.  Most likely to be followed by     thyroid ablation with radioactive iodine. 2. Nodular goiter, status post biopsy, awaiting cytology results. 3. Status post hysterectomy for hypoxic fibroid. 4. Acute deep venous thrombosis, on anticoagulation. 5. Chronic kidney disease. 6. Chronic blood loss anemia.  PLAN:  I will follow the results of her thyroid FNA which will determine her further options of therapy.  If cytology is suspicious, she may need surgery.  If cytology is benign, we will plan to do thyroid uptake and scan followed by radioactive iodine thyroid  ablation.  Her thyroid function tests are acceptable and stable for now.  No antithyroid medication intervention at this point.          ______________________________ Purcell Nails, MD     GN/MEDQ  D:  09/30/2012  T:  09/30/2012  Job:  [id number]

## 2012-09-30 NOTE — Progress Notes (Signed)
ANTICOAGULATION CONSULT NOTE  Pharmacy Consult for IV Heparin--> Coumadin Indication: DVT  No Known Allergies  Patient Measurements: Height: 5\' 9"  (175.3 cm) Weight: 213 lb 8 oz (96.843 kg) IBW/kg (Calculated) : 66.2   Vital Signs: Temp: 97.5 F (36.4 C) (10/03 0557) Temp src: Oral (10/03 0557) BP: 123/77 mmHg (10/03 0557) Pulse Rate: 76  (10/03 0557)  Labs:  Basename 09/30/12 0455 09/29/12 1631 09/29/12 0434 09/28/12 0439  HGB 10.6* -- 10.7* --  HCT 33.7* -- 33.4* 32.9*  PLT 289 -- 252 238  APTT -- -- -- --  LABPROT 18.5* -- -- --  INR 1.59* -- -- --  HEPARINUNFRC 0.45 0.23* 0.11* --  CREATININE 1.08 -- 1.35* 1.24*  CKTOTAL -- -- -- --  CKMB -- -- -- --  TROPONINI -- -- -- --   Estimated Creatinine Clearance: 83.1 ml/min (by C-G formula based on Cr of 1.08).  Medical History: Past Medical History  Diagnosis Date  . Thyroid disease   . Fibroid tumor    Medications:  Scheduled:     . coumadin book   Does not apply Once  . docusate sodium  100 mg Oral BID  . feeding supplement  237 mL Oral BID BM  . multivitamin  5 mL Oral Daily  . pantoprazole  40 mg Oral Q1200  . warfarin  10 mg Oral ONCE-1800  . warfarin   Does not apply Once  . Warfarin - Pharmacist Dosing Inpatient   Does not apply q1800  . DISCONTD: feeding supplement  1 Container Oral TID BM   Assessment: 41yo female with acute DVT.  Started on IV Heparin yesterday. No bleeding noted. Heparin level therapeutic today Warfarin initiated yesterday.  Will need minimum 5 days of bridging or until INR>2 x24hrs.  Goal of Therapy:  Heparin level 0.3-0.7 units/ml Monitor platelets by anticoagulation protocol: Yes   Plan: Continue heparin at current rate (2000 units/hr) Coumadin 5mg  po x1 tonight Daily heparin level & CBC & INR Initiate warfarin education  [place], Brayli Klingbeil A 09/30/2012,10:25 AM

## 2012-09-30 NOTE — Progress Notes (Signed)
UR Chart Review Completed  

## 2012-09-30 NOTE — Progress Notes (Signed)
Patient ID: [name], female   DOB: [birth year], 41 y.o.   MRN: [id number]

## 2012-09-30 NOTE — Evaluation (Signed)
Occupational Therapy Evaluation Patient Details Name: [name] MRN: [id number] DOB: 1971/05/02 Today's Date: 09/30/2012 Time: 7829-5621 OT Time Calculation (min): 20 min  OT Assessment / Plan / Recommendation Clinical Impression  Patient is a 41 y/o female s/p Dehydration presenting to acute OT with all education complete. Recommended patient order tub transfer bench and 3-in-1 commode. patient was educated on energy conservation technques and Bil UE HEP. Recommend Home health OT at D/C.    OT Assessment  All further OT needs can be met in the next venue of care    Follow Up Recommendations  Home health OT       Equipment Recommendations  3 in 1 bedside comode;Tub/shower bench          Precautions / Restrictions Precautions Precautions: Fall Precaution Comments: pt has a drain in abdominal incision    Pertinent Vitals/Pain No report of pain.    ADL  Lower Body Dressing: Performed;Supervision/safety Where Assessed - Lower Body Dressing: Unsupported sit to stand Toilet Transfer: Performed;Supervision/safety Toilet Transfer Method: Stand pivot Acupuncturist: Other (comment) (from w/c to bed) Equipment Used: Wheelchair Transfers/Ambulation Related to ADLs: Patient transfers at a Supervision level.     OT Diagnosis: Generalized weakness  OT Problem List: Decreased strength;Decreased activity tolerance;Impaired balance (sitting and/or standing);Decreased coordination;Impaired UE functional use     Visit Information  Last OT Received On: 09/30/12 Assistance Needed: +1    Subjective Data  Subjective: "I had a lot of pain this morning, but none now." Patient Stated Goal: To go home.   Prior Functioning     Home Living Lives With: Spouse Available Help at Discharge: Available PRN/intermittently;Family;Other (Comment) (husband works 2nd shift) Type of Home: Apartment Home Access: Stairs to enter Secretary/administrator of Steps: 20 Entrance  Stairs-Rails: Right Home Layout: One level Bathroom Shower/Tub: Engineer, manufacturing systems: Standard Home Adaptive Equipment: Environmental consultant - rolling;Straight cane Prior Function Level of Independence: Independent Able to Take Stairs?: Yes Driving: Yes Vocation: Unemployed Communication Communication: No difficulties Dominant Hand: Right            Cognition  Overall Cognitive Status: Appears within functional limits for tasks assessed/performed Arousal/Alertness: Awake/alert Orientation Level: Appears intact for tasks assessed Behavior During Session: [facility] for tasks performed    Extremity/Trunk Assessment Right Upper Extremity Assessment RUE ROM/Strength/Tone: Deficits RUE ROM/Strength/Tone Deficits: MMT: 3+/5; A/ROM WFL in all ranges. RUE Sensation: WFL - Light Touch;WFL - Proprioception RUE Coordination: Deficits RUE Coordination Deficits: impaired gross/fine motor  Left Upper Extremity Assessment LUE ROM/Strength/Tone: Deficits LUE ROM/Strength/Tone Deficits: MMT: 3+/5; A/ROM WFL in all ranges. LUE Sensation: WFL - Light Touch;WFL - Proprioception LUE Coordination: Deficits LUE Coordination Deficits: Impaired gross/fine motor      Mobility Bed Mobility Bed Mobility: Sit to Supine Sit to Supine: 6: Modified independent (Device/Increase time);HOB elevated;With rail Transfers Transfers: Sit to Stand;Stand to Sit Sit to Stand: 5: Supervision;With upper extremity assist;From chair/3-in-1 Stand to Sit: 5: Supervision;With upper extremity assist;To bed        Exercise Other Exercises Other Exercises: Patient given HEP with yellow exercise band. Educated patient on each exercise, modifications that can be made, and precautions. Patient verbailzed understanding. Other Exercises: Patient given handout regarding energy conservation technique and strategies.      End of Session OT - End of Session Activity Tolerance: Patient limited by fatigue Patient left: in bed;with  call bell/phone within reach    [facility], OTR/L 09/30/2012, 3:02 PM

## 2012-10-01 ENCOUNTER — Inpatient Hospital Stay (HOSPITAL_COMMUNITY): Payer: 59

## 2012-10-01 ENCOUNTER — Encounter (HOSPITAL_COMMUNITY): Payer: Self-pay | Admitting: Internal Medicine

## 2012-10-01 DIAGNOSIS — T148XXA Other injury of unspecified body region, initial encounter: Secondary | ICD-10-CM | POA: Diagnosis present

## 2012-10-01 DIAGNOSIS — E042 Nontoxic multinodular goiter: Secondary | ICD-10-CM | POA: Diagnosis present

## 2012-10-01 DIAGNOSIS — IMO0002 Reserved for concepts with insufficient information to code with codable children: Secondary | ICD-10-CM

## 2012-10-01 DIAGNOSIS — R609 Edema, unspecified: Secondary | ICD-10-CM | POA: Diagnosis not present

## 2012-10-01 DIAGNOSIS — Z7901 Long term (current) use of anticoagulants: Secondary | ICD-10-CM

## 2012-10-01 HISTORY — DX: Nontoxic multinodular goiter: E04.2

## 2012-10-01 LAB — BASIC METABOLIC PANEL
BUN: 8 mg/dL (ref 6–23)
Calcium: 8.9 mg/dL (ref 8.4–10.5)
Chloride: 106 mEq/L (ref 96–112)
Creatinine, Ser: 0.99 mg/dL (ref 0.50–1.10)
GFR calc Af Amer: 80 mL/min — ABNORMAL LOW (ref >90)
GFR calc non Af Amer: 69 mL/min — ABNORMAL LOW (ref >90)

## 2012-10-01 LAB — CBC
HCT: 30.3 % — ABNORMAL LOW (ref 36.0–46.0)
Hemoglobin: 9.6 g/dL — ABNORMAL LOW (ref 12.0–15.0)
MCH: 25.9 pg — ABNORMAL LOW (ref 26.0–34.0)
MCH: 25.8 pg — ABNORMAL LOW (ref 26.0–34.0)
MCHC: 31.6 g/dL (ref 30.0–36.0)
MCHC: 31.8 g/dL (ref 30.0–36.0)
MCV: 81.3 fL (ref 78.0–100.0)
Platelets: 271 10*3/uL (ref 150–400)
Platelets: 283 10*3/uL (ref 150–400)
RBC: 3.72 MIL/uL — ABNORMAL LOW (ref 3.87–5.11)
RDW: 19.9 % — ABNORMAL HIGH (ref 11.5–15.5)
RDW: 19.8 % — ABNORMAL HIGH (ref 11.5–15.5)
WBC: 8.3 10*3/uL (ref 4.0–10.5)

## 2012-10-01 LAB — HEPARIN LEVEL (UNFRACTIONATED): Heparin Unfractionated: 0.35 IU/mL (ref 0.30–0.70)

## 2012-10-01 LAB — PROTIME-INR
INR: 2.48 — ABNORMAL HIGH (ref 0.00–1.49)
INR: 2 — ABNORMAL HIGH (ref 0.00–1.49)
Prothrombin Time: 21.9 seconds — ABNORMAL HIGH (ref 11.6–15.2)

## 2012-10-01 LAB — APTT
aPTT: >200 seconds (ref 24–37)
aPTT: 53 seconds — ABNORMAL HIGH (ref 24–37)
aPTT: 53 seconds — ABNORMAL HIGH (ref 24–37)

## 2012-10-01 MED ORDER — PROTAMINE SULFATE 10 MG/ML IV SOLN
INTRAVENOUS | Status: AC
  Filled 2012-10-01: qty 5

## 2012-10-01 MED ORDER — FUROSEMIDE 40 MG PO TABS
40.0000 mg | ORAL_TABLET | Freq: Every day | ORAL | Status: DC
  Administered 2012-10-01 – 2012-10-02 (×2): 40 mg via ORAL
  Filled 2012-10-01 (×2): qty 1

## 2012-10-01 MED ORDER — POTASSIUM CHLORIDE CRYS ER 20 MEQ PO TBCR
30.0000 meq | EXTENDED_RELEASE_TABLET | Freq: Two times a day (BID) | ORAL | Status: AC
  Administered 2012-10-01 (×2): 30 meq via ORAL
  Filled 2012-10-01 (×2): qty 1

## 2012-10-01 MED ORDER — DEXTROSE 5 % IV SOLN
1.0000 g | Freq: Two times a day (BID) | INTRAVENOUS | Status: DC
  Administered 2012-10-01 – 2012-10-02 (×4): 1 g via INTRAVENOUS
  Filled 2012-10-01 (×7): qty 10

## 2012-10-01 MED ORDER — SODIUM CHLORIDE 0.9 % IJ SOLN
INTRAMUSCULAR | Status: AC
  Administered 2012-10-01: 10 mL
  Filled 2012-10-01: qty 3

## 2012-10-01 MED ORDER — PROTAMINE SULFATE 10 MG/ML IV SOLN
15.0000 mg | INTRAVENOUS | Status: AC
  Administered 2012-10-01: 15 mg via INTRAVENOUS
  Filled 2012-10-01: qty 1.5

## 2012-10-01 NOTE — Progress Notes (Signed)
Patient ID: [name], female   DOB: 03/09/1971, 41 y.o.   MRN: [id number]

## 2012-10-01 NOTE — Progress Notes (Signed)
4 Days Post-Op Procedure(s) (LRB): HYSTERECTOMY SUPRACERVICAL ABDOMINAL (N/A)  Dr Blair Dolphin call and care appreciated.  Pt has become too anticoagulated while on Heparin 2000 units/Hour, and aPTT elevated at >200 seconds. She has received Protamine 15 mg IV and aPTT improved to 53secs(1.5x upper limits of goal) The JP drain continues to produce, and on exam she has a large sub-q hematoma.  It is unable to be determined at present if intraabdominal bleeding has occurred, but it is certainly likely.  Subjective: Patient reports incisional pain and tolerating PO.    Objective: I have reviewed patient's vital signs, medications and pathology. Blood pressure 122/80, pulse 58, temperature 97.4 F (36.3 C), temperature source Oral, resp. rate 12, height 5\' 9"  (1.753 m), weight 93.2 kg (205 lb 7.5 oz), last menstrual period 09/18/2012, SpO2 98.00%. General: alert, cooperative, no distress and mild discomfort at incision GI: abnormal findings:  guarding and incision: bloody drainage present and large hard swelling in incision itself, reaching to umbilicus.  JP  continues to produce steadily. Vaginal Bleeding: none  Assessment: s/p Procedure(s) (LRB) with comments: HYSTERECTOMY SUPRACERVICAL ABDOMINAL (N/A): unstable and will need to be transferred to stepdown unit due to acuity of care.  Will folllow serial HGB/hct today, plan CT abdomen without contrast in am,   Plan: transfer to stepdown unit. Stop heparin and coumadin for now.  CT in AM  The hematoma will need evacuation , likely on Sunday.Ct will determine if abdominal clots exist as well.  Will type and screen today, begin antibiotics to reduce risks of wound infection.  LOS: 8 days    Aynsley Fleet V 10/01/2012, 8:27 AM

## 2012-10-01 NOTE — Progress Notes (Signed)
Subjective: Events noted overnight for bleeding around the patient's postoperative site. Apparently, there was profuse bleeding around the JP drain with clots expressed. The  aPTT was found to be greater than 200.  She was given 15 mg of protamine. She was transferred to the step down unit for closer monitoring. Currently, the patient is in no acute distress. She is sitting up eating lunch. She denies abdominal pain.  Objective: Vital signs in last 24 hours: Filed Vitals:   10/01/12 0806 10/01/12 0854 10/01/12 1134 10/01/12 1200  BP: 122/80  136/86   Pulse: 58 62 61   Temp: 97.4 F (36.3 C)   97.9 F (36.6 C)  TempSrc: Oral   Oral  Resp: 12  11   Height:   5\' 9"  (1.753 m)   Weight:   92.7 kg (204 lb 5.9 oz)   SpO2:  99% 98%     Intake/Output Summary (Last 24 hours) at 10/01/12 1346 Last data filed at 10/01/12 1100  Gross per 24 hour  Intake 772.33 ml  Output   1270 ml  Net -497.67 ml    Weight change: -3.643 kg (-8 lb 0.5 oz)  Physical exam: General: The patient is a 41 year old African-American woman sitting up in bed, eating lunch. Lungs: Clear to auscultation bilaterally. Heart: S1, S2, with no murmurs rubs or gallops. Abdomen: Positive bowel sounds; incision site with bloody drainage, partially obscured by gauze; large indurated area approximating the incision site ; serosanguineous fluid in the JP drain; mildly to moderately diffusely tender over the incision site/postoperative site. Extremities: Left upper extremity with 2+ edema, right upper extremity with 1+ edema, and bilateral lower extremities with 1-2+ bilateral pitting edema.   Lab Results: Basic Metabolic Panel:  Basename 10/01/12 0404 09/30/12 0455  NA 137 136  K 3.4* 3.3*  CL 106 105  CO2 26 25  GLUCOSE 88 85  BUN 8 11  CREATININE 0.99 1.08  CALCIUM 8.9 8.7  MG -- --  PHOS -- --   Liver Function Tests: No results found for this basename: AST:2,ALT:2,ALKPHOS:2,BILITOT:2,PROT:2,ALBUMIN:2 in the last  72 hours No results found for this basename: LIPASE:2,AMYLASE:2 in the last 72 hours No results found for this basename: AMMONIA:2 in the last 72 hours CBC:  Basename 10/01/12 0404 09/30/12 0455  WBC 8.3 7.7  NEUTROABS -- --  HGB 9.9* 10.6*  HCT 31.3* 33.7*  MCV 80.7 81.0  PLT 271 289   Cardiac Enzymes: No results found for this basename: CKTOTAL:3,CKMB:3,CKMBINDEX:3,TROPONINI:3 in the last 72 hours BNP: No results found for this basename: PROBNP:3 in the last 72 hours D-Dimer: No results found for this basename: DDIMER:2 in the last 72 hours CBG: No results found for this basename: GLUCAP:6 in the last 72 hours Hemoglobin A1C: No results found for this basename: HGBA1C in the last 72 hours Fasting Lipid Panel: No results found for this basename: CHOL,HDL,LDLCALC,TRIG,CHOLHDL,LDLDIRECT in the last 72 hours Thyroid Function Tests:  Basename 09/30/12 0455  TSH 1.853  T4TOTAL --  FREET4 1.57  T3FREE 3.5  THYROIDAB --   Anemia Panel: No results found for this basename: VITAMINB12,FOLATE,FERRITIN,TIBC,IRON,RETICCTPCT in the last 72 hours Coagulation:  Basename 10/01/12 0453 10/01/12 0404  LABPROT 21.9* 25.7*  INR 2.00* 2.48*   Urine Drug Screen: Drugs of Abuse  No results found for this basename: labopia, cocainscrnur, labbenz, amphetmu, thcu, labbarb    Alcohol Level: No results found for this basename: ETH:2 in the last 72 hours Urinalysis: No results found for this basename: COLORURINE:2,APPERANCEUR:2,LABSPEC:2,PHURINE:2,GLUCOSEU:2,HGBUR:2,BILIRUBINUR:2,KETONESUR:2,PROTEINUR:2,UROBILINOGEN:2,NITRITE:2,LEUKOCYTESUR:2 in the  last 72 hours Misc. Labs:   Micro: Recent Results (from the past 240 hour(s))  URINE CULTURE     Status: Normal   Collection Time   09/23/12  2:25 PM      Component Value Range Status Comment   Specimen Description URINE, CLEAN CATCH   Final    Special Requests NONE   Final    Culture  Setup Time 09/24/2012 01:05   Final    Colony Count NO  GROWTH   Final    Culture NO GROWTH   Final    Report Status 09/24/2012 FINAL   Final   CULTURE, BLOOD (ROUTINE X 2)     Status: Normal   Collection Time   09/23/12  8:39 PM      Component Value Range Status Comment   Specimen Description BLOOD LEFT HAND   Final    Special Requests BOTTLES DRAWN AEROBIC AND ANAEROBIC 6CC   Final    Culture NO GROWTH 5 DAYS   Final    Report Status 09/28/2012 FINAL   Final   CULTURE, BLOOD (ROUTINE X 2)     Status: Normal   Collection Time   09/23/12  8:41 PM      Component Value Range Status Comment   Specimen Description BLOOD LEFT ANTECUBITAL   Final    Special Requests BOTTLES DRAWN AEROBIC AND ANAEROBIC 6CC   Final    Culture NO GROWTH 5 DAYS   Final    Report Status 09/28/2012 FINAL   Final   CULTURE, ROUTINE-GENITAL     Status: Normal   Collection Time   09/23/12 11:50 PM      Component Value Range Status Comment   Specimen Description VAGINA   Final    Special Requests DISCHARGE   Final    Culture NORMAL VAGINAL FLORA   Final    Report Status 09/27/2012 FINAL   Final   MRSA PCR SCREENING     Status: Normal   Collection Time   09/24/12  2:44 AM      Component Value Range Status Comment   MRSA by PCR NEGATIVE  NEGATIVE Final   WET PREP, GENITAL     Status: Abnormal   Collection Time   09/24/12  2:35 PM      Component Value Range Status Comment   Yeast Wet Prep HPF POC NONE SEEN  NONE SEEN Final    Trich, Wet Prep NONE SEEN  NONE SEEN Final    Clue Cells Wet Prep HPF POC RARE (*) NONE SEEN Final    WBC, Wet Prep HPF POC TOO NUMEROUS TO COUNT (*) NONE SEEN Final     Studies/Results: Ct Chest W Contrast  09/30/2012  *RADIOLOGY REPORT*  Clinical Data: Multiple extremity DVTs, thyroid nodule, evaluate for malignancy  CT CHEST WITH CONTRAST  Technique:  Multidetector CT imaging of the chest was performed following the standard protocol during bolus administration of intravenous contrast.  Contrast: 80mL OMNIPAQUE IOHEXOL 300 MG/ML  SOLN   Comparison: CT abdomen/pelvis 09/25/2012; chest x-ray 09/26/2012  Findings:  Mediastinum: Heterogeneous multinodular thyroid gland.  Right upper extremity PICC the catheter tip terminates in the upper right atrium.  No suspicious mediastinal or hilar adenopathy. Visualized esophagus is within normal limits.  Heart/Vascular: The heart is mildly enlarged.  No pericardial effusion.  Variant aortic arch branching pattern.  The left carotid right brachiocephalic arteries share a common origin.  The left vertebral artery arises directly from the thoracic aorta.  No dissection, or  aneurysmal dilatation.  Lungs/Pleura: Small 3 mm triangular subpleural nodule associated with a minor fissure, likely a subpleural lymph node.  Negative for edema, or focal airspace consolidation.  Trace right pleural effusion.  Mild dependent atelectasis in the lower lobes bilaterally.  Upper Abdomen: Locules of free intraperitoneal air are noted in the perihepatic space, and within the porta hepatis.  Otherwise, the upper abdomen is unremarkable  Bones: No acute fracture or aggressive appearing lytic or blastic osseous lesion.  Vertebral segmentation defect.  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  There is associated focal kyphosis at this level and partial bony fusion of the posterior elements of T10, and T11.  IMPRESSION:  1.  Negative for intrathoracic malignancy. 2.  Enlarged, multinodular thyroid gland is nonspecific by CT. 3.  Small right pleural effusion and trace bibasilar dependent atelectasis. 4.  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  Locules of free intraperitoneal air noted in the porta hepatis and perihepatic space.  Presumably this is related to the patient's recent hysterectomy. 6.  Borderline cardiomegaly   Original Report Authenticated By: Sterling Big, M.D.     Medications:  Scheduled:   . cefTRIAXone (ROCEPHIN)  IV  1  g Intravenous Q12H  . docusate sodium  100 mg Oral BID  . feeding supplement  237 mL Oral BID BM  . multivitamin  5 mL Oral Daily  . pantoprazole  40 mg Oral Q1200  . propranolol  20 mg Oral BID  . protamine  15 mg Intravenous STAT  . sodium chloride      . warfarin  5 mg Oral ONCE-1800  . DISCONTD: Warfarin - Pharmacist Dosing Inpatient   Does not apply q1800   Continuous:   . dextrose 5% lactated ringers with KCl 20 mEq/L 20 mL/hr at 10/01/12 1100  . DISCONTD: heparin 2,000 Units/hr (09/30/12 1448)   WJX:BJYNWGN, ondansetron (ZOFRAN) IV, ondansetron, oxyCODONE-acetaminophen, zolpidem  Assessment: Principal Problem:  *Dehydration Active Problems:  Fibroid uterus  Graves disease  Chronic blood loss anemia  Chronic kidney disease  Acute DVT (deep venous thrombosis)  Thyroid mass  Hematoma  Anticoagulation excessive   1. Postoperative bleeding/subcutaneous hematoma in the setting of excessive anticoagulation. Status post IV protamine. Both heparin and warfarin have been discontinued for now per Dr. Emelda Fear. The patient's hemoglobin and hematocrit will need to be monitored more closely.  Necrotic uterine fibroid, status post hysterectomy. The patient had been treated with Zosyn, but it was discontinued. She remains on Rocephin.  Anemia secondary to chronic blood loss and now acute blood loss. Status post a total of 4 units of packed red blood cell transfusion.  Bilateral lower extremity DVTs. Her INR is therapeutic, however, because of the bleeding, heparin and warfarin have been discontinued. Hypercoagulable panel nondiagnostic in the setting of acute thromboembolism. She will need to have a hematology followup in the outpatient setting.  Hyperthyroidism/Graves' disease with thyroid mass. Status post biopsy. Per Dr. 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 She will need to have a total thyroidectomy at a later date. The patient's TSH and free  T4 are within normal limits.  Peripheral edema. Status post IV fluid hydration and 4 units of blood. Her ins and outs are +10+ liters.    Plan:  1. We'll monitor the patient's hemoglobin every 8 hours over the next 24 hours. If  it falls more than 2-1/2  g, consider another unit of packed red blood cells. 2. Give gentle Lasix to treat peripheral edema. 3. Replete potassium chloride. We'll check a blood magnesium level. 4. If the patient becomes hemodynamically unstable, would get a CT of her abdomen and pelvis. Otherwise, wait until the CT as ordered in the morning. 5. Per Dr. Emelda Fear, the tentative plan is for hematoma evacuation, likely on Sunday.     LOS: 8 days   Alcario Tinkey 10/01/2012, 1:46 PM

## 2012-10-01 NOTE — Progress Notes (Signed)
10/01/2012 4:11 AM Status post hysterectomy; Postop day #4 Called to see patient for profuse bleeding around pigtail drain; clots expressed; and then during filling or emptying x3 Patient is on a heparin drip; received first dose of warfarin last night . On examination patient alert and oriented, no distress Pulse 65/min (on Inderal); BP 122/80  Assessment:  Bleeding  into a postoperative subcutaneous space secondary to iatrogenic coagulopathy.  Plan: Discontinue heparin drip and reverse anticoagulation with intravenous protamine. Discontinue warfarin. Check heparin level, PTT, and CBC  Continue to monitor vitals; if there is a concern get CT-scan of the abdomen without contrast to assess for retroperitoneal bleed - Discussed with her gynecologist Dr. Emelda Fear, feels that DVTs are likely nonacute and it is fairly safe to reverse anticoagulation at this time.

## 2012-10-01 NOTE — Progress Notes (Signed)
ANTICOAGULATION CONSULT NOTE  Pharmacy Consult for Anticoagulation has been stopped due to bleeding around JP drain and hematoma.  Heparin was reversed with Protamine but level slow to return to baseline.  No Known Allergies  Patient Measurements: Height: 5\' 9"  (175.3 cm) Weight: 205 lb 7.5 oz (93.2 kg) IBW/kg (Calculated) : 66.2   Vital Signs: Temp: 97.4 F (36.3 C) (10/04 0806) Temp src: Oral (10/04 0806) BP: 122/80 mmHg (10/04 0806) Pulse Rate: 62  (10/04 0854)  Labs:  Alvira Philips 10/01/12 0453 10/01/12 0420 10/01/12 0407 10/01/12 0404 09/30/12 0455 09/29/12 1631 09/29/12 0434  HGB -- -- -- 9.9* 10.6* -- --  HCT -- -- -- 31.3* 33.7* -- 33.4*  PLT -- -- -- 271 289 -- 252  APTT 53* -- >200* -- -- -- --  LABPROT 21.9* -- -- 25.7* 18.5* -- --  INR 2.00* -- -- 2.48* 1.59* -- --  HEPARINUNFRC -- 0.35 -- -- 0.45 0.23* --  CREATININE -- -- -- 0.99 1.08 -- 1.35*  CKTOTAL -- -- -- -- -- -- --  CKMB -- -- -- -- -- -- --  TROPONINI -- -- -- -- -- -- --   Estimated Creatinine Clearance: 89.1 ml/min (by C-G formula based on Cr of 0.99).  Medical History: Past Medical History  Diagnosis Date  . Thyroid disease   . Fibroid tumor   . DVT (deep venous thrombosis) 09/30/12    in upper extremities b/l and LLE  . Anemia   . Thyroid nodule     s/p biopsy  . Graves disease    Medications:  Scheduled:     . cefTRIAXone (ROCEPHIN)  IV  1 g Intravenous Q12H  . docusate sodium  100 mg Oral BID  . feeding supplement  237 mL Oral BID BM  . multivitamin  5 mL Oral Daily  . pantoprazole  40 mg Oral Q1200  . potassium chloride  40 mEq Oral Once  . propranolol  20 mg Oral BID  . protamine  15 mg Intravenous STAT  . sodium chloride      . warfarin  5 mg Oral ONCE-1800  . DISCONTD: Warfarin - Pharmacist Dosing Inpatient   Does not apply q1800   Assessment: 41yo female with acute DVT.  Started on IV Heparin.  Last pm pt developed bleeding around JP drain and MD ordered Protamine to reverse  Heparin.  Coumadin has been d/c'd.  Pt transferred to step down for close monitoring.  Goal of Therapy:  Continue to monitor for bleeding complications. Monitor platelets by anticoagulation protocol: Yes   Plan: D/C heparin and coumadin F/U INR, aPTT, Heparin level per MD orders Daily CBC & INR for now  Valrie Hart A 10/01/2012,11:21 AM

## 2012-10-01 NOTE — Progress Notes (Signed)
UR Chart Review Completed  

## 2012-10-01 NOTE — Progress Notes (Signed)
Pt has had a change in medical status and we will therefore need to place PT on hold.  Please reconsult when PT is appropriate.  Thanks!

## 2012-10-01 NOTE — Progress Notes (Signed)
[name], [name]              ACCOUNT NO.:  [id number]  MEDICAL RECORD NO.:  [id number]  LOCATION:  IC01                          FACILITY:  [facility]  PHYSICIAN:  Purcell Nails, MD DATE OF BIRTH:  1971-11-30  DATE OF PROCEDURE:  10/01/2012 DATE OF DISCHARGE:                                PROGRESS NOTE   REASON FOR FOLLOWUP:  Hyperthyroidism and multinodular goiter.  SUBJECTIVE:  The patient is on postop day #4 after hysterectomy for necrotic fibroid which required hysterectomy.  The patient also has multinodular goiter status post biopsy.  She had bleeding episodes around her surgical wounds after course of anticoagulation.  Because of that she is transferred to ICU for monitoring.  OBJECTIVE:  GENERAL:  She is alert and oriented x3, in no acute distress. VITAL SIGNS:  Blood pressure is 136/86, pulse rate 61, temperature 97.4. HEENT:  Well hydrated, less hair. NECK:  Significant for thyromegaly. CHEST:  Clear to auscultation bilaterally. CARDIOVASCULAR:  Normal S1, S2.  No murmur.  No gallop. ABDOMEN:  Cleanly dressed postsurgical wound. EXTREMITIES:  Resolving edema. CNS:  Nonfocal.  LABORATORY DATA:  Labs show thyroid ultrasound showing in the right lobe being 2.3 x 3.3 x 1.5 cm with focal nodule size 1.8 x 2.0 x 3.1.  The left lobe dimension is 2.9 x 3.1 x 5.5 cm with big nodule measuring 2.5 x 2.9 x 2.5 cm solid nodule.  Her repeat TSH is normal at 1.8.  Her repeat free T4 is 1.57 normal, free T3 is normal and stable at 3.5.  Her thyroid biopsy result shows that the left thyroid lobe nodule is significant for follicular lesion of undetermined significance. 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  ASSESSMENT: 1. 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 2. Clinically  resolving hyperthyroidism. 3. Status post hysterectomy for necrotic fibroid. 4. Acute deep venous thrombosis. 5. Chronic kidney disease. 6. Chronic anemia.  PLAN:  Although she will not need immediate intervention for hyperthyroidism, she will need total thyroidectomy on a later day to address the neoplastic multinodular goiter.  Given lack of clarity of cytology in Hurthle cell lesions and follicular lesions on initial biopsy and with well documented risk of at least 15-20% malignancy, the way to go with total thyroidectomy with subsequent replacement of thyroid hormone is reasonable approach to go.  This does not have to be done during her current hospitalization and she is informed of the outcome and possible next action after her acute situation is stabilized.  She understands the process and I will see her as an outpatient for multinodular goiter status post biopsy showing Hurthle cell and follicular lesions on bilateral thyroid lobes, 1 week after her discharge.          ______________________________ Purcell Nails, MD     GN/MEDQ  D:  10/01/2012  T:  10/01/2012  Job:  [id number]

## 2012-10-01 NOTE — Progress Notes (Addendum)
ANTICOAGULATION CONSULT NOTE - Follow Up Consult  Pharmacy Consult for acute bleeding from heparin anticoagulation Indication:  See note from Drs. Orvan Falconer and Emelda Fear  No Known Allergies  Patient Measurements: Height: 5\' 9"  (175.3 cm) Weight: 213 lb 8 oz (96.843 kg) IBW/kg (Calculated) : 66.2 *  Vital Signs: Temp: 97.5 F (36.4 C) (10/03 2112) Temp src: Oral (10/03 2112) BP: 122/80 mmHg (10/04 0409) Pulse Rate: 63  (10/04 0409)  Labs:  Basename 10/01/12 0404 09/30/12 0455 09/29/12 1631 09/29/12 0434 09/28/12 0439  HGB 9.9* 10.6* -- -- --  HCT 31.3* 33.7* -- 33.4* --  PLT 271 289 -- 252 --  APTT -- -- -- -- --  LABPROT 25.7* 18.5* -- -- --  INR 2.48* 1.59* -- -- --  HEPARINUNFRC -- 0.45 0.23* 0.11* --  CREATININE -- 1.08 -- 1.35* 1.24*  CKTOTAL -- -- -- -- --  CKMB -- -- -- -- --  TROPONINI -- -- -- -- --    Estimated Creatinine Clearance: 83.1 ml/min (by C-G formula based on Cr of 1.08).   Medications:  Scheduled:    . docusate sodium  100 mg Oral BID  . feeding supplement  237 mL Oral BID BM  . multivitamin  5 mL Oral Daily  . pantoprazole  40 mg Oral Q1200  . potassium chloride  40 mEq Oral Once  . propranolol  20 mg Oral BID  . protamine  15 mg Intravenous STAT  . warfarin  5 mg Oral ONCE-1800  . DISCONTD: Warfarin - Pharmacist Dosing Inpatient   Does not apply q1800   Infusions:    . dextrose 5% lactated ringers with KCl 20 mEq/L 20 mL/hr at 09/30/12 1105  . DISCONTD: heparin 2,000 Units/hr (09/30/12 1448)   PRN: iohexol, ondansetron (ZOFRAN) IV, ondansetron, oxyCODONE-acetaminophen, zolpidem  Assessment: acute bleeding from around JP drain at wound site.  House coverage RN states floor nurse estimate of blood loss so far.  Dr Orvan Falconer desires immediate heparin reversal with Protamine tx.  Heparin drip stopped approx with STAT aPTT drawn.  Goal of Therapy:  1.  Protamine slow IV push with monitoring for hypotension 2.  Reduction of  Protamine dose to 15-20mg , based on time since stopping drip and on drip rate of 2000 units/hr. 3.  Noted to MD that overdose of Protamine also results in bleeding.   Plan:  1.  STAT protamine dose 15mg  slow IV push 2.  STAT aPTT after Protamine 3.  Possible repeat dose of 5mg  Protamine if patient still with excessive clinical bleeding.   [name], [name] 10/01/2012,4:31 AM  Addendum: RN states no further acute bleeding after Protamine dose of 15mg  at 0446am.  APTT= 53sec at 0453am after Protamine dose.   INR=2.00 (still on warfarin partial anticoagulation after 3 days' doses) and after Protamine dose reversing heparin anticoagulation.   H/H just prior to Protamine reversal = 9.9/31.3 and pltc= 271.  No further Protamine dose required.  No plan at this time tonight  to reverse warfarin anticoagulation.  Day shift clinical pharmacist to discuss with MD for further anticoagulation plans.

## 2012-10-02 ENCOUNTER — Encounter (HOSPITAL_COMMUNITY): Payer: Self-pay | Admitting: Internal Medicine

## 2012-10-02 ENCOUNTER — Other Ambulatory Visit: Payer: Self-pay | Admitting: Obstetrics and Gynecology

## 2012-10-02 ENCOUNTER — Inpatient Hospital Stay (HOSPITAL_COMMUNITY): Payer: 59

## 2012-10-02 DIAGNOSIS — R894 Abnormal immunological findings in specimens from other organs, systems and tissues: Secondary | ICD-10-CM

## 2012-10-02 DIAGNOSIS — R760 Raised antibody titer: Secondary | ICD-10-CM

## 2012-10-02 HISTORY — DX: Raised antibody titer: R76.0

## 2012-10-02 LAB — BASIC METABOLIC PANEL
BUN: 7 mg/dL (ref 6–23)
CO2: 28 mEq/L (ref 19–32)
Glucose, Bld: 88 mg/dL (ref 70–99)
Potassium: 3.6 mEq/L (ref 3.5–5.1)
Sodium: 138 mEq/L (ref 135–145)

## 2012-10-02 LAB — CBC
HCT: 28.5 % — ABNORMAL LOW (ref 36.0–46.0)
HCT: 30.5 % — ABNORMAL LOW (ref 36.0–46.0)
MCH: 26.4 pg (ref 26.0–34.0)
MCV: 81.7 fL (ref 78.0–100.0)
Platelets: 245 10*3/uL (ref 150–400)
RBC: 3.49 MIL/uL — ABNORMAL LOW (ref 3.87–5.11)
RBC: 3.72 MIL/uL — ABNORMAL LOW (ref 3.87–5.11)
RDW: 20.1 % — ABNORMAL HIGH (ref 11.5–15.5)
RDW: 20.1 % — ABNORMAL HIGH (ref 11.5–15.5)
WBC: 8.1 10*3/uL (ref 4.0–10.5)
WBC: 8.8 10*3/uL (ref 4.0–10.5)

## 2012-10-02 MED ORDER — SODIUM CHLORIDE 0.9 % IJ SOLN
INTRAMUSCULAR | Status: AC
  Filled 2012-10-02: qty 3

## 2012-10-02 MED ORDER — DEXTROSE 5 % IV SOLN
1.0000 mg | Freq: Once | INTRAVENOUS | Status: AC
  Administered 2012-10-02: 1 mg via INTRAVENOUS
  Filled 2012-10-02: qty 0.1

## 2012-10-02 MED ORDER — POTASSIUM CHLORIDE CRYS ER 20 MEQ PO TBCR
30.0000 meq | EXTENDED_RELEASE_TABLET | Freq: Two times a day (BID) | ORAL | Status: DC
  Administered 2012-10-02 – 2012-10-03 (×3): 30 meq via ORAL
  Filled 2012-10-02 (×3): qty 1

## 2012-10-02 MED ORDER — POLYETHYLENE GLYCOL 3350 17 G PO PACK
17.0000 g | PACK | Freq: Every day | ORAL | Status: DC
  Administered 2012-10-02 – 2012-10-07 (×5): 17 g via ORAL
  Filled 2012-10-02 (×5): qty 1

## 2012-10-02 NOTE — Progress Notes (Signed)
Subjective: The patient states that she urinated a lot yesterday after being given Lasix. (Apparently not all urine output was recorded). She has some abdominal discomfort, but mild to moderate. She denies nausea vomiting. She is tolerating her diet fairly well. She believes the bleeding around her incision site has slowed down a little.  Objective: Vital signs in last 24 hours: Filed Vitals:   10/02/12 0330 10/02/12 0400 10/02/12 0500 10/02/12 0600  BP:  120/73 122/83 136/86  Pulse:    64  Temp: 98.1 F (36.7 C)     TempSrc: Oral     Resp:  10 10 12   Height:      Weight:      SpO2:    99%    Intake/Output Summary (Last 24 hours) at 10/02/12 0851 Last data filed at 10/02/12 0600  Gross per 24 hour  Intake 1899.33 ml  Output    390 ml  Net 1509.33 ml    Weight change: -0.5 kg (-1 lb 1.6 oz)  Physical exam: General: The patient is a 41 year old African-American woman sitting up in bed, eating lunch. Lungs: Clear to auscultation bilaterally. Heart: S1, S2, with no murmurs rubs or gallops. Abdomen: Positive bowel sounds; incision site with bloody drainage, partially obscured by gauze; large indurated area approximating the incision site ; serosanguineous fluid in the JP drain; mildly to moderately diffusely tender over the incision site/postoperative site. Extremities: Left upper extremity with 1+ edema, right upper extremity with trace to 1+ edema, and bilateral lower extremities with 1-2+ bilateral pitting edema.   Lab Results: Basic Metabolic Panel:  Basename 10/02/12 0506 10/01/12 0404  NA 138 137  K 3.6 3.4*  CL 105 106  CO2 28 26  GLUCOSE 88 88  BUN 7 8  CREATININE 0.95 0.99  CALCIUM 8.9 8.9  MG -- --  PHOS -- --   Liver Function Tests: No results found for this basename: AST:2,ALT:2,ALKPHOS:2,BILITOT:2,PROT:2,ALBUMIN:2 in the last 72 hours No results found for this basename: LIPASE:2,AMYLASE:2 in the last 72 hours No results found for this basename: AMMONIA:2  in the last 72 hours CBC:  Basename 10/02/12 0506 10/01/12 2123  WBC 8.1 8.8  NEUTROABS -- --  HGB 9.2* 9.6*  HCT 28.5* 30.3*  MCV 81.7 81.5  PLT 249 261   Cardiac Enzymes: No results found for this basename: CKTOTAL:3,CKMB:3,CKMBINDEX:3,TROPONINI:3 in the last 72 hours BNP: No results found for this basename: PROBNP:3 in the last 72 hours D-Dimer: No results found for this basename: DDIMER:2 in the last 72 hours CBG: No results found for this basename: GLUCAP:6 in the last 72 hours Hemoglobin A1C: No results found for this basename: HGBA1C in the last 72 hours Fasting Lipid Panel: No results found for this basename: CHOL,HDL,LDLCALC,TRIG,CHOLHDL,LDLDIRECT in the last 72 hours Thyroid Function Tests:  Basename 09/30/12 0455  TSH 1.853  T4TOTAL --  FREET4 1.57  T3FREE 3.5  THYROIDAB --   Anemia Panel: No results found for this basename: VITAMINB12,FOLATE,FERRITIN,TIBC,IRON,RETICCTPCT in the last 72 hours Coagulation:  Basename 10/02/12 0506 10/01/12 0453  LABPROT 23.4* 21.9*  INR 2.19* 2.00*   Urine Drug Screen: Drugs of Abuse  No results found for this basename: labopia,  cocainscrnur,  labbenz,  amphetmu,  thcu,  labbarb    Alcohol Level: No results found for this basename: ETH:2 in the last 72 hours Urinalysis: No results found for this basename: COLORURINE:2,APPERANCEUR:2,LABSPEC:2,PHURINE:2,GLUCOSEU:2,HGBUR:2,BILIRUBINUR:2,KETONESUR:2,PROTEINUR:2,UROBILINOGEN:2,NITRITE:2,LEUKOCYTESUR:2 in the last 72 hours Misc. Labs:   Micro: Recent Results (from the past 240 hour(s))  URINE CULTURE  Status: Normal   Collection Time   09/23/12  2:25 PM      Component Value Range Status Comment   Specimen Description URINE, CLEAN CATCH   Final    Special Requests NONE   Final    Culture  Setup Time 09/24/2012 01:05   Final    Colony Count NO GROWTH   Final    Culture NO GROWTH   Final    Report Status 09/24/2012 FINAL   Final   CULTURE, BLOOD (ROUTINE X 2)      Status: Normal   Collection Time   09/23/12  8:39 PM      Component Value Range Status Comment   Specimen Description BLOOD LEFT HAND   Final    Special Requests BOTTLES DRAWN AEROBIC AND ANAEROBIC 6CC   Final    Culture NO GROWTH 5 DAYS   Final    Report Status 09/28/2012 FINAL   Final   CULTURE, BLOOD (ROUTINE X 2)     Status: Normal   Collection Time   09/23/12  8:41 PM      Component Value Range Status Comment   Specimen Description BLOOD LEFT ANTECUBITAL   Final    Special Requests BOTTLES DRAWN AEROBIC AND ANAEROBIC 6CC   Final    Culture NO GROWTH 5 DAYS   Final    Report Status 09/28/2012 FINAL   Final   CULTURE, ROUTINE-GENITAL     Status: Normal   Collection Time   09/23/12 11:50 PM      Component Value Range Status Comment   Specimen Description VAGINA   Final    Special Requests DISCHARGE   Final    Culture NORMAL VAGINAL FLORA   Final    Report Status 09/27/2012 FINAL   Final   MRSA PCR SCREENING     Status: Normal   Collection Time   09/24/12  2:44 AM      Component Value Range Status Comment   MRSA by PCR NEGATIVE  NEGATIVE Final   WET PREP, GENITAL     Status: Abnormal   Collection Time   09/24/12  2:35 PM      Component Value Range Status Comment   Yeast Wet Prep HPF POC NONE SEEN  NONE SEEN Final    Trich, Wet Prep NONE SEEN  NONE SEEN Final    Clue Cells Wet Prep HPF POC RARE (*) NONE SEEN Final    WBC, Wet Prep HPF POC TOO NUMEROUS TO COUNT (*) NONE SEEN Final     Studies/Results: Ct Abdomen Pelvis Wo Contrast  10/02/2012  *RADIOLOGY REPORT*  Clinical Data: Recent surgery.  Questionable hematoma versus infection.  The patient reportedly is on anticoagulants and had leading into the incision.  CT ABDOMEN AND PELVIS WITHOUT CONTRAST  Technique:  Multidetector CT imaging of the abdomen and pelvis was performed following the standard protocol without intravenous contrast.  Comparison: CT, 09/25/2012  Findings: There is minimal dependent lung base subsegmental  atelectasis.  A minimal right pleural effusion is present.  The lung bases otherwise clear and the heart is normal in size.  A complex hematoma lies along the anterior lower abdominal wall incision line contiguous with a surgical drain.  It measures approximately 8.6 cm by 6.6 cm x 5.8 cm in size.  There is a small amount of ascites that lies in the pelvis, between leaves of the mesentery in the lower abdomen and tracks along the pericolic gutters to lie adjacent to the liver and spleen.  More heterogeneous material is also seen in the pelvis above the bladder and vaginal cuff that is likely products hemorrhage.  This is relatively small in amount.  No defined intra-abdominal or pelvic hematoma is seen other than that along the surgical incision. There is a small amount of free intraperitoneal air.  This is presumed to be postsurgical.  The liver and spleen are unremarkable.  There is some dependent density in the gallbladder that was not present previously.  This may reflect some viscus bile or contrast excreted into the biliary system.  The gallbladder is otherwise unremarkable.  No bile duct dilation.  Normal pancreas. No adrenal masses.  The kidneys, ureters and bladder are unremarkable.  The markedly enlarged uterus and its degenerating fibroid have been removed since the prior study.  There are a few mildly enlarged retroperitoneal lymph nodes, which are stable from prior study.  No other adenopathy.  There is increase stool the colon.  The bowel is otherwise unremarkable.  A normal appendix is visualized.  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  This is stable.  IMPRESSION:   Original Report Authenticated By: Domenic Moras, M.D.    Ct Chest W Contrast  09/30/2012  *RADIOLOGY REPORT*  Clinical Data: Multiple extremity DVTs, thyroid nodule, evaluate for malignancy  CT CHEST WITH CONTRAST  Technique:  Multidetector CT  imaging of the chest was performed following the standard protocol during bolus administration of intravenous contrast.  Contrast: 80mL OMNIPAQUE IOHEXOL 300 MG/ML  SOLN  Comparison: CT abdomen/pelvis 09/25/2012; chest x-ray 09/26/2012  Findings:  Mediastinum: Heterogeneous multinodular thyroid gland.  Right upper extremity PICC the catheter tip terminates in the upper right atrium.  No suspicious mediastinal or hilar adenopathy. Visualized esophagus is within normal limits.  Heart/Vascular: The heart is mildly enlarged.  No pericardial effusion.  Variant aortic arch branching pattern.  The left carotid right brachiocephalic arteries share a common origin.  The left vertebral artery arises directly from the thoracic aorta.  No dissection, or aneurysmal dilatation.  Lungs/Pleura: Small 3 mm triangular subpleural nodule associated with a minor fissure, likely a subpleural lymph node.  Negative for edema, or focal airspace consolidation.  Trace right pleural effusion.  Mild dependent atelectasis in the lower lobes bilaterally.  Upper Abdomen: Locules of free intraperitoneal air are noted in the perihepatic space, and within the porta hepatis.  Otherwise, the upper abdomen is unremarkable  Bones: No acute fracture or aggressive appearing lytic or blastic osseous lesion.  Vertebral segmentation defect.  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  There is associated focal kyphosis at this level and partial bony fusion of the posterior elements of T10, and T11.  IMPRESSION:  1.  Negative for intrathoracic malignancy. 2.  Enlarged, multinodular thyroid gland is nonspecific by CT. 3.  Small right pleural effusion and trace bibasilar dependent atelectasis. 4.  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  Locules of free intraperitoneal air noted in the porta hepatis and perihepatic space.  Presumably this is related to the patient's recent  hysterectomy. 6.  Borderline cardiomegaly   Original Report Authenticated By: Sterling Big, M.D.     Medications:  Scheduled:    . cefTRIAXone (ROCEPHIN)  IV  1 g Intravenous Q12H  . docusate sodium  100 mg Oral BID  . feeding supplement  237 mL Oral BID  BM  . furosemide  40 mg Oral Daily  . multivitamin  5 mL Oral Daily  . pantoprazole  40 mg Oral Q1200  . phytonadione (VITAMIN K) IV  1 mg Intravenous Once  . potassium chloride  30 mEq Oral BID  . propranolol  20 mg Oral BID  . sodium chloride      . sodium chloride       Continuous:    . dextrose 5% lactated ringers with KCl 20 mEq/L 20 mL/hr at 10/02/12 0600   WNU:[id number] (ZOFRAN) IV, ondansetron, oxyCODONE-acetaminophen, zolpidem  Assessment: Principal Problem:  *Dehydration Active Problems:  Fibroid uterus  Graves disease  Chronic blood loss anemia  Chronic kidney disease  Acute DVT (deep venous thrombosis)  Thyroid mass  Hematoma  Anticoagulation excessive  Peripheral edema  Multinodular goiter  Lupus anticoagulant positive   1. Postoperative bleeding/subcutaneous hematoma in the setting of excessive anticoagulation. Status post IV protamine. Both heparin and warfarin have been discontinued for now per Dr. Emelda Fear. The patient's hemoglobin and hematocrit has fallen a little more than 1 g in 24 hours. No indication for additional packed red blood cell transfusions yet. Large hematoma noted on the CT of the abdomen.  Necrotic uterine fibroid, status post hysterectomy. The patient had been treated with Zosyn, but it was discontinued. In light of the hematoma, Dr. Emelda Fear started Rocephin empirically.  Anemia secondary to chronic blood loss and now acute blood loss. Status post a total of 4 units of packed red blood cell transfusion. As above, her hemoglobin has fallen a little more than 1 g in 24 hours. We'll continue to monitor.  Bilateral lower extremity DVTs. Her INR is therapeutic, however, because  of the bleeding, heparin and warfarin have been discontinued. Hypercoagulable panel nondiagnostic in the setting of acute thromboembolism, but she does have lupus anticoagulant positivity. She will need to have a Hematology followup in the outpatient setting.  Hyperthyroidism/Graves' disease with thyroid mass. Status post biopsy. Per Dr. 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 She will need to have a total thyroidectomy at a later date. The patient's TSH and free T4 are within normal limits.  Peripheral edema. Status post IV fluid hydration and 4 units of blood. Her ins and outs are +10+ liters. Her urine output does not appear to be recorded accurately. Nevertheless, with the addition of Lasix, she has some improvement in her peripheral edema. We'll continue Lasix for a few more days.    Plan:  1. Per Dr. Emelda Fear, he will take the patient to the operating room tomorrow for evacuation of the abdominal hematoma. 2. Continue to monitor the patient's hemoglobin/CBC at least twice daily. Continue to monitor her INR daily. 3. Continue Lasix and potassium chloride supplementation. 4. Continue supportive treatment. 5. Add laxative therapy.     LOS: 9 days   Jovante Hammitt 10/02/2012, 8:51 AM

## 2012-10-02 NOTE — Progress Notes (Signed)
5 Days Post-Op Procedure(s) (LRB): HYSTERECTOMY SUPRACERVICAL ABDOMINAL (N/A)  Subjective: Patient reports incisional pain, tolerating PO, + flatus and no problems voiding.    Objective: I have reviewed patient's vital signs, medications, labs and radiology results. CT shows a well defined hematoma in sub-Q space.  Abdomen has a small amount of fluid in pelvis,, but no defined hematoma.   INR/Prothrombin Time remailns elevated at 2.0.  BP 136/86  Pulse 64  Temp 98.1 F (36.7 C) (Oral)  Resp 12  Ht 5\' 9"  (1.753 m)  Wt 92.7 kg (204 lb 5.9 oz)  BMI 30.18 kg/m2  SpO2 99%  LMP 09/18/2012 General: alert, cooperative and no distress GI: normal findings: bowel sounds normal and incision intact but swollen with stable hematoma. JP drain producing small amounts Vaginal Bleeding: none  Assessment: s/p Procedure(s) (LRB) with comments: HYSTERECTOMY SUPRACERVICAL ABDOMINAL (N/A): incison hematoma, s/p over anticoagulation , now improved Plan:Since INR still elevated, will give Vit K 1 mg i.v. ,  Goal to achieve INR below 1.4 at time of evacuation of hematoma tomorrow morning. NPO p MN Type and Screen in place.     LOS: 9 days    [name] V 10/02/2012, 8:33 AM

## 2012-10-02 NOTE — Progress Notes (Signed)
Consent for surgery for hematoma evacuation for 10/6 signed and placed in chart.

## 2012-10-03 ENCOUNTER — Encounter (HOSPITAL_COMMUNITY)
Admission: EM | Disposition: A | Discharge status: Home Health | Payer: Self-pay | Source: Home / Self Care | Attending: Internal Medicine

## 2012-10-03 ENCOUNTER — Inpatient Hospital Stay (HOSPITAL_COMMUNITY): Payer: 59 | Admitting: Anesthesiology

## 2012-10-03 ENCOUNTER — Encounter (HOSPITAL_COMMUNITY): Payer: Self-pay | Admitting: Anesthesiology

## 2012-10-03 DIAGNOSIS — IMO0002 Reserved for concepts with insufficient information to code with codable children: Secondary | ICD-10-CM | POA: Diagnosis not present

## 2012-10-03 DIAGNOSIS — D62 Acute posthemorrhagic anemia: Secondary | ICD-10-CM | POA: Diagnosis not present

## 2012-10-03 HISTORY — PX: HEMATOMA EVACUATION: SHX5118

## 2012-10-03 LAB — CBC
Hemoglobin: 8.9 g/dL — ABNORMAL LOW (ref 12.0–15.0)
Platelets: 256 10*3/uL (ref 150–400)
RBC: 3.47 MIL/uL — ABNORMAL LOW (ref 3.87–5.11)
WBC: 8 10*3/uL (ref 4.0–10.5)

## 2012-10-03 LAB — BASIC METABOLIC PANEL
CO2: 30 mEq/L (ref 19–32)
Calcium: 9.2 mg/dL (ref 8.4–10.5)
GFR calc non Af Amer: 80 mL/min — ABNORMAL LOW (ref >90)
Potassium: 3.7 mEq/L (ref 3.5–5.1)
Sodium: 135 mEq/L (ref 135–145)

## 2012-10-03 LAB — PROTIME-INR: INR: 1.57 — ABNORMAL HIGH (ref 0.00–1.49)

## 2012-10-03 SURGERY — WOUND EXPLORATION
Anesthesia: General | Site: Abdomen | Wound class: Clean

## 2012-10-03 MED ORDER — NEOSTIGMINE METHYLSULFATE 1 MG/ML IJ SOLN
INTRAMUSCULAR | Status: AC
Start: 2012-10-03 — End: 2012-10-03
  Filled 2012-10-03: qty 10

## 2012-10-03 MED ORDER — NEOSTIGMINE METHYLSULFATE 1 MG/ML IJ SOLN
INTRAMUSCULAR | Status: DC | PRN
  Administered 2012-10-03: 2 mg via INTRAVENOUS

## 2012-10-03 MED ORDER — SUCCINYLCHOLINE CHLORIDE 20 MG/ML IJ SOLN
INTRAMUSCULAR | Status: AC
  Filled 2012-10-03: qty 1

## 2012-10-03 MED ORDER — FENTANYL CITRATE 0.05 MG/ML IJ SOLN
INTRAMUSCULAR | Status: DC | PRN
  Administered 2012-10-03: 50 ug via INTRAVENOUS
  Administered 2012-10-03: 25 ug via INTRAVENOUS
  Administered 2012-10-03: 50 ug via INTRAVENOUS
  Administered 2012-10-03: 25 ug via INTRAVENOUS

## 2012-10-03 MED ORDER — BUPIVACAINE HCL (PF) 0.5 % IJ SOLN
INTRAMUSCULAR | Status: AC
  Filled 2012-10-03: qty 30

## 2012-10-03 MED ORDER — CEFAZOLIN SODIUM 1 G IJ SOLR
INTRAMUSCULAR | Status: AC
  Filled 2012-10-03: qty 20

## 2012-10-03 MED ORDER — SODIUM CHLORIDE 0.9 % IV SOLN
INTRAVENOUS | Status: DC
  Administered 2012-10-04 – 2012-10-05 (×2): via INTRAVENOUS

## 2012-10-03 MED ORDER — LACTATED RINGERS IV SOLN
INTRAVENOUS | Status: DC | PRN
  Administered 2012-10-03: 08:00:00 via INTRAVENOUS

## 2012-10-03 MED ORDER — SODIUM CHLORIDE 0.9 % IJ SOLN
INTRAMUSCULAR | Status: AC
  Filled 2012-10-03: qty 3

## 2012-10-03 MED ORDER — LIDOCAINE HCL 4 % MT SOLN
OROMUCOSAL | Status: DC | PRN
  Administered 2012-10-03: 4 mL via TOPICAL

## 2012-10-03 MED ORDER — PROPOFOL 10 MG/ML IV EMUL
INTRAVENOUS | Status: AC
  Filled 2012-10-03: qty 20

## 2012-10-03 MED ORDER — PIPERACILLIN-TAZOBACTAM 3.375 G IVPB 30 MIN
3.3750 g | Freq: Three times a day (TID) | INTRAVENOUS | Status: AC
  Administered 2012-10-03 (×2): 3.375 g via INTRAVENOUS
  Filled 2012-10-03 (×2): qty 50

## 2012-10-03 MED ORDER — GLYCOPYRROLATE 0.2 MG/ML IJ SOLN
INTRAMUSCULAR | Status: AC
  Filled 2012-10-03: qty 2

## 2012-10-03 MED ORDER — CEFAZOLIN SODIUM-DEXTROSE 2-3 GM-% IV SOLR
2.0000 g | Freq: Once | INTRAVENOUS | Status: AC
  Administered 2012-10-03: 2 g via INTRAVENOUS

## 2012-10-03 MED ORDER — GLYCOPYRROLATE 0.2 MG/ML IJ SOLN
INTRAMUSCULAR | Status: DC | PRN
  Administered 2012-10-03: 0.4 mg via INTRAVENOUS

## 2012-10-03 MED ORDER — FENTANYL CITRATE 0.05 MG/ML IJ SOLN
INTRAMUSCULAR | Status: AC
  Filled 2012-10-03: qty 2

## 2012-10-03 MED ORDER — IBUPROFEN 600 MG PO TABS: 600.0000 mg | ORAL_TABLET | Freq: Four times a day (QID) | ORAL | Status: DC | PRN

## 2012-10-03 MED ORDER — 0.9 % SODIUM CHLORIDE (POUR BTL) OPTIME
TOPICAL | Status: DC | PRN
  Administered 2012-10-03: 1000 mL

## 2012-10-03 MED ORDER — LIDOCAINE HCL (CARDIAC) 10 MG/ML IV SOLN
INTRAVENOUS | Status: DC | PRN
  Administered 2012-10-03: 10 mg via INTRAVENOUS

## 2012-10-03 MED ORDER — ROCURONIUM BROMIDE 100 MG/10ML IV SOLN
INTRAVENOUS | Status: DC | PRN
  Administered 2012-10-03: 35 mg via INTRAVENOUS
  Administered 2012-10-03: 5 mg via INTRAVENOUS

## 2012-10-03 MED ORDER — ROCURONIUM BROMIDE 50 MG/5ML IV SOLN
INTRAVENOUS | Status: AC
  Filled 2012-10-03: qty 1

## 2012-10-03 MED ORDER — LIDOCAINE HCL (PF) 1 % IJ SOLN
INTRAMUSCULAR | Status: AC
  Filled 2012-10-03: qty 5

## 2012-10-03 MED ORDER — PROPOFOL 10 MG/ML IV BOLUS
INTRAVENOUS | Status: DC | PRN
  Administered 2012-10-03: 40 mg via INTRAVENOUS
  Administered 2012-10-03: 140 mg via INTRAVENOUS

## 2012-10-03 MED ORDER — MIDAZOLAM HCL 5 MG/5ML IJ SOLN
INTRAMUSCULAR | Status: DC | PRN
  Administered 2012-10-03 (×2): 1 mg via INTRAVENOUS

## 2012-10-03 MED ORDER — ONDANSETRON HCL 4 MG/2ML IJ SOLN
INTRAMUSCULAR | Status: AC
  Filled 2012-10-03: qty 2

## 2012-10-03 MED ORDER — MIDAZOLAM HCL 2 MG/2ML IJ SOLN
INTRAMUSCULAR | Status: AC
  Filled 2012-10-03: qty 2

## 2012-10-03 MED ORDER — BUPIVACAINE HCL 0.5 % IJ SOLN
INTRAMUSCULAR | Status: DC | PRN
  Administered 2012-10-03: 20 mL

## 2012-10-03 MED ORDER — HYDROMORPHONE HCL PF 1 MG/ML IJ SOLN
0.2000 mg | INTRAMUSCULAR | Status: DC | PRN
  Administered 2012-10-03 – 2012-10-04 (×8): 0.5 mg via INTRAVENOUS
  Filled 2012-10-03 (×6): qty 1

## 2012-10-03 MED ORDER — SUCCINYLCHOLINE CHLORIDE 20 MG/ML IJ SOLN
INTRAMUSCULAR | Status: DC | PRN
  Administered 2012-10-03: 100 mg via INTRAVENOUS

## 2012-10-03 MED ORDER — ONDANSETRON HCL 4 MG/2ML IJ SOLN
INTRAMUSCULAR | Status: DC | PRN
  Administered 2012-10-03: 4 mg via INTRAVENOUS

## 2012-10-03 SURGICAL SUPPLY — 32 items
BENZOIN TINCTURE PRP APPL 2/3 (GAUZE/BANDAGES/DRESSINGS) ×2 IMPLANT
CLSR STERI-STRIP ANTIMIC 1/2X4 (GAUZE/BANDAGES/DRESSINGS) ×2 IMPLANT
COVER LIGHT HANDLE  1/PK (MISCELLANEOUS) ×2
COVER LIGHT HANDLE 1/PK (MISCELLANEOUS) ×2 IMPLANT
ELECT REM PT RETURN 9FT ADLT (ELECTROSURGICAL) ×2
ELECTRODE REM PT RTRN 9FT ADLT (ELECTROSURGICAL) ×1 IMPLANT
EVACUATOR DRAINAGE 10X20 100CC (DRAIN) ×1 IMPLANT
EVACUATOR SILICONE 100CC (DRAIN) ×1
GLOVE BIO SURGEON ST LM GN SZ9 (GLOVE) ×2 IMPLANT
GLOVE BIOGEL PI IND STRL 7.0 (GLOVE) ×2 IMPLANT
GLOVE BIOGEL PI IND STRL 9 (GLOVE) ×1 IMPLANT
GLOVE BIOGEL PI INDICATOR 7.0 (GLOVE) ×2
GLOVE BIOGEL PI INDICATOR 9 (GLOVE) ×1
GLOVE ECLIPSE 6.5 STRL STRAW (GLOVE) ×2 IMPLANT
GOWN STRL REIN 3XL LVL4 (GOWN DISPOSABLE) ×2 IMPLANT
GOWN STRL REIN XL XLG (GOWN DISPOSABLE) ×4 IMPLANT
NEEDLE HYPO 25X1 1.5 SAFETY (NEEDLE) ×2 IMPLANT
PACK ABDOMINAL MAJOR (CUSTOM PROCEDURE TRAY) ×2 IMPLANT
PAD ABD 5X9 TENDERSORB (GAUZE/BANDAGES/DRESSINGS) ×2 IMPLANT
PENCIL HANDSWITCHING (ELECTRODE) ×2 IMPLANT
SPONGE GAUZE 4X4 12PLY (GAUZE/BANDAGES/DRESSINGS) ×2 IMPLANT
SUCTION POOLE TIP (SUCTIONS) ×2 IMPLANT
SUT ETHILON 3 0 FSL (SUTURE) ×2 IMPLANT
SUT PDS AB CT VIOLET #0 27IN (SUTURE) ×2 IMPLANT
SUT PLAIN CT 1/2CIR 2-0 27IN (SUTURE) ×4 IMPLANT
SUT PROLENE 0 CT 1 30 (SUTURE) ×2 IMPLANT
SUT VIC AB 2-0 CT1 27 (SUTURE) ×1
SUT VIC AB 2-0 CT1 TAPERPNT 27 (SUTURE) ×1 IMPLANT
SUT VICRYL 4 0 KS 27 (SUTURE) ×2 IMPLANT
SYR CONTROL 10ML LL (SYRINGE) ×2 IMPLANT
TRAY FOLEY CATH 14FR (SET/KITS/TRAYS/PACK) ×2 IMPLANT
YANKAUER SUCT 12FT TUBE ARGYLE (SUCTIONS) ×2 IMPLANT

## 2012-10-03 NOTE — Addendum Note (Signed)
Addendum  created 10/03/12 0981 by Franco Nones, CRNA   Modules edited:Charges VN

## 2012-10-03 NOTE — Brief Op Note (Signed)
09/23/2012 - 10/03/2012  9:54 AM  PATIENT:  [name]  41 y.o. female  PRE-OPERATIVE DIAGNOSIS:  Wound hematoma  POST-OPERATIVE DIAGNOSIS:  Wound Hematoma  PROCEDURE:  Procedure(s) (LRB) with comments: WOUND EXPLORATION (N/A) EVACUATION HEMATOMA (N/A)  SURGEON:  Surgeon(s) and Role:    * Tilda Burrow, MD - Primary  PHYSICIAN ASSISTANT:   ASSISTANTS: Kendrick, CST   ANESTHESIA:   local and general  EBL:  Total I/O In: 420 [I.V.:420] Out: 825 [Urine:800; Blood:25]  BLOOD ADMINISTERED:none  DRAINS: (sub q space) Jackson-Pratt drain(s) with closed bulb suction in the sub-q space and Urinary Catheter (Foley)   LOCAL MEDICATIONS USED:  MARCAINE    and Amount: 20 ml  SPECIMEN:  No Specimen  DISPOSITION OF SPECIMEN:  N/A  COUNTS:  YES  TOURNIQUET:  * No tourniquets in log *  DICTATION: .Dragon Dictation Patient was taken to the operating room prepped and draped for lower abdominal surgery with Foley catheter in place, and DuraPrep used for skin surgical preparation. Ancef 2 g IV administered preprocedure timeout confirmed with surgical team. The previous midline incision was opened, the subcuticular skin closure with 4-0 Vicryl and the subcutaneous fatty tissue reapproximation with interrupted sutures all taken out and a large 400 to 500 cc clot removed from the subcutaneous space below the umbilicus. The entire length of the incision was opened to ensure that the area of the incision above the umbilicus was intact and some serous bloody dark fluid was removed from above the umbilicus but the wound appeared generally in good shape. The fascia was opened to inspect the abdominal cavity and the peritoneum opened, the lower one half of the incision. The abdominal cavity had a generous amount of clear serous fluid but no significant clots. Only a portion of the serous fluid was removed in order to adequately assess the pelvis. The ovaries bilaterally were visually normal and the  bowel was normal peritoneum was once again closed sponge and needle counts correct. Fascia was closed from inferior to superior using continuous running 0 Prolene, which was tied at its upper and and then tied again to the end of the Prolene suture had been begun cephalad and sown superior-to-inferior at the original surgery. The subcutaneous fatty space was copiously irrigated with saline and inspected for hemostasis. Her with some ecchymosis in the subcutaneous fat on the left side halfway between the umbilicus and the mons pubis but no bleeding could be identified from this area. The subcutaneous space was interrupted in its closure using 2-0 plain suture. A flat JP drain was placed deep and subcutaneous fatty tissue and allowed to exit on the left side of the inferior edge of the incision and sutured in place. The skin edges were reapproximated using 4-0 Vicryl on a Keith needle with good tissue edge approximation. Intraoperative blood loss and 25 cc. Old Clots from the subcutaneous space estimated at 400-500 cc. Marcaine was injected around incision x20 cc prior to subcuticular closure. patient to recovery room in excellent overall condition. PLAN OF CARE: Admit to inpatient   PATIENT DISPOSITION:  PACU - hemodynamically stable.   Delay start of Pharmacological VTE agent (>24hrs) due to surgical blood loss or risk of bleeding: yes

## 2012-10-03 NOTE — Anesthesia Preprocedure Evaluation (Addendum)
Anesthesia Evaluation  Patient identified by MRN, date of birth, ID band Patient awake    Reviewed: Allergy & Precautions, H&P , NPO status , Patient's Chart, lab work & pertinent test results, reviewed documented beta blocker date and time   Airway Mallampati: II TM Distance: >3 FB     Dental  (+) Teeth Intact and Poor Dentition   Pulmonary neg pulmonary ROS,    Pulmonary exam normal       Cardiovascular hypertension, Pt. on medications Rhythm:Regular Rate:Normal     Neuro/Psych    GI/Hepatic   Endo/Other  Hyperthyroidism (beta blockers)   Renal/GU Renal disease     Musculoskeletal   Abdominal   Peds  Hematology   Anesthesia Other Findings   Reproductive/Obstetrics                          Anesthesia Physical Anesthesia Plan  ASA: II  Anesthesia Plan: General   Post-op Pain Management:    Induction: Intravenous  Airway Management Planned: Oral ETT  Additional Equipment:   Intra-op Plan:   Post-operative Plan: Extubation in OR  Informed Consent: I have reviewed the patients History and Physical, chart, labs and discussed the procedure including the risks, benefits and alternatives for the proposed anesthesia with the patient or authorized representative who has indicated his/her understanding and acceptance.   Dental advisory given  Plan Discussed with: Anesthesiologist  Anesthesia Plan Comments:        Anesthesia Quick Evaluation

## 2012-10-03 NOTE — Progress Notes (Signed)
Subjective: The patient says "I'm here" when asked how she was doing. No new complaints.  Objective: Vital signs in last 24 hours: Filed Vitals:   10/03/12 0410 10/03/12 0500 10/03/12 0530 10/03/12 0630  BP: 134/82 128/89    Pulse:    64  Temp:      TempSrc:      Resp: 15 12 12 15   Height:      Weight:      SpO2:    99%    Intake/Output Summary (Last 24 hours) at 10/03/12 0813 Last data filed at 10/03/12 0600  Gross per 24 hour  Intake   1340 ml  Output   2870 ml  Net  -1530 ml    Weight change:   Physical exam: General: The patient is a 41 year old African-American woman lying in bed in NAD. Lungs: Clear to auscultation bilaterally. Heart: S1, S2, with no murmurs rubs or gallops. Abdomen: Positive bowel sounds; incision site with bloody drainage, partially obscured by gauze; large indurated area approximating the incision site ; serosanguineous fluid in the JP drain; mildly to moderately diffusely tender over the incision site/postoperative site. Extremities: Left upper extremity with  Trace- 1+ edema, right upper extremity with trace to 1+ edema, and bilateral lower extremities with 1+ bilateral pitting edema.   Lab Results: Basic Metabolic Panel:  Basename 10/03/12 0523 10/02/12 0506  NA 135 138  K 3.7 3.6  CL 100 105  CO2 30 28  GLUCOSE 89 88  BUN 8 7  CREATININE 0.87 0.95  CALCIUM 9.2 8.9  MG -- --  PHOS -- --   Liver Function Tests: No results found for this basename: AST:2,ALT:2,ALKPHOS:2,BILITOT:2,PROT:2,ALBUMIN:2 in the last 72 hours No results found for this basename: LIPASE:2,AMYLASE:2 in the last 72 hours No results found for this basename: AMMONIA:2 in the last 72 hours CBC:  Basename 10/03/12 0523 10/02/12 1752  WBC 8.0 8.8  NEUTROABS -- --  HGB 8.9* 9.6*  HCT 28.4* 30.5*  MCV 81.8 82.0  PLT 256 245   Cardiac Enzymes: No results found for this basename: CKTOTAL:3,CKMB:3,CKMBINDEX:3,TROPONINI:3 in the last 72 hours BNP: No results found  for this basename: PROBNP:3 in the last 72 hours D-Dimer: No results found for this basename: DDIMER:2 in the last 72 hours CBG: No results found for this basename: GLUCAP:6 in the last 72 hours Hemoglobin A1C: No results found for this basename: HGBA1C in the last 72 hours Fasting Lipid Panel: No results found for this basename: CHOL,HDL,LDLCALC,TRIG,CHOLHDL,LDLDIRECT in the last 72 hours Thyroid Function Tests: No results found for this basename: TSH,T4TOTAL,FREET4,T3FREE,THYROIDAB in the last 72 hours Anemia Panel: No results found for this basename: VITAMINB12,FOLATE,FERRITIN,TIBC,IRON,RETICCTPCT in the last 72 hours Coagulation:  Basename 10/03/12 0523 10/02/12 0506  LABPROT 18.3* 23.4*  INR 1.57* 2.19*   Urine Drug Screen: Drugs of Abuse  No results found for this basename: labopia,  cocainscrnur,  labbenz,  amphetmu,  thcu,  labbarb    Alcohol Level: No results found for this basename: ETH:2 in the last 72 hours Urinalysis: No results found for this basename: COLORURINE:2,APPERANCEUR:2,LABSPEC:2,PHURINE:2,GLUCOSEU:2,HGBUR:2,BILIRUBINUR:2,KETONESUR:2,PROTEINUR:2,UROBILINOGEN:2,NITRITE:2,LEUKOCYTESUR:2 in the last 72 hours Misc. Labs:   Micro: Recent Results (from the past 240 hour(s))  URINE CULTURE     Status: Normal   Collection Time   09/23/12  2:25 PM      Component Value Range Status Comment   Specimen Description URINE, CLEAN CATCH   Final    Special Requests NONE   Final    Culture  Setup Time 09/24/2012 01:05  Final    Colony Count NO GROWTH   Final    Culture NO GROWTH   Final    Report Status 09/24/2012 FINAL   Final   CULTURE, BLOOD (ROUTINE X 2)     Status: Normal   Collection Time   09/23/12  8:39 PM      Component Value Range Status Comment   Specimen Description BLOOD LEFT HAND   Final    Special Requests BOTTLES DRAWN AEROBIC AND ANAEROBIC 6CC   Final    Culture NO GROWTH 5 DAYS   Final    Report Status 09/28/2012 FINAL   Final   CULTURE, BLOOD  (ROUTINE X 2)     Status: Normal   Collection Time   09/23/12  8:41 PM      Component Value Range Status Comment   Specimen Description BLOOD LEFT ANTECUBITAL   Final    Special Requests BOTTLES DRAWN AEROBIC AND ANAEROBIC 6CC   Final    Culture NO GROWTH 5 DAYS   Final    Report Status 09/28/2012 FINAL   Final   CULTURE, ROUTINE-GENITAL     Status: Normal   Collection Time   09/23/12 11:50 PM      Component Value Range Status Comment   Specimen Description VAGINA   Final    Special Requests DISCHARGE   Final    Culture NORMAL VAGINAL FLORA   Final    Report Status 09/27/2012 FINAL   Final   MRSA PCR SCREENING     Status: Normal   Collection Time   09/24/12  2:44 AM      Component Value Range Status Comment   MRSA by PCR NEGATIVE  NEGATIVE Final   WET PREP, GENITAL     Status: Abnormal   Collection Time   09/24/12  2:35 PM      Component Value Range Status Comment   Yeast Wet Prep HPF POC NONE SEEN  NONE SEEN Final    Trich, Wet Prep NONE SEEN  NONE SEEN Final    Clue Cells Wet Prep HPF POC RARE (*) NONE SEEN Final    WBC, Wet Prep HPF POC TOO NUMEROUS TO COUNT (*) NONE SEEN Final     Studies/Results: Ct Abdomen Pelvis Wo Contrast  10/02/2012  *RADIOLOGY REPORT*  Clinical Data: Recent surgery.  Questionable hematoma versus infection.  The patient reportedly is on anticoagulants and had leading into the incision.  CT ABDOMEN AND PELVIS WITHOUT CONTRAST  Technique:  Multidetector CT imaging of the abdomen and pelvis was performed following the standard protocol without intravenous contrast.  Comparison: CT, 09/25/2012  Findings: There is minimal dependent lung base subsegmental atelectasis.  A minimal right pleural effusion is present.  The lung bases otherwise clear and the heart is normal in size.  A complex hematoma lies along the anterior lower abdominal wall incision line contiguous with a surgical drain.  It measures approximately 8.6 cm by 6.6 cm x 5.8 cm in size.  There is a small  amount of ascites that lies in the pelvis, between leaves of the mesentery in the lower abdomen and tracks along the pericolic gutters to lie adjacent to the liver and spleen.  More heterogeneous material is also seen in the pelvis above the bladder and vaginal cuff that is likely products hemorrhage.  This is relatively small in amount.  No defined intra-abdominal or pelvic hematoma is seen other than that along the surgical incision. There is a small amount of free intraperitoneal  air.  This is presumed to be postsurgical.  The liver and spleen are unremarkable.  There is some dependent density in the gallbladder that was not present previously.  This may reflect some viscus bile or contrast excreted into the biliary system.  The gallbladder is otherwise unremarkable.  No bile duct dilation.  Normal pancreas. No adrenal masses.  The kidneys, ureters and bladder are unremarkable.  The markedly enlarged uterus and its degenerating fibroid have been removed since the prior study.  There are a few mildly enlarged retroperitoneal lymph nodes, which are stable from prior study.  No other adenopathy.  There is increase stool the colon.  The bowel is otherwise unremarkable.  A normal appendix is visualized.  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  This is stable.  IMPRESSION:   Original Report Authenticated By: Domenic Moras, M.D.     Medications:  Scheduled:    . cefTRIAXone (ROCEPHIN)  IV  1 g Intravenous Q12H  . docusate sodium  100 mg Oral BID  . feeding supplement  237 mL Oral BID BM  . furosemide  40 mg Oral Daily  . multivitamin  5 mL Oral Daily  . pantoprazole  40 mg Oral Q1200  . phytonadione (VITAMIN K) IV  1 mg Intravenous Once  . polyethylene glycol  17 g Oral Daily  . potassium chloride  30 mEq Oral BID  . propranolol  20 mg Oral BID  . sodium chloride      . sodium chloride      . sodium chloride        . sodium chloride       Continuous:    . dextrose 5% lactated ringers with KCl 20 mEq/L 20 mL/hr at 10/02/12 1800   EAV:WUJWJXBJYNW (ZOFRAN) IV, ondansetron, oxyCODONE-acetaminophen, zolpidem  Assessment: Principal Problem:  *Dehydration Active Problems:  Fibroid uterus  Graves disease  Chronic blood loss anemia  Chronic kidney disease  Acute DVT (deep venous thrombosis)  Thyroid mass  Hematoma  Anticoagulation excessive  Peripheral edema  Multinodular goiter  Lupus anticoagulant positive   1. Postoperative bleeding/subcutaneous hematoma in the setting of excessive anticoagulation. Status post IV protamine. Both heparin and warfarin have been discontinued for now per Dr. Emelda Fear. The patient's hemoglobin and hematocrit has fallen a little less than 2 grams in 48 hours. No indication for additional packed red blood cell transfusions yet, but it may be needed during the operation. Planned evacuation of hematoma today.  Large hematoma noted on the CT of the abdomen.  Necrotic uterine fibroid, status post hysterectomy. The patient had been treated with Zosyn, but it was discontinued. In light of the hematoma, Dr. Emelda Fear started Rocephin empirically.  Anemia secondary to chronic blood loss and now acute blood loss. Status post a total of 4 units of packed red blood cell transfusion. As above, her hemoglobin has fallen, but transfusion is not needed yet. We'll continue to monitor.  Bilateral lower extremity DVTs. Her INR is now subtherapeutic. Marland KitchenBecause of the bleeding, heparin and warfarin were discontinued. Hypercoagulable panel results in the setting of acute thromboembolism may not be diagnostic,, but she does have lupus anticoagulant positivity. She will need to have a Hematology followup in the outpatient setting.  Hyperthyroidism/Graves' disease with thyroid mass. Status post biopsy. Per Dr. 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 She will  need to have a total thyroidectomy at a later  date. The patient's TSH and free T4 are within normal limits.  Peripheral edema. Status post IV fluid hydration and 4 units of blood. Her ins and outs are +10+ liters. Her urine output does not appear to be recorded accurately. Nevertheless, with the addition of Lasix, she has some improvement in her peripheral edema.    Plan:  1. Per Dr. Emelda Fear, he will take the patient to the operating room today for evacuation of the abdominal hematoma. 2. Continue to monitor the patient's hemoglobin/CBC at least twice daily. Continue to monitor her INR daily. 3. Hold lasix.     LOS: 10 days   Jessina Marse 10/03/2012, 8:13 AM

## 2012-10-03 NOTE — Preoperative (Addendum)
Beta Blockers   Reason not to administer Beta Blockers: on schedule for prescribed beta blocker 10/02/2012 at  2106

## 2012-10-03 NOTE — Anesthesia Postprocedure Evaluation (Signed)
Anesthesia Post Note  Patient: [name]  Procedure(s) Performed: Procedure(s) (LRB): WOUND EXPLORATION (N/A) EVACUATION HEMATOMA (N/A)  Anesthesia type: General  Patient location: PACU  Post pain: Pain level controlled  Post assessment: Post-op Vital signs reviewed, Patient's Cardiovascular Status Stable, Respiratory Function Stable, Patent Airway, No signs of Nausea or vomiting and Pain level controlled  Last Vitals:  Filed Vitals:   10/03/12 1000  BP: 173/77  Pulse: 67  Temp:   Resp: 11    Post vital signs: Reviewed and stable  Level of consciousness: awake and alert   Complications: No apparent anesthesia complications

## 2012-10-03 NOTE — Transfer of Care (Signed)
Immediate Anesthesia Transfer of Care Note  Patient: [name]  Procedure(s) Performed: Procedure(s) (LRB): WOUND EXPLORATION (N/A) EVACUATION HEMATOMA (N/A)  Patient Location: PACU  Anesthesia Type: General  Level of Consciousness: awake  Airway & Oxygen Therapy: Patient Spontanous Breathing and non-rebreather face mask  Post-op Assessment: Report given to PACU RN, Post -op Vital signs reviewed and stable and Patient moving all extremities  Post vital signs: Reviewed and stable  Complications: No apparent anesthesia complications

## 2012-10-03 NOTE — Progress Notes (Signed)
Day of Surgery Procedure(s) (LRB): WOUND EXPLORATION (N/A)  Subjective: Patient reports a good night.  She has no bleeding vaginally or per incision yet. JP drain in place.  Objective: I have reviewed patient's vital signs, medications and labs. INR barely out at  1.53, after VitK 1 mg yesterday. Ct yesterday reviewed, the fluid in the abdomen is sufficient to warrant opening the abdomen to evacuate.  Patient counselled and agrees with plan.General: alert, cooperative and no distress GI: normal findings: bowel sounds normal and incision intact. JP output minimal at present. Extremities: edema improved  Assessment: s/p Procedure(s) (LRB) with comments: WOUND EXPLORATION (N/A): stable, anemia and ready for surgery. Type and cross 2 units prbc  Ordered.  Plan: NPO to operating room this a.m.  LOS: 10 days    Rasheen Bells V 10/03/2012, 7:43 AM

## 2012-10-03 NOTE — Anesthesia Procedure Notes (Signed)
Procedure Name: Intubation Date/Time: 10/03/2012 8:21 AM Performed by: Franco Nones Pre-anesthesia Checklist: Patient identified, Patient being monitored, Timeout performed, Emergency Drugs available and Suction available Patient Re-evaluated:Patient Re-evaluated prior to inductionOxygen Delivery Method: Circle System Utilized Preoxygenation: Pre-oxygenation with 100% oxygen Intubation Type: IV induction Laryngoscope Size: Miller and 2 Grade View: Grade I Tube type: Oral Tube size: 7.0 mm Number of attempts: 1 Airway Equipment and Method: stylet Placement Confirmation: ETT inserted through vocal cords under direct vision,  positive ETCO2 and breath sounds checked- equal and bilateral Secured at: 21 cm Tube secured with: Tape Dental Injury: Teeth and Oropharynx as per pre-operative assessment

## 2012-10-03 NOTE — Op Note (Signed)
See operative details included in brief operative note 

## 2012-10-04 DIAGNOSIS — D62 Acute posthemorrhagic anemia: Secondary | ICD-10-CM

## 2012-10-04 LAB — BASIC METABOLIC PANEL
CO2: 30 mEq/L (ref 19–32)
Calcium: 8.9 mg/dL (ref 8.4–10.5)
Chloride: 100 mEq/L (ref 96–112)
Glucose, Bld: 89 mg/dL (ref 70–99)
Sodium: 136 mEq/L (ref 135–145)

## 2012-10-04 LAB — CBC
Hemoglobin: 9.2 g/dL — ABNORMAL LOW (ref 12.0–15.0)
MCH: 25.7 pg — ABNORMAL LOW (ref 26.0–34.0)
MCV: 82.1 fL (ref 78.0–100.0)
RBC: 3.58 MIL/uL — ABNORMAL LOW (ref 3.87–5.11)
WBC: 11.6 10*3/uL — ABNORMAL HIGH (ref 4.0–10.5)

## 2012-10-04 LAB — PROTIME-INR: Prothrombin Time: 18.6 seconds — ABNORMAL HIGH (ref 11.6–15.2)

## 2012-10-04 MED ORDER — SODIUM CHLORIDE 0.9 % IJ SOLN
INTRAMUSCULAR | Status: AC
  Administered 2012-10-04: 20 mL
  Filled 2012-10-04: qty 6

## 2012-10-04 MED ORDER — WARFARIN - PHARMACIST DOSING INPATIENT: Freq: Every day | Status: DC

## 2012-10-04 MED ORDER — WARFARIN SODIUM 5 MG PO TABS
5.0000 mg | ORAL_TABLET | Freq: Once | ORAL | Status: AC
  Administered 2012-10-04: 5 mg via ORAL
  Filled 2012-10-04: qty 1

## 2012-10-04 MED ORDER — POTASSIUM CHLORIDE CRYS ER 20 MEQ PO TBCR
20.0000 meq | EXTENDED_RELEASE_TABLET | Freq: Every day | ORAL | Status: DC
  Administered 2012-10-04 – 2012-10-06 (×3): 20 meq via ORAL
  Filled 2012-10-04 (×4): qty 1

## 2012-10-04 MED ORDER — ENOXAPARIN SODIUM 100 MG/ML ~~LOC~~ SOLN
1.0000 mg/kg | Freq: Two times a day (BID) | SUBCUTANEOUS | Status: DC
  Administered 2012-10-04 – 2012-10-06 (×5): 95 mg via SUBCUTANEOUS
  Administered 2012-10-06: 20:00:00 via SUBCUTANEOUS
  Filled 2012-10-04 (×6): qty 1

## 2012-10-04 MED ORDER — SODIUM CHLORIDE 0.9 % IJ SOLN
INTRAMUSCULAR | Status: AC
  Administered 2012-10-05: 05:00:00
  Filled 2012-10-04: qty 3

## 2012-10-04 NOTE — Clinical Social Work Note (Signed)
CSW spoke w patient this AM regarding patient support when discharged to home.  Patient continues to insist that all church members, friends and family are unavailable due to school and work obligations.  States she and husband have discussed issue and agree that they will "have to make the best of it."  CSW will pursue referral to [facility] for possible volunteer support several days/week and has requested that RN CM investigate resources for home health aide support.    Santa Genera, LCSW Clinical Social Worker [phone])

## 2012-10-04 NOTE — Progress Notes (Signed)
OT Cancellation Note  Patient Details Name: [name] MRN: [id number] DOB: [birth year]   Cancelled OT Re-evaluation:    Patient refused OT re-eval due to fatigue. Will re-attempt tomorrow 10/8.  Limmie Patricia, OTR/L 10/04/2012, 3:33 PM

## 2012-10-04 NOTE — Progress Notes (Signed)
I have spoken with Dr. Sherrie Mustache who has approved the resumption of PT.  This will be attempted today.

## 2012-10-04 NOTE — Progress Notes (Signed)
Subjective: The patient denies abdominal pain currently. She did pass flatus, but she has not had a bowel movement since the operation. She denies nausea and vomiting.  Objective: Vital signs in last 24 hours: Filed Vitals:   10/04/12 0400 10/04/12 0500 10/04/12 0600 10/04/12 0814  BP: 110/86 111/77 121/78   Pulse: 82 82 83   Temp: 98.7 F (37.1 C)   98 F (36.7 C)  TempSrc: Oral     Resp: 16 11 12    Height:      Weight:  93.4 kg (205 lb 14.6 oz)    SpO2: 99% 99% 99%     Intake/Output Summary (Last 24 hours) at 10/04/12 0843 Last data filed at 10/04/12 0600  Gross per 24 hour  Intake   2390 ml  Output   2265 ml  Net    125 ml    Weight change:   Physical exam: General: The patient is a 41 year old African-American woman lying in bed in NAD. Lungs: Clear to auscultation bilaterally. Heart: S1, S2, with no murmurs rubs or gallops. Abdomen: Abdominal dressing in place, not taken off. JP draining serosanguineous fluid. Hypoactive bowel sounds. Appropriately tender diffusely. No significant distention. No masses palpated. GU: Foley catheter draining clear yellow urine. Extremities: Trace bilateral upper extremity edema, nearly resolved. Decreased bilateral lower extremity edema of 1+, nonpitting.   Lab Results: Basic Metabolic Panel:  Basename 10/04/12 0450 10/03/12 0523  NA 136 135  K 4.1 3.7  CL 100 100  CO2 30 30  GLUCOSE 89 89  BUN 9 8  CREATININE 1.02 0.87  CALCIUM 8.9 9.2  MG -- --  PHOS -- --   Liver Function Tests: No results found for this basename: AST:2,ALT:2,ALKPHOS:2,BILITOT:2,PROT:2,ALBUMIN:2 in the last 72 hours No results found for this basename: LIPASE:2,AMYLASE:2 in the last 72 hours No results found for this basename: AMMONIA:2 in the last 72 hours CBC:  Basename 10/04/12 0450 10/03/12 0523  WBC 11.6* 8.0  NEUTROABS -- --  HGB 9.2* 8.9*  HCT 29.4* 28.4*  MCV 82.1 81.8  PLT 268 256   Cardiac Enzymes: No results found for this basename:  CKTOTAL:3,CKMB:3,CKMBINDEX:3,TROPONINI:3 in the last 72 hours BNP: No results found for this basename: PROBNP:3 in the last 72 hours D-Dimer: No results found for this basename: DDIMER:2 in the last 72 hours CBG: No results found for this basename: GLUCAP:6 in the last 72 hours Hemoglobin A1C: No results found for this basename: HGBA1C in the last 72 hours Fasting Lipid Panel: No results found for this basename: CHOL,HDL,LDLCALC,TRIG,CHOLHDL,LDLDIRECT in the last 72 hours Thyroid Function Tests: No results found for this basename: TSH,T4TOTAL,FREET4,T3FREE,THYROIDAB in the last 72 hours Anemia Panel: No results found for this basename: VITAMINB12,FOLATE,FERRITIN,TIBC,IRON,RETICCTPCT in the last 72 hours Coagulation:  Basename 10/04/12 0450 10/03/12 0523  LABPROT 18.6* 18.3*  INR 1.61* 1.57*   Urine Drug Screen: Drugs of Abuse  No results found for this basename: labopia,  cocainscrnur,  labbenz,  amphetmu,  thcu,  labbarb    Alcohol Level: No results found for this basename: ETH:2 in the last 72 hours Urinalysis: No results found for this basename: COLORURINE:2,APPERANCEUR:2,LABSPEC:2,PHURINE:2,GLUCOSEU:2,HGBUR:2,BILIRUBINUR:2,KETONESUR:2,PROTEINUR:2,UROBILINOGEN:2,NITRITE:2,LEUKOCYTESUR:2 in the last 72 hours Misc. Labs:   Micro: Recent Results (from the past 240 hour(s))  WET PREP, GENITAL     Status: Abnormal   Collection Time   09/24/12  2:35 PM      Component Value Range Status Comment   Yeast Wet Prep HPF POC NONE SEEN  NONE SEEN Final    Trich, Wet  Prep NONE SEEN  NONE SEEN Final    Clue Cells Wet Prep HPF POC RARE (*) NONE SEEN Final    WBC, Wet Prep HPF POC TOO NUMEROUS TO COUNT (*) NONE SEEN Final     Studies/Results: No results found.  Medications:  Scheduled:    . docusate sodium  100 mg Oral BID  . enoxaparin (LOVENOX) injection  1 mg/kg Subcutaneous Q12H  . feeding supplement  237 mL Oral BID BM  . pantoprazole  40 mg Oral Q1200  .  piperacillin-tazobactam  3.375 g Intravenous Q8H  . polyethylene glycol  17 g Oral Daily  . potassium chloride  30 mEq Oral BID  . propranolol  20 mg Oral BID  . sodium chloride      . sodium chloride      . DISCONTD: cefTRIAXone (ROCEPHIN)  IV  1 g Intravenous Q12H  . DISCONTD: multivitamin  5 mL Oral Daily   Continuous:    . sodium chloride 50 mL/hr at 10/04/12 0600  . DISCONTD: dextrose 5% lactated ringers with KCl 20 mEq/L Stopped (10/03/12 0815)   ZOX:WRUEAVWUJWJXB (DILAUDID) injection, ibuprofen, ondansetron (ZOFRAN) IV, ondansetron, oxyCODONE-acetaminophen, zolpidem, DISCONTD: 0.9 % irrigation (POUR BTL), DISCONTD: bupivacaine  Assessment: Principal Problem:  *Hematoma Active Problems:  Acute DVT (deep venous thrombosis)  Thyroid mass  Multinodular goiter  Lupus anticoagulant positive  Acute blood loss anemia  Fibroid uterus  Graves disease  Chronic blood loss anemia  Chronic kidney disease  Dehydration  Anticoagulation excessive  Peripheral edema   1. Postoperative bleeding/subcutaneous hematoma in the setting of excessive anticoagulation. On the morning of 10/01/2012, the patient began having profuse bleeding around the abdominal pulse operative site. She developed a large wound hematoma, which was picked up clinically and on the followup CT scan of the abdomen. Her PTT was found to be greater than 200. She was given IV protamine. Both heparin and warfarin were discontinued  per Dr. Emelda Fear. She is now postoperative day #1, wound exploration and evacuation of the hematoma per Dr. Emelda Fear. The patient's hemoglobin and hematocrit has fallen a little more than 1 gram in 48 hours. No indication for additional packed red blood cell transfusions yet. Postoperatively, the patient is stable.  Necrotic uterine fibroid, status post hysterectomy. The patient had been treated with Zosyn, but it was discontinued. (Of note, she was given Zosyn perioperatively yesterday). In light of  the hematoma, Dr. Emelda Fear started Rocephin empirically, but has since been discontinued.  Anemia secondary to chronic blood loss and now acute blood loss. Status post a total of 4 units of packed red blood cell transfusion. As above, her hemoglobin has fallen, but transfusion is not needed yet. We'll continue to monitor.  Bilateral lower extremity DVTs. Her INR is now subtherapeutic. Marland KitchenBecause of the bleeding, heparin and warfarin were discontinued. Hypercoagulable panel results in the setting of acute thromboembolism may not be diagnostic,, but she does have lupus anticoagulant positivity. She will need to have a Hematology followup in the outpatient setting.  Hyperthyroidism/Graves' disease with thyroid mass. Status post biopsy. Per Dr. 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 She will need to have a total thyroidectomy at a later date. The patient's TSH and free T4 are within normal limits.  Peripheral edema. Status post IV fluid hydration and 4 units of blood. Her ins and outs are +10+ liters. Her urine output does not appear to be recorded accurately. Nevertheless, with the addition of Lasix for 2 days, she has  some improvement in her peripheral edema. The Lasix has since been discontinued.    Plan:  1. Per Dr. Emelda Fear, Lovenox and Coumadin will be started with the assistance of the pharmacist. 2. Advancement of diet per Dr. Emelda Fear. 3. Discontinue Foley catheter. 4. Transfer patient to regular bed. 5. Out of bed to the chair. 6. Continue to monitor her CBC daily.      LOS: 11 days   Krish Bailly 10/04/2012, 8:43 AM

## 2012-10-04 NOTE — Progress Notes (Signed)
Patient ID: [name], female   DOB: 1971-11-26, 41 y.o.   MRN: [id number]

## 2012-10-04 NOTE — Progress Notes (Signed)
Physical Therapy Treatment Patient Details Name: [name] MRN: [id number] DOB: [birth year] Today's Date: 10/04/2012 Time: 9629-5284 PT Time Calculation (min): 41 min  PT Assessment / Plan / Recommendation Comments on Treatment Session  Pt was seen for a 2nd time today because of poor gait performance this morning.  Her mood seems to be depressed and she is quite fearful of doing anything that might cause her to need more surgery.  I discussed my concerns about her ability to go home at d/c and the need for her to resume more activity.  She was again instructed in transfer in/out of bed and is becoming more proficient with it.  She was able to ambulate about 180' with a RW, good stabiliity.  The activity appeared to improve her mood.  We plan to begin stair training tomorrow.  She should be able to go home at d/c.    Follow Up Recommendations        Does the patient have the potential to tolerate intense rehabilitation     Barriers to Discharge        Equipment Recommendations       Recommendations for Other Services    Frequency Min 5X/week   Plan Discharge plan remains appropriate;Frequency needs to be updated    Precautions / Restrictions     Pertinent Vitals/Pain     Mobility  Bed Mobility Bed Mobility: Rolling Left Rolling Left: 5: Set up Left Sidelying to Sit: 4: Min assist;HOB elevated Sitting - Scoot to Edge of Bed: 6: Modified independent (Device/Increase time) Sit to Supine: 4: Min assist;HOB flat Details for Bed Mobility Assistance: pt instructed in log rolling technique for transfer in/out of bed...transfer is very labored, but she is very fearful of doing anything to "make me have surgery again". Transfers Sit to Stand: 5: Supervision;With upper extremity assist Stand to Sit: 5: Supervision;With upper extremity assist Ambulation/Gait Ambulation/Gait Assistance: 5: Supervision Ambulation Distance (Feet): 180 Feet Assistive device: Rolling  walker Ambulation/Gait Assistance Details: verbal cues to achieve erect posture Gait Pattern: Trunk flexed Gait velocity: slow Stairs: No Wheelchair Mobility Wheelchair Mobility: No    Exercises General Exercises - Lower Extremity Ankle Circles/Pumps: 15 reps;Both Quad Sets: Both;10 reps Gluteal Sets: 10 reps Heel Slides: AAROM;10 reps Hip ABduction/ADduction: Both;10 reps Other Exercises Other Exercises: sit<>stand x5 Other Exercises: standing WS f/b and s/s   PT Diagnosis:    PT Problem List:   PT Treatment Interventions:     PT Goals Acute Rehab PT Goals Pt will go Supine/Side to Sit: with supervision PT Goal: Supine/Side to Sit - Progress: Updated due to goal met Pt will go Sit to Supine/Side: with supervision PT Goal: Sit to Supine/Side - Progress: Updated due to goal met PT Goal: Sit to Stand - Progress: Met PT Goal: Stand to Sit - Progress: Met PT Goal: Ambulate - Progress: Met PT Goal: Up/Down Stairs - Progress: Progressing toward goal  Visit Information  Last PT Received On: 10/04/12    Subjective Data  Subjective: I'm afraid of pulling out my stitches   Cognition  Overall Cognitive Status: Appears within functional limits for tasks assessed/performed Arousal/Alertness: Awake/alert Orientation Level: Appears intact for tasks assessed Behavior During Session: [facility] for tasks performed    Balance     End of Session PT - End of Session Equipment Utilized During Treatment: Gait belt Activity Tolerance: Patient tolerated treatment well Patient left: in bed;with call bell/phone within reach Nurse Communication: Mobility status   GP  Myrlene Broker L 10/04/2012, 2:46 PM

## 2012-10-04 NOTE — Progress Notes (Signed)
Physical Therapy Treatment Patient Details Name: [name] MRN: [id number] DOB: [birth year] Today's Date: 10/04/2012 Time: 2956-2130 PT Time Calculation (min): 25 min 30' therex  PT Assessment / Plan / Recommendation Comments on Treatment Session  Patient moving very slowly however performing all treatment with good technique. Patient needed Mod A from supine<>sit but was able to get EOB with MI. Patient only needed supervision for sit<>stands and 4 L lateral steps to [facility].    Follow Up Recommendations        Does the patient have the potential to tolerate intense rehabilitation     Barriers to Discharge        Equipment Recommendations       Recommendations for Other Services    Frequency     Plan      Precautions / Restrictions     Pertinent Vitals/Pain     Mobility  Bed Mobility Bed Mobility: Rolling Left Rolling Left: 5: Set up Left Sidelying to Sit: 3: Mod assist Sitting - Scoot to Edge of Bed: 6: Modified independent (Device/Increase time) Sit to Supine: 3: Mod assist Details for Bed Mobility Assistance: assistance needed with LEs Transfers Sit to Stand: 5: Supervision;With upper extremity assist;From elevated surface Stand to Sit: 5: Supervision;With upper extremity assist Ambulation/Gait Ambulation/Gait Assistance: 5: Supervision Ambulation Distance (Feet): 3 Feet Assistive device: Rolling walker Ambulation/Gait Assistance Details: 4 lateral steps to the left Gait Pattern: Shuffle Gait velocity: slow;labored Stairs: No Wheelchair Mobility Wheelchair Mobility: No    Exercises General Exercises - Lower Extremity Ankle Circles/Pumps: 15 reps;Both Quad Sets: Both;10 reps Gluteal Sets: 10 reps Heel Slides: AAROM;10 reps Hip ABduction/ADduction: Both;10 reps Other Exercises Other Exercises: sit<>stand x5 Other Exercises: standing WS f/b and s/s   PT Diagnosis:    PT Problem List:   PT Treatment Interventions:     PT Goals    Visit  Information  Last PT Received On: 10/04/12    Subjective Data      Cognition       Balance     End of Session PT - End of Session Equipment Utilized During Treatment: Gait belt Activity Tolerance: Patient tolerated treatment well Patient left: in bed;with call bell/phone within reach   GP     [name] 10/04/2012, 11:38 AM

## 2012-10-04 NOTE — Progress Notes (Signed)
1 Day Post-Op Procedure(s) (LRB): WOUND EXPLORATION (N/A) EVACUATION HEMATOMA (N/A) and 7 days s/p abdominal hysterectomy.   Subjective: Patient reports incisional pain and tolerating PO.desires reg food. Prepared to ambulate.   Will transfer to routine floor. Objective: I have reviewed patient's vital signs, medications and labs. CBC    Component Value Date/Time   WBC 11.6* 10/04/2012 0450   RBC 3.58* 10/04/2012 0450   HGB 9.2* 10/04/2012 0450   HCT 29.4* 10/04/2012 0450   PLT 268 10/04/2012 0450   MCV 82.1 10/04/2012 0450   MCH 25.7* 10/04/2012 0450   MCHC 31.3 10/04/2012 0450   RDW 20.4* 10/04/2012 0450   LYMPHSABS 1.1 08/31/2012 0507   MONOABS 0.6 08/31/2012 0507   EOSABS 0.1 08/31/2012 0507   BASOSABS 0.0 08/31/2012 0507   INR 1.6 General: alert, cooperative and no distress Resp: clear to auscultation bilaterally GI: normal findings: bowel sounds present , but hypoactive, incision: clean, dry, intact and serous drainage present and minimal volume thru Incision JP drain Extremities: edema 3+ stable Vaginal Bleeding: none  Assessment: s/p Procedure(s) (LRB) with comments: WOUND EXPLORATION (N/A) EVACUATION HEMATOMA (N/A): stable, progressing well and tolerating diet  Plan: Advance diet Encourage ambulation discontinue foley Transfer to regular floor Begin anticoagulation, Lovenox 1mg /kg BID, 75 mg bid, and Begin Coumadin as per pharmacy.  LOS: 11 days    [name] V 10/04/2012, 8:22 AM

## 2012-10-04 NOTE — Progress Notes (Signed)
NAME:  [name], [name]              ACCOUNT NO.:  [id number]  MEDICAL RECORD NO.:  [id number]  LOCATION:                                 FACILITY:  PHYSICIAN:  Purcell Nails, MD DATE OF BIRTH:  09-May-1971  DATE OF PROCEDURE: DATE OF DISCHARGE:                                PROGRESS NOTE   REASON FOR FOLLOW UP:  Hyperthyroidism and multinodular goiter.  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  She is eating very well.  She had abnormal biopsy of nodular goiter.  OBJECTIVE:  GENERAL:  She is alert and oriented x3, in no acute distress. CURRENT VITAL SIGNS:  Include blood pressure 116/103, pulse rate 71, temperature 98. HEENT:  Well hydrated.  No pallor.  No icterus. NECK:  Palpable thyroid. CHEST:  Clear to auscultation bilaterally. CARDIOVASCULAR:  Normal S1 and S2.  No murmur.  No gallop. ABDOMEN:  Tender around the wound site.  Bowel sounds positive. EXTREMITIES:  Diminishing edema. CNS:  Nonfocal.  LABORATORY DATA:  Her most recent labs show sodium 136, potassium 4.1, chloride 100, bicarb 30, BUN 9, creatinine 1.02, calcium 8.9.  ASSESSMENT: 1. 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 2. Clinically resolving hyperthyroidism. 3. Status post hysterectomy for necrotic fibroid. 4. Acute deep venous thrombosis. 5. Chronic kidney disease. 6. Chronic blood loss anemia.  PLAN:  The patient will need total thyroidectomy on a later date after her acute other issues are resolved.  The patient will not need intervention for hyperthyroidism at this point, showed significant response to the brief antithyroid drug therapy.          ______________________________ Purcell Nails, MD     GN/MEDQ  D:  10/04/2012  T:  10/04/2012  Job:  [id number]

## 2012-10-04 NOTE — Progress Notes (Signed)
ANTICOAGULATION CONSULT NOTE  Pharmacy Consult for Warfarin Indication:  DVT  No Known Allergies  Patient Measurements: Height: 5\' 9"  (175.3 cm) Weight: 205 lb 14.6 oz (93.4 kg) IBW/kg (Calculated) : 66.2   Vital Signs: Temp: 98 F (36.7 C) (10/07 0814) Temp src: Oral (10/07 0400) BP: 121/78 mmHg (10/07 0600) Pulse Rate: 83  (10/07 0600)  Labs:  Basename 10/04/12 0450 10/03/12 0523 10/02/12 1752 10/02/12 0506 10/01/12 1337  HGB 9.2* 8.9* -- -- --  HCT 29.4* 28.4* 30.5* -- --  PLT 268 256 245 -- --  APTT -- -- -- -- 53*  LABPROT 18.6* 18.3* -- 23.4* --  INR 1.61* 1.57* -- 2.19* --  HEPARINUNFRC -- -- -- -- --  CREATININE 1.02 0.87 -- 0.95 --  CKTOTAL -- -- -- -- --  CKMB -- -- -- -- --  TROPONINI -- -- -- -- --   Estimated Creatinine Clearance: 86.6 ml/min (by C-G formula based on Cr of 1.02).  Medical History: Past Medical History  Diagnosis Date  . Thyroid disease   . Fibroid tumor   . DVT (deep venous thrombosis) 09/30/12    in upper extremities b/l and LLE  . Anemia   . Thyroid nodule     s/p biopsy  . Graves disease   . Multinodular goiter 10/01/2012  . Lupus anticoagulant positive 10/02/2012   Medications:  Scheduled:     . docusate sodium  100 mg Oral BID  . enoxaparin (LOVENOX) injection  1 mg/kg Subcutaneous Q12H  . feeding supplement  237 mL Oral BID BM  . pantoprazole  40 mg Oral Q1200  . piperacillin-tazobactam  3.375 g Intravenous Q8H  . polyethylene glycol  17 g Oral Daily  . potassium chloride  20 mEq Oral Daily  . propranolol  20 mg Oral BID  . sodium chloride      . sodium chloride      . warfarin  5 mg Oral ONCE-1800  . Warfarin - Pharmacist Dosing Inpatient   Does not apply q1800  . DISCONTD: cefTRIAXone (ROCEPHIN)  IV  1 g Intravenous Q12H  . DISCONTD: multivitamin  5 mL Oral Daily  . DISCONTD: potassium chloride  30 mEq Oral BID   Assessment: 41yo female with acute DVT.  Started on IV Heparin then pt developed bleeding around JP  drain and MD ordered Protamine to reverse Heparin.  Pt went back to OR for hematoma removal.  Clinically improved now.  D/W Dr Emelda Fear who wants to resume Coumadin and Lovenox until INR therapeutic.  Goal of Therapy:  Full dose Lovenox until INR therapeutic x 2 days INR 2-3 on Coumadin Monitor platelets by anticoagulation protocol: Yes   Plan: Lovenox 1mg /Kg sq q12hrs until INR therapeutic Coumadin 5mg  today x 1 Daily INR and CBC for now  Valrie Hart A 10/04/2012,10:19 AM

## 2012-10-05 LAB — TYPE AND SCREEN: Unit division: 0

## 2012-10-05 LAB — CBC
HCT: 26.8 % — ABNORMAL LOW (ref 36.0–46.0)
Hemoglobin: 8.5 g/dL — ABNORMAL LOW (ref 12.0–15.0)
MCHC: 31.7 g/dL (ref 30.0–36.0)
RBC: 3.26 MIL/uL — ABNORMAL LOW (ref 3.87–5.11)

## 2012-10-05 LAB — PREPARE RBC (CROSSMATCH)

## 2012-10-05 LAB — PROTIME-INR: INR: 1.72 — ABNORMAL HIGH (ref 0.00–1.49)

## 2012-10-05 MED ORDER — DIPHENHYDRAMINE HCL 25 MG PO CAPS
25.0000 mg | ORAL_CAPSULE | Freq: Once | ORAL | Status: AC
  Administered 2012-10-05: 25 mg via ORAL
  Filled 2012-10-05: qty 1

## 2012-10-05 MED ORDER — WARFARIN SODIUM 5 MG PO TABS
5.0000 mg | ORAL_TABLET | Freq: Once | ORAL | Status: AC
  Administered 2012-10-05: 5 mg via ORAL
  Filled 2012-10-05: qty 1

## 2012-10-05 MED ORDER — FUROSEMIDE 10 MG/ML IJ SOLN
20.0000 mg | Freq: Once | INTRAMUSCULAR | Status: AC
  Administered 2012-10-05: 20 mg via INTRAVENOUS
  Filled 2012-10-05 (×2): qty 2

## 2012-10-05 MED ORDER — ACETAMINOPHEN 325 MG PO TABS
650.0000 mg | ORAL_TABLET | Freq: Once | ORAL | Status: AC
  Administered 2012-10-05: 650 mg via ORAL
  Filled 2012-10-05: qty 2

## 2012-10-05 MED ORDER — SODIUM CHLORIDE 0.9 % IJ SOLN
INTRAMUSCULAR | Status: AC
  Administered 2012-10-05: 10 mL
  Filled 2012-10-05: qty 3

## 2012-10-05 NOTE — Progress Notes (Signed)
Physical Therapy Treatment Patient Details Name: DELAILAH DURGIN MRN: [id number] DOB: 1971/04/27 Today's Date: 10/05/2012 Time: 4098-1191 PT Time Calculation (min): 26 min  PT Assessment / Plan / Recommendation Comments on Treatment Session  Pt has had a busy day, even taking a shower.  Her mood appears to be improved.  She is currently receiving blood.  We concentrated today on gait training, and pt is now able to ambulate with no assist. device...just close supervision.  Gait has no rotational component and is very "protective" in nature and she will problably need an assistive device for functional gait for awhile.  Her primary difficulty is in rising and descending steps.  She has 20 steps into her apartment, no landing.  She was able to negotiate 2 steps today with extreme difficulty.  This is in part due to LE weakness where she cannot lift her body weight with one leg onto the higher step.  We will need to focus on developing LE strength in a closed chain fashion, with empahsis on stair climbing.               Follow Up Recommendations        Does the patient have the potential to tolerate intense rehabilitation     Barriers to Discharge        Equipment Recommendations  Tub/shower bench;3 in 1 bedside comode    Recommendations for Other Services    Frequency     Plan Other (comment) (pt would be best to go into SNF short term)    Precautions / Restrictions Precautions Precautions: Fall Precaution Comments: pt has a drain in abdominal incision   Pertinent Vitals/Pain     Mobility  Bed Mobility Bed Mobility: Supine to Sit Supine to Sit: HOB elevated;5: Supervision Sitting - Scoot to Edge of Bed: 6: Modified independent (Device/Increase time) Transfers Sit to Stand: 5: Supervision;With upper extremity assist;From bed Stand to Sit: 5: Supervision;With upper extremity assist;To bed Ambulation/Gait Ambulation/Gait Assistance: 4: Min guard Ambulation Distance (Feet): 150  Feet Assistive device: None Ambulation/Gait Assistance Details: gait was initially begun using a walker.  It was observed that she did not have to weight bear very much on it, so we transitioned her off of it.  She was able to ambulate with no assist device, but gait has no rotational component and is slow and deliberate.  She reports feeling insecure with gait. Gait Pattern: Trunk flexed;Decreased trunk rotation;Shuffle General Gait Details: we might try to transition pt into gait with a cane Stairs: Yes Stairs Assistance: 3: Mod assist Stairs Assistance Details (indicate cue type and reason): pt had significant difficutlty lifting her body weight onto each step.  She was instructed in gait sequence (i.e. up with the strength, down with the weakness) because her RLE is slightly stronger than the LLE, but climbing 2 steps was EXTREMELY arduous.      Stair Management Technique: One rail Right;Step to pattern;Backwards;Forwards Number of Stairs: 2  Wheelchair Mobility Wheelchair Mobility: No    Exercises     PT Diagnosis:    PT Problem List:   PT Treatment Interventions:     PT Goals Acute Rehab PT Goals PT Goal: Sit to Stand - Progress: Progressing toward goal PT Goal: Stand to Sit - Progress: Progressing toward goal PT Goal: Ambulate - Progress: Progressing toward goal PT Goal: Up/Down Stairs - Progress: Progressing toward goal  Visit Information  Last PT Received On: 10/05/12 Assistance Needed: +1 PT/OT Co-Evaluation/Treatment: Yes    Subjective  Data  Subjective: no c/o   Cognition  Overall Cognitive Status: Appears within functional limits for tasks assessed/performed Arousal/Alertness: Awake/alert Orientation Level: Appears intact for tasks assessed Behavior During Session: Flat affect    Balance     End of Session PT - End of Session Equipment Utilized During Treatment: Gait belt Activity Tolerance: Patient tolerated treatment well Patient left: in bed;with call  bell/phone within reach   GP     Myrlene Broker L 10/05/2012, 4:11 PM

## 2012-10-05 NOTE — Progress Notes (Signed)
ANTICOAGULATION CONSULT NOTE  Pharmacy Consult for Warfarin Indication:  DVT  No Known Allergies  Patient Measurements: Height: 5\' 9"  (175.3 cm) Weight: 201 lb 8 oz (91.4 kg) IBW/kg (Calculated) : 66.2   Vital Signs: Temp: 99.1 F (37.3 C) (10/07 2334) Temp src: Oral (10/07 2334) BP: 118/75 mmHg (10/08 0500) Pulse Rate: 77  (10/08 0500)  Labs:  Basename 10/05/12 0432 10/04/12 0450 10/03/12 0523  HGB 8.5* 9.2* --  HCT 26.8* 29.4* 28.4*  PLT 264 268 256  APTT -- -- --  LABPROT 19.6* 18.6* 18.3*  INR 1.72* 1.61* 1.57*  HEPARINUNFRC -- -- --  CREATININE -- 1.02 0.87  CKTOTAL -- -- --  CKMB -- -- --  TROPONINI -- -- --   Estimated Creatinine Clearance: 85.7 ml/min (by C-G formula based on Cr of 1.02).  Medical History: Past Medical History  Diagnosis Date  . Thyroid disease   . Fibroid tumor   . DVT (deep venous thrombosis) 09/30/12    in upper extremities b/l and LLE  . Anemia   . Thyroid nodule     s/p biopsy  . Graves disease   . Multinodular goiter 10/01/2012  . Lupus anticoagulant positive 10/02/2012   Medications:  Scheduled:     . docusate sodium  100 mg Oral BID  . enoxaparin (LOVENOX) injection  1 mg/kg Subcutaneous Q12H  . feeding supplement  237 mL Oral BID BM  . pantoprazole  40 mg Oral Q1200  . polyethylene glycol  17 g Oral Daily  . potassium chloride  20 mEq Oral Daily  . propranolol  20 mg Oral BID  . sodium chloride      . sodium chloride      . warfarin  5 mg Oral ONCE-1800  . warfarin  5 mg Oral ONCE-1800  . Warfarin - Pharmacist Dosing Inpatient   Does not apply q1800  . DISCONTD: potassium chloride  30 mEq Oral BID   Assessment: 41yo female with acute DVT.  Initially during admission pt was started on IV Heparin then pt developed bleeding around JP drain and MD ordered Protamine to reverse Heparin.  Pt went back to OR for hematoma removal.  Clinically improved now.  D/W Dr Emelda Fear who wants to resume Coumadin and Lovenox until INR  therapeutic.  Goal of Therapy:  Full dose Lovenox with Coumadin x 5 days minimum and until INR therapeutic x 2 days INR 2-3 on Coumadin Monitor platelets by anticoagulation protocol: Yes   Plan: Lovenox 1mg /Kg sq q12hrs until INR therapeutic Coumadin 5mg  today x 1 Daily INR and CBC for now  Valrie Hart A 10/05/2012,9:08 AM

## 2012-10-05 NOTE — Progress Notes (Signed)
Report given to Wynona Canes, Charity fundraiser. Patient being transferred to 2A. Blood infusing, patient alert, oriented and in stable condition at the time of transport.

## 2012-10-05 NOTE — Progress Notes (Signed)
[facility]             Progress Note   Subjective: She does not have any specific complaints today.  She reports good po intake.  She does feel weak and somewhat short of breath on ambulation.  She has not noted any further bleeding.  She is looking forward to going home.  Objective: Vital signs in last 24 hours: Temp:  [98.1 F (36.7 C)-99.1 F (37.3 C)] 99.1 F (37.3 C) (10/07 2334) Pulse Rate:  [72-103] 86  (10/08 0934) Resp:  [11-18] 11  (10/08 0500) BP: (102-130)/(62-103) 111/63 mmHg (10/08 0934) SpO2:  [93 %-100 %] 98 % (10/08 0300) Weight:  [91.4 kg (201 lb 8 oz)] 91.4 kg (201 lb 8 oz) (10/08 0500) Weight change: -2 kg (-4 lb 6.5 oz) Last BM Date: 10/04/12  Intake/Output from previous day: 10/07 0701 - 10/08 0700 In: 1550 [P.O.:480; I.V.:1070] Out: 790 [Urine:750; Drains:40] Total I/O In: 240 [P.O.:240] Out: 200 [Urine:200]   Physical Exam: General: Alert, awake, oriented x3, in no acute distress. HEENT: No bruits, no goiter. Heart: Regular rate and rhythm, without murmurs, rubs, gallops. Lungs: Clear to auscultation bilaterally. Abdomen: Soft, nontender, nondistended, positive bowel sounds. Extremities: 2+ pedal edema b/l Neuro: Grossly intact, nonfocal.    Lab Results: Basic Metabolic Panel:  Basename 10/04/12 0450 10/03/12 0523  NA 136 135  K 4.1 3.7  CL 100 100  CO2 30 30  GLUCOSE 89 89  BUN 9 8  CREATININE 1.02 0.87  CALCIUM 8.9 9.2  MG -- --  PHOS -- --   Liver Function Tests: No results found for this basename: AST:2,ALT:2,ALKPHOS:2,BILITOT:2,PROT:2,ALBUMIN:2 in the last 72 hours No results found for this basename: LIPASE:2,AMYLASE:2 in the last 72 hours No results found for this basename: AMMONIA:2 in the last 72 hours CBC:  Basename 10/05/12 0432 10/04/12 0450  WBC 10.0 11.6*  NEUTROABS -- --  HGB 8.5* 9.2*  HCT 26.8* 29.4*  MCV 82.2 82.1  PLT 264 268   Cardiac Enzymes: No results found for this basename:  CKTOTAL:3,CKMB:3,CKMBINDEX:3,TROPONINI:3 in the last 72 hours BNP: No results found for this basename: PROBNP:3 in the last 72 hours D-Dimer: No results found for this basename: DDIMER:2 in the last 72 hours CBG: No results found for this basename: GLUCAP:6 in the last 72 hours Hemoglobin A1C: No results found for this basename: HGBA1C in the last 72 hours Fasting Lipid Panel: No results found for this basename: CHOL,HDL,LDLCALC,TRIG,CHOLHDL,LDLDIRECT in the last 72 hours Thyroid Function Tests: No results found for this basename: TSH,T4TOTAL,FREET4,T3FREE,THYROIDAB in the last 72 hours Anemia Panel: No results found for this basename: VITAMINB12,FOLATE,FERRITIN,TIBC,IRON,RETICCTPCT in the last 72 hours Coagulation:  Basename 10/05/12 0432 10/04/12 0450  LABPROT 19.6* 18.6*  INR 1.72* 1.61*   Urine Drug Screen: Drugs of Abuse  No results found for this basename: labopia, cocainscrnur, labbenz, amphetmu, thcu, labbarb    Alcohol Level: No results found for this basename: ETH:2 in the last 72 hours Urinalysis: No results found for this basename: COLORURINE:2,APPERANCEUR:2,LABSPEC:2,PHURINE:2,GLUCOSEU:2,HGBUR:2,BILIRUBINUR:2,KETONESUR:2,PROTEINUR:2,UROBILINOGEN:2,NITRITE:2,LEUKOCYTESUR:2 in the last 72 hours  No results found for this or any previous visit (from the past 240 hour(s)).  Studies/Results: No results found.  Medications: Scheduled Meds:   . acetaminophen  650 mg Oral Once  . diphenhydrAMINE  25 mg Oral Once  . docusate sodium  100 mg Oral BID  . enoxaparin (LOVENOX) injection  1 mg/kg Subcutaneous Q12H  . feeding supplement  237 mL Oral BID BM  . pantoprazole  40 mg Oral Q1200  .  polyethylene glycol  17 g Oral Daily  . potassium chloride  20 mEq Oral Daily  . propranolol  20 mg Oral BID  . sodium chloride      . sodium chloride      . warfarin  5 mg Oral ONCE-1800  . warfarin  5 mg Oral ONCE-1800  . Warfarin - Pharmacist Dosing Inpatient   Does not apply  q1800   Continuous Infusions:   . sodium chloride 50 mL/hr at 10/05/12 0937   PRN Meds:.HYDROmorphone (DILAUDID) injection, ibuprofen, ondansetron (ZOFRAN) IV, ondansetron, oxyCODONE-acetaminophen, zolpidem  Assessment/Plan:  Principal Problem:  *Hematoma Active Problems:  Fibroid uterus  Graves disease  Chronic blood loss anemia  Chronic kidney disease  Dehydration  Acute DVT (deep venous thrombosis)  Thyroid mass  Anticoagulation excessive  Peripheral edema  Multinodular goiter  Lupus anticoagulant positive  Acute blood loss anemia  #1. Postoperative bleeding/subcutaneous hematoma in the setting of excessive anticoagulation. Patient unfortunately developed a postoperative bleeding after having a hysterectomy done, in the setting of excessive anticoagulation. She underwent wound exploration and evacuation by Dr. Emelda Fear and is now postoperative day 2. She does not have any further signs of bleeding. She's been restarted on anticoagulation.  2. Acute on chronic anemia, due to acute and chronic blood loss. Patient's current hemoglobin is 8.5. She does describe some mild symptoms of anemia with generalized weakness and shortness of breath. Plans are to transfuse her 2 units of PRBCs today. She will need Lasix after transfusion.  #3. Bilateral lower extremity DVTs. Patient is currently on anticoagulation. She does have an abnormal hypercoagulable panel with positive lupus anticoagulant. She will need followup with hematology in the outpatient setting.  #4. Necrotic uterine fibroid status post hysterectomy. She's currently off antibiotics. Dr. Emelda Fear is following.  #5. Hypothyroidism/Graves' disease with thyroid mass. She will need follow up with Dr. Fransico Him as an outpatient. Per Dr. Emelda Fear, she'll be referred to Dr. Lovell Sheehan for outpatient thyroidectomy.   #6. Generalized deconditioning due to acute illness. Patient is working with physical therapy and is improving. Hopefully she  will be able to return home.  #7. Peripheral edema likely secondary to underlying DVTs and hypoalbuminemia. She was given Lasix with improvement. She's been encouraged to ambulate and keep her legs elevated.  #8. Disposition. Anticipate she should be able to discharge home once her INR is therapeutic, providing she doesn't have any further complications due to anticoagulation.     Time spent coordinating care: 25 mins   LOS: 12 days   Adian Jablonowski [facility] Pager: [phone] 10/05/2012, 9:40 AM

## 2012-10-05 NOTE — Evaluation (Signed)
Occupational Therapy Evaluation Patient Details Name: [name] MRN: [id number] DOB: 20-Dec-1971 Today's Date: 10/05/2012 Time: 4098-1191 OT Time Calculation (min): 19 min  OT Assessment / Plan / Recommendation Clinical Impression  Patient is 41 y/o female s/p Hematoma presenting to acute OT for a re-eval per PT as patient was exerperiencing increased Bil weakness. Pt will benefit from OT services to increase ADL performance, Bil UE strength and endurance,. Recommend tub transfer bench and 3-in-1 commode at D/C. Will practice tub/shower transfer tomorrow. Recommend Home health OT. Wondering if CIR may be a possibility before D/C home due to 20 steps at home. Pt only able to do 2 steps with PT today.    OT Assessment  Patient needs continued OT Services    Follow Up Recommendations  Inpatient Rehab;Home health OT;Supervision/Assistance - 24 hour    Barriers to Discharge Decreased caregiver support    Equipment Recommendations  Tub/shower bench;3 in 1 bedside comode    Recommendations for Other Services Rehab consult  Frequency  Min 2X/week    Precautions / Restrictions Precautions Precautions: Fall Precaution Comments: pt has a drain in abdominal incision   Pertinent Vitals/Pain No report of pain.    ADL  Lower Body Dressing: Performed;Supervision/safety (needed increased time and vc's for technique.) Where Assessed - Lower Body Dressing: Supported sit to stand Toilet Transfer: Performed;Supervision/safety;Other (comment) (increased time) Transfers/Ambulation Related to ADLs: pt transfers at Supervision level.    OT Diagnosis: Generalized weakness  OT Problem List: Decreased strength;Decreased activity tolerance;Decreased coordination;Impaired balance (sitting and/or standing);Decreased knowledge of use of DME or AE OT Treatment Interventions: Self-care/ADL training;DME and/or AE instruction;Therapeutic exercise;Energy conservation;Therapeutic activities;Balance  training;Patient/family education   OT Goals Acute Rehab OT Goals OT Goal Formulation: With patient Time For Goal Achievement: 10/19/12 Potential to Achieve Goals: Good ADL Goals Pt Will Perform Upper Body Bathing: with modified independence;Sitting in shower ADL Goal: Upper Body Bathing - Progress: Goal set today Pt Will Perform Lower Body Bathing: with supervision;Sit to stand in shower ADL Goal: Lower Body Bathing - Progress: Goal set today Pt Will Perform Upper Body Dressing: with modified independence;Sitting, bed ADL Goal: Upper Body Dressing - Progress: Goal set today Pt Will Perform Lower Body Dressing: with supervision;Sit to stand from bed ADL Goal: Lower Body Dressing - Progress: Goal set today Pt Will Transfer to Toilet: 3-in-1;Ambulation;with supervision ADL Goal: Toilet Transfer - Progress: Goal set today Pt Will Perform Toileting - Clothing Manipulation: with modified independence;Standing;Sitting on 3-in-1 or toilet ADL Goal: Toileting - Clothing Manipulation - Progress: Goal set today Pt Will Perform Toileting - Hygiene: with modified independence;Sit to stand from 3-in-1/toilet;Leaning right and/or left on 3-in-1/toilet;Standing at 3-in-1/toilet ADL Goal: Toileting - Hygiene - Progress: Goal set today Pt Will Perform Tub/Shower Transfer: Tub transfer;with supervision;Ambulation;Transfer tub bench ADL Goal: Tub/Shower Transfer - Progress: Goal set today Arm Goals Pt Will Perform AROM: with supervision, verbal cues required/provided;Bilateral upper extremities;1 set;10 reps;Other (comment) (to increase strength; isometric; own body weight) Arm Goal: AROM - Progress: Goal set today  Visit Information  Last OT Received On: 10/05/12 Assistance Needed: +1 PT/OT Co-Evaluation/Treatment: Yes    Subjective Data  Subjective: "I took a shower this morning." Patient Stated Goal: To go home.   Prior Functioning     Home Living Lives With: Spouse Available Help at  Discharge: Available PRN/intermittently;Family Type of Home: Apartment Home Access: Stairs to enter [employer] of Steps: 20 Entrance Stairs-Rails: Right Home Layout: One level Bathroom Shower/Tub: Engineer, manufacturing systems: Standard Prior Function Level  of Independence: Independent Able to Take Stairs?: Yes Driving: Yes Vocation: Unemployed Communication Communication: No difficulties Dominant Hand: Right            Cognition  Overall Cognitive Status: Appears within functional limits for tasks assessed/performed Arousal/Alertness: Awake/alert Orientation Level: Appears intact for tasks assessed Behavior During Session: Flat affect    Extremity/Trunk Assessment Right Upper Extremity Assessment RUE ROM/Strength/Tone: Deficits RUE ROM/Strength/Tone Deficits: A/ROM WFL in all ranges. MMT: 3/5 RUE Coordination: Deficits RUE Coordination Deficits: impaired gross/fine motor  Left Upper Extremity Assessment LUE ROM/Strength/Tone: Deficits LUE ROM/Strength/Tone Deficits: A/ROM WFL in all ranges. MMT: 3/5 LUE Sensation: WFL - Light Touch;WFL - Proprioception LUE Coordination: Deficits LUE Coordination Deficits: Impaired gross/fine motor      Mobility Bed Mobility Bed Mobility: Supine to Sit Supine to Sit: HOB elevated;5: Supervision Sitting - Scoot to Edge of Bed: 6: Modified independent (Device/Increase time) Transfers Sit to Stand: 5: Supervision;With upper extremity assist;From bed Stand to Sit: 5: Supervision;With upper extremity assist;To bed              End of Session OT - End of Session Equipment Utilized During Treatment: Gait belt Activity Tolerance: Patient limited by fatigue;Patient tolerated treatment well Patient left: in bed;with call bell/phone within reach;with family/visitor present;Other (comment) (with PT)    Limmie Patricia, OTR/L 10/05/2012, 4:05 PM
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
## 2012-10-05 NOTE — Progress Notes (Signed)
Nutrition Follow-up  Intervention: Continue Ensure Complete BID Encourage oral intake meals and oral supplement daily  Rec she cont Ensure  BID after d/c to promote repletion of nutritional status  Assessment:  Pt transferred out of ICU today. She reports improved appetite; which is reflected in meal intake records 50-75% of recent meals and she also likes the Ensure Complete.   Diet Order: Regular  Meds: Scheduled Meds:   . acetaminophen  650 mg Oral Once  . diphenhydrAMINE  25 mg Oral Once  . docusate sodium  100 mg Oral BID  . enoxaparin (LOVENOX) injection  1 mg/kg Subcutaneous Q12H  . feeding supplement  237 mL Oral BID BM  . furosemide  20 mg Intravenous Once  . pantoprazole  40 mg Oral Q1200  . polyethylene glycol  17 g Oral Daily  . potassium chloride  20 mEq Oral Daily  . propranolol  20 mg Oral BID  . sodium chloride      . sodium chloride      . warfarin  5 mg Oral ONCE-1800  . warfarin  5 mg Oral ONCE-1800  . Warfarin - Pharmacist Dosing Inpatient   Does not apply q1800   Continuous Infusions:   . sodium chloride 20 mL/hr at 10/05/12 1018   PRN Meds:.HYDROmorphone (DILAUDID) injection, ibuprofen, ondansetron (ZOFRAN) IV, ondansetron, oxyCODONE-acetaminophen, zolpidem  Labs:  CMP     Component Value Date/Time   NA 136 10/04/2012 0450   K 4.1 10/04/2012 0450   CL 100 10/04/2012 0450   CO2 30 10/04/2012 0450   GLUCOSE 89 10/04/2012 0450   BUN 9 10/04/2012 0450   CREATININE 1.02 10/04/2012 0450   CALCIUM 8.9 10/04/2012 0450   PROT 6.8 09/28/2012 0439   ALBUMIN 1.5* 09/28/2012 0439   AST 8 09/28/2012 0439   ALT 6 09/28/2012 0439   ALKPHOS 78 09/28/2012 0439   BILITOT 0.7 09/28/2012 0439   GFRNONAA 66* 10/04/2012 0450   GFRAA 77* 10/04/2012 0450     Intake/Output Summary (Last 24 hours) at 10/05/12 1507 Last data filed at 10/05/12 1221  Gross per 24 hour  Intake   1719 ml  Output    590 ml  Net   1129 ml    Weight Status: (10/2 wt 93.8 kg)  Wt Readings from  Last 10 Encounters:  10/05/12 201 lb 8 oz (91.4 kg)  10/05/12 201 lb 8 oz (91.4 kg)  10/05/12 201 lb 8 oz (91.4 kg)  08/31/12 235 lb 0.2 oz (106.6 kg)  07/20/12 242 lb 15.2 oz (110.2 kg)  06/12/12 242 lb 15.2 oz (110.2 kg)  04/24/10 270 lb (122.471 kg)  11/06/08 260 lb 12 oz (118.275 kg)    Re-estimated needs: Kcal: 1700-1900  Protein:95-110 gr  Fluid:1 ml/kcal   Nutrition Dx: Inadequate oral intake improving  Goal: Pt to meet >/= 90% of their estimated nutrition needs; progressing  Monitor: Diet tolerance, % meal , supplement intake and labs    [phone]

## 2012-10-05 NOTE — Plan of Care (Signed)
Problem: Phase II Progression Outcomes Goal: Pain controlled Outcome: Progressing Pain continues tonight, intermiittent rated 9 out of 10, abdominal. Percocet tablets given Goal: Progress activity as tolerated unless otherwise ordered Outcome: Progressing OOB to bsc with standby assist.

## 2012-10-05 NOTE — Progress Notes (Signed)
UR Chart Review Completed  

## 2012-10-05 NOTE — Progress Notes (Signed)
2 Days Post-Op Procedure(s) (LRB): WOUND EXPLORATION (N/A) EVACUATION HEMATOMA (N/A)  Subjective: Patient reports tolerating PO, + flatus and no problems voiding.  She has fatigue, is significantly deconditioned, is able to make it into chair with assistance, has bedside commode. Social work efforts to be sure she has some support appreciated.  Her husband has been unavailable for her support in the past, and patient is reluctant to seek necessary help in past, resulting in missed appts and incomplete therapies when outpatient care arranged.  Objective: I have reviewed patient's vital signs, medications, labs and . BP 118/75  Pulse 77  Temp 99.1 F (37.3 C) (Oral)  Resp 11  Ht 5\' 9"  (1.753 m)  Wt 91.4 kg (201 lb 8 oz)  BMI 29.76 kg/m2  SpO2 98%  LMP 09/18/2012  Physical Examination: General appearance - alert,, and in no distress and fatigued, oriented,denies pain Chest - clear to auscultation, no wheezes, rales or rhonchi, symmetric air entry Abdomen - soft, nontender, nondistended, no masses or organomegaly Incision soft, dressing dry,  Extremities - , pedal edema 3+ nontender. JP drain output 40 cc CBC    Component Value Date/Time   WBC 10.0 10/05/2012 0432   RBC 3.26* 10/05/2012 0432   HGB 8.5* 10/05/2012 0432   HCT 26.8* 10/05/2012 0432   PLT 264 10/05/2012 0432   MCV 82.2 10/05/2012 0432   MCH 26.1 10/05/2012 0432   MCHC 31.7 10/05/2012 0432   RDW 20.7* 10/05/2012 0432   LYMPHSABS 1.1 08/31/2012 0507   MONOABS 0.6 08/31/2012 0507   EOSABS 0.1 08/31/2012 0507   BASOSABS 0.0 08/31/2012 0507   INR  1.72  PT 19.6    Assessment: s/p Procedure(s) (LRB) with comments: WOUND EXPLORATION (N/A) EVACUATION HEMATOMA (N/A): stable and anemia 1. Postoperative bleeding/subcutaneous hematoma in the setting of excessive anticoagulation Anemia secondary to chronic blood loss and now acute blood loss   Hgb decrease not felt to represent continued blood loss, but re equilibration.  She is  certainly iron depleted, and will have slower recovery due to overall poor health. I intend to transfuse 2 U PRBC to get hgb above 10. 2. DVT: On lovenox at 95 mg bid, and Coumadin 5 mg/d per pharmacy. Since DVT's were likely nearly a month old, I agree with a slow achievement of full anticoagulation in light of prior incisional hematoma. 3.Hyperthyroidism/Graves' disease with thyroid mass Patient will be referred to Dr. Lovell Sheehan once recovered from current surgery. Case discussed with he and Dr Fransico Him who agree with this plan.     Plan: Encourage ambulation Transfuse 2 Units prbc today Continue anticoagulation Social work assessing home care options.  LOS: 12 days    Dailon Sheeran V 10/05/2012, 8:39 AM

## 2012-10-06 LAB — CBC
HCT: 29.8 % — ABNORMAL LOW (ref 36.0–46.0)
MCH: 27.1 pg (ref 26.0–34.0)
MCV: 83.2 fL (ref 78.0–100.0)
Platelets: 265 10*3/uL (ref 150–400)
RDW: 19.2 % — ABNORMAL HIGH (ref 11.5–15.5)

## 2012-10-06 LAB — TYPE AND SCREEN
ABO/RH(D): O POS
Antibody Screen: NEGATIVE
Unit division: 0
Unit division: 0

## 2012-10-06 LAB — PROTIME-INR
INR: 1.92 — ABNORMAL HIGH (ref 0.00–1.49)
Prothrombin Time: 21.2 s — ABNORMAL HIGH (ref 11.6–15.2)

## 2012-10-06 MED ORDER — SODIUM CHLORIDE 0.9 % IV SOLN
1020.0000 mg | Freq: Once | INTRAVENOUS | Status: AC
  Administered 2012-10-06: 1020 mg via INTRAVENOUS
  Filled 2012-10-06: qty 34

## 2012-10-06 MED ORDER — WARFARIN SODIUM 5 MG PO TABS
5.0000 mg | ORAL_TABLET | Freq: Once | ORAL | Status: AC
  Administered 2012-10-06: 5 mg via ORAL
  Filled 2012-10-06: qty 1

## 2012-10-06 NOTE — Progress Notes (Signed)
3 Days Post-Op Procedure(s) (LRB): WOUND EXPLORATION (N/A) EVACUATION HEMATOMA (N/A)  Subjective: Patient reports tolerating PO and + flatus.  Good response to lasix.   Objective: I have reviewed patient's vital signs, intake and output and labs.  Physical Examination: General appearance - alert, well appearing, and in no distress and comfortable. Mental status - alert, oriented to person, place, and time, less somber.  Appropriate mood Chest - clear to auscultation, no wheezes, rales or rhonchi, symmetric air entry Abdomen - soft, nontender, nondistended, no masses or organomegaly                    Incision clean dry, intact Extremities - upper extremity edema markedly improved, still bilateral 3+ edema, left greater than right.   Assessment: s/p Procedure(s) (LRB) with comments: WOUND EXPLORATION (N/A) EVACUATION HEMATOMA (N/A): stable, progressing well and tolerating diet Anemia:  Hgb still low at 9.5 after 2 units. Pt is a candidate for IV iron later today  Plan: Social services working on home support options. IV iron today Possible d/c home if home support able to be arranged   LOS: 13 days    Dareth Andrew V 10/06/2012, 7:18 AM

## 2012-10-06 NOTE — Plan of Care (Signed)
Problem: Phase III Progression Outcomes Goal: Ambulates without assistance Outcome: Not Met (add Reason) Ambulated in hallway today with Physical Therapy using a walker,patient did well

## 2012-10-06 NOTE — Progress Notes (Signed)
[facility]             Progress Note   Subjective: Patient sitting up on side of bed.  Complaining of pain in her operation site. No significant bleeding reported. No new complaints today.  Objective: Vital signs in last 24 hours: Temp:  [97.2 F (36.2 C)-98.5 F (36.9 C)] 97.2 F (36.2 C) (10/09 0538) Pulse Rate:  [79-91] 80  (10/09 0538) Resp:  [12-19] 18  (10/09 0538) BP: (106-127)/(68-85) 118/79 mmHg (10/09 0538) SpO2:  [97 %-99 %] 99 % (10/09 0538) Weight change:  Last BM Date: 10/04/12  Intake/Output from previous day: 10/08 0701 - 10/09 0700 In: 999 [P.O.:720; I.V.:279] Out: 230 [Urine:200; Drains:30]     Physical Exam: General: Alert, awake, oriented x3, in no acute distress. HEENT: No bruits, no goiter. Heart: Regular rate and rhythm, without murmurs, rubs, gallops. Lungs: Clear to auscultation bilaterally. Abdomen: Soft, nontender, positive bowel sounds. JP drain in place Extremities: 2+ pedal edema b/lin LE, improved in UE Neuro: Grossly intact, nonfocal.    Lab Results: Basic Metabolic Panel:  Basename 10/04/12 0450  NA 136  K 4.1  CL 100  CO2 30  GLUCOSE 89  BUN 9  CREATININE 1.02  CALCIUM 8.9  MG --  PHOS --   Liver Function Tests: No results found for this basename: AST:2,ALT:2,ALKPHOS:2,BILITOT:2,PROT:2,ALBUMIN:2 in the last 72 hours No results found for this basename: LIPASE:2,AMYLASE:2 in the last 72 hours No results found for this basename: AMMONIA:2 in the last 72 hours CBC:  Basename 10/06/12 0523 10/05/12 0432  WBC 6.8 10.0  NEUTROABS -- --  HGB 9.7* 8.5*  HCT 29.8* 26.8*  MCV 83.2 82.2  PLT 265 264   Cardiac Enzymes: No results found for this basename: CKTOTAL:3,CKMB:3,CKMBINDEX:3,TROPONINI:3 in the last 72 hours BNP: No results found for this basename: PROBNP:3 in the last 72 hours D-Dimer: No results found for this basename: DDIMER:2 in the last 72 hours CBG: No results found for this basename: GLUCAP:6  in the last 72 hours Hemoglobin A1C: No results found for this basename: HGBA1C in the last 72 hours Fasting Lipid Panel: No results found for this basename: CHOL,HDL,LDLCALC,TRIG,CHOLHDL,LDLDIRECT in the last 72 hours Thyroid Function Tests: No results found for this basename: TSH,T4TOTAL,FREET4,T3FREE,THYROIDAB in the last 72 hours Anemia Panel: No results found for this basename: VITAMINB12,FOLATE,FERRITIN,TIBC,IRON,RETICCTPCT in the last 72 hours Coagulation:  Basename 10/06/12 0523 10/05/12 0432  LABPROT 21.2* 19.6*  INR 1.92* 1.72*   Urine Drug Screen: Drugs of Abuse  No results found for this basename: labopia,  cocainscrnur,  labbenz,  amphetmu,  thcu,  labbarb    Alcohol Level: No results found for this basename: ETH:2 in the last 72 hours Urinalysis: No results found for this basename: COLORURINE:2,APPERANCEUR:2,LABSPEC:2,PHURINE:2,GLUCOSEU:2,HGBUR:2,BILIRUBINUR:2,KETONESUR:2,PROTEINUR:2,UROBILINOGEN:2,NITRITE:2,LEUKOCYTESUR:2 in the last 72 hours  No results found for this or any previous visit (from the past 240 hour(s)).  Studies/Results: No results found.  Medications: Scheduled Meds:    . acetaminophen  650 mg Oral Once  . diphenhydrAMINE  25 mg Oral Once  . docusate sodium  100 mg Oral BID  . enoxaparin (LOVENOX) injection  1 mg/kg Subcutaneous Q12H  . feeding supplement  237 mL Oral BID BM  . ferumoxytol  1,020 mg Intravenous Once  . furosemide  20 mg Intravenous Once  . pantoprazole  40 mg Oral Q1200  . polyethylene glycol  17 g Oral Daily  . potassium chloride  20 mEq Oral Daily  . propranolol  20 mg Oral BID  . sodium  chloride      . warfarin  5 mg Oral ONCE-1800  . warfarin  5 mg Oral ONCE-1800  . Warfarin - Pharmacist Dosing Inpatient   Does not apply q1800   Continuous Infusions:    . sodium chloride 20 mL/hr at 10/05/12 1018   PRN Meds:.HYDROmorphone (DILAUDID) injection, ibuprofen, ondansetron (ZOFRAN) IV, ondansetron,  oxyCODONE-acetaminophen, zolpidem  Assessment/Plan:  Principal Problem:  *Hematoma Active Problems:  Fibroid uterus  Graves disease  Chronic blood loss anemia  Chronic kidney disease  Dehydration  Acute DVT (deep venous thrombosis)  Thyroid mass  Anticoagulation excessive  Peripheral edema  Multinodular goiter  Lupus anticoagulant positive  Acute blood loss anemia  #1. Postoperative bleeding/subcutaneous hematoma in the setting of excessive anticoagulation. Patient unfortunately developed postoperative bleeding after having a hysterectomy done, in the setting of excessive anticoagulation. She underwent wound exploration and evacuation by Dr. Emelda Fear and is now postoperative day 3. She does not have any further signs of bleeding. She's been restarted on anticoagulation. JP drain in place, removal deferred to Dr. Emelda Fear.  #2. Acute on chronic anemia, due to acute and chronic blood loss. Patient transfused 2 unit prbc yesterday.  Her hemoglobin is improved today.  Plans for IV iron today noted.  #3. Bilateral lower extremity DVTs. Patient is currently on anticoagulation. She does have an abnormal hypercoagulable panel with positive lupus anticoagulant. She will need followup with hematology in the outpatient setting. INR today is 1.9.  I anticipate that she will be therapeutic tomorrow and we will be able to discontinue lovenox.  #4. Necrotic uterine fibroid status post hysterectomy. She's currently off antibiotics. Dr. Emelda Fear is following.  #5. Hyperthyroidism/Graves' disease with thyroid mass. She will need follow up with Dr. Fransico Him as an outpatient. Per Dr. Emelda Fear, she'll be referred to Dr. Lovell Sheehan for outpatient thyroidectomy.   #6. Generalized deconditioning due to acute illness. Patient is working with physical therapy and is improving. Hopefully she will be able to return home.  #7. Peripheral edema likely secondary to underlying DVTs and hypoalbuminemia. She was given Lasix  with improvement. She's been encouraged to ambulate and keep her legs elevated.  #8. Disposition. Anticipate she should be able to discharge in the next 24-48hrs.   Time spent coordinating care: 25 mins   LOS: 13 days   [name] [facility] Pager: [phone] 10/06/2012, 10:07 AM

## 2012-10-06 NOTE — Progress Notes (Signed)
Occupational Therapy Treatment Patient Details Name: [name] MRN: [id number] DOB: Aug 07, 1971 Today's Date: 10/06/2012 Time: 5621-3086 OT Time Calculation (min): 24 min  OT Assessment / Plan / Recommendation Comments on Treatment Session A:  Patient observed therapist doing transfer on transfer bench with education on safe technique getting in and out of the shower.  Patient was in a lot of pain today and sitting on the tub bench would increase her pain going down and getting up from low surface.  Patient observed and asked appropriate questions.  Would recommend she have someone with her for the first few transfers until she feels safe.    Follow Up Recommendations       Barriers to Discharge       Equipment Recommendations  Tub/shower bench;3 in 1 bedside comode    Recommendations for Other Services    Frequency     Plan Discharge plan remains appropriate    Precautions / Restrictions Precautions Precautions: Fall Precaution Comments: pt has a drain in abdominal incision        ADL  Upper Body Bathing: Simulated;Use of adaptive equipment;Supervision/safety;Min guard Where Assessed - Upper Body Bathing: Other (comment) (observed techniques she would use at home) Lower Body Bathing: Simulated;Moderate assistance;Other (comment) (will need assist until incision heals and pain decreases) Where Assessed - Lower Body Bathing: Other (comment) (observed technique she would use at home) Tub/Shower Transfer: Simulated;Minimal assistance;Other (comment) (observed techniques she would use with tub transfer bench.) Tub/Shower Transfer Method: Ambulating Tub/Shower Transfer Equipment: Transfer tub bench Transfers/Ambulation Related to ADLs: pt ambulated to bathroom and down the hall with SBA      OT Goals Acute Rehab OT Goals OT Goal Formulation: With patient Time For Goal Achievement: 10/19/12 Potential to Achieve Goals: Good ADL Goals Pt Will Perform Upper Body Bathing:  with modified independence;Sitting in shower ADL Goal: Upper Body Bathing - Progress: Progressing toward goals Pt Will Perform Lower Body Bathing: with supervision;Sit to stand in shower ADL Goal: Lower Body Bathing - Progress: Progressing toward goals Pt Will Perform Upper Body Dressing: with modified independence;Sitting, bed ADL Goal: Upper Body Dressing - Progress: Met Pt Will Perform Lower Body Dressing: with supervision;Sit to stand from bed ADL Goal: Lower Body Dressing - Progress: Met Pt Will Transfer to Toilet: 3-in-1;Ambulation;with supervision ADL Goal: Toilet Transfer - Progress: Met Pt Will Perform Toileting - Clothing Manipulation: with modified independence;Standing;Sitting on 3-in-1 or toilet ADL Goal: Toileting - Clothing Manipulation - Progress: Progressing toward goals Pt Will Perform Toileting - Hygiene: with modified independence;Sit to stand from 3-in-1/toilet;Leaning right and/or left on 3-in-1/toilet;Standing at 3-in-1/toilet ADL Goal: Toileting - Hygiene - Progress: Progressing toward goals Pt Will Perform Tub/Shower Transfer: Tub transfer;with supervision;Ambulation;Transfer tub bench ADL Goal: Tub/Shower Transfer - Progress: Progressing toward goals Arm Goals Pt Will Perform AROM: with supervision, verbal cues required/provided;Bilateral upper extremities;1 set;10 reps;Other (comment) (to increase strength; isometric; own body weight) Arm Goal: AROM - Progress: Progressing toward goal  Visit Information  Last OT Received On: 10/06/12 Reason Eval/Treat Not Completed: Pain limiting ability to participate    Subjective Data  Subjective: S:  I didn't sleep well last night and I am in a lot of pain.   Prior Functioning       Cognition  Overall Cognitive Status: Appears within functional limits for tasks assessed/performed Arousal/Alertness: Lethargic Orientation Level: Appears intact for tasks assessed Behavior During Session: Flat affect Cognition - Other  Comments: patient stated she did not sleep well last night and is in a  lot of pain today.    Mobility  Shoulder Instructions Bed Mobility Details for Bed Mobility Assistance: pt. sitting EOB when arrived and wanted to remain there after session. Transfers Transfers: Sit to Stand (with bed raised to decrease pain) Sit to Stand: 5: Supervision;Without upper extremity assist Stand to Sit: 5: Supervision;Without upper extremity assist;Other (comment) (with bed raised to decrease pain, then lowered to safe heigh)                 End of Session OT - End of Session Activity Tolerance: Patient limited by fatigue;Patient limited by pain Patient left: in bed;with call bell/phone within reach;Other (comment) (sitting EOB like she was when we arrived) Nurse Communication: Patient requests pain meds  GO     Maddison Kilner L. Brin Ruggerio, COTA/L  10/06/2012, 9:45 AM

## 2012-10-06 NOTE — Progress Notes (Signed)
Physical Therapy Treatment Patient Details Name: [name] MRN: [id number] DOB: October 04, 1971 Today's Date: 10/06/2012 Time: 3086-5784 PT Time Calculation (min): 33 min  PT Assessment / Plan / Recommendation Comments on Treatment Session  Pt is making significant strides with PT.  She was instructed in gait with a cane and she was able to ambulate about 400' with no fatigue.  She was instructed in stair climbing using rail and cane as assist and she was able to climb 11 steps with only mild difficulty.  There is no doubt that she could do 20 steps...we had to contend with her IV limitations.  The pt is feeling much more positive about her abilities and I feel that she will be able to manage at home with HHPT.    Follow Up Recommendations  Home health PT     Does the patient have the potential to tolerate intense rehabilitation     Barriers to Discharge        Equipment Recommendations  Tub/shower bench;3 in 1 bedside comode    Recommendations for Other Services    Frequency     Plan Discharge plan needs to be updated    Precautions / Restrictions Precautions Precautions: Fall Precaution Comments: pt has a drain in abdominal incision    Pertinent Vitals/Pain     Mobility  Bed Mobility Details for Bed Mobility Assistance: pt. sitting EOB when arrived and wanted to remain there after session. Transfers Sit to Stand: 5: Supervision;Without upper extremity assist Stand to Sit: 5: Supervision;Without upper extremity assist;Other (comment) (with bed raised to decrease pain, then lowered to safe heigh) Ambulation/Gait Ambulation/Gait Assistance: 5: Supervision Ambulation Distance (Feet): 400 Feet Assistive device: Straight cane Ambulation/Gait Assistance Details: Pt was instructed in gait with cane...she still feels more secure with the walker but was able to use the cane with adequate stability.  Gait was initially very tentative and lacked trunkal rotation, but eventually she  was able to relax and it became more fluuid.  The cane was especially beneficial for sassistance on stair climbing in tandem with the railing. Gait Pattern: Trunk flexed;Decreased trunk rotation General Gait Details: pt was instructed frequently to increase thoracic extension Stairs: Yes Stairs Assistance: 4: Min guard Stair Management Technique: One rail Right;Step to pattern;Forwards;With cane Number of Stairs: 11  Wheelchair Mobility Wheelchair Mobility: No    Exercises General Exercises - Lower Extremity Toe Raises: AROM;10 reps;Both;Standing Mini-Sqauts: AROM;Both;Standing   PT Diagnosis:    PT Problem List:   PT Treatment Interventions:     PT Goals Acute Rehab PT Goals PT Goal: Sit to Stand - Progress: Met PT Goal: Stand to Sit - Progress: Met PT Goal: Ambulate - Progress: Met PT Goal: Up/Down Stairs - Progress: Met  Visit Information  Last PT Received On: 10/06/12    Subjective Data  Subjective: I feel good   Cognition  Overall Cognitive Status: Appears within functional limits for tasks assessed/performed Arousal/Alertness: Lethargic Orientation Level: Appears intact for tasks assessed Behavior During Session: Flat affect Cognition - Other Comments: patient stated she did not sleep well last night and is in a lot of pain today.    Balance     End of Session PT - End of Session Equipment Utilized During Treatment: Gait belt Activity Tolerance: Patient tolerated treatment well Patient left: in bed;with call bell/phone within reach   GP     Myrlene Broker L 10/06/2012, 12:13 PM

## 2012-10-06 NOTE — Progress Notes (Signed)
ANTICOAGULATION CONSULT NOTE  Pharmacy Consult for Warfarin Indication:  DVT  No Known Allergies  Patient Measurements: Height: 5\' 9"  (175.3 cm) Weight: 201 lb 8 oz (91.4 kg) IBW/kg (Calculated) : 66.2   Vital Signs: Temp: 97.2 F (36.2 C) (10/09 0538) Temp src: Oral (10/09 0538) BP: 118/79 mmHg (10/09 0538) Pulse Rate: 80  (10/09 0538)  Labs:  Basename 10/06/12 0523 10/05/12 0432 10/04/12 0450  HGB 9.7* 8.5* --  HCT 29.8* 26.8* 29.4*  PLT 265 264 268  APTT -- -- --  LABPROT 21.2* 19.6* 18.6*  INR 1.92* 1.72* 1.61*  HEPARINUNFRC -- -- --  CREATININE -- -- 1.02  CKTOTAL -- -- --  CKMB -- -- --  TROPONINI -- -- --   Estimated Creatinine Clearance: 85.7 ml/min (by C-G formula based on Cr of 1.02).  Medical History: Past Medical History  Diagnosis Date  . Thyroid disease   . Fibroid tumor   . DVT (deep venous thrombosis) 09/30/12    in upper extremities b/l and LLE  . Anemia   . Thyroid nodule     s/p biopsy  . Graves disease   . Multinodular goiter 10/01/2012  . Lupus anticoagulant positive 10/02/2012   Medications:  Scheduled:     . acetaminophen  650 mg Oral Once  . diphenhydrAMINE  25 mg Oral Once  . docusate sodium  100 mg Oral BID  . enoxaparin (LOVENOX) injection  1 mg/kg Subcutaneous Q12H  . feeding supplement  237 mL Oral BID BM  . ferumoxytol  1,020 mg Intravenous Once  . furosemide  20 mg Intravenous Once  . pantoprazole  40 mg Oral Q1200  . polyethylene glycol  17 g Oral Daily  . potassium chloride  20 mEq Oral Daily  . propranolol  20 mg Oral BID  . sodium chloride      . warfarin  5 mg Oral ONCE-1800  . Warfarin - Pharmacist Dosing Inpatient   Does not apply q1800   Assessment: 41yo female with acute DVT.  Initially during admission pt was started on IV Heparin then pt developed bleeding around JP drain and MD ordered Protamine to reverse Heparin.  Pt went back to OR for hematoma removal.  Clinically improved now.  Coumadin and Lovenox  resumed until INR therapeutic per Dr Emelda Fear.  INR rising to goal. No bleeding noted.   Goal of Therapy:  Full dose Lovenox with Coumadin x 5 days minimum and until INR therapeutic x 2 days INR 2-3 on Coumadin Monitor platelets by anticoagulation protocol: Yes   Plan: Lovenox 95mg  sq q12hrs until INR therapeutic x24hrs Coumadin 5mg  today x 1 Daily INR and CBC for now  Elson Clan 10/06/2012,9:07 AM

## 2012-10-06 NOTE — Progress Notes (Signed)
Rehab Admissions Coordinator Note:  Patient was screened by Clois Dupes for appropriateness for an Inpatient Acute Rehab Consult. I was contacted by Rosemary Holms RN CM for a possible inpatient rehab admission. I noted that pt ambulated with OT today to bathroom and down the hall with stand by assist. Ambulated with PT yesterday without assistive device at close supervision. Difficulty is negotiating 20 steps to her apartment.  At this time, we are recommending Skilled Nursing Facility due to the stairs issues. Patient does not need intensive inpatient rehabilitation at this level. Please call me with any questions.   Clois Dupes, RN 10/06/2012, 11:04 AM  I can be reached at [phone].

## 2012-10-06 NOTE — Progress Notes (Signed)
Present with patient for emotional/spiritual support. She was very expressive about grief/loss issues around family dynamics.  She became tearful around issues of her hysterectomy and the loss of her ability to have children.  She shared about support from her church family and how this process of her hospitalization is helping her to come to terms with some of her loss issues.  Prayed with her and will follow up tomorrow.

## 2012-10-06 NOTE — Clinical Social Work Note (Signed)
Patient is to call Denzil Magnuson, [place] - case management for [employer].  They can assist w outpatient care management for patient's w complex medical needs.  [facility] is willing to do home assessment for volunteer caregiver support.  Volunteer can assist w light housekeeping 2 - 3 times/week, cannot do any personal care.  Volunteer could begin 10/11/12.   Patient also linked w [facility] to assist w transportation to appointments.  Patient given written information w these resources and contact information.  Santa Genera, LCSW Clinical Social Worker [phone])

## 2012-10-07 ENCOUNTER — Other Ambulatory Visit (HOSPITAL_COMMUNITY): Payer: Self-pay | Admitting: Oncology

## 2012-10-07 DIAGNOSIS — I82409 Acute embolism and thrombosis of unspecified deep veins of unspecified lower extremity: Secondary | ICD-10-CM

## 2012-10-07 LAB — CBC
Hemoglobin: 10.1 g/dL — ABNORMAL LOW (ref 12.0–15.0)
MCHC: 32.4 g/dL (ref 30.0–36.0)
WBC: 5.8 10*3/uL (ref 4.0–10.5)

## 2012-10-07 LAB — PROTIME-INR: INR: 2.04 — ABNORMAL HIGH (ref 0.00–1.49)

## 2012-10-07 MED ORDER — POLYETHYLENE GLYCOL 3350 17 G PO PACK: 17.0000 g | PACK | Freq: Every day | ORAL | Status: DC | PRN

## 2012-10-07 MED ORDER — WARFARIN SODIUM 5 MG PO TABS: 5.0000 mg | ORAL_TABLET | Freq: Every day | ORAL | Status: DC

## 2012-10-07 MED ORDER — OXYCODONE-ACETAMINOPHEN 5-325 MG PO TABS: 1.0000 | ORAL_TABLET | ORAL | Status: DC | PRN

## 2012-10-07 MED ORDER — ENOXAPARIN SODIUM 150 MG/ML ~~LOC~~ SOLN
140.0000 mg | Freq: Once | SUBCUTANEOUS | Status: AC
  Administered 2012-10-07: 140 mg via SUBCUTANEOUS
  Filled 2012-10-07: qty 1

## 2012-10-07 MED ORDER — FERROUS SULFATE 325 (65 FE) MG PO TABS: 325.0000 mg | ORAL_TABLET | Freq: Three times a day (TID) | ORAL | Status: DC

## 2012-10-07 NOTE — Discharge Summary (Signed)
Physician Discharge Summary  [name]:[id number] DOB: Sep 24, 1971 DOA: 09/23/2012  PCP: Milinda Antis, MD  Admit date: 09/23/2012 Discharge date: 10/07/2012  Recommendations for Outpatient Follow-up:  1. Follow up with primary doctor as scheduled on 10/25 2. Follow up with Dr. Emelda Fear in 1 week 3. Follow up with Dr. Mariel Sleet on 10/28 4. Follow up at coumadin clinic on Monday for INR 5. Follow up with Dr. Fransico Him in 2 weeks 6. 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 female who is well-known to our service. She has a massive fibroid uterus which has led to anemia from ongoing bleeding. She has been under the care of Dr. Emelda Fear, OB/GYN for this, and prior to her having the fibroid removed, he has been giving her Lupron injections. Also, unfortunately she has Graves' disease with goiter. Prior to any surgery, but Graves' disease needs to be treated. In the last couple of weeks she has not been feeling well and now she comes in a dehydrated state and also anemic once again.   Hospital Course:  This lady was admitted to the hospital with anemia and ongoing vaginal bleeding from uterine fibroid.  She was transfused prbc.   Unfortunately, she had infarcted her fibroid which subsequently became necrotic and the patient started to develop sepsis.  She was seen by Dr. Emelda Fear, was placed on appropriate antibiotic coverage with zosyn and taken for hysterectomy.  Patient was also found to have multiple DVTs in her bilateral upper extremities and left lower extremity.  She was started on anticoagulation.  Hypercoagulable panel was sent and was positive for lupus anticoagulant as well as protein S was low.  This was discussed with hematology and she will need follow up with for further work up.  Unfortunately, while on anticoagulation, patient developed some bleeding in her operative site. She developed a hematoma which was evacuated by Dr. Emelda Fear.  She has been restarted on coumadin and has not had any recurrence of bleeding.  She is now therapeutic on coumadin.  I have scheduled follow up at the cancer center for monitoring of her coumadin.  Patient has known graves disease and has been seeing Dr. Fransico Him.  She has been non compliant with her work up to date.  During this hospitalization, she was found to have a thyroid nodule.  This was discussed with Dr. Fransico Him and biopsy of nodule was recommended. 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  Dr. Emelda Fear discussed this with Dr. Fransico Him, and outpatient thyroidectomy was recommended.  Patient will be referred to Dr. Lovell Sheehan in the outpatient setting to have this done.  Patient has not had any further bleeding, she is tolerating anticoagulation and is felt stable to discharge home.  She will be set up with home health services.   Procedures:  Hysterectomy by Dr. Emelda Fear on 9/30  Evacuation of hematoma by Dr. Emelda Fear on 10/6  Ultrasound guided thyroid biopsy by Dr. Miles Costain on 10/1  Consultations:  Gynecology Dr. Emelda Fear  Endocrinology Dr. Fransico Him  Radiology, Dr. Miles Costain  Discharge Exam: Filed Vitals:   10/06/12 0538 10/06/12 1453  10/06/12 2125 10/07/12 0539  BP: 118/79 127/85 113/72 128/88  Pulse: 80 82 91 80  Temp: 97.2 F (36.2 C) 97.8 F (36.6 C) 97.3 F (36.3 C) 99.7 F (37.6 C)  TempSrc: Oral Oral Oral Oral  Resp: 18 18 17 16   Height:      Weight:      SpO2: 99% 100% 98% 94%    General: NAD Cardiovascular: S1, s2, rrr Respiratory: cta b  Discharge Instructions      Discharge Orders    Future Appointments: Provider: Department: Dept Phone: Center:   10/22/2012 2:00 PM Salley Scarlet, MD [facility] [phone] [facility]   10/25/2012 3:30 PM Randall An, MD [facility] [phone] None     Future Orders Please Complete By Expires   Diet general      Home Health      Questions: Responses:   To provide the following care/treatments RN    PT   Increase activity slowly      Call MD for:  temperature >100.4      Call MD for:  severe uncontrolled pain      Call MD for:  redness, tenderness, or signs of infection (pain, swelling, redness, odor or green/yellow discharge around incision site)      Call MD for:      Comments:   Recurrent bleeding       Medication List     As of 10/07/2012  1:04 PM    STOP taking these medications         ALPRAZolam 1 MG tablet   Commonly known as: XANAX      TAKE these medications         ferrous sulfate 325 (65 FE) MG tablet   Take 1 tablet (325 mg total) by mouth 3 (three) times daily with meals.      oxyCODONE-acetaminophen 5-325 MG per tablet   Commonly known as: PERCOCET/ROXICET   Take 1-2 tablets by mouth every 4 (four) hours as needed (moderate to severe pain (when tolerating fluids)).      polyethylene glycol packet   Commonly known as: MIRALAX / GLYCOLAX   Take 17 g by mouth daily as needed (constipation).      propranolol 20 MG tablet   Commonly known as: INDERAL   Take 20 mg by mouth 2 (two) times daily.      warfarin 5 MG tablet   Commonly known as: COUMADIN   Take 1 tablet (5 mg total) by mouth daily at 6 PM.              The results of significant diagnostics from this hospitalization (including imaging, microbiology, ancillary and laboratory) are listed below for reference.    Significant Diagnostic Studies: Ct Abdomen Pelvis Wo Contrast  10/02/2012  *RADIOLOGY REPORT*  Clinical Data: Recent surgery.  Questionable hematoma versus infection.  The patient reportedly is on anticoagulants and had leading into the incision.  CT ABDOMEN AND PELVIS WITHOUT CONTRAST  Technique:  Multidetector CT imaging of the abdomen and pelvis was performed following the standard protocol without intravenous contrast.  Comparison: CT, 09/25/2012  Findings: There is minimal dependent lung base subsegmental atelectasis.  A minimal right pleural effusion is present.  The lung bases otherwise clear and the heart is normal in size.  A complex hematoma lies along the anterior lower abdominal wall incision line contiguous with a surgical drain.  It measures approximately 8.6 cm by 6.6 cm x 5.8 cm in size.  There is a small amount of ascites that lies in the pelvis, between leaves of the mesentery in the lower abdomen and tracks along the pericolic gutters to lie adjacent to the liver and spleen.  More heterogeneous material is also seen in the pelvis above the bladder and vaginal cuff that is likely products hemorrhage.  This is relatively small in amount.  No defined intra-abdominal or pelvic hematoma is seen other than that along the surgical incision. There is a small amount of free intraperitoneal air.  This is presumed to be postsurgical.  The liver and spleen are unremarkable.  There is some dependent density in the gallbladder that was not present previously.  This may reflect some viscus bile or contrast excreted into the biliary system.  The gallbladder is otherwise unremarkable.  No bile duct dilation.  Normal pancreas. No adrenal masses.  The kidneys, ureters and bladder are unremarkable.  The markedly enlarged uterus and its  degenerating fibroid have been removed since the prior study.  There are a few mildly enlarged retroperitoneal lymph nodes, which are stable from prior study.  No other adenopathy.  There is increase stool the colon.  The bowel is otherwise unremarkable.  A normal appendix is visualized.  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  This is stable.  IMPRESSION:   Original Report Authenticated By: Domenic Moras, M.D.    Ct Chest W Contrast  09/30/2012  *RADIOLOGY REPORT*  Clinical Data: Multiple extremity DVTs, thyroid nodule, evaluate for malignancy  CT CHEST WITH CONTRAST  Technique:  Multidetector CT imaging of the chest was performed following the standard protocol during bolus administration of intravenous contrast.  Contrast: 80mL OMNIPAQUE IOHEXOL 300 MG/ML  SOLN  Comparison: CT abdomen/pelvis 09/25/2012; chest x-ray 09/26/2012  Findings:  Mediastinum: Heterogeneous multinodular thyroid gland.  Right upper extremity PICC the catheter tip terminates in the upper right atrium.  No suspicious mediastinal or hilar adenopathy. Visualized esophagus is within normal limits.  Heart/Vascular: The heart is mildly enlarged.  No pericardial effusion.  Variant aortic arch branching pattern.  The left carotid right brachiocephalic arteries share a common origin.  The left vertebral artery arises directly from the thoracic aorta.  No dissection, or aneurysmal dilatation.  Lungs/Pleura: Small 3 mm triangular subpleural nodule associated with a minor fissure, likely a subpleural lymph node.  Negative for edema, or focal airspace consolidation.  Trace right pleural effusion.  Mild dependent atelectasis in the lower lobes bilaterally.  Upper Abdomen: Locules of free intraperitoneal air are noted in the perihepatic space, and within the porta hepatis.  Otherwise, the upper abdomen is unremarkable  Bones: No acute fracture or aggressive  appearing lytic or blastic osseous lesion.  Vertebral segmentation defect.  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  There is associated focal kyphosis at this level and partial bony fusion of the posterior elements of T10, and T11.  IMPRESSION:  1.  Negative for intrathoracic malignancy. 2.  Enlarged, multinodular thyroid gland is nonspecific by CT. 3.  Small right pleural effusion and trace bibasilar dependent atelectasis.  4.  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  Locules of free intraperitoneal air noted in the porta hepatis and perihepatic space.  Presumably this is related to the patient's recent hysterectomy. 6.  Borderline cardiomegaly   Original Report Authenticated By: Sterling Big, M.D.    US Soft Tissue Head/neck  09/28/2012  *RADIOLOGY REPORT*  Clinical Data: Thyroid nodule  THYROID ULTRASOUND  Technique: Ultrasound examination of the thyroid gland and adjacent soft tissues was performed.  Comparison:  None.  Findings:  Right thyroid lobe:  23 x 33 x 15 mm, inhomogeneous echotexture Left thyroid lobe:  29 x 31 x 55 mm Isthmus:  1.2 cm in thickness  Focal nodules:  18 x 20 x 31 mm solid, mid-right 25 x 29 x 50 mm solid, mid-left There are additional much smaller nodules bilaterally.  Lymphadenopathy:  None visualized.  IMPRESSION:  Bilateral thyroid nodules.  The dominant left and right lesions meet consensus criteria for biopsy.  Ultrasound-guided fine needle aspiration should be considered, as per the consensus statement: Management of Thyroid Nodules Detected at [place]:  Society of Radiologists in Ultrasound Consensus Conference Statement. Radiology 2005; [id number].   Original Report Authenticated By: Osa Craver, M.D.    US Guided Needle Placement  09/28/2012  *RADIOLOGY REPORT*  Clinical Data:  Dominant bilateral thyroid nodules multinodular thyroid gland  ULTRASOUND GUIDED NEEDLE  ASPIRATE BIOPSY OF THE THYROID GLAND  Comparison: 09/28/2012  Thyroid biopsy was thoroughly discussed with the patient and questions were answered.  The benefits, risks, alternatives, and complications were also discussed.  The patient understands and wishes to proceed with the procedure.  Written consent was obtained.  Ultrasound was performed to localize and mark an adequate site for the biopsy.  The patient was then prepped and draped in a normal sterile fashion.  Local anesthesia was provided with 1% lidocaine. Using direct ultrasound guidance, 3 passes were made using 25 gauge needles into the nodule within the right lobe of the thyroid. Ultrasound was used to confirm needle placements on all occasions. Specimens were sent to Pathology for analysis.  Complications:  No immediate  Findings:  Imaging confirms needle placed in the dominant right thyroid mass  IMPRESSION: Ultrasound guided needle aspirate biopsy performed of the dominant right thyroid nodule.  ULTRASOUND GUIDED NEEDLE ASPIRATE BIOPSY OF THE THYROID GLAND  Comparison: 09/28/2012  Thyroid biopsy was thoroughly discussed with the patient and questions were answered.  The benefits, risks, alternatives, and complications were also discussed.  The patient understands and wishes to proceed with the procedure.  Written consent was obtained.  Ultrasound was performed to localize and mark an adequate site for the biopsy.  The patient was then prepped and draped in a normal sterile fashion.  Local anesthesia was provided with 1% lidocaine. Using direct ultrasound guidance, 3 passes were made using 25 gauge needles into the nodule within the left lobe of the thyroid. Ultrasound was used to confirm needle placements on all occasions. Specimens were sent to Pathology for analysis.  Complications:  No immediate  Findings:  Imaging confirms needle placed in the dominant left thyroid mass  IMPRESSION: Ultrasound guided needle aspirate biopsy performed of the  dominant left thyroid nodule.   Original Report Authenticated By: Judie Petit. Ruel Favors, M.D.    Ct Abdomen Pelvis W Contrast  09/25/2012  *RADIOLOGY REPORT*  Clinical Data: Fibroid uterus with profuse vaginal drainage. Suspect infarcted fibroid with  central necrosis.  CT ABDOMEN AND PELVIS WITH CONTRAST  Technique:  Multidetector CT imaging of the abdomen and pelvis was performed following the standard protocol during bolus administration of intravenous contrast.  Contrast: OMNIPAQUE IOHEXOL 300 MG/ML  SOLN  Comparison: Abdominal ultrasound of 08/29/2012.  Abdominal pelvic CT of 06/11/2012.  Findings: Bibasilar dependent atelectasis. Normal heart size without pericardial or pleural effusion.  Too small to characterize hepatic dome lesion.  Normal spleen, stomach, pancreas, gallbladder, biliary tract, adrenal glands.  Too small to characterize upper pole right renal lesion which is likely a cyst.  Normal left kidney. No hydronephrosis.  Prominent retroperitoneal nodes are again identified.  Largest measures 1.1 cm on image 39.  This is similar to on the prior.  Abdominal pelvic bowel loops are displaced by the mass to be detailed below.  No bowel obstruction is identified.  There is small volume pelvic and trace abdominal ascites which is new.   1.4 cm soft tissue density on image 78 within the left hemi pelvis is favored to represent ovarian tissue, when compared the prior exam. A second site of adenopathy is felt less likely.  There are prominent nodes along the left common iliac vasculature.  The presumed uterine based mass is again identified extending into the upper abdomen.  This measures 23.7 x 15.9 cm on image 53 at approximately the same level as on the prior exam.  On that exam, it measured 25.4 x 17.7 cm.  There are new areas of central gas, most consistent with necrosis.  Extensive heterogeneous density throughout.  No well-defined fluid collection.  Foley catheter within urinary bladder.  Small  volume cul-de-sac fluid.  Mild anasarca which is new.  IMPRESSION:  1. Development of multifocal central gas within the previously described large abdominal pelvic mass.  Most consistent with necrosis of a dominant fibroid.  Superinfection cannot be excluded. Differential considerations for the presumed uterine based mass include leiomyosarcoma. 2.  Development of small volume pelvic fluid and trace abdominal ascites. 3.  Mild abdominal and possibly pelvic adenopathy.  Favored to be reactive.  Consider CT follow-up.   Original Report Authenticated By: Consuello Bossier, M.D.    US Venous Img Lower Bilateral  09/28/2012  *RADIOLOGY REPORT*  Clinical Data: Pain, swelling, history DVT.  BILATERAL LOWER EXTREMITY VENOUS DOPPLER ULTRASOUND  Technique: Gray-scale sonography with compression, as well as color and duplex ultrasound, were performed to evaluate the deep venous system from the level of the common femoral vein through the popliteal and proximal calf veins.  Comparison: 08/29/2012  Findings: On the right, normal compressibility of  the common femoral, superficial femoral, and popliteal veins, as well as the proximal calf veins.  No filling defects to suggest DVT on grayscale or color Doppler imaging.  Doppler waveforms show normal direction of venous flow, normal respiratory phasicity and response to augmentation.  On the left, there is partially occlusive noncompressible thrombus in the popliteal vein.  The femoral vein, profunda femoris, and common femoral vein as well as the saphenofemoral junction remain widely patent without central extension of thrombus.  IMPRESSION: 1.  Left popliteal DVT. 2.  No evidence of right lower extremity DVT.   Original Report Authenticated By: Osa Craver, M.D.    US Venous Img Upper Uni Left  09/27/2012  *RADIOLOGY REPORT*  Clinical Data: Left arm pain and swelling.  History of a central line on the right.  UPPER LEFT VENOUS EXTREMITY ULTRASOUND  Comparison: None.   Findings: The left  brachial vein is filled with hypoechoic material with no internal blood flow seen with color Doppler or pulsed Doppler.  The vein did not compress normally.  The remainder of the examined deep veins of the left arm have normal appearances, including the left subclavian and jugular veins.  No additional thrombi were seen.  There is a large heterogeneous mass in the left lobe of the thyroid gland, with solid and cystic components.  This is predominately solid and measures 4.9 x 3.5 x 2.9 cm in maximum dimensions.  Also demonstrated are mildly prominent lymph nodes in the lateral neck on the left.  The largest has a short axis diameter of 5 mm.  IMPRESSION:  1.  Deep venous thrombus completely occluding the left brachial vein. 2.  4.9 cm left lobe thyroid nodule.  This is suspicious for malignancy and will require biopsy correlation.  An elective dedicated thyroid ultrasound is recommended. 3.  Mildly prominent left neck lymph nodes laterally.   Original Report Authenticated By: Darrol Angel, M.D.    US Venous Img Upper Uni Right  09/28/2012  *RADIOLOGY REPORT*  Clinical Data: PICC line in place.  Arm swelling.  Pain.  Edema.  RIGHT UPPER EXTREMITY VENOUS DUPLEX ULTRASOUND  Technique:  Gray-scale sonography with graded compression, as well as color Doppler and duplex ultrasound were performed to evaluate the upper extremity deep venous system from the level of the subclavian vein and including the jugular, axillary, basilic and upper cephalic vein.  Spectral Doppler was utilized to evaluate flow at rest and with distal augmentation maneuvers.  Comparison:  None.  Findings: Normal color signal and wave forms in the visualized portions of right internal jugular vein.  There is diffuse partially occlusive thrombus along the left arm PICC line extending through visualized portions of right brachial and axillary veins.  There is also partially occlusive thrombus noted in the right basilic and radial  veins and partial thrombosis in the right cephalic vein below the elbow. Ulnar vein is unremarkable in its visualized segments.  Subcutaneous edema is evident in the forearm.  IMPRESSION:  1.  Right upper extremity DVT as above.   Original Report Authenticated By: Osa Craver, M.D.    US Thyroid Biopsy  09/28/2012  *RADIOLOGY REPORT*  Clinical Data:  Dominant bilateral thyroid nodules multinodular thyroid gland  ULTRASOUND GUIDED NEEDLE ASPIRATE BIOPSY OF THE THYROID GLAND  Comparison: 09/28/2012  Thyroid biopsy was thoroughly discussed with the patient and questions were answered.  The benefits, risks, alternatives, and complications were also discussed.  The patient understands and wishes to proceed with the procedure.  Written consent was obtained.  Ultrasound was performed to localize and mark an adequate site for the biopsy.  The patient was then prepped and draped in a normal sterile fashion.  Local anesthesia was provided with 1% lidocaine. Using direct ultrasound guidance, 3 passes were made using 25 gauge needles into the nodule within the right lobe of the thyroid. Ultrasound was used to confirm needle placements on all occasions. Specimens were sent to Pathology for analysis.  Complications:  No immediate  Findings:  Imaging confirms needle placed in the dominant right thyroid mass  IMPRESSION: Ultrasound guided needle aspirate biopsy performed of the dominant right thyroid nodule.  ULTRASOUND GUIDED NEEDLE ASPIRATE BIOPSY OF THE THYROID GLAND  Comparison: 09/28/2012  Thyroid biopsy was thoroughly discussed with the patient and questions were answered.  The benefits, risks, alternatives, and complications were also discussed.  The patient understands and wishes  to proceed with the procedure.  Written consent was obtained.  Ultrasound was performed to localize and mark an adequate site for the biopsy.  The patient was then prepped and draped in a normal sterile fashion.  Local anesthesia was  provided with 1% lidocaine. Using direct ultrasound guidance, 3 passes were made using 25 gauge needles into the nodule within the left lobe of the thyroid. Ultrasound was used to confirm needle placements on all occasions. Specimens were sent to Pathology for analysis.  Complications:  No immediate  Findings:  Imaging confirms needle placed in the dominant left thyroid mass  IMPRESSION: Ultrasound guided needle aspirate biopsy performed of the dominant left thyroid nodule.   Original Report Authenticated By: Judie Petit. Ruel Favors, M.D.    US Thyroid Biopsy  09/28/2012  *RADIOLOGY REPORT*  Clinical Data:  Dominant bilateral thyroid nodules multinodular thyroid gland  ULTRASOUND GUIDED NEEDLE ASPIRATE BIOPSY OF THE THYROID GLAND  Comparison: 09/28/2012  Thyroid biopsy was thoroughly discussed with the patient and questions were answered.  The benefits, risks, alternatives, and complications were also discussed.  The patient understands and wishes to proceed with the procedure.  Written consent was obtained.  Ultrasound was performed to localize and mark an adequate site for the biopsy.  The patient was then prepped and draped in a normal sterile fashion.  Local anesthesia was provided with 1% lidocaine. Using direct ultrasound guidance, 3 passes were made using 25 gauge needles into the nodule within the right lobe of the thyroid. Ultrasound was used to confirm needle placements on all occasions. Specimens were sent to Pathology for analysis.  Complications:  No immediate  Findings:  Imaging confirms needle placed in the dominant right thyroid mass  IMPRESSION: Ultrasound guided needle aspirate biopsy performed of the dominant right thyroid nodule.  ULTRASOUND GUIDED NEEDLE ASPIRATE BIOPSY OF THE THYROID GLAND  Comparison: 09/28/2012  Thyroid biopsy was thoroughly discussed with the patient and questions were answered.  The benefits, risks, alternatives, and complications were also discussed.  The patient understands  and wishes to proceed with the procedure.  Written consent was obtained.  Ultrasound was performed to localize and mark an adequate site for the biopsy.  The patient was then prepped and draped in a normal sterile fashion.  Local anesthesia was provided with 1% lidocaine. Using direct ultrasound guidance, 3 passes were made using 25 gauge needles into the nodule within the left lobe of the thyroid. Ultrasound was used to confirm needle placements on all occasions. Specimens were sent to Pathology for analysis.  Complications:  No immediate  Findings:  Imaging confirms needle placed in the dominant left thyroid mass  IMPRESSION: Ultrasound guided needle aspirate biopsy performed of the dominant left thyroid nodule.   Original Report Authenticated By: Judie Petit. Ruel Favors, M.D.    Dg Chest Port 1 View  09/26/2012  *RADIOLOGY REPORT*  Clinical Data: PICC placement.  PORTABLE CHEST - 1 VIEW  Comparison: Chest 09/25/2012.  Findings: New right PICC is in place with tip projecting in the superior cavoatrial junction.  The lung volumes are low.  Left basilar airspace disease is again seen.  No pneumothorax. Cardiomegaly.  IMPRESSION:  1.  Tip of right PICC projects over the superior cavoatrial junction. 2.  No change in left basilar airspace disease, likely atelectasis.   Original Report Authenticated By: Bernadene Bell. Maricela Curet, M.D.    Dg Chest Port 1 View  09/25/2012  *RADIOLOGY REPORT*  Clinical Data: Fever.  PORTABLE CHEST - 1 VIEW  Comparison: Chest  x-ray 08/28/2012.  Findings: Lung volumes are slightly low. Linear opacity at the left base is most compatible with subsegmental atelectasis.  No consolidative airspace disease.  No pleural effusions.  No pneumothorax.  No pulmonary nodule or mass noted.  Pulmonary vasculature and the cardiomediastinal silhouette are within normal limits.  IMPRESSION: 1. Low lung volumes with subsegmental atelectasis in the left lower lobe.  No radiographic evidence of acute cardiopulmonary  disease.   Original Report Authenticated By: Florencia Reasons, M.D.     Microbiology: No results found for this or any previous visit (from the past 240 hour(s)).   Labs: Basic Metabolic Panel:  Lab 10/04/12 7846 10/03/12 0523 10/02/12 0506 10/01/12 0404  NA 136 135 138 137  K 4.1 3.7 3.6 3.4*  CL 100 100 105 106  CO2 30 30 28 26   GLUCOSE 89 89 88 88  BUN 9 8 7 8   CREATININE 1.02 0.87 0.95 0.99  CALCIUM 8.9 9.2 8.9 8.9  MG -- -- -- --  PHOS -- -- -- --   Liver Function Tests: No results found for this basename: AST:5,ALT:5,ALKPHOS:5,BILITOT:5,PROT:5,ALBUMIN:5 in the last 168 hours No results found for this basename: LIPASE:5,AMYLASE:5 in the last 168 hours No results found for this basename: AMMONIA:5 in the last 168 hours CBC:  Lab 10/07/12 0525 10/06/12 0523 10/05/12 0432 10/04/12 0450 10/03/12 0523  WBC 5.8 6.8 10.0 11.6* 8.0  NEUTROABS -- -- -- -- --  HGB 10.1* 9.7* 8.5* 9.2* 8.9*  HCT 31.2* 29.8* 26.8* 29.4* 28.4*  MCV 83.2 83.2 82.2 82.1 81.8  PLT 288 265 264 268 256   Cardiac Enzymes: No results found for this basename: CKTOTAL:5,CKMB:5,CKMBINDEX:5,TROPONINI:5 in the last 168 hours BNP: BNP (last 3 results)  Basename 08/28/12 2145  PROBNP 617.9*   CBG: No results found for this basename: GLUCAP:5 in the last 168 hours  Time coordinating discharge: greater than 30 minutes  Signed:  Makeila Yamaguchi  [facility] 10/07/2012, 1:04 PM

## 2012-10-07 NOTE — Progress Notes (Signed)
ANTICOAGULATION CONSULT NOTE  Pharmacy Consult for Warfarin Indication:  DVT  No Known Allergies  Patient Measurements: Height: 5\' 9"  (175.3 cm) Weight: 201 lb 8 oz (91.4 kg) IBW/kg (Calculated) : 66.2   Vital Signs: Temp: 99.7 F (37.6 C) (10/10 0539) Temp src: Oral (10/10 0539) BP: 128/88 mmHg (10/10 0539) Pulse Rate: 80  (10/10 0539)  Labs:  Basename 10/07/12 0525 10/06/12 0523 10/05/12 0432  HGB 10.1* 9.7* --  HCT 31.2* 29.8* 26.8*  PLT 288 265 264  APTT -- -- --  LABPROT 22.2* 21.2* 19.6*  INR 2.04* 1.92* 1.72*  HEPARINUNFRC -- -- --  CREATININE -- -- --  CKTOTAL -- -- --  CKMB -- -- --  TROPONINI -- -- --   Estimated Creatinine Clearance: 85.7 ml/min (by C-G formula based on Cr of 1.02).  Medical History: Past Medical History  Diagnosis Date  . Thyroid disease   . Fibroid tumor   . DVT (deep venous thrombosis) 09/30/12    in upper extremities b/l and LLE  . Anemia   . Thyroid nodule     s/p biopsy  . Graves disease   . Multinodular goiter 10/01/2012  . Lupus anticoagulant positive 10/02/2012   Medications:  Scheduled:     . docusate sodium  100 mg Oral BID  . enoxaparin (LOVENOX) injection  1 mg/kg Subcutaneous Q12H  . feeding supplement  237 mL Oral BID BM  . ferumoxytol  1,020 mg Intravenous Once  . pantoprazole  40 mg Oral Q1200  . polyethylene glycol  17 g Oral Daily  . potassium chloride  20 mEq Oral Daily  . propranolol  20 mg Oral BID  . warfarin  5 mg Oral ONCE-1800  . Warfarin - Pharmacist Dosing Inpatient   Does not apply q1800   Assessment: 41yo female with acute DVT.  Initially during admission pt was started on IV Heparin then pt developed bleeding around JP drain and MD ordered Protamine to reverse Heparin.  Pt went back to OR for hematoma removal.  Clinically improved now.  Coumadin and Lovenox resumed until INR therapeutic per Dr Emelda Fear.  INR at goal today. No bleeding noted.   Goal of Therapy:  Full dose Lovenox with  Coumadin x 5 days minimum and until INR therapeutic x 24 hours INR 2-3 on Coumadin Monitor platelets by anticoagulation protocol: Yes   Plan: Coumadin 5mg  po daily Change Lovenox to 140mg  sq x1 in anticipation of discharge today to provide 24hr overlap INR monitoring as outpatient  Elson Clan 10/07/2012,8:03 AM

## 2012-10-07 NOTE — Progress Notes (Signed)
INR this AM - 2.04.  Called MD - order to hold this AM. Lovenox not given this AM

## 2012-10-07 NOTE — Progress Notes (Signed)
Patient discharged home with family.  All follow up appointments in place as well as HH.  Instructed to call and make appointment with Dr. Fransico Him in 2 weeks - office closed at this time d/t lunchtime.  Instructed on new medications and to stop taking xanax at home.  PICC removed - WNL.  Instructed on incisional care. Patient has no questions at this time.  Verbalizes understanding.  Stable to discharge.

## 2012-10-07 NOTE — Clinical Social Work Note (Signed)
CSW spoke w patient, patient plans to return to her home w spouse.  Patient given contact information for [facility] (transportation), [facility] (volunteer in home caregiver support 2 - 3 days/week during recovery), grief/loss recovery resources.  Patient visiting w friend who voiced that she and others will check on patient as she recovers.  Patient anxious to discharge to home, has home health resources in place.  Attempted to contact [facility] CM to give update on home health plan of care, left voice mail for care manager.  At this point, patient does not need SNF placement at discharge since she has made significant progress w PT.  Patient seems satisfied w current resources for assistance on outpatient basis.  CSW signing off unless further needs arise.   Santa Genera, LCSW Clinical Social Worker [phone])

## 2012-10-07 NOTE — Anesthesia Postprocedure Evaluation (Signed)
  Anesthesia Post-op Note  Patient: [name]  Procedure(s) Performed: Procedure(s) (LRB) with comments: WOUND EXPLORATION (N/A) EVACUATION HEMATOMA (N/A)  Patient Location: Room 203  Anesthesia Type: General  Level of Consciousness: awake, alert , oriented and patient cooperative  Airway and Oxygen Therapy: Patient Spontanous Breathing on room air  Post-op Pain: mild  Post-op Assessment: Post-op Vital signs reviewed, Patient's Cardiovascular Status Stable, Respiratory Function Stable, Patent Airway and No signs of Nausea or vomiting  Post-op Vital Signs: Reviewed and stable  Complications: No apparent anesthesia complications

## 2012-10-07 NOTE — Clinical Social Work Note (Signed)
[facility] care manager returned call, says she will contact patient directly in room in order to begin process of outpatient care coordination in order to assist w managing complex medical needs outpatient.    Santa Genera, LCSW Clinical Social Worker [phone])

## 2012-10-07 NOTE — Addendum Note (Signed)
Addendum  created 10/07/12 1005 by Marolyn Hammock, CRNA   Modules edited:Notes Section

## 2012-10-07 NOTE — Progress Notes (Signed)
Spoke with pharmacist - patient will need to have one more dose of lovenox to comply with core measures. Must have 24 hours of overlap with coumadin.  Will give updated lovenox dose this morning.  Disregard previus progress note

## 2012-10-07 NOTE — Progress Notes (Signed)
4 Days Post-Op Procedure(s) (LRB): WOUND EXPLORATION (N/A) EVACUATION HEMATOMA (N/A), also with 3-extremity DVT's , and deconditioned.  Home support still an issue.  Subjective: Patient reports + flatus and no problems voiding. She is Still not sure whose house she will be going to stay in when she leaves.  She is strongly encouraged to begin calls NOW to determine outpatient care options. She alleges she will call.    Objective: BP 128/88  Pulse 80  Temp 99.7 F (37.6 C) (Oral)  Resp 16  Ht 5\' 9"  (1.753 m)  Wt 91.4 kg (201 lb 8 oz)  BMI 29.76 kg/m2  SpO2 94%  LMP 09/18/2012  Weight was 97.4 kg on admit  I have reviewed patient's vital signs, intake and output and labs. Afebrile, tolerating diet.  INR is therapeutic at 2.04, so today's Lovenox increased to 1.5 mg/kg to allow 24 hr overlap, then pt able to be discharged on current coumadin dose with lovenox discontinued.   JP drain removed.  Physical Examination: General appearance - alert, well appearing, and in no distress, well hydrated, chronically ill appearing and significantly better than earlier. Thyroid bx site normal. Chest - clear to auscultation, no wheezes, rales or rhonchi, symmetric air entry Abdomen - soft, nontender, nondistended, no masses or organomegaly Incision clean, JP drain out. Leg edema still 3+ but subjectively significantly softer, upper extremity improved, now minimal edema.  No erythema at PICC site. Assessment: s/p Procedure(s) (LRB) with comments: WOUND EXPLORATION (N/A) EVACUATION HEMATOMA (N/A): stable  Plan: Discharge home today once pt confirms home care location.  Will be able to d/c picc prior to discharge.  LOS: 14 days    Espn Zeman V 10/07/2012, 8:40 AM

## 2012-10-08 ENCOUNTER — Encounter (HOSPITAL_COMMUNITY): Payer: Self-pay | Admitting: Obstetrics and Gynecology

## 2012-10-12 ENCOUNTER — Emergency Department (HOSPITAL_COMMUNITY)
Admission: EM | Admit: 2012-10-12 | Discharge: 2012-10-12 | Disposition: A | Discharge status: Home / Self Care | Payer: 59 | Attending: Emergency Medicine | Admitting: Emergency Medicine

## 2012-10-12 ENCOUNTER — Encounter (HOSPITAL_COMMUNITY): Payer: Self-pay

## 2012-10-12 DIAGNOSIS — Z4800 Encounter for change or removal of nonsurgical wound dressing: Secondary | ICD-10-CM | POA: Insufficient documentation

## 2012-10-12 NOTE — ED Notes (Signed)
pt had hysterectomy about 2 weeks ago and is here for a dressing change. Denies any other complaints. Stated she was told to come to er for dressing changes. Has appointment w. Dr. Emelda Fear in the am

## 2012-10-12 NOTE — ED Notes (Signed)
Pts Hyst was 9/30 and had wound exploration after that.

## 2012-10-12 NOTE — ED Provider Notes (Signed)
History     CSN: [id number]  Arrival date & time 10/12/12  1005   First MD Initiated Contact with Patient 10/12/12 1124      Chief Complaint  Patient presents with  . Dressing Change    (Consider location/radiation/quality/duration/timing/severity/associated sxs/prior treatment) HPI Comments: Patient complains of drainage from a surgical incision that began on the evening prior to ED arrival. She is status post supra-cervical abdominal hysterectomy performed 2 weeks ago.  She states that she noticed a" watery" drainage from the incision and she was concerned that she may have developed an infection. She denies fever, vomiting, chills, or significant abdominal pain. She states she is having normal bowel movements and urinating without difficulty. Patient states she does have an appointment with Dr. Emelda Fear tomorrow morning.  The history is provided by the patient.    Past Medical History  Diagnosis Date  . Thyroid disease   . Fibroid tumor   . DVT (deep venous thrombosis) 09/30/12    in upper extremities b/l and LLE  . Anemia   . Thyroid nodule     s/p biopsy  . Graves disease   . Multinodular goiter 10/01/2012  . Lupus anticoagulant positive 10/02/2012    Past Surgical History  Procedure Date  . Supraclavical abdominal hysterectomy 09/27/2012    Procedure: HYSTERECTOMY SUPRACERVICAL ABDOMINAL;  Surgeon: Tilda Burrow, MD;  Location: [facility];  Service: Gynecology;  Laterality: N/A;  . Hematoma evacuation 10/03/2012    Procedure: EVACUATION HEMATOMA;  Surgeon: Tilda Burrow, MD;  Location: [facility];  Service: Gynecology;  Laterality: N/A;    No family history on file.  History  Substance Use Topics  . Smoking status: Never Smoker   . Smokeless tobacco: Never Used  . Alcohol Use: No    OB History    Grav Para Term Preterm Abortions TAB SAB Ect Mult Living                  Review of Systems  Constitutional: Negative for fever, activity change and appetite change.    Respiratory: Negative for chest tightness and shortness of breath.   Cardiovascular: Negative for chest pain.  Gastrointestinal: Negative for nausea, vomiting, abdominal pain, diarrhea, constipation and abdominal distention.  Genitourinary: Negative for dysuria, vaginal bleeding, difficulty urinating and vaginal pain.  Musculoskeletal: Negative for gait problem.  Skin: Positive for wound.  Neurological: Negative for dizziness and headaches.  All other systems reviewed and are negative.    Allergies  Review of patient's allergies indicates no known allergies.  Home Medications   Current Outpatient Rx  Name Route Sig Dispense Refill  . FERROUS SULFATE 325 (65 FE) MG PO TABS Oral Take 1 tablet (325 mg total) by mouth 3 (three) times daily with meals. 90 tablet 3  . OXYCODONE-ACETAMINOPHEN 5-325 MG PO TABS Oral Take 1-2 tablets by mouth every 4 (four) hours as needed (moderate to severe pain (when tolerating fluids)). 30 tablet 0  . POLYETHYLENE GLYCOL 3350 PO PACK Oral Take 17 g by mouth daily as needed (constipation). 14 each 1  . PROPRANOLOL HCL 20 MG PO TABS Oral Take 20 mg by mouth 2 (two) times daily.    . WARFARIN SODIUM 5 MG PO TABS Oral Take 1 tablet (5 mg total) by mouth daily at 6 PM. 30 tablet 1    BP 128/89  Pulse 92  Temp 98.2 F (36.8 C) (Oral)  Resp 20  Ht 5\' 9"  (1.753 m)  Wt 210 lb (95.255 kg)  BMI 31.01 kg/m2  SpO2 100%  LMP 09/18/2012  Physical Exam  Nursing note and vitals reviewed. Constitutional: She is oriented to person, place, and time. She appears well-developed and well-nourished. No distress.  HENT:  Head: Normocephalic and atraumatic.  Cardiovascular: Normal rate, regular rhythm and normal heart sounds.   No murmur heard. Pulmonary/Chest: Effort normal and breath sounds normal. No respiratory distress.  Abdominal: Bowel sounds are normal. She exhibits no distension and no mass. There is no tenderness. There is no rebound and no guarding.          Midline surgical incision of the abdominal wall. Most of the incision appears to be healing well and has steri- strips in place, there is a small portion at the inferior end of the incision that is draining a serosanguineous fluid,  there is no surrounding erythema or excessive warmth. Abdomen remains soft and nontender, bowel sounds are present. No guarding or rebound tenderness  Musculoskeletal: She exhibits no edema.  Neurological: She is alert and oriented to person, place, and time. Coordination normal.  Skin: Skin is warm and dry.    ED Course  Procedures (including critical care time)  Labs Reviewed - No data to display No results found.      MDM    Abdominal wall incision appears to be healing well except a small portion of the lower incision draining a small amt of serosanguinous fluid.  Likely a seroma.  No purulent drainage, surrounding erythema.  abd is soft, NT.  No peritoneal signs.  Bowel sounds are nml   Patient has appt with Dr. Emelda Fear tomorrow.    Pt seen by EDP also.   Wound was cleaned and bandaged. Patient agrees to followup with her gynecologist tomorrow      Renny Gunnarson L. [place], [place] 10/14/12 1712

## 2012-10-12 NOTE — ED Notes (Signed)
Pt had recent abd hyst.  Says she is here for dsg change, Has steri strips in place.  The incision is vertical with separation at lower aspect with drainage that has foul odor. Pt denies fever.or any other problem.

## 2012-10-12 NOTE — ED Notes (Signed)
Iva Lento, PA and Dr Adriana Simas in to see pt.

## 2012-10-12 NOTE — ED Notes (Signed)
Wound cleansed, dressed with 4x4s and taped.  Pt tol. Well.

## 2012-10-14 ENCOUNTER — Ambulatory Visit (HOSPITAL_COMMUNITY)
Admission: RE | Admit: 2012-10-14 | Discharge: 2012-10-14 | Disposition: A | Discharge status: Home / Self Care | Payer: 59 | Source: Ambulatory Visit | Attending: Obstetrics and Gynecology | Admitting: Obstetrics and Gynecology

## 2012-10-14 DIAGNOSIS — T8140XA Infection following a procedure, unspecified, initial encounter: Secondary | ICD-10-CM | POA: Insufficient documentation

## 2012-10-14 DIAGNOSIS — IMO0001 Reserved for inherently not codable concepts without codable children: Secondary | ICD-10-CM | POA: Insufficient documentation

## 2012-10-14 DIAGNOSIS — Y849 Medical procedure, unspecified as the cause of abnormal reaction of the patient, or of later complication, without mention of misadventure at the time of the procedure: Secondary | ICD-10-CM | POA: Insufficient documentation

## 2012-10-14 NOTE — Progress Notes (Signed)
Physical Therapy - Wound Therapy  Evaluation   Patient Details  Name: [name] MRN: [id number] Date of Birth: 03/13/1971  Today's Date: 10/14/2012 Time: 1525-1600 Time Calculation (min): 35 min Charges: 1 eval, 1 deb <20 cm Visit#: 1  of 12   Re-eval: 11/13/12  Subjective Subjective Assessment Subjective: Pt is referred to PT for wound care to her abdomin after her hysterectomy on 09/23/12 with a vertical abdominal inscision.  She reports that on Monday night she started to notice an opening to her incision and had a lot of draining on Tuesday and went to the ED.  They referred to he Dr. Emelda Fear who provided wound care to her abdominal region.   She reports she is currently taking an antibiotic given to her by Dr. Emelda Fear Date of Onset: 10/11/12 Prior Treatments: None   Pain Assessment Pain Assessment Pain Assessment: Faces Pain Score:   3 Pain Location: Abdomen  Wound Therapy Wound 10/14/12 Other (Comment) Abdomen Proximal inscision  (Active)  Site / Wound Assessment Bleeding;Granulation tissue;Yellow 10/14/2012  4:29 PM  % Wound base Red or Granulating 65% 10/14/2012  4:29 PM  % Wound base Yellow 45% 10/14/2012  4:29 PM  Peri-wound Assessment Intact 10/14/2012  4:29 PM  Wound Length (cm) 3.5 cm 10/14/2012  4:29 PM  Wound Width (cm) 1 cm 10/14/2012  4:29 PM  Wound Depth (cm) 3.8 cm 10/14/2012  4:29 PM  Margins Unattacted edges (unapproximated) 10/14/2012  4:29 PM  Drainage Amount Moderate 10/14/2012  4:29 PM  Drainage Description Serosanguineous;Purulent 10/14/2012  4:29 PM  Treatment Cleansed;Hydrotherapy (Pulse lavage) 10/14/2012  4:29 PM  Dressing Type Gauze (Comment);Abdominal binder 10/14/2012  4:29 PM  Dressing Changed New 10/14/2012  4:29 PM  Dressing Status Dry;Clean;Intact 10/14/2012  4:29 PM     Wound 10/14/12 Other (Comment) Abdomen Distal Distal incision  (Active)  % Wound base Red or Granulating 30% 10/14/2012  4:29 PM  % Wound base Yellow 70%  10/14/2012  4:29 PM  Peri-wound Assessment Intact 10/14/2012  4:29 PM  Wound Length (cm) 1.3 cm 10/14/2012  4:29 PM  Wound Width (cm) 0.8 cm 10/14/2012  4:29 PM  Wound Depth (cm) 0.5 cm 10/14/2012  4:29 PM  Margins Unattacted edges (unapproximated) 10/14/2012  4:29 PM  Drainage Amount Minimal 10/14/2012  4:29 PM  Drainage Description Serosanguineous 10/14/2012  4:29 PM  Treatment Cleansed;Hydrotherapy (Pulse lavage) 10/14/2012  4:29 PM  Dressing Type Abdominal binder;Gauze (Comment) 10/14/2012  4:29 PM  Dressing Changed New 10/14/2012  4:29 PM  Dressing Status Clean;Dry;Intact 10/14/2012  4:29 PM     Wound 10/14/12 Other (Comment);Abrasion(s) Abdomen Distal Suprapubic (Active)  % Wound base Red or Granulating 5% 10/14/2012  4:29 PM  % Wound base Yellow 95% 10/14/2012  4:29 PM  Peri-wound Assessment Intact 10/14/2012  4:29 PM  Wound Length (cm) 1.2 cm 10/14/2012  4:29 PM  Wound Width (cm) 0.6 cm 10/14/2012  4:29 PM  Wound Depth (cm) 0.1 cm 10/14/2012  4:29 PM  Margins Attached edges (approximated) 10/14/2012  4:29 PM  Drainage Amount Minimal 10/14/2012  4:29 PM  Drainage Description Serous 10/14/2012  4:29 PM  Treatment Cleansed;Hydrotherapy (Pulse lavage) 10/14/2012  4:29 PM  Dressing Type Abdominal binder;Gauze (Comment) 10/14/2012  4:29 PM  Dressing Changed New 10/14/2012  4:29 PM  Dressing Status Clean;Dry;Intact 10/14/2012  4:29 PM   Hydrotherapy Pulsed lavage therapy - wound location: to all 3 wound locations  Pulsed Lavage with Suction (psi): 4 psi Pulsed Lavage Tip: Tip with splash shield  Physical Therapy Assessment and Plan Wound Therapy - Assess/Plan/Recommendations Wound Therapy - Clinical Statement: Verbal order received from Dr. Emelda Fear to start with wound care.  [name] is referred to PT for wound therapy after her hysterectomy on 09/23/12.  She has 3 locations of wound dehiscene.  She has signficant odor and depth to her wound, worst being proxmial abdominal  wound.  She will benefit from skilled OP PT for wound care. Hydrotherapy Plan: Debridement;Patient/family education;Pulsatile lavage with suction Wound Therapy - Frequency: 3X / week Wound Plan: Continue with PLSV as needed.  Continue to address drainage and odor.      Goals Wound Therapy Goals - Improve the function of patient's integumentary system by progressing the wound(s) through the phases of wound healing by: Decrease Necrotic Tissue to: 0 Decrease Necrotic Tissue - Progress: Goal set today Increase Granulation Tissue to: 10 Increase Granulation Tissue - Progress: Goal set today Decrease Length/Width/Depth by (cm): Wound 1: 1x1x3.8; Wound 2: 1x1x0.5; Wound 3: 1x0.6x0.1 Decrease Length/Width/Depth - Progress: Goal set today Improve Drainage Characteristics: Min Improve Drainage Characteristics - Progress: Goal set today Patient/Family will be able to : independently care for wound Patient/Family Instruction Goal - Progress: Goal set today Goals/treatment plan/discharge plan were made with and agreed upon by patient/family: Yes Time For Goal Achievement: Other (comment) (4 weeks) Wound Therapy - Potential for Goals: Good  Problem List Patient Active Problem List  Diagnosis  . IRREGULAR MENSES  . ELEVATED BLOOD PRESSURE WITHOUT DIAGNOSIS OF HYPERTENSION  . Fibroid uterus  . Lymphedema  . Graves disease  . Chronic blood loss anemia  . Chronic kidney disease  . Positive D dimer  . Menometrorrhagia  . Dehydration  . Acute DVT (deep venous thrombosis)  . Thyroid mass  . Hematoma  . Anticoagulation excessive  . Peripheral edema  . Multinodular goiter  . Lupus anticoagulant positive  . Acute blood loss anemia    GP    Dyllan Hughett 10/14/2012, 4:44 PM

## 2012-10-18 NOTE — ED Provider Notes (Signed)
Medical screening examination/treatment/procedure(s) were performed by non-physician practitioner and as supervising physician I was immediately available for consultation/collaboration.  Donnetta Hutching, MD 10/18/12 [phone]

## 2012-10-19 ENCOUNTER — Ambulatory Visit (HOSPITAL_COMMUNITY)
Admission: RE | Admit: 2012-10-19 | Discharge: 2012-10-19 | Disposition: A | Discharge status: Home / Self Care | Payer: 59 | Source: Ambulatory Visit | Attending: Family Medicine | Admitting: Family Medicine

## 2012-10-19 NOTE — Progress Notes (Signed)
Physical Therapy - Wound Therapy  Treatment   Patient Details  Name: [name] MRN: [id number] Date of Birth: July 23, 1971  Today's Date: 10/19/2012 Time: 1535-1600 Time Calculation (min): 25 min  Visit#: 2  of 12   Re-eval: 11/13/12 Charges: Selective debridement (= or < 20 cm)   Subjective Subjective Assessment Subjective: Pt reports soreness but no pain in wounds today.  Pain Assessment Pain Assessment Pain Assessment: No/denies pain  Wound Therapy Wound 10/14/12 Other (Comment) Abdomen Proximal inscision  (Active)  Site / Wound Assessment Bleeding;Granulation tissue;Yellow 10/19/2012  4:00 PM  % Wound base Red or Granulating 70% 10/19/2012  4:00 PM  % Wound base Yellow 30% 10/19/2012  4:00 PM  Peri-wound Assessment Intact 10/14/2012  4:29 PM  Wound Length (cm) 3.5 cm 10/14/2012  4:29 PM  Wound Width (cm) 1 cm 10/14/2012  4:29 PM  Wound Depth (cm) 3.8 cm 10/14/2012  4:29 PM  Margins Unattacted edges (unapproximated) 10/19/2012  4:00 PM  Drainage Amount Moderate 10/19/2012  4:00 PM  Drainage Description Serosanguineous 10/19/2012  4:00 PM  Treatment Cleansed;Debridement (Selective);Hydrotherapy (Pulse lavage) 10/19/2012  4:00 PM  Dressing Type Gauze (Comment);Hydrogel;ABD;Tape dressing 10/19/2012  4:00 PM  Dressing Changed New 10/19/2012  4:00 PM  Dressing Status Dry;Clean;Intact 10/19/2012  4:00 PM     Wound 10/14/12 Other (Comment) Abdomen Distal Distal incision  (Active)  % Wound base Red or Granulating 50% 10/19/2012  4:00 PM  % Wound base Yellow 50% 10/19/2012  4:00 PM  Peri-wound Assessment Intact 10/14/2012  4:29 PM  Wound Length (cm) 1.3 cm 10/14/2012  4:29 PM  Wound Width (cm) 0.8 cm 10/14/2012  4:29 PM  Wound Depth (cm) 0.5 cm 10/14/2012  4:29 PM  Margins Unattacted edges (unapproximated) 10/19/2012  4:00 PM  Drainage Amount Minimal 10/19/2012  4:00 PM  Drainage Description Serosanguineous 10/19/2012  4:00 PM  Treatment Cleansed;Debridement  (Selective);Hydrotherapy (Pulse lavage) 10/19/2012  4:00 PM  Dressing Type Hydrogel;ABD;Tape dressing 10/19/2012  4:00 PM  Dressing Changed New 10/19/2012  4:00 PM  Dressing Status Clean;Dry;Intact 10/19/2012  4:00 PM     Wound 10/14/12 Other (Comment);Abrasion(s) Abdomen Distal Suprapubic (Active)  % Wound base Red or Granulating 15% 10/19/2012  4:00 PM  % Wound base Yellow 85% 10/19/2012  4:00 PM  Peri-wound Assessment Intact 10/14/2012  4:29 PM  Wound Length (cm) 1.2 cm 10/14/2012  4:29 PM  Wound Width (cm) 0.6 cm 10/14/2012  4:29 PM  Wound Depth (cm) 0.1 cm 10/14/2012  4:29 PM  Margins Attached edges (approximated) 10/19/2012  4:00 PM  Drainage Amount Minimal 10/19/2012  4:00 PM  Drainage Description Serous 10/14/2012  4:29 PM  Treatment Cleansed;Debridement (Selective);Hydrotherapy (Pulse lavage) 10/19/2012  4:00 PM  Dressing Type ABD;Hydrogel;Tape dressing 10/19/2012  4:00 PM  Dressing Changed New 10/19/2012  4:00 PM  Dressing Status Clean;Dry;Intact 10/19/2012  4:00 PM   Hydrotherapy Pulsed lavage therapy - wound location: To all 3 wound locations  Pulsed Lavage with Suction (psi): 4 psi Pulsed Lavage Tip: Tip with splash shield   Physical Therapy Assessment and Plan Wound Therapy - Assess/Plan/Recommendations Wound Therapy - Clinical Statement: Wounds present with minimal odor this session. Increased granulation noted in all wounds. Vaseline applied to wound perimeters to protect skin integrity. Pt is without complaint throughout session. Wound Plan: Continue with wound care per PT POC.       Problem List Patient Active Problem List  Diagnosis  . IRREGULAR MENSES  . ELEVATED BLOOD PRESSURE WITHOUT DIAGNOSIS OF HYPERTENSION  . Fibroid uterus  .  Lymphedema  . Graves disease  . Chronic blood loss anemia  . Chronic kidney disease  . Positive D dimer  . Menometrorrhagia  . Dehydration  . Acute DVT (deep venous thrombosis)  . Thyroid mass  . Hematoma  .  Anticoagulation excessive  . Peripheral edema  . Multinodular goiter  . Lupus anticoagulant positive  . Acute blood loss anemia    Seth Bake, PTA 10/19/2012, 4:26 PM

## 2012-10-21 ENCOUNTER — Inpatient Hospital Stay (HOSPITAL_COMMUNITY): Admission: RE | Admit: 2012-10-21 | Payer: 59 | Source: Ambulatory Visit

## 2012-10-22 ENCOUNTER — Ambulatory Visit (INDEPENDENT_AMBULATORY_CARE_PROVIDER_SITE_OTHER): Payer: 59 | Admitting: Family Medicine

## 2012-10-22 ENCOUNTER — Encounter: Payer: Self-pay | Admitting: Family Medicine

## 2012-10-22 VITALS — BP 128/88 | HR 79 | Resp 15 | Ht 69.0 in | Wt 191.4 lb

## 2012-10-22 DIAGNOSIS — Z7901 Long term (current) use of anticoagulants: Secondary | ICD-10-CM

## 2012-10-22 DIAGNOSIS — R894 Abnormal immunological findings in specimens from other organs, systems and tissues: Secondary | ICD-10-CM

## 2012-10-22 DIAGNOSIS — D500 Iron deficiency anemia secondary to blood loss (chronic): Secondary | ICD-10-CM

## 2012-10-22 DIAGNOSIS — R760 Raised antibody titer: Secondary | ICD-10-CM

## 2012-10-22 DIAGNOSIS — I82409 Acute embolism and thrombosis of unspecified deep veins of unspecified lower extremity: Secondary | ICD-10-CM

## 2012-10-22 DIAGNOSIS — E042 Nontoxic multinodular goiter: Secondary | ICD-10-CM

## 2012-10-22 DIAGNOSIS — D649 Anemia, unspecified: Secondary | ICD-10-CM

## 2012-10-22 DIAGNOSIS — N189 Chronic kidney disease, unspecified: Secondary | ICD-10-CM

## 2012-10-22 DIAGNOSIS — N289 Disorder of kidney and ureter, unspecified: Secondary | ICD-10-CM

## 2012-10-22 DIAGNOSIS — E0500 Thyrotoxicosis with diffuse goiter without thyrotoxic crisis or storm: Secondary | ICD-10-CM

## 2012-10-22 MED ORDER — OXYCODONE-ACETAMINOPHEN 5-325 MG PO TABS: 1.0000 | ORAL_TABLET | ORAL | Status: DC | PRN

## 2012-10-22 NOTE — Assessment & Plan Note (Signed)
Bilateral DVT and M. any DVT. She's on Coumadin therapy. Her last INR was therapeutic. I will recheck her PT/INR today she will be established with the Coumadin clinic

## 2012-10-22 NOTE — Assessment & Plan Note (Signed)
Will await hematology recommendations regarding her she has lupus or not. Also like another recommendations on how long to treat her for the DVTs in setting of a positive anticoagulant

## 2012-10-22 NOTE — Assessment & Plan Note (Signed)
Hemoglobin was much improved at 10.12 weeks ago

## 2012-10-22 NOTE — Assessment & Plan Note (Addendum)
Chronic kidney disease noted in chart. She was to followup with nephrology however she was not aware of this. Since her surgery and resolutions of her acute anemia and dehydration her creatinine and BUN have returned to normal. I will recheck this today along with her PT/INR. Question if renal disease related to the positive lupus anticoagulant. She did have an ultrasound which was benign per nephrology hospital note

## 2012-10-22 NOTE — Patient Instructions (Addendum)
Get labs done in the morning Continue current coumadin dose Coumadin clinic appointment to be arranged, you will be called with appointment time Pain medication refilled F/U 2 months

## 2012-10-22 NOTE — Progress Notes (Signed)
  Subjective:    Patient ID: [name], female    DOB: 1971/07/23, 41 y.o.   MRN: [id number]  HPI Patient here to establish care. No previous PCP. She's had a very complex past few months surrounding anemia and fibroid tumor. She was initially seen in emergency room in June secondary to severe anemia she was admitted and transfused. 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 She returned to the hospital in July and August secondary to complications from anemia requiring more blood transfusions. In September she underwent hysterectomy however ended up with a hematoma in her one which had to be evacuated. She was also found to have bilateral lower extremity DVTs and a DVT in her left upper extremity. Throughout her multiple hospital course the she was also diagnosed with Graves' disease and is currently following with endocrinology Dr. Domenic Schwab. She is due to have thyroid surgery by Dr. Lovell Sheehan in the next few weeks. She was also noted in August to have acute on chronic renal disease. However she does not had a PCP so was not aware of any previous renal problems. Her last the last for a reviewed and her creatinine and BUN were in the normal range. Workup for her anemia she had a positive ANA and this is concerning with her development of blood clots. She has followup with hematology and is currently on Coumadin therapy. She is on inderal however this is causing dizziness so she has not taken the past few days  Review of Systems  GEN- + fatigue, fever, weight loss,weakness, recent illness HEENT- denies eye drainage, change in vision, nasal discharge, CVS- denies chest pain, palpitations RESP- denies SOB, cough, wheeze ABD- denies N/V, change in stools, abd pain GU- denies dysuria, hematuria, dribbling, incontinence MSK- denies joint pain, muscle aches, injury Neuro- denies headache, +dizziness, syncope, seizure activity      Objective:   Physical Exam GEN- NAD, alert and oriented x3 HEENT-  PERRL, EOMI, non injected sclera, bulging eyes pink conjunctiva, MMM, oropharynx clear Neck- Supple, + thyromegaly CVS- RRR, no murmur RESP-CTAB ABS-NABS,soft, mild TTP over incision- midline incision with small opening beneath umbilicus with packing, open lesion in midline incision beneath panus, small amount of serosanguinous dressing EXT- trace edema Pulses- Radial 2+ Psych-normal affect and Mood        Assessment & Plan:

## 2012-10-22 NOTE — Assessment & Plan Note (Signed)
As she is symptomatic from the propanolol other not hypotensive she has had some severe anemia in the past few months. I will decrease her propanolol to 10 mg twice a day I think she does need a beta blockade to help with palpitations due to the overactive thyroid.

## 2012-10-22 NOTE — Assessment & Plan Note (Signed)
Followup with endocrinology and surgery for removal of thyroid

## 2012-10-23 LAB — ESTIMATED GFR: GFR, Est Non African American: 85 mL/min

## 2012-10-23 LAB — CBC
HCT: 38.1 % (ref 36.0–46.0)
Platelets: 318 10*3/uL (ref 150–400)
RDW: 19 % — ABNORMAL HIGH (ref 11.5–15.5)
WBC: 5.6 10*3/uL (ref 4.0–10.5)

## 2012-10-23 LAB — PROTIME-INR
INR: 1.55 — ABNORMAL HIGH (ref <1.50)
Prothrombin Time: 18.3 seconds — ABNORMAL HIGH (ref 11.6–15.2)

## 2012-10-23 LAB — BASIC METABOLIC PANEL
Calcium: 9.5 mg/dL (ref 8.4–10.5)
Chloride: 105 mEq/L (ref 96–112)
Creat: 0.84 mg/dL (ref 0.50–1.10)

## 2012-10-25 ENCOUNTER — Encounter (HOSPITAL_COMMUNITY): Payer: 59 | Attending: Oncology | Admitting: Oncology

## 2012-10-25 ENCOUNTER — Inpatient Hospital Stay (HOSPITAL_COMMUNITY): Admission: RE | Admit: 2012-10-25 | Payer: 59 | Source: Ambulatory Visit | Admitting: Physical Therapy

## 2012-10-25 VITALS — BP 144/99 | HR 85 | Temp 98.4°F | Resp 18 | Ht 67.5 in | Wt 187.0 lb

## 2012-10-25 DIAGNOSIS — I82409 Acute embolism and thrombosis of unspecified deep veins of unspecified lower extremity: Secondary | ICD-10-CM

## 2012-10-25 DIAGNOSIS — D500 Iron deficiency anemia secondary to blood loss (chronic): Secondary | ICD-10-CM

## 2012-10-25 DIAGNOSIS — R599 Enlarged lymph nodes, unspecified: Secondary | ICD-10-CM

## 2012-10-25 NOTE — Addendum Note (Signed)
Addended by: Abner Greenspan on: 10/25/2012 10:42 AM   Modules accepted: Orders

## 2012-10-25 NOTE — Progress Notes (Signed)
Problem #1 DVTs of the left leg left arm and right arm in the setting of sepsis, necrotic fibroid tumor of the uterus, abscess of the uterus, and extensive blood loss. She also suffered prostate 40-60 pound weight loss in the last 6 months to 7 months. She also had a PICC line in the right arm at the site of the DVT. She was found to have a lupus anticoagulant with a concomitant anticardiolipin IgG antibody. All this in the setting of a very acute illness. She also required a number of blood transfusions due to severe bleeding. She also had an hematoma in her incision which had to be drained she states by Dr. Emelda Fear. Problem #2 possible hyperthyroidism. Problem #3 iron deficiency secondary to GU blood losses Pleasant lady who was very overweight at 270+ pounds until she started losing weight in March. Should trouble swallowing or foods because of a huge mass emanating from the uterus. It was necrotic worrisome for possible degeneration into cancer. She was operated on by Dr. Emelda Fear. This procedure and this illness her complicated by significant blood loss from her menses etc. She also appeared to be infected and in this setting developed DVTs in the above-mentioned areas.  She is no family history of DVTs or blood clots. She was not on prior birth control pills etc. She was sent home at the time of discharge on warfarin though she was not therapeutic when last checked. She was taking 5 mg of warfarin daily. I have changed that to 7.5 mg for 2 days and she will return to alternating 5 mg with 7.5 mg. We will check an INR this coming Monday She has never been pregnant but has been married for many years. 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  She is nonsmoker. She states that she has had bulging nice ever since she was young and that this runs in her family she has been felt to be hyperthyroid though her laboratory studies are not necessarily confirmatory presently. She has 2 lesions in  the thyroid gland which are said to be in the process of being evaluated for removal of the entire gland.  Vital signs are recorded she would now weighs 187 pounds. She is in no acute distress. She does have very bulging eyes. She does have enlargement of both sides of her thyroid gland. I believe there is a several centimeter nodule on the left. It is not rock hard. She has no associated lymphadenopathy in any location. Lungs are clear. Heart shows a regular rhythm and rate. There is no murmur or gallop. Abdomen is soft and nontender there is a midline incision covered by bandage with small amount of discharge. It was changed yesterday by Dr. Emelda Fear and I did not remove it. She states that she is dressing it daily with Neosporin. Bowel sounds are normal. She has no leg edema no arm edema. Pulses 1-2+ and symmetrical. His poor dental hygiene with numerous teeth are broken off at the base. Tongue is unremarkable. Nails are unremarkable. She does not crave ice. She is alert and oriented. Facial symmetry is intact.  The first issue is her blood clots which are most likely from her acute illness and the development of a lupus anticoagulant and sepsis. The second issue is possible iron deficiency. She is very constipated on the iron 3 times a day so I will change that to once a day for the duration of her 90 pills. We will then check  her iron studies and CBC in several months.  I suspect the lupus anticoagulant is a transient development and hopefully will disappear. I would treat her only with 6 months of Coumadin. We will manage her Coumadin. We will see her in 4-6 weeks. She is due to see Dr. Franky Macho in the near future

## 2012-10-25 NOTE — Patient Instructions (Addendum)
[facility]  Discharge Instructions  RECOMMENDATIONS MADE BY THE CONSULTANT AND ANY TEST RESULTS WILL BE SENT TO YOUR REFERRING DOCTOR.   EXAM FINDINGS BY MD TODAY AND SIGNS AND SYMPTOMS TO REPORT TO CLINIC OR PRIMARY MD: Exam and discussion by Dr. Mariel Sleet.  You have what is called a Lupus Anticoagulant but you do not have lupus.  This usually occurs with serious illnesses.  You will need to be on coumadin for at least 1 year.    MEDICATIONS PRESCRIBED: Coumadin for the next 2 days take 7.5mg  daily then you will take 5 mg alternating with 7.5mg  each afternoon.  Decrease your iron pill to 1 pill daily and we will check your iron studies in January.  INSTRUCTIONS GIVEN AND DISCUSSED: Other :  Report unusual bruising or bleeding or other problems.  SPECIAL INSTRUCTIONS/FOLLOW-UP: Lab work Needed on Monday morning and Return to Clinic in 1 month for follow-up.   I acknowledge that I have been informed and understand all the instructions given to me and received a copy. I do not have any more questions at this time, but understand that I may call the Specialty Clinic at [facility] at [phone] during business hours should I have any further questions or need assistance in obtaining follow-up care.    __________________________________________  _____________  __________ Signature of Patient or Authorized Representative            Date                   Time    __________________________________________ Nurse's Signature

## 2012-10-27 ENCOUNTER — Ambulatory Visit (HOSPITAL_COMMUNITY): Payer: 59 | Admitting: *Deleted

## 2012-10-29 ENCOUNTER — Ambulatory Visit (HOSPITAL_COMMUNITY): Payer: 59 | Admitting: Physical Therapy

## 2012-11-01 ENCOUNTER — Encounter (HOSPITAL_COMMUNITY): Payer: 59 | Attending: Oncology

## 2012-11-01 ENCOUNTER — Ambulatory Visit: Payer: 59 | Admitting: Internal Medicine

## 2012-11-01 DIAGNOSIS — D500 Iron deficiency anemia secondary to blood loss (chronic): Secondary | ICD-10-CM

## 2012-11-01 DIAGNOSIS — I82409 Acute embolism and thrombosis of unspecified deep veins of unspecified lower extremity: Secondary | ICD-10-CM | POA: Insufficient documentation

## 2012-11-01 LAB — PROTIME-INR: INR: 2.01 — ABNORMAL HIGH (ref 0.00–1.49)

## 2012-11-01 NOTE — Progress Notes (Signed)
Labs drawn today for pt 

## 2012-11-08 ENCOUNTER — Encounter (HOSPITAL_COMMUNITY): Payer: 59

## 2012-11-08 DIAGNOSIS — D500 Iron deficiency anemia secondary to blood loss (chronic): Secondary | ICD-10-CM

## 2012-11-08 DIAGNOSIS — I82409 Acute embolism and thrombosis of unspecified deep veins of unspecified lower extremity: Secondary | ICD-10-CM

## 2012-11-08 LAB — PROTIME-INR
INR: 2.64 — ABNORMAL HIGH (ref 0.00–1.49)
Prothrombin Time: 26.9 seconds — ABNORMAL HIGH (ref 11.6–15.2)

## 2012-11-08 NOTE — Progress Notes (Signed)
Labs drawn today for pt 

## 2012-11-16 ENCOUNTER — Encounter (HOSPITAL_COMMUNITY): Payer: 59

## 2012-11-16 DIAGNOSIS — I82409 Acute embolism and thrombosis of unspecified deep veins of unspecified lower extremity: Secondary | ICD-10-CM

## 2012-11-16 NOTE — Progress Notes (Signed)
Labs drawn today for pt 

## 2012-11-23 ENCOUNTER — Encounter (HOSPITAL_COMMUNITY): Payer: Self-pay | Admitting: Oncology

## 2012-11-23 ENCOUNTER — Encounter (HOSPITAL_BASED_OUTPATIENT_CLINIC_OR_DEPARTMENT_OTHER): Payer: 59 | Admitting: Oncology

## 2012-11-23 VITALS — BP 119/84 | HR 84 | Temp 98.9°F | Resp 18 | Wt 204.7 lb

## 2012-11-23 DIAGNOSIS — I82629 Acute embolism and thrombosis of deep veins of unspecified upper extremity: Secondary | ICD-10-CM

## 2012-11-23 DIAGNOSIS — R894 Abnormal immunological findings in specimens from other organs, systems and tissues: Secondary | ICD-10-CM

## 2012-11-23 DIAGNOSIS — R760 Raised antibody titer: Secondary | ICD-10-CM

## 2012-11-23 DIAGNOSIS — I82409 Acute embolism and thrombosis of unspecified deep veins of unspecified lower extremity: Secondary | ICD-10-CM

## 2012-11-23 NOTE — Patient Instructions (Addendum)
[facility]  Discharge Instructions  RECOMMENDATIONS MADE BY THE CONSULTANT AND ANY TEST RESULTS WILL BE SENT TO YOUR REFERRING DOCTOR.   EXAM FINDINGS BY MD TODAY AND SIGNS AND SYMPTOMS TO REPORT TO CLINIC OR PRIMARY MD: Exam and discussion by PA.  No changes at present.  MEDICATIONS PRESCRIBED: none   INSTRUCTIONS GIVEN AND DISCUSSED: Other :  Report unusual bruising or bleeding.  SPECIAL INSTRUCTIONS/FOLLOW-UP: Lab work Needed as scheduled for your next INR and Return to Clinic in 3 months to see PA.   I acknowledge that I have been informed and understand all the instructions given to me and received a copy. I do not have any more questions at this time, but understand that I may call the Specialty Clinic at [facility] at [phone] during business hours should I have any further questions or need assistance in obtaining follow-up care.    __________________________________________  _____________  __________ Signature of Patient or Authorized Representative            Date                   Time    __________________________________________ Nurse's Signature

## 2012-11-23 NOTE — Progress Notes (Signed)
Debra Antis, MD [address] [place] [address]  1. Acute DVT (deep venous thrombosis)   2. Lupus anticoagulant positive     CURRENT THERAPY: Anticoagulated with Coumadin.  7.5 mg Monday, Thursday, and Saturday.  5 mg all other days.   INTERVAL HISTORY: [name] 41 y.o. 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 She also had a PICC line in the right arm at the site of the DVT.   It is recommended by Dr. Mariel Sleet that she be anticoagulated with Coumadin for 6 months.  Hopefully this is a transient development in light of her acute illness.  She began anticoagulation on 09/29/2012.  She is planned to complete anticoagulation at the beginning of April 2014.  She is scheduled to undergo cataract surgery on Monday, 11/29/2012.  She reports that the ophthalmologist will not be stopping her anticoagulation for this procedure.   She is scheduled to see Dr. Lovell Sheehan next week with regards to her Thyroid.  So the patient was encouraged to continue her Coumadin daily as she has been doing.  She will follow-up with INR as scheduled.   She denies any complaints.  She denies any blood loss.  She denies any blood in stool, black tarry stools, hematuria, gingival bleeding and vaginal bleeding.  She is tolerating her Coumadin well.   Complete ROS questioning is negative.   Past Medical History  Diagnosis Date  . Thyroid disease   . Fibroid tumor   . DVT (deep venous thrombosis) 09/30/12    in upper extremities b/l and LLE  . Anemia   . Thyroid nodule     s/p biopsy  . Graves disease   . Multinodular goiter 10/01/2012  . Lupus anticoagulant positive 10/02/2012    has Fibroid uterus; Graves disease;  Chronic blood loss anemia; Chronic kidney disease; Positive D dimer; Acute DVT (deep venous thrombosis); Thyroid mass; Multinodular goiter; and Lupus anticoagulant positive on her problem list.      has no known allergies.  [name] had no medications administered during this visit.  Past Surgical History  Procedure Date  . Supraclavical abdominal hysterectomy 09/27/2012    Procedure: HYSTERECTOMY SUPRACERVICAL ABDOMINAL;  Surgeon: Tilda Burrow, MD;  Location: [facility];  Service: Gynecology;  Laterality: N/A;  . Hematoma evacuation 10/03/2012    Procedure: EVACUATION HEMATOMA;  Surgeon: Tilda Burrow, MD;  Location: [facility];  Service: Gynecology;  Laterality: N/A;    Denies any headaches, dizziness, double vision, fevers, chills, night sweats, nausea, vomiting, diarrhea, constipation, chest pain, heart palpitations, shortness of breath, blood in stool, black tarry stool, urinary pain, urinary burning, urinary frequency, hematuria.   PHYSICAL EXAMINATION  ECOG PERFORMANCE STATUS: 0 - Asymptomatic  Filed Vitals:   11/23/12 1000  BP: 119/84  Pulse: 84  Temp: 98.9 F (37.2 C)  Resp: 18    GENERAL:alert, no distress, well nourished, well developed, comfortable, cooperative and smiling SKIN: skin color, texture, turgor are normal, no rashes or significant lesions HEAD: Normocephalic, No masses, lesions, tenderness or abnormalities EYES: normal, Conjunctiva are pink and non-injected EARS: External ears normal OROPHARYNX:no exudate, no erythema, lips, buccal mucosa, and tongue normal and mucous membranes are moist  NECK: supple, no adenopathy,  thyromegaly noted,  trachea midline LYMPH:  no palpable lymphadenopathy, no hepatosplenomegaly BREAST:not examined LUNGS: clear to auscultation and percussion HEART: regular rate & rhythm, no murmurs, no gallops, S1 normal and S2 normal ABDOMEN:abdomen soft, non-tender, obese, normal bowel sounds, no masses or organomegaly and no  hepatosplenomegaly. Abdominal scars well healed.  BACK: Back symmetric, no curvature., No CVA tenderness EXTREMITIES:less then 2 second capillary refill, no joint deformities, effusion, or inflammation, no edema, no skin discoloration, no clubbing, no cyanosis. No erythema, heat, or pain of LE. NEURO: alert & oriented x 3 with fluent speech, no focal motor/sensory deficits, gait normal    LABORATORY DATA: CBC    Component Value Date/Time   WBC 5.6 10/22/2012 1455   RBC 4.73 10/22/2012 1455   HGB 12.4 10/22/2012 1455   HCT 38.1 10/22/2012 1455   PLT 318 10/22/2012 1455   MCV 80.5 10/22/2012 1455   MCH 26.2 10/22/2012 1455   MCHC 32.5 10/22/2012 1455   RDW 19.0* 10/22/2012 1455   LYMPHSABS 1.1 08/31/2012 0507   MONOABS 0.6 08/31/2012 0507   EOSABS 0.1 08/31/2012 0507   BASOSABS 0.0 08/31/2012 0507      Chemistry      Component Value Date/Time   NA 137 10/22/2012 1455   K 3.8 10/22/2012 1455   CL 105 10/22/2012 1455   CO2 24 10/22/2012 1455   BUN 17 10/22/2012 1455   CREATININE 0.84 10/22/2012 1455   CREATININE 1.02 10/04/2012 0450      Component Value Date/Time   CALCIUM 9.5 10/22/2012 1455   ALKPHOS 78 09/28/2012 0439   AST 8 09/28/2012 0439   ALT 6 09/28/2012 0439   BILITOT 0.7 09/28/2012 0439     Lab Results  Component Value Date   INR 2.79* 11/16/2012   INR 2.64* 11/08/2012   INR 2.01* 11/01/2012    RADIOGRAPHIC STUDIES: 09/28/2012  *RADIOLOGY REPORT*  Clinical Data: Thyroid nodule  THYROID ULTRASOUND  Technique: Ultrasound examination of the thyroid gland and adjacent  soft tissues was performed.  Comparison: None.  Findings:  Right thyroid lobe: 23 x 33 x 15 mm, inhomogeneous echotexture  Left thyroid lobe: 29 x 31 x 55 mm  Isthmus: 1.2 cm in thickness  Focal nodules: 18 x 20 x 31 mm solid, mid-right  25 x 29 x 50 mm solid, mid-left  There are additional much smaller nodules bilaterally.  Lymphadenopathy: None visualized.  IMPRESSION:  Bilateral thyroid  nodules. The dominant left and right lesions  meet consensus criteria for biopsy. Ultrasound-guided fine needle  aspiration should be considered, as per the consensus statement:  Management of Thyroid Nodules Detected at [place]: Society of  Radiologists in Ultrasound Consensus Conference Statement.  Radiology 2005; [id number].  Original Report Authenticated By: Osa Craver, M.D.    *RADIOLOGY REPORT*  Clinical Data: PICC line in place. Arm swelling. Pain. Edema.  RIGHT UPPER EXTREMITY VENOUS DUPLEX ULTRASOUND  Technique: Gray-scale sonography with graded compression, as well  as color Doppler and duplex ultrasound were performed to evaluate  the upper extremity deep venous system from the level of the  subclavian vein and including the jugular, axillary, basilic and  upper cephalic vein. Spectral Doppler was utilized to evaluate  flow at rest and with distal augmentation maneuvers.  Comparison: None.  Findings: Normal color signal and wave forms in the visualized  portions of right internal jugular vein.  There is diffuse partially occlusive thrombus along the left arm  PICC line extending through visualized portions of right brachial  and axillary veins. There is also partially occlusive thrombus  noted in the right basilic and radial veins and partial thrombosis  in the right cephalic vein below the elbow. Ulnar vein is  unremarkable in its visualized segments. Subcutaneous edema is  evident in the forearm.  IMPRESSION:  1. Right upper extremity DVT as above.  Original Report Authenticated By: Osa Craver, M.D.    *RADIOLOGY REPORT*  Clinical Data: Pain, swelling, history DVT.  BILATERAL LOWER EXTREMITY VENOUS DOPPLER ULTRASOUND  Technique: Gray-scale sonography with compression, as well as color  and duplex ultrasound, were performed to evaluate the deep venous  system from the level of the common femoral vein through the  popliteal and proximal calf  veins.  Comparison: 08/29/2012  Findings: On the right, normal compressibility of the common  femoral, superficial femoral, and popliteal veins, as well as the  proximal calf veins. No filling defects to suggest DVT on  grayscale or color Doppler imaging. Doppler waveforms show normal  direction of venous flow, normal respiratory phasicity and response  to augmentation.  On the left, there is partially occlusive noncompressible thrombus  in the popliteal vein. The femoral vein, profunda femoris, and  common femoral vein as well as the saphenofemoral junction remain  widely patent without central extension of thrombus.  IMPRESSION:  1. Left popliteal DVT.  2. No evidence of right lower extremity DVT.  Original Report Authenticated By: Osa Craver, M.D.      09/27/2012  *RADIOLOGY REPORT*  Clinical Data: Left arm pain and swelling. History of a central  line on the right.  UPPER LEFT VENOUS EXTREMITY ULTRASOUND  Comparison: None.  Findings: The left brachial vein is filled with hypoechoic material  with no internal blood flow seen with color Doppler or pulsed  Doppler. The vein did not compress normally.  The remainder of the examined deep veins of the left arm have  normal appearances, including the left subclavian and jugular  veins. No additional thrombi were seen.  There is a large heterogeneous mass in the left lobe of the thyroid  gland, with solid and cystic components. This is predominately  solid and measures 4.9 x 3.5 x 2.9 cm in maximum dimensions.  Also demonstrated are mildly prominent lymph nodes in the lateral  neck on the left. The largest has a short axis diameter of 5 mm.  IMPRESSION:  1. Deep venous thrombus completely occluding the left brachial  vein.  2. 4.9 cm left lobe thyroid nodule. This is suspicious for  malignancy and will require biopsy correlation. An elective  dedicated thyroid ultrasound is recommended.  3. Mildly prominent left  neck lymph nodes laterally.  Original Report Authenticated By: Darrol Angel, M.D.    PATHOLOGY: 09/28/2012 Adequacy Reason Satisfactory For Evaluation. 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 Jimmy Picket MD Pathologist, Electronic Signature (Case signed 09/30/2012)    Diagnosis THYROID, FINE NEEDLE ASPIRATION, LEFT: FOLLICULAR LESION OF UNDETERMINED SIGNIFICANCE. COMMENT: THE DIFFERENTIAL INCLUDES A HYPERPLASIC LESION AND AN ADENOMATOUS NODULE. Jimmy Picket MD Pathologist, Electronic Signature (Case signed 09/30/2012)     09/27/2012  Diagnosis Uterus, and left paratubal cyst - LEIOMYOMATA AND MYOMETRIAL ACUTE ABSCESS. - ENDOMETRIUM: CHRONIC ENDOMETRITIS AND PROLIFERATIVE ENDOMETRIUM, NO HYPERPLASIA, ATYPIA OR MALIGNANCY. - UTERINE SEROSA: ADHESIONS, NO EVIDENCE OF ENDOMETRIOSIS, ATYPIA OR MALIGNANCY. - BENIGN PARATUBAL CYSTS, NO ATYPIA OR MALIGNANCY. Abigail Miyamoto MD Pathologist, Electronic Signature (Case signed 09/28/2012)  ASSESSMENT:  1. 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 2. DVTs of the left leg left arm and right arm in the setting of sepsis, necrotic fibroid tumor of the uterus, abscess of the uterus, and extensive blood loss. She also had a PICC line in the right arm at the site of the DVT.  3. S/P a number of blood transfusions due to severe bleeding.  4. Hematoma in her incision which had to be drained she states by Dr. Emelda Fear.  5. 40-60 pound weight loss in the last 6 months to 7 months.  6. Possible hyperthyroidism.  7. 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 Thyroid nodules,     PLAN:  1. I personally reviewed and went over laboratory results with the patient. 2. I personally reviewed and went over radiographic studies with the patient. 3. I personally reviewed and went over pathology results  with the patient. 4. Continue with Coumadin anticoagulation for 6 months from initiation.  She started October 1st or 2nd of 2013.  She will complete 6 months of anticoagulation in April 2014. 5. INR as scheduled 6. She reports that she is taking 7.5 mg on Monday and Thursday and 5 mg all other days.  7. Will repeat a hypercoagulable panel 2 weeks after ceasing Coumadin. 8. She will have her cataract surgeon on Monday Dec 2 as scheduled.  9. She will see Dr. Lovell Sheehan (Gen Surg) as scheduled next week. 10. Will see her back at the end of March 2014 and arrange a D/C date for Coumadin and schedule her for a 2 week lab appointment thereafter for hypercoag panel and iron studies.   All questions were answered. The patient knows to call the clinic with any problems, questions or concerns. We can certainly see the patient much sooner if necessary.  The patient and plan discussed with Glenford Peers, MD and he is in agreement with the aforementioned.  Bowyn Mercier

## 2012-11-30 ENCOUNTER — Encounter (HOSPITAL_COMMUNITY): Payer: 59 | Attending: Oncology

## 2012-11-30 DIAGNOSIS — D500 Iron deficiency anemia secondary to blood loss (chronic): Secondary | ICD-10-CM | POA: Insufficient documentation

## 2012-11-30 DIAGNOSIS — I82409 Acute embolism and thrombosis of unspecified deep veins of unspecified lower extremity: Secondary | ICD-10-CM | POA: Insufficient documentation

## 2012-11-30 NOTE — Progress Notes (Signed)
Labs drawn today for pt 

## 2012-11-30 NOTE — H&P (Signed)
  NTS SOAP Note  Vital Signs:  Vitals as of: 11/02/2012: Systolic 144: Diastolic 100: Heart Rate 92: Temp 97.50F: Height 67ft 7in: Weight 193Lbs 0 Ounces: BMI 30  BMI : 30.23 kg/m2  Subjective: This 41 Years 64 Months old Female presents for of    THYROID NODULE: ,Found on PE to have an enlarged thyroid gland.  FNA reveals Hurthle cell neoplasm.  Does have some hyperthyroidism, followed by Dr. Fransico Him, endocrinology, who has referred patient for thyroidectomy due to increased risk of cancer.  Underwent hysterectomy recently, with postop DVT developing.  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  Started on coumadin, followed by Dr. Mariel Sleet, Heme-onc.  Patient states she does have feel hot and cold on occassion.  No heart palpitations noted.  No voice changes noted.  Review of Symptoms:    hot/cold Head:unremarkable    Eyes:unremarkable   Cardiovascular:  unremarkable   Respiratory:unremarkable   Gastrointestinal:  unremarkable   Genitourinary:unremarkable     Musculoskeletal:unremarkable   Skin:unremarkable Allergic/Immunologic:unremarkable     Past Medical History:    Reviewed   Past Medical History  Surgical History: hysterectomy with postop hematoma Medical Problems: DVT, HTN Allergies: nkda Medications: iron pills, coumadin, propranolol   Social History:Reviewed  Social History  Preferred Language: English Race:  Black or African American Ethnicity: Not Hispanic / Latino Age: 41 Years 4 Months Marital Status:  M Alcohol: unknown Recreational drug(s):  No   Smoking Status: Unknown if ever smoked reviewed on 11/02/2012    Family History:  Reviewed   Family History  Is there a family history BJ:[id number], diabetes    Objective Information: General:  Well appearing, well nourished in no distress.   Diffusely enlarge thyroid gland, no lymphadneopathy noted.  No tracheal deviation noted. Heart:  RRR, no  murmur or gallop.  Normal S1, S2.  No S3, S4.  Lungs:    CTA bilaterally, no wheezes, rhonchi, rales.  Breathing unlabored.  Assessment:Hurthle cell tumor, thyroid  Diagnosis &amp; Procedure:    Plan:  Scheduled for total thyroidectomy on 11/13/12.  Risks and benefits of procedure including bleeding, infection, nerve injury, and voice changes were fully explained to the patient, who gives informed consent.

## 2012-12-02 ENCOUNTER — Ambulatory Visit: Payer: 59 | Admitting: Internal Medicine

## 2012-12-02 ENCOUNTER — Encounter (HOSPITAL_COMMUNITY): Payer: Self-pay | Admitting: Pharmacy Technician

## 2012-12-03 NOTE — Patient Instructions (Addendum)
18 [name]  12/03/2012   Your procedure is scheduled on:  12/13/2012  Report to [facility] at  615  AM.  Call this number if you have problems the morning of surgery: [phone]   Remember:   Do not eat food:After Midnight.  May have clear liquids:until Midnight .   Take these medicines the morning of surgery with A SIP OF WATER: labetolol,inderal   Do not wear jewelry, make-up or nail polish.  Do not wear lotions, powders, or perfumes.   Do not shave 48 hours prior to surgery. Men may shave face and neck.  Do not bring valuables to the hospital.  Contacts, dentures or bridgework may not be worn into surgery.  Leave suitcase in the car. After surgery it may be brought to your room.  For patients admitted to the hospital, checkout time is 11:00 AM the day of discharge.   Patients discharged the day of surgery will not be allowed to drive home.  Name and phone number of your driver: family Special Instructions: Shower using CHG 2 nights before surgery and the night before surgery.  If you shower the day of surgery use CHG.  Use special wash - you have one bottle of CHG for all showers.  You should use approximately 1/3 of the bottle for each shower.   Please read over the following fact sheets that you were given: Pain Booklet, Coughing and Deep Breathing, MRSA Information and Surgical Site Infection Prevention Thyroidectomy Thyroidectomy is the removal of part or all of your thyroid gland. Your thyroid gland is a butterfly-shaped gland at the base of your neck. It produces a substance called thyroid hormone, which regulates the physical and chemical processes that keep your body functioning and make energy available to your body (metabolism). The amount of thyroid gland tissue that is removed during a thyroidectomy depends on the reason for the procedure. Typically, if only a part of your gland is removed, enough thyroid gland tissue remains to maintain normal function. If  your entire thyroid gland is removed or if the amount of thyroid gland tissue remaining is inadequate to maintain normal function, you will need life-long treatment with thyroid hormone on a daily basis. Thyroidectomy maybe performed when you have the following conditions:  Thyroid nodules. These are small, abnormal collections of tissue that form inside the thyroid gland. If these nodules begin to enlarge at a rapid rate, a sample of tissue from the nodule is taken through a needle and examined (needle biopsy). This is done to determine if the nodules are cancerous. Depending on the outcome of this exam, thyroidectomy may be necessary.  Thyroid cancer.  Goiter, which is an enlarged thyroid gland. All or part of the thyroid gland may be removed if the gland has become so large that it causes difficulty breathing or swallowing.  Hyperthyroidism. This is when the thyroid gland produces too much thyroid hormone. Hypothyroidism can cause symptoms of fluctuating weight, intolerance to heat, irritability, shortness of breath, and chest pain. LET YOUR CAREGIVER KNOW ABOUT:   Allergies to food or medicine.  Medicines that you are taking, including vitamins, herbs, eyedrops, over-the-counter medicines, and creams.  Previous problems you have had with anesthetics or numbing medicines.  History of bleeding problems or blood clots.  Previous surgeries you have had.  Other health problems, including diabetes and kidney problems, you have had.  Possibility of pregnancy, if this applies. BEFORE THE PROCEDURE   Do not eat or drink  anything, including water, for at least 6 hours before the procedure.  Ask your caregiver whether you should stop taking certain medicines before the day of the procedure. PROCEDURE  There are different ways that thyroidectomy is performed. For each type, you will be given a medicine to make you sleep (general anesthetic). The three main types of thyroidectomy are listed as  follows:  Conventional thyroidectomy A cut (incision) in the center portion of your lower neck is made with a scalpel. Muscles below your skin are separated to gain access to your thyroid gland. Your thyroid gland is dissected from your windpipe (trachea). Often a drain is placed at the incision site to drain any blood that accumulates under the skin after the procedure. This drain will be removed before you go home. The wound from the incision should heal within 2 weeks.  Endoscopic thyroidectomy Small incisions are made in your lower neck. A small instrument (endoscope) is inserted under your skin at the incision sites. The endoscope used for thyroidectomy consists of 2 flexible tubes. Inside one of the tubes is a video camera that is used to guide the Careers adviser. Tools to remove the thyroid gland, including a tool to cut the gland (dissectors) and a suction device, are inserted through the other tube. The surgeon uses the dissectors to dissect the thyroid gland from the trachea and remove it.  Robotic thyroidectomy This procedure allows your thyroid gland to be removed through incisions in your armpit, your chest, or high in your neck. Instruments similar to endoscopes provide a 3-dimensional picture of the surgical site. Dissecting instruments are controlled by devices similar to joysticks. These devices allow more accurate manipulation of the instruments. After the blood supply to the gland is removed, the gland is cut into several pieces and removed through the incisions. RISKS AND COMPLICATIONS Complications associated with thyroidectomy are rare, but they can occur. Possible complications include:  A decrease in parathyroid hormone levels (hypoparathyroidism) Your parathyroid glands are located close behind your thyroid gland. They are responsible for maintaining calcium levels inthe body. If they are damaged or removed, levels of calcium in the blood become low and nerves become irritable, which can  cause muscle spasms. Medicines are available to treat this.  Bacterial infection This can often be treated with medicines that kill bacteria (antibiotics).  Damage to your voice box nerves This could cause hoarseness or complete loss of voice.  Bleeding or airway obstruction. AFTER THE PROCEDURE   You will rest in the recovery room as you wake up.  When you first wake up, your throat may feel slightly sore.  You will not be allowed to eat or drink until instructed otherwise.  You will be taken to your hospital room. You will usually stay at the hospital for 1 or 2 nights.  If a drain is placed during the procedure, it usually is removed the next day.  You may have some mild neck pain.  Your voice may be weak. This usually is temporary. Document Released: 06/10/2001 Document Revised: 03/08/2012 Document Reviewed: 03/19/2011 [facility] Patient Information 2013 [place], [place]. PATIENT INSTRUCTIONS POST-ANESTHESIA  IMMEDIATELY FOLLOWING SURGERY:  Do not drive or operate machinery for the first twenty four hours after surgery.  Do not make any important decisions for twenty four hours after surgery or while taking narcotic pain medications or sedatives.  If you develop intractable nausea and vomiting or a severe headache please notify your doctor immediately.  FOLLOW-UP:  Please make an appointment with your surgeon as  instructed. You do not need to follow up with anesthesia unless specifically instructed to do so.  WOUND CARE INSTRUCTIONS (if applicable):  Keep a dry clean dressing on the anesthesia/puncture wound site if there is drainage.  Once the wound has quit draining you may leave it open to air.  Generally you should leave the bandage intact for twenty four hours unless there is drainage.  If the epidural site drains for more than 36-48 hours please call the anesthesia department.  QUESTIONS?:  Please feel free to call your physician or the hospital operator if you have any  questions, and they will be happy to assist you.

## 2012-12-06 ENCOUNTER — Encounter (HOSPITAL_COMMUNITY): Payer: Self-pay

## 2012-12-06 ENCOUNTER — Encounter (HOSPITAL_COMMUNITY)
Admission: RE | Admit: 2012-12-06 | Discharge: 2012-12-06 | Disposition: A | Discharge status: Home / Self Care | Payer: 59 | Source: Ambulatory Visit | Attending: General Surgery | Admitting: General Surgery

## 2012-12-06 HISTORY — DX: Essential (primary) hypertension: I10

## 2012-12-06 LAB — CBC WITH DIFFERENTIAL/PLATELET
Basophils Absolute: 0 10*3/uL (ref 0.0–0.1)
Eosinophils Absolute: 0.1 10*3/uL (ref 0.0–0.7)
Eosinophils Relative: 2 % (ref 0–5)
HCT: 40.1 % (ref 36.0–46.0)
Lymphocytes Relative: 26 % (ref 12–46)
MCH: 28.9 pg (ref 26.0–34.0)
MCV: 84.6 fL (ref 78.0–100.0)
Monocytes Absolute: 0.3 10*3/uL (ref 0.1–1.0)
Platelets: 193 10*3/uL (ref 150–400)
RDW: 14.3 % (ref 11.5–15.5)
WBC: 4.6 10*3/uL (ref 4.0–10.5)

## 2012-12-06 LAB — COMPREHENSIVE METABOLIC PANEL
AST: 16 U/L (ref 0–37)
CO2: 27 mEq/L (ref 19–32)
Calcium: 10.1 mg/dL (ref 8.4–10.5)
Creatinine, Ser: 1 mg/dL (ref 0.50–1.10)
GFR calc Af Amer: 79 mL/min — ABNORMAL LOW (ref >90)
GFR calc non Af Amer: 68 mL/min — ABNORMAL LOW (ref >90)
Glucose, Bld: 86 mg/dL (ref 70–99)
Sodium: 137 mEq/L (ref 135–145)
Total Protein: 7.7 g/dL (ref 6.0–8.3)

## 2012-12-06 LAB — PROTIME-INR: INR: 1.63 — ABNORMAL HIGH (ref 0.00–1.49)

## 2012-12-06 MED ORDER — CHLORHEXIDINE GLUCONATE 4 % EX LIQD: 1.0000 "application " | Freq: Once | CUTANEOUS | Status: DC

## 2012-12-06 NOTE — Progress Notes (Signed)
During pre-op interview I asked pt about her instructions from Dr. Lovell Sheehan about stopping her Warfarin. Pt said Dr. Lovell Sheehan instructed her to stop taking it on 12/08/12 for her surgery on 12/13/2012.

## 2012-12-07 NOTE — Pre-Procedure Instructions (Signed)
PT/INR 18.8 and  1.63 called to Dr Lovell Sheehan. No further orders given. Dr Jayme Cloud aware with no further orders given.

## 2012-12-13 ENCOUNTER — Encounter (HOSPITAL_COMMUNITY): Payer: Self-pay | Admitting: *Deleted

## 2012-12-13 ENCOUNTER — Observation Stay (HOSPITAL_COMMUNITY)
Admission: RE | Admit: 2012-12-13 | Discharge: 2012-12-14 | Disposition: A | Discharge status: Home / Self Care | Payer: 59 | Source: Ambulatory Visit | Attending: General Surgery | Admitting: General Surgery

## 2012-12-13 ENCOUNTER — Encounter (HOSPITAL_COMMUNITY)
Admission: RE | Disposition: A | Discharge status: Home / Self Care | Payer: Self-pay | Source: Ambulatory Visit | Attending: General Surgery

## 2012-12-13 ENCOUNTER — Encounter (HOSPITAL_COMMUNITY): Payer: Self-pay | Admitting: Anesthesiology

## 2012-12-13 ENCOUNTER — Ambulatory Visit (HOSPITAL_COMMUNITY): Payer: 59 | Admitting: Anesthesiology

## 2012-12-13 DIAGNOSIS — D34 Benign neoplasm of thyroid gland: Principal | ICD-10-CM | POA: Insufficient documentation

## 2012-12-13 DIAGNOSIS — Z01812 Encounter for preprocedural laboratory examination: Secondary | ICD-10-CM | POA: Insufficient documentation

## 2012-12-13 DIAGNOSIS — I10 Essential (primary) hypertension: Secondary | ICD-10-CM | POA: Insufficient documentation

## 2012-12-13 HISTORY — PX: THYROIDECTOMY: SHX17

## 2012-12-13 LAB — COMPREHENSIVE METABOLIC PANEL
BUN: 15 mg/dL (ref 6–23)
Calcium: 9.6 mg/dL (ref 8.4–10.5)
Creatinine, Ser: 1.08 mg/dL (ref 0.50–1.10)
GFR calc Af Amer: 72 mL/min — ABNORMAL LOW (ref >90)
Glucose, Bld: 140 mg/dL — ABNORMAL HIGH (ref 70–99)
Sodium: 134 mEq/L — ABNORMAL LOW (ref 135–145)
Total Protein: 7.6 g/dL (ref 6.0–8.3)

## 2012-12-13 SURGERY — THYROIDECTOMY
Anesthesia: General | Site: Neck | Laterality: Bilateral | Wound class: Clean

## 2012-12-13 MED ORDER — ONDANSETRON HCL 4 MG/2ML IJ SOLN: 4.0000 mg | Freq: Four times a day (QID) | INTRAMUSCULAR | Status: DC | PRN

## 2012-12-13 MED ORDER — PROPOFOL 10 MG/ML IV EMUL
INTRAVENOUS | Status: AC
  Filled 2012-12-13: qty 20

## 2012-12-13 MED ORDER — MIDAZOLAM HCL 2 MG/2ML IJ SOLN
INTRAMUSCULAR | Status: AC
  Filled 2012-12-13: qty 2

## 2012-12-13 MED ORDER — FERROUS SULFATE 325 (65 FE) MG PO TABS
325.0000 mg | ORAL_TABLET | Freq: Every day | ORAL | Status: DC
  Administered 2012-12-13 – 2012-12-14 (×2): 325 mg via ORAL
  Filled 2012-12-13 (×2): qty 1

## 2012-12-13 MED ORDER — FENTANYL CITRATE 0.05 MG/ML IJ SOLN: 25.0000 ug | INTRAMUSCULAR | Status: DC | PRN

## 2012-12-13 MED ORDER — MIDAZOLAM HCL 2 MG/2ML IJ SOLN
1.0000 mg | INTRAMUSCULAR | Status: DC | PRN
  Administered 2012-12-13: 2 mg via INTRAVENOUS

## 2012-12-13 MED ORDER — DEXAMETHASONE SODIUM PHOSPHATE 4 MG/ML IJ SOLN
4.0000 mg | Freq: Once | INTRAMUSCULAR | Status: AC
  Administered 2012-12-13: 4 mg via INTRAVENOUS

## 2012-12-13 MED ORDER — PROPRANOLOL HCL 20 MG PO TABS
20.0000 mg | ORAL_TABLET | Freq: Two times a day (BID) | ORAL | Status: DC
  Administered 2012-12-13 – 2012-12-14 (×3): 20 mg via ORAL
  Filled 2012-12-13 (×3): qty 1

## 2012-12-13 MED ORDER — ROCURONIUM BROMIDE 100 MG/10ML IV SOLN
INTRAVENOUS | Status: DC | PRN
  Administered 2012-12-13 (×2): 10 mg via INTRAVENOUS
  Administered 2012-12-13: 50 mg via INTRAVENOUS

## 2012-12-13 MED ORDER — BUPIVACAINE HCL (PF) 0.5 % IJ SOLN
INTRAMUSCULAR | Status: DC | PRN
  Administered 2012-12-13: 5 mL

## 2012-12-13 MED ORDER — ENOXAPARIN SODIUM 40 MG/0.4ML ~~LOC~~ SOLN
SUBCUTANEOUS | Status: AC
  Filled 2012-12-13: qty 0.4

## 2012-12-13 MED ORDER — ONDANSETRON HCL 4 MG/2ML IJ SOLN
INTRAMUSCULAR | Status: AC
  Filled 2012-12-13: qty 2

## 2012-12-13 MED ORDER — LABETALOL HCL 200 MG PO TABS
200.0000 mg | ORAL_TABLET | Freq: Two times a day (BID) | ORAL | Status: DC
  Administered 2012-12-13 – 2012-12-14 (×3): 200 mg via ORAL
  Filled 2012-12-13 (×3): qty 1

## 2012-12-13 MED ORDER — ROCURONIUM BROMIDE 50 MG/5ML IV SOLN
INTRAVENOUS | Status: AC
  Filled 2012-12-13: qty 1

## 2012-12-13 MED ORDER — HYDROMORPHONE HCL PF 1 MG/ML IJ SOLN
1.0000 mg | INTRAMUSCULAR | Status: DC | PRN
  Administered 2012-12-13 – 2012-12-14 (×3): 1 mg via INTRAVENOUS
  Filled 2012-12-13 (×3): qty 1

## 2012-12-13 MED ORDER — GLYCOPYRROLATE 0.2 MG/ML IJ SOLN
INTRAMUSCULAR | Status: DC | PRN
  Administered 2012-12-13: 0.4 mg via INTRAVENOUS

## 2012-12-13 MED ORDER — ACETAMINOPHEN 10 MG/ML IV SOLN
1000.0000 mg | Freq: Four times a day (QID) | INTRAVENOUS | Status: AC
  Administered 2012-12-13 – 2012-12-14 (×4): 1000 mg via INTRAVENOUS
  Filled 2012-12-13 (×3): qty 100

## 2012-12-13 MED ORDER — NEOSTIGMINE METHYLSULFATE 1 MG/ML IJ SOLN
INTRAMUSCULAR | Status: AC
  Filled 2012-12-13: qty 1

## 2012-12-13 MED ORDER — GLYCOPYRROLATE 0.2 MG/ML IJ SOLN
INTRAMUSCULAR | Status: AC
  Filled 2012-12-13: qty 2

## 2012-12-13 MED ORDER — PROPOFOL 10 MG/ML IV BOLUS
INTRAVENOUS | Status: DC | PRN
  Administered 2012-12-13: 160 mg via INTRAVENOUS

## 2012-12-13 MED ORDER — ONDANSETRON HCL 4 MG/2ML IJ SOLN
4.0000 mg | Freq: Once | INTRAMUSCULAR | Status: AC
  Administered 2012-12-13: 4 mg via INTRAVENOUS

## 2012-12-13 MED ORDER — BUPIVACAINE HCL (PF) 0.5 % IJ SOLN
INTRAMUSCULAR | Status: AC
  Filled 2012-12-13: qty 30

## 2012-12-13 MED ORDER — HEMOSTATIC AGENTS (NO CHARGE) OPTIME
TOPICAL | Status: DC | PRN
  Administered 2012-12-13: 1 via TOPICAL

## 2012-12-13 MED ORDER — ZOLPIDEM TARTRATE 5 MG PO TABS: 5.0000 mg | ORAL_TABLET | Freq: Every evening | ORAL | Status: DC | PRN

## 2012-12-13 MED ORDER — SODIUM CHLORIDE 0.9 % IR SOLN
Status: DC | PRN
  Administered 2012-12-13: 1000 mL

## 2012-12-13 MED ORDER — LACTATED RINGERS IV SOLN
INTRAVENOUS | Status: DC
  Administered 2012-12-13: 13:00:00 via INTRAVENOUS

## 2012-12-13 MED ORDER — LACTATED RINGERS IV SOLN
INTRAVENOUS | Status: DC
  Administered 2012-12-13: 10:00:00 via INTRAVENOUS
  Administered 2012-12-13: 1000 mL via INTRAVENOUS

## 2012-12-13 MED ORDER — FENTANYL CITRATE 0.05 MG/ML IJ SOLN
INTRAMUSCULAR | Status: DC | PRN
  Administered 2012-12-13 (×2): 50 ug via INTRAVENOUS
  Administered 2012-12-13: 150 ug via INTRAVENOUS
  Administered 2012-12-13 (×3): 50 ug via INTRAVENOUS
  Administered 2012-12-13: 100 ug via INTRAVENOUS

## 2012-12-13 MED ORDER — LIDOCAINE HCL (PF) 1 % IJ SOLN
INTRAMUSCULAR | Status: AC
  Filled 2012-12-13: qty 5

## 2012-12-13 MED ORDER — METOPROLOL TARTRATE 1 MG/ML IV SOLN
INTRAVENOUS | Status: AC
  Filled 2012-12-13: qty 5

## 2012-12-13 MED ORDER — METOPROLOL TARTRATE 1 MG/ML IV SOLN
INTRAVENOUS | Status: DC | PRN
  Administered 2012-12-13: 2 mg via INTRAVENOUS
  Administered 2012-12-13 (×3): 1 mg via INTRAVENOUS

## 2012-12-13 MED ORDER — FENTANYL CITRATE 0.05 MG/ML IJ SOLN
INTRAMUSCULAR | Status: AC
  Filled 2012-12-13: qty 5

## 2012-12-13 MED ORDER — INFLUENZA VIRUS VACC SPLIT PF IM SUSP
0.5000 mL | INTRAMUSCULAR | Status: AC
  Administered 2012-12-14: 0.5 mL via INTRAMUSCULAR
  Filled 2012-12-13: qty 0.5

## 2012-12-13 MED ORDER — NEOSTIGMINE METHYLSULFATE 1 MG/ML IJ SOLN
INTRAMUSCULAR | Status: DC | PRN
  Administered 2012-12-13: 3 mg via INTRAVENOUS

## 2012-12-13 MED ORDER — LIDOCAINE HCL 1 % IJ SOLN
INTRAMUSCULAR | Status: DC | PRN
  Administered 2012-12-13: 50 mg via INTRADERMAL

## 2012-12-13 MED ORDER — MIDAZOLAM HCL 5 MG/5ML IJ SOLN
INTRAMUSCULAR | Status: DC | PRN
  Administered 2012-12-13: 2 mg via INTRAVENOUS

## 2012-12-13 MED ORDER — ONDANSETRON HCL 4 MG PO TABS: 4.0000 mg | ORAL_TABLET | Freq: Four times a day (QID) | ORAL | Status: DC | PRN

## 2012-12-13 MED ORDER — MENTHOL 3 MG MT LOZG
1.0000 | LOZENGE | OROMUCOSAL | Status: DC | PRN
  Administered 2012-12-13 (×2): 3 mg via ORAL
  Filled 2012-12-13: qty 9

## 2012-12-13 MED ORDER — ACETAMINOPHEN 10 MG/ML IV SOLN
INTRAVENOUS | Status: AC
  Filled 2012-12-13: qty 100

## 2012-12-13 MED ORDER — DEXAMETHASONE SODIUM PHOSPHATE 4 MG/ML IJ SOLN
INTRAMUSCULAR | Status: AC
  Filled 2012-12-13: qty 1

## 2012-12-13 MED ORDER — ONDANSETRON HCL 4 MG/2ML IJ SOLN: 4.0000 mg | Freq: Once | INTRAMUSCULAR | Status: DC | PRN

## 2012-12-13 MED ORDER — ENOXAPARIN SODIUM 40 MG/0.4ML ~~LOC~~ SOLN
40.0000 mg | Freq: Once | SUBCUTANEOUS | Status: AC
  Administered 2012-12-13: 40 mg via SUBCUTANEOUS

## 2012-12-13 SURGICAL SUPPLY — 57 items
APPLIER CLIP 11 MED OPEN (CLIP) ×2
APPLIER CLIP 9.375 SM OPEN (CLIP) ×14
ATTRACTOMAT 16X20 MAGNETIC DRP (DRAPES) ×2 IMPLANT
BAG HAMPER (MISCELLANEOUS) ×2 IMPLANT
BLADE SURG 15 STRL LF DISP TIS (BLADE) ×3 IMPLANT
BLADE SURG 15 STRL SS (BLADE) ×3
BLADE SURG SZ10 CARB STEEL (BLADE) ×2 IMPLANT
CLIP APPLIE 11 MED OPEN (CLIP) ×1 IMPLANT
CLIP APPLIE 9.375 SM OPEN (CLIP) ×7 IMPLANT
CLOTH BEACON ORANGE TIMEOUT ST (SAFETY) ×2 IMPLANT
COVER LIGHT HANDLE STERIS (MISCELLANEOUS) ×4 IMPLANT
DERMABOND ADVANCED (GAUZE/BANDAGES/DRESSINGS) ×1
DERMABOND ADVANCED .7 DNX12 (GAUZE/BANDAGES/DRESSINGS) ×1 IMPLANT
DRAPE PED LAPAROTOMY (DRAPES) ×2 IMPLANT
DRAPE PROXIMA HALF (DRAPES) ×2 IMPLANT
DURAPREP 6ML APPLICATOR 50/CS (WOUND CARE) ×2 IMPLANT
ELECT NEEDLE TIP 2.8 STRL (NEEDLE) ×2 IMPLANT
ELECT REM PT RETURN 9FT ADLT (ELECTROSURGICAL) ×2
ELECTRODE REM PT RTRN 9FT ADLT (ELECTROSURGICAL) ×1 IMPLANT
FORMALIN 10 PREFIL 120ML (MISCELLANEOUS) ×4 IMPLANT
GAUZE SPONGE 4X4 16PLY XRAY LF (GAUZE/BANDAGES/DRESSINGS) ×6 IMPLANT
GLOVE BIO SURGEON STRL SZ7.5 (GLOVE) ×2 IMPLANT
GLOVE BIOGEL PI IND STRL 7.5 (GLOVE) ×2 IMPLANT
GLOVE BIOGEL PI INDICATOR 7.5 (GLOVE) ×2
GLOVE ECLIPSE 7.0 STRL STRAW (GLOVE) ×4 IMPLANT
GOWN STRL REIN XL XLG (GOWN DISPOSABLE) ×8 IMPLANT
HEMOSTAT SURGICEL 2X3 (HEMOSTASIS) IMPLANT
HEMOSTAT SURGICEL 4X8 (HEMOSTASIS) ×2 IMPLANT
KIT BLADEGUARD II DBL (SET/KITS/TRAYS/PACK) ×2 IMPLANT
KIT ROOM TURNOVER APOR (KITS) ×2 IMPLANT
MANIFOLD NEPTUNE II (INSTRUMENTS) ×2 IMPLANT
MARKER SKIN DUAL TIP RULER LAB (MISCELLANEOUS) ×2 IMPLANT
NEEDLE HYPO 25X1 1.5 SAFETY (NEEDLE) ×2 IMPLANT
NS IRRIG 1000ML POUR BTL (IV SOLUTION) ×2 IMPLANT
PACK BASIC III (CUSTOM PROCEDURE TRAY) ×1
PACK SRG BSC III STRL LF ECLPS (CUSTOM PROCEDURE TRAY) ×1 IMPLANT
PAD ARMBOARD 7.5X6 YLW CONV (MISCELLANEOUS) ×2 IMPLANT
PENCIL HANDSWITCHING (ELECTRODE) ×2 IMPLANT
SET BASIN LINEN APH (SET/KITS/TRAYS/PACK) ×2 IMPLANT
SHEARS HARMONIC 9CM CVD (BLADE) IMPLANT
SPONGE INTESTINAL PEANUT (DISPOSABLE) ×26 IMPLANT
STAPLER VISISTAT (STAPLE) IMPLANT
SUT ETHILON 3 0 FSL (SUTURE) IMPLANT
SUT ETHILON 4 0 PS 2 18 (SUTURE) ×2 IMPLANT
SUT SILK 2 0 (SUTURE)
SUT SILK 2 0 SH (SUTURE) ×2 IMPLANT
SUT SILK 2-0 18XBRD TIE 12 (SUTURE) IMPLANT
SUT SILK 3 0 (SUTURE) ×1
SUT SILK 3-0 18XBRD TIE 12 (SUTURE) ×1 IMPLANT
SUT VIC AB 2-0 CT2 27 (SUTURE) ×2 IMPLANT
SUT VIC AB 3-0 SH 27 (SUTURE) ×1
SUT VIC AB 3-0 SH 27X BRD (SUTURE) ×1 IMPLANT
SUT VIC AB 4-0 PS2 27 (SUTURE) ×2 IMPLANT
SYR CONTROL 10ML LL (SYRINGE) IMPLANT
SYSTEM CHEST DRAIN TLS 7FR (DRAIN) ×2 IMPLANT
TOWEL OR 17X26 4PK STRL BLUE (TOWEL DISPOSABLE) IMPLANT
YANKAUER SUCT BULB TIP 10FT TU (MISCELLANEOUS) ×2 IMPLANT

## 2012-12-13 NOTE — Anesthesia Preprocedure Evaluation (Signed)
Anesthesia Evaluation  Patient identified by MRN, date of birth, ID band Patient awake    Reviewed: Allergy & Precautions, H&P , NPO status , Patient's Chart, lab work & pertinent test results, reviewed documented beta blocker date and time   Airway Mallampati: II TM Distance: >3 FB     Dental  (+) Teeth Intact and Poor Dentition   Pulmonary neg pulmonary ROS,    Pulmonary exam normal       Cardiovascular hypertension, Pt. on medications Rhythm:Regular Rate:Normal     Neuro/Psych    GI/Hepatic   Endo/Other  Hyperthyroidism (beta blockers)   Renal/GU Renal disease     Musculoskeletal   Abdominal   Peds  Hematology   Anesthesia Other Findings   Reproductive/Obstetrics                           Anesthesia Physical Anesthesia Plan  ASA: III  Anesthesia Plan: General   Post-op Pain Management:    Induction: Intravenous  Airway Management Planned: Oral ETT  Additional Equipment:   Intra-op Plan:   Post-operative Plan: Extubation in OR  Informed Consent: I have reviewed the patients History and Physical, chart, labs and discussed the procedure including the risks, benefits and alternatives for the proposed anesthesia with the patient or authorized representative who has indicated his/her understanding and acceptance.     Plan Discussed with:   Anesthesia Plan Comments:         Anesthesia Quick Evaluation

## 2012-12-13 NOTE — Interval H&P Note (Signed)
History and Physical Interval Note:  12/13/2012 7:30 AM  [name]  has presented today for surgery, with the diagnosis of Thyroid Neoplasm  The various methods of treatment have been discussed with the patient and family. After consideration of risks, benefits and other options for treatment, the patient has consented to  Procedure(s) (LRB) with comments: THYROIDECTOMY (N/A) as a surgical intervention .  The patient's history has been reviewed, patient examined, no change in status, stable for surgery.  I have reviewed the patient's chart and labs.  Questions were answered to the patient's satisfaction.     [name]

## 2012-12-13 NOTE — Anesthesia Postprocedure Evaluation (Signed)
  Anesthesia Post-op Note  Patient: [name]  Procedure(s) Performed: Procedure(s) (LRB) with comments: THYROIDECTOMY (Bilateral)  Patient Location: PACU  Anesthesia Type:General  Level of Consciousness: awake, alert , oriented and patient cooperative  Airway and Oxygen Therapy: Patient Spontanous Breathing  Post-op Pain: mild  Post-op Assessment: Post-op Vital signs reviewed, Patient's Cardiovascular Status Stable, Respiratory Function Stable, Patent Airway, No signs of Nausea or vomiting and Pain level controlled  Post-op Vital Signs: Reviewed and stable  Complications: No apparent anesthesia complications

## 2012-12-13 NOTE — Anesthesia Procedure Notes (Signed)
Procedure Name: Intubation Date/Time: 12/13/2012 7:44 AM Performed by: Despina Hidden Pre-anesthesia Checklist: Emergency Drugs available, Suction available, Patient identified and Patient being monitored Patient Re-evaluated:Patient Re-evaluated prior to inductionOxygen Delivery Method: Circle system utilized Preoxygenation: Pre-oxygenation with 100% oxygen Intubation Type: IV induction Ventilation: Mask ventilation without difficulty Laryngoscope Size: Mac and 3 Grade View: Grade I Tube type: Oral Tube size: 7.0 mm Number of attempts: 1 Airway Equipment and Method: Stylet and Patient positioned with wedge pillow Placement Confirmation: ETT inserted through vocal cords under direct vision,  positive ETCO2 and breath sounds checked- equal and bilateral Secured at: 24 cm Tube secured with: Tape Dental Injury: Teeth and Oropharynx as per pre-operative assessment  Comments: No tracheal deviation noted

## 2012-12-13 NOTE — Op Note (Signed)
Patient:  [name]  DOB:  1971-04-05  MRN:  [id number]   Preop Diagnosis:  Hurthle cell tumor  Postop Diagnosis:  Same  Procedure:  Total thyroidectomy  Surgeon:  Franky Macho, M.D.  Assistant: Tilford Pillar, M.D.  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 She now presents for a total thyroidectomy. The risks and benefits of the procedure including bleeding, infection, and nerve injury were fully explained to the patient, gave informed consent.  Procedure note:  The patient was placed in the supine position. After induction of general endotracheal anesthesia, the neck was prepped and draped using usual sterile technique with DuraPrep. Surgical site confirmation was performed.  A transverse incision was made just above the jugular notch. The platysma was divided using Bovie electrocautery. The strap muscles were then divided longitudinally. The strap muscles were then bluntly freed away from both the left and right thyroid lobes. The left lobe was first dissected free. The inferior thyroidal artery and vein as well as middle thyroidal vein and the suspensory ligament of Allyson Sabal were all identified, ligated and divided using small clips. Care was taken to avoid the recurrent laryngeal nerve. Significant adhesions of the gland was noted over the trachea. The right lobe was also noted be enlarged. Again, the inferior thyroidal artery and vein as well as middle thyroidal vein, and suspensory ligament of Berry were divided and ligated using clips. It was a very vascular gland. A suture was placed in the left lobe for orientation purposes. The specimen was then sent to pathology further examination. Surgicel was placed in the thyroid bed. No lymphadenopathy was noted in the central area of the neck. Again, both recurrent laryngeal nerves were identified and noted to be  intact.  A small drain was placed in both thyroidal bed and brought through separate stab wound inferior to the incision line. The strap muscles were reapproximated using a 2-0 Vicryl running suture. The platysma was reapproximated using 3-0 Vicryl running suture. The skin was closed using a 4-0 Vicryl subcuticular suture. 0.5% Sensorcaine was instilled the surrounding wound. Dermabond was then applied.  All tape and needle counts were correct at the end of the procedure. The patient was extubated in the operating room and transferred to PACU in stable condition. The patient was able to phonate th letter E.  Complications:  None  EBL:  350 cc  Specimen:  Thyroid gland, suture left lobe

## 2012-12-13 NOTE — Transfer of Care (Signed)
Immediate Anesthesia Transfer of Care Note  Patient: [name]  Procedure(s) Performed: Procedure(s) (LRB) with comments: THYROIDECTOMY (Bilateral)  Patient Location: PACU  Anesthesia Type:General  Level of Consciousness: awake and patient cooperative  Airway & Oxygen Therapy: Patient Spontanous Breathing and Patient connected to face mask oxygen  Post-op Assessment: Report given to PACU RN, Post -op Vital signs reviewed and stable and Patient moving all extremities  Post vital signs: Reviewed and stable  Complications: No apparent anesthesia complications

## 2012-12-14 LAB — CBC
HCT: 35.4 % — ABNORMAL LOW (ref 36.0–46.0)
Hemoglobin: 12 g/dL (ref 12.0–15.0)
MCHC: 33.9 g/dL (ref 30.0–36.0)
MCV: 84.7 fL (ref 78.0–100.0)
RDW: 13.8 % (ref 11.5–15.5)
WBC: 6 10*3/uL (ref 4.0–10.5)

## 2012-12-14 LAB — COMPREHENSIVE METABOLIC PANEL
ALT: 16 U/L (ref 0–35)
AST: 12 U/L (ref 0–37)
Albumin: 3.1 g/dL — ABNORMAL LOW (ref 3.5–5.2)
CO2: 27 mEq/L (ref 19–32)
Chloride: 102 mEq/L (ref 96–112)
GFR calc non Af Amer: 79 mL/min — ABNORMAL LOW (ref >90)
Potassium: 3.3 mEq/L — ABNORMAL LOW (ref 3.5–5.1)
Sodium: 135 mEq/L (ref 135–145)
Total Bilirubin: 0.4 mg/dL (ref 0.3–1.2)

## 2012-12-14 MED ORDER — LEVOTHYROXINE SODIUM 125 MCG PO TABS: 125.0000 ug | ORAL_TABLET | Freq: Every day | ORAL | Status: DC

## 2012-12-14 MED ORDER — HYDROCODONE-ACETAMINOPHEN 5-325 MG PO TABS: 1.0000 | ORAL_TABLET | ORAL | Status: DC | PRN

## 2012-12-14 MED ORDER — CALCIUM CARBONATE-VITAMIN D 500-200 MG-UNIT PO TABS: 1.0000 | ORAL_TABLET | Freq: Two times a day (BID) | ORAL | Status: DC

## 2012-12-14 NOTE — Progress Notes (Signed)
Pt and pt's spouse provided with discharge instructions and two prescriptions.  Pt verbalized understanding of d/c instructions with teach back.  Pt's IV removed.  Pt tolerated well.

## 2012-12-14 NOTE — Progress Notes (Signed)
Pt BP 159/100 at recheck.  Pt asymptomatic.  States she feels fine.  Dr. Lovell Sheehan notified.  Dr. Lovell Sheehan reviewed medications and stated it would be elevated for a while, since her fluids were just stopped.  Stated he will recheck at follow-up appointment.

## 2012-12-14 NOTE — Progress Notes (Signed)
UR Chart Review Completed  

## 2012-12-14 NOTE — Discharge Instructions (Signed)
Thyroidectomy  Care After  Refer to this sheet in the next few weeks. These instructions provide you with general information on caring for yourself after you leave the hospital. Your caregiver also may give you specific instructions. Your treatment has been planned according to the most current medical practices available, but problems sometimes occur. Call your caregiver if you have any problems or questions after your procedure.  HOME CARE INSTRUCTIONS     It is normal to be sore for a few weeks following surgery. See your caregiver if your pain seems to be getting worse rather than better.   Only take over-the-counter or prescription medicines for pain, discomfort, or fever as directed by your caregiver. Avoid taking medicines that contain aspirin and ibuprofen because they increase the risk of bleeding.   Shower rather than bathe until instructed otherwise by your caregiver.   Change your bandages (dressings) as directed by your caregiver.   You may resume a normal diet and activities as directed by your caregiver.   Avoid lifting weight greater than 20 lb (9 kg) or participating in heavy exercise or contact sports for 10 days or as instructed by your caregiver.   Make an appointment to see your caregiver for stitch (suture) or staple removal.  SEEK MEDICAL CARE IF:     You have increased bleeding from your wound.   You have redness, swelling, or increasing pain from your wound or in your neck.   There is pus coming from your wound.   You have an oral temperature above 102 F (38.9 C).   There is a bad smell coming from the wound or dressing.   You develop lightheadedness or feel faint.   You develop numbness, tingling, or muscle spasms in your arms, hands, feet, or face.   You have difficulty swallowing.  SEEK IMMEDIATE MEDICAL CARE IF:     You develop a rash.   You have difficulty breathing.   You hear whistling noises that come from your chest.    You develop a cough that becomes increasingly worse.   You develop any reaction or side effects to medicines given.   There is swelling in your neck.   You develop changes in speech or hoarseness, which is getting worse.  MAKE SURE YOU:     Understand these instructions.   Will watch your condition.   Will get help right away if you are not doing well or get worse.  Document Released: 07/04/2005 Document Revised: 03/08/2012 Document Reviewed: 02/21/2011  [employer] Patient Information 2013 [employer].

## 2012-12-14 NOTE — Addendum Note (Signed)
Addendum  created 12/14/12 0734 by Marolyn Hammock, CRNA   Modules edited:Notes Section

## 2012-12-14 NOTE — Progress Notes (Signed)
Pt's blood pressure at recheck  153/101.  Pt asymptomatic.  States that she feels fine.  Dr. Lovell Sheehan paged.

## 2012-12-14 NOTE — Anesthesia Postprocedure Evaluation (Signed)
  Anesthesia Post-op Note  Patient: [name]  Procedure(s) Performed: Procedure(s) (LRB) with comments: THYROIDECTOMY (Bilateral)  Patient Location: Room 318  Anesthesia Type: General  Level of Consciousness: awake, alert , oriented and patient cooperative  Airway and Oxygen Therapy: Patient Spontanous Breathing room air  Post-op Pain: mild  Post-op Assessment: Post-op Vital signs reviewed, Patient's Cardiovascular Status Stable, Respiratory Function Stable, Patent Airway and No signs of Nausea or vomiting  Post-op Vital Signs: Reviewed and stable  Complications: No apparent anesthesia complications

## 2012-12-14 NOTE — Progress Notes (Signed)
Pt transported by NT via w/c to main entrance for discharge.  Pt's husband to provided transportation home.

## 2012-12-14 NOTE — Progress Notes (Signed)
Dr. Lovell Sheehan returned page and notified of pt's BP.  Pt asymptomatic.  Dr. Lovell Sheehan stated that she should follow-up with her primary care physician.  Pt instructed to notify her PCP, Dr. Jeanice Lim, today regarding her elevated blood pressure.  Pt verbalized understanding of instructions.

## 2012-12-14 NOTE — Discharge Summary (Signed)
Physician Discharge Summary  Patient ID: [name] MRN: [id number] DOB/AGE: 01/10/1971 41 y.o.  Admit date: 12/13/2012 Discharge date: 12/14/2012  Admission Diagnoses:Hurthle cell tumor of thyroid  Discharge Diagnoses: same Active Problems:  * No active hospital problems. 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 She underwent a total thyroidectomy on 12/13/2012. She did stop her preoperative Coumadin which was treating a recent history of DVT. She tolerated the procedure well. Her postoperative course has been unremarkable. No apparent nerve injury to the recurrent laryngeal nerves are noted. Calcium level has been normal. The patient is being discharged home in good improving condition. Final pathology is pending.}  Treatments: surgery: Total thyroidectomy on 12/13/2012  Discharge Exam: Blood pressure 130/79, pulse 82, temperature 99.1 F (37.3 C), temperature source Oral, resp. rate 19, height 5\' 9"  (1.753 m), weight 92.987 kg (205 lb), last menstrual period 09/18/2012, SpO2 100.00%. General appearance: alert, cooperative and no distress Neck: supple, symmetrical, trachea midline and Incision healing well. Resp: clear to auscultation bilaterally Cardio: regular rate and rhythm, S1, S2 normal, no murmur, click, rub or gallop  Disposition: 01-Home or Self Care  Discharge Orders    Future Appointments: Provider: Department: Dept Phone: Center:   12/20/2012 9:00 AM Salley Scarlet, MD [facility] [phone] [facility]   12/20/2012 11:30 AM [facility] [phone] None   01/24/2013 9:00 AM [facility] [phone] None   03/09/2013 10:30 AM Ellouise Newer, PA [facility] [phone] None       Medication List     As of 12/14/2012  9:27 AM    TAKE these medications        calcium-vitamin D 500-200 MG-UNIT per tablet   Commonly known as: OSCAL WITH D   Take 1 tablet by mouth 2 (two) times daily.      ferrous sulfate 325 (65 FE) MG tablet   Take 325 mg by mouth daily.      HYDROcodone-acetaminophen 5-325 MG per tablet   Commonly known as: NORCO/VICODIN   Take 1-2 tablets by mouth every 4 (four) hours as needed for pain.      labetalol 200 MG tablet   Commonly known as: NORMODYNE   Take 200 mg by mouth 2 (two) times daily.      propranolol 20 MG tablet   Commonly known as: INDERAL   Take 20 mg by mouth 2 (two) times daily.      warfarin 5 MG tablet   Commonly known as: COUMADIN   Take 5 mg by mouth daily at 6 PM. Taking 7.5 mg on Mondays and Thursdays and 5 mg all other days.      zolpidem 5 MG tablet   Commonly known as: AMBIEN   Take 5 mg by mouth at bedtime as needed. Sleep.           Follow-up Information    Follow up with Dalia Heading, MD. Schedule an appointment as soon as possible for a visit on 12/20/2012.   Contact information:   [address] [place] [address] [phone]          Signed: [name] 12/14/2012, 9:27 AM

## 2012-12-15 ENCOUNTER — Encounter (HOSPITAL_COMMUNITY): Payer: Self-pay | Admitting: General Surgery

## 2012-12-20 ENCOUNTER — Encounter (HOSPITAL_COMMUNITY): Payer: 59

## 2012-12-20 ENCOUNTER — Other Ambulatory Visit (HOSPITAL_COMMUNITY): Payer: Self-pay | Admitting: Oncology

## 2012-12-20 ENCOUNTER — Other Ambulatory Visit: Payer: Self-pay | Admitting: Family Medicine

## 2012-12-20 ENCOUNTER — Encounter: Payer: Self-pay | Admitting: Family Medicine

## 2012-12-20 ENCOUNTER — Ambulatory Visit (INDEPENDENT_AMBULATORY_CARE_PROVIDER_SITE_OTHER): Payer: 59 | Admitting: Family Medicine

## 2012-12-20 VITALS — BP 120/88 | HR 88 | Resp 16 | Wt 212.0 lb

## 2012-12-20 DIAGNOSIS — R12 Heartburn: Secondary | ICD-10-CM

## 2012-12-20 DIAGNOSIS — I10 Essential (primary) hypertension: Secondary | ICD-10-CM

## 2012-12-20 DIAGNOSIS — I82409 Acute embolism and thrombosis of unspecified deep veins of unspecified lower extremity: Secondary | ICD-10-CM

## 2012-12-20 DIAGNOSIS — R760 Raised antibody titer: Secondary | ICD-10-CM

## 2012-12-20 DIAGNOSIS — E039 Hypothyroidism, unspecified: Secondary | ICD-10-CM

## 2012-12-20 DIAGNOSIS — D500 Iron deficiency anemia secondary to blood loss (chronic): Secondary | ICD-10-CM

## 2012-12-20 DIAGNOSIS — R894 Abnormal immunological findings in specimens from other organs, systems and tissues: Secondary | ICD-10-CM

## 2012-12-20 DIAGNOSIS — E042 Nontoxic multinodular goiter: Secondary | ICD-10-CM

## 2012-12-20 DIAGNOSIS — N189 Chronic kidney disease, unspecified: Secondary | ICD-10-CM

## 2012-12-20 LAB — PROTIME-INR
INR: 1.01 (ref 0.00–1.49)
Prothrombin Time: 13.2 seconds (ref 11.6–15.2)

## 2012-12-20 MED ORDER — AMLODIPINE BESYLATE 5 MG PO TABS: 5.0000 mg | ORAL_TABLET | Freq: Every day | ORAL | Status: DC

## 2012-12-20 MED ORDER — RANITIDINE HCL 150 MG PO TABS: 150.0000 mg | ORAL_TABLET | Freq: Every day | ORAL | Status: DC

## 2012-12-20 MED ORDER — WARFARIN SODIUM 5 MG PO TABS: 5.0000 mg | ORAL_TABLET | Freq: Every day | ORAL | Status: DC

## 2012-12-20 NOTE — Progress Notes (Signed)
Labs drawn today for pt 

## 2012-12-20 NOTE — Patient Instructions (Addendum)
Stop the inderal Continue labetalol Start norvasc 5 mg daily for blood pressure  Restart coumadin  Zantac at bedtime for heartburn  F/U 4 weeks

## 2012-12-21 ENCOUNTER — Other Ambulatory Visit (HOSPITAL_COMMUNITY): Payer: 59

## 2012-12-24 ENCOUNTER — Encounter: Payer: Self-pay | Admitting: Family Medicine

## 2012-12-24 DIAGNOSIS — E039 Hypothyroidism, unspecified: Secondary | ICD-10-CM | POA: Insufficient documentation

## 2012-12-24 DIAGNOSIS — I10 Essential (primary) hypertension: Secondary | ICD-10-CM | POA: Insufficient documentation

## 2012-12-24 DIAGNOSIS — R12 Heartburn: Secondary | ICD-10-CM | POA: Insufficient documentation

## 2012-12-24 NOTE — Assessment & Plan Note (Signed)
She is now s/p thyroid gland removal on synthroid , can d/c inderal

## 2012-12-24 NOTE — Progress Notes (Signed)
  Subjective:    Patient ID: [name], female    DOB: 1971/01/21, 41 y.o.   MRN: [id number]  HPI Pt here to f/u chronic medical problems, she is doing well, has no particular concerns, s/p hysterecomty had follow up with GYN. S/P thyroid removal for hurthle cell tumor, has f/u with surgery next week, she has noticed increase in heartburn and reflux feeling in back of throat, has not tried any medications for this Meds reviewed, noted she is on 2 beta blockers, labetalol for BP and then inderal for the thyroid before surgery  Planning for upcoming cataract surgery  Review of Systems  GEN- denies fatigue, fever, weight loss,weakness, recent illness HEENT- denies eye drainage, change in vision, nasal discharge, CVS- denies chest pain, palpitations RESP- denies SOB, cough, wheeze ABD- denies N/V, change in stools, abd pain GU- denies dysuria, hematuria, dribbling, incontinence MSK- denies joint pain, muscle aches, injury Neuro- denies headache, dizziness, syncope, seizure activity      Objective:   Physical Exam GEN- NAD, alert and oriented x3 HEENT- PERRL, EOMI, non injected sclera, pink conjunctiva, MMM, oropharynx clear Neck- Supple, incision d/ci/i CVS- RRR, no murmur RESP-CTAB ABD-NABS,soft,NT,ND EXT- No edema Pulses- Radial 2+        Assessment & Plan:

## 2012-12-24 NOTE — Assessment & Plan Note (Signed)
, °

## 2012-12-24 NOTE — Assessment & Plan Note (Signed)
D/c inderal pt is out of medicine, start norvasc 5mg , continue labetolol

## 2012-12-24 NOTE — Assessment & Plan Note (Addendum)
Coumadin therapy per hematology- plan for 3 months, she has been out of coumadin, this was refilled has appt with coumadin clinic this week

## 2012-12-24 NOTE — Assessment & Plan Note (Signed)
Renal function has recovered will continue monitor at regular intervals

## 2012-12-24 NOTE — Assessment & Plan Note (Signed)
Start zantac, she is on coumadin therapy

## 2013-01-17 ENCOUNTER — Ambulatory Visit (INDEPENDENT_AMBULATORY_CARE_PROVIDER_SITE_OTHER): Payer: 59 | Admitting: Family Medicine

## 2013-01-17 ENCOUNTER — Other Ambulatory Visit (HOSPITAL_COMMUNITY)
Admission: RE | Admit: 2013-01-17 | Discharge: 2013-01-17 | Disposition: A | Discharge status: Home / Self Care | Payer: 59 | Source: Ambulatory Visit | Attending: Family Medicine | Admitting: Family Medicine

## 2013-01-17 ENCOUNTER — Encounter: Payer: Self-pay | Admitting: Family Medicine

## 2013-01-17 VITALS — BP 120/86 | HR 73 | Resp 16 | Ht 69.0 in | Wt 221.0 lb

## 2013-01-17 DIAGNOSIS — Z113 Encounter for screening for infections with a predominantly sexual mode of transmission: Secondary | ICD-10-CM | POA: Insufficient documentation

## 2013-01-17 DIAGNOSIS — N760 Acute vaginitis: Secondary | ICD-10-CM | POA: Insufficient documentation

## 2013-01-17 DIAGNOSIS — I10 Essential (primary) hypertension: Secondary | ICD-10-CM

## 2013-01-17 DIAGNOSIS — L259 Unspecified contact dermatitis, unspecified cause: Secondary | ICD-10-CM

## 2013-01-17 MED ORDER — FLUTICASONE PROPIONATE 50 MCG/ACT NA SUSP: 1.0000 | Freq: Two times a day (BID) | NASAL | Status: DC

## 2013-01-17 MED ORDER — FLUCONAZOLE 150 MG PO TABS: 150.0000 mg | ORAL_TABLET | Freq: Once | ORAL | Status: DC

## 2013-01-17 MED ORDER — HYDROCORTISONE 1 % EX CREA: TOPICAL_CREAM | CUTANEOUS | Status: AC

## 2013-01-17 NOTE — Progress Notes (Signed)
  Subjective:    Patient ID: [name], female    DOB: [birth year], 42 y.o.   MRN: [id number]  HPI Vaginal itching for the past 2-3 weeks she is to go to soap and then has significant it itching afterward she's also had some vaginal discharge. She continues to have runny nose which she is noticing she got a hospital but it is worsening. She cannot get it to stop she's not used any over-the-counter medication Tolerating Norvasc which was started 4 weeks ago and Inderal was discontinued  Review of Systems   GEN- denies fatigue, fever, weight loss,weakness, recent illness HEENT- denies eye drainage, change in vision, nasal discharge, CVS- denies chest pain, palpitations RESP- denies SOB, cough, wheeze ABD- denies N/V, change in stools, abd pain GU- denies dysuria, hematuria, dribbling, incontinence MSK- denies joint pain, muscle aches, injury Neuro- denies headache, dizziness, syncope, seizure activity      Objective:   Physical Exam  GEN- NAD, alert and oriented, Neck- supple, no thyromegaly HEENT- PERRL EOMI, clear nasal drainage, orohparynx clear, TM clear bilat, mild sinus pressure on right side CVS-RRR, no murmur RESP-CTAB Breast- normal symmetry, no nipple inversion,no nipple drainage, no nodules or lumps felt Nodes- no axillary nodes GU- normal external genitalia, vaginal mucosa pink and moist, cervix visualized no growth, no blood form os, + moderate white discharge, s/p supracervical hysterectomy Skin- inguinal creases- hyperpigmentated macules with rough texture      Assessment & Plan:

## 2013-01-17 NOTE — Patient Instructions (Signed)
Use the cream on the outside of the vaginal area twice a day for itching Do not douche Take 1 diflucan today and repeat in 3 days, we will call if more meds needed Use the nose spray Blood pressure looks good F/U 4 months

## 2013-01-18 DIAGNOSIS — N760 Acute vaginitis: Secondary | ICD-10-CM | POA: Insufficient documentation

## 2013-01-18 DIAGNOSIS — L259 Unspecified contact dermatitis, unspecified cause: Secondary | ICD-10-CM | POA: Insufficient documentation

## 2013-01-18 NOTE — Assessment & Plan Note (Signed)
Blood pressure well controlled on the Norvasc and labetalol we'll continue

## 2013-01-18 NOTE — Assessment & Plan Note (Signed)
She has a contact dermatitis rash in the inguinal region where she used to so. I will give her hydrocortisone cream to apply twice a day for itching

## 2013-01-18 NOTE — Assessment & Plan Note (Signed)
Cultures taken she was given Diflucan will await for any further medications

## 2013-01-19 MED ORDER — METRONIDAZOLE 500 MG PO TABS: 500.0000 mg | ORAL_TABLET | Freq: Two times a day (BID) | ORAL | Status: DC

## 2013-01-19 NOTE — Addendum Note (Signed)
Addended by: Milinda Antis F on: 01/19/2013 11:04 PM   Modules accepted: Orders

## 2013-01-24 ENCOUNTER — Other Ambulatory Visit (HOSPITAL_COMMUNITY): Payer: 59

## 2013-02-15 ENCOUNTER — Other Ambulatory Visit (HOSPITAL_COMMUNITY): Payer: 59

## 2013-03-01 ENCOUNTER — Other Ambulatory Visit (HOSPITAL_COMMUNITY): Payer: 59

## 2013-03-08 ENCOUNTER — Encounter (HOSPITAL_COMMUNITY): Payer: Self-pay | Admitting: Oncology

## 2013-03-08 DIAGNOSIS — D34 Benign neoplasm of thyroid gland: Secondary | ICD-10-CM

## 2013-03-08 HISTORY — DX: Benign neoplasm of thyroid gland: D34

## 2013-03-08 NOTE — Progress Notes (Signed)
-  no show-  Letter sent

## 2013-03-09 ENCOUNTER — Encounter (HOSPITAL_COMMUNITY): Payer: Self-pay | Admitting: Oncology

## 2013-03-09 ENCOUNTER — Ambulatory Visit (HOSPITAL_COMMUNITY): Payer: 59 | Admitting: Oncology

## 2013-04-26 ENCOUNTER — Encounter (HOSPITAL_COMMUNITY): Payer: Self-pay | Admitting: Oncology

## 2013-04-26 ENCOUNTER — Ambulatory Visit (HOSPITAL_COMMUNITY): Payer: 59 | Admitting: Oncology

## 2013-04-26 DIAGNOSIS — R760 Raised antibody titer: Secondary | ICD-10-CM | POA: Insufficient documentation

## 2013-04-26 HISTORY — DX: Raised antibody titer: R76.0

## 2013-04-26 NOTE — Progress Notes (Signed)
-   No show, second occurrence.  Second no show letter sent-

## 2013-05-17 ENCOUNTER — Ambulatory Visit: Payer: 59 | Admitting: Family Medicine

## 2013-05-25 ENCOUNTER — Encounter (HOSPITAL_COMMUNITY): Payer: Self-pay | Admitting: Oncology

## 2013-06-23 ENCOUNTER — Encounter (HOSPITAL_COMMUNITY): Payer: Self-pay | Admitting: Oncology

## 2013-07-14 ENCOUNTER — Encounter (HOSPITAL_COMMUNITY): Payer: Self-pay | Admitting: Oncology

## 2013-07-14 ENCOUNTER — Encounter (HOSPITAL_COMMUNITY): Payer: 59 | Attending: Oncology | Admitting: Oncology

## 2013-07-14 VITALS — BP 133/89 | HR 89 | Temp 97.9°F | Resp 16 | Wt 276.6 lb

## 2013-07-14 DIAGNOSIS — R760 Raised antibody titer: Secondary | ICD-10-CM

## 2013-07-14 DIAGNOSIS — I82409 Acute embolism and thrombosis of unspecified deep veins of unspecified lower extremity: Secondary | ICD-10-CM | POA: Insufficient documentation

## 2013-07-14 DIAGNOSIS — R894 Abnormal immunological findings in specimens from other organs, systems and tissues: Secondary | ICD-10-CM | POA: Insufficient documentation

## 2013-07-14 LAB — CBC WITH DIFFERENTIAL/PLATELET
Basophils Absolute: 0 10*3/uL (ref 0.0–0.1)
Basophils Relative: 0 % (ref 0–1)
Eosinophils Absolute: 0.1 10*3/uL (ref 0.0–0.7)
Hemoglobin: 13.8 g/dL (ref 12.0–15.0)
MCH: 30 pg (ref 26.0–34.0)
MCHC: 34.3 g/dL (ref 30.0–36.0)
Monocytes Relative: 4 % (ref 3–12)
Neutro Abs: 4.1 10*3/uL (ref 1.7–7.7)
Neutrophils Relative %: 72 % (ref 43–77)
Platelets: 204 10*3/uL (ref 150–400)
RDW: 13.2 % (ref 11.5–15.5)

## 2013-07-14 LAB — D-DIMER, QUANTITATIVE: D-Dimer, Quant: 0.34 ug/mL-FEU (ref 0.00–0.48)

## 2013-07-14 LAB — ANTITHROMBIN III: AntiThromb III Func: 107 % (ref 75–120)

## 2013-07-14 NOTE — Progress Notes (Signed)
Milinda Antis, MD [address] [place] [address]  Anticardiolipin antibody positive - Plan: CBC with Differential, Antithrombin III, Protein C activity, Protein C, total, Protein S activity, Protein S, total, Lupus anticoagulant panel, Beta-2-glycoprotein i abs, IgG/M/A, Homocysteine, serum, Factor 5 leiden, Prothrombin gene mutation, Cardiolipin antibodies, IgG, IgM, IgA  Lupus anticoagulant positive - Plan: CBC with Differential, Antithrombin III, Protein C activity, Protein C, total, Protein S activity, Protein S, total, Lupus anticoagulant panel, Beta-2-glycoprotein i abs, IgG/M/A, Homocysteine, serum, Factor 5 leiden, Prothrombin gene mutation, Cardiolipin antibodies, IgG, IgM, IgA  Acute DVT (deep venous thrombosis), unspecified laterality - Plan: CBC with Differential, Antithrombin III, Protein C activity, Protein C, total, Protein S activity, Protein S, total, Lupus anticoagulant panel, Beta-2-glycoprotein i abs, IgG/M/A, Homocysteine, serum, Factor 5 leiden, Prothrombin gene mutation, Cardiolipin antibodies, IgG, IgM, IgA   CURRENT THERAPY: Off anticoagulation  INTERVAL HISTORY: Sharyon Cable 42 y.o. female returns for  regular  visit for followup of  acute DVT of the left leg, left arm, and right arm in the setting of sepsis, necrotic fibroid tumor of the uterus, abscess of the uterus, and extensive blood loss. Hypercoagulable panel was performed which showed a Lupus anticoagulant with concomitant anticardiolipin IgG antibody. She also had a PICC line in the right arm at the site of DVT as well.  The patient is a nonsmoker.  She reports that she has been compliant with her Coumadin and she completed in March of 2013. I asked her, "who has been monitoring your Coumadin levels." She reported that we were.  When I review her chart in, last INR that we have on file is from 12/20/2012 with 9 or 1.01. Her previous INR as were 1.63 on 12/06/2012, 2.03 on 11/30/2012, 2.79  on 11/16/2012.  On further discussion, the patient thinks that she discontinued her Coumadin sooner than March which she is unable to tell me the exact date.  I suspect she quit taking the Coumadin in the December time frame.  Patient's been very noncompliant with anticoagulation therapy and followup. A release from clinic letter was sent on 06/23/2013. Today's appointment was made by the patient on 07/05/2013. Not exactly sure how the patient was able to make this appointment after being released from the clinic. I suspect that the front office scheduling employees were unaware of this release from clinic.  She denies any signs or symptoms of blood clots since her initial diagnosis in October 2013.  Since she has been truly noncompliant with her anticoagulation, we will repeat a hypercoagulable panel and d-dimer today. If hypercoagulable panel is within normal limits, release the patient from the clinic and asked that she call the primary care physician.  The patient was educated about her hypercoagulable abnormalities in the past and these can be transient in the setting of her acute illness.  We'll see her back in 3 weeks to followup on laboratory results and discuss future options.  Hematologically, the patient denies any complaints and ROS questioning is negative.  Past Medical History  Diagnosis Date  . Thyroid disease   . Fibroid tumor   . DVT (deep venous thrombosis) 09/30/12    in upper extremities b/l and LLE  . Anemia   . Thyroid nodule     s/p biopsy  . Graves disease   . Multinodular goiter 10/01/2012  . Lupus anticoagulant positive 10/02/2012  . Hypertension   . 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 Lovell Sheehan on 12/13/2012.  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     has No Known Allergies.  Ms. Berrett had no medications administered during this visit.  Past Surgical History  Procedure Laterality Date  . Supraclavical abdominal hysterectomy  09/27/2012    Procedure: HYSTERECTOMY SUPRACERVICAL ABDOMINAL;  Surgeon: Tilda Burrow, MD;  Location: [facility];  Service: Gynecology;  Laterality: N/A;  . Hematoma evacuation  10/03/2012    Procedure: EVACUATION HEMATOMA;  Surgeon: Tilda Burrow, MD;  Location: [facility];  Service: Gynecology;  Laterality: N/A;  . Thyroidectomy  12/13/2012    Procedure: THYROIDECTOMY;  Surgeon: Dalia Heading, MD;  Location: [facility];  Service: General;  Laterality: Bilateral;    Denies any headaches, dizziness, double vision, fevers, chills, night sweats, nausea, vomiting, diarrhea, constipation, chest pain, heart palpitations, shortness of breath, blood in stool, black tarry stool, urinary pain, urinary burning, urinary frequency, hematuria.   PHYSICAL EXAMINATION  ECOG PERFORMANCE STATUS: 0 - Asymptomatic  Filed Vitals:   07/14/13 1217  BP: 133/89  Pulse: 89  Temp: 97.9 F (36.6 C)  Resp: 16    GENERAL:alert, no distress, well nourished, well developed, comfortable, cooperative, obese and smiling SKIN: skin color, texture, turgor are normal, no rashes or significant lesions HEAD: Normocephalic, No masses, lesions, tenderness or abnormalities EYES: PERRLA, EOMI, Conjunctiva are pink and non-injected, exophthalmos EARS: External ears normal OROPHARYNX:mucous membranes are moist  NECK: supple, no adenopathy, thyroid normal size, non-tender, without nodularity, no stridor, non-tender, trachea midline LYMPH:  no palpable lymphadenopathy, no hepatosplenomegaly BREAST:not examined LUNGS: clear to auscultation and percussion HEART: regular rate & rhythm, no murmurs, no gallops, S1 normal  and S2 normal ABDOMEN:abdomen soft, non-tender, obese, normal bowel sounds, no masses or organomegaly and no hepatosplenomegaly BACK: Back symmetric, no curvature., No CVA tenderness EXTREMITIES:less then 2 second capillary refill, no joint deformities, effusion, or inflammation, no edema, no skin discoloration, no clubbing, no cyanosis  NEURO: alert & oriented x 3 with fluent speech, no focal motor/sensory deficits, gait normal   LABORATORY DATA: Lab Results  Component Value Date   INR 1.01 12/20/2012   INR 1.63* 12/06/2012   INR 2.03* 11/30/2012      ASSESSMENT: 1. acute DVT of the left leg, left arm, and right arm in the setting of sepsis, necrotic fibroid tumor of the uterus, abscess of the uterus, and extensive blood loss. Hypercoagulable panel was performed which showed a Lupus anticoagulant with concomitant anticardiolipin IgG antibody. She also had a PICC line in the right arm at the site of DVT as well.  Started on anticoagulation with Coumadin x6 months, but patient noncompliant and it is suspected she stopped er anticoagulation in December 2013 after 2 months worth of anticoagulation. 2. Noncompliance.   Patient Active Problem List   Diagnosis Date Noted  . 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 Contact dermatitis 01/18/2013  . Vaginitis 01/18/2013  . Essential hypertension, benign 12/24/2012  . Heartburn 12/24/2012  . Hypothyroidism 12/24/2012  . Lupus anticoagulant positive 10/02/2012  . Acute DVT (deep venous thrombosis) 09/27/2012  . Chronic kidney disease 08/28/2012  . Fibroid uterus 06/12/2012     PLAN:  1. I personally reviewed and went over  laboratory results with the patient. 2. Labs today: CBC diff, Hypercoagulable panel, D-Dimer 3. Return in 3 weeks for follow-up   THERAPY PLAN:  If hypercoagulable abnormalities have resolved, we'll release the patient from the clinic at her next followup appointment.  If she remains anticardiolipin IgG antibody positive plus or minus lupus anticoagulant positive then we will need to discuss future therapeutic options in light of her noncompliance. We'll wait and see what the results show and then discuss options in the future.  All questions were answered. The patient knows to call the clinic with any problems, questions or concerns. We can certainly see the patient much sooner if necessary.  Patient and plan discussed with Dr. Erline Hau and he is in agreement with the aforementioned.   KEFALAS,THOMAS

## 2013-07-14 NOTE — Patient Instructions (Addendum)
[facility] Discharge Instructions  RECOMMENDATIONS MADE BY THE CONSULTANT AND ANY TEST RESULTS WILL BE SENT TO YOUR REFERRING PHYSICIAN.  EXAM FINDINGS BY THE PHYSICIAN TODAY AND SIGNS OR SYMPTOMS TO REPORT TO CLINIC OR PRIMARY PHYSICIAN: Exam and discussion by PA.  We will check some blood work today and will need to see you back in 2 - 3 weeks for follow-up.  MEDICATIONS PRESCRIBED:  none  INSTRUCTIONS GIVEN AND DISCUSSED: See Dr. Fransico Him regarding your thyroid medication and follow-up with Dr. Jeanice Lim for your other health issues.  SPECIAL INSTRUCTIONS/FOLLOW-UP: Follow-up in 2 - 3 weeks.  Thank you for choosing [facility] to provide your oncology and hematology care.  To afford each patient quality time with our providers, please arrive at least 15 minutes before your scheduled appointment time.  With your help, our goal is to use those 15 minutes to complete the necessary work-up to ensure our physicians have the information they need to help with your evaluation and healthcare recommendations.    Effective January 1st, 2014, we ask that you re-schedule your appointment with our physicians should you arrive 10 or more minutes late for your appointment.  We strive to give you quality time with our providers, and arriving late affects you and other patients whose appointments are after yours.    Again, thank you for choosing [facility].  Our hope is that these requests will decrease the amount of time that you wait before being seen by our physicians.       _____________________________________________________________  Should you have questions after your visit to [facility], please contact our office at [phone] between the hours of 8:30 a.m. and 5:00 p.m.  Voicemails left after 4:30 p.m. will not be returned until the following business day.  For prescription refill requests, have your pharmacy contact our office with your  prescription refill request.

## 2013-07-14 NOTE — Progress Notes (Signed)
[name] presented for [employer]. Labs per MD order drawn via Peripheral Line 21 gauge needle inserted in right AC  Good blood return present. Procedure without incident.  Needle removed intact. Patient tolerated procedure well.

## 2013-07-15 LAB — FACTOR 5 LEIDEN

## 2013-07-15 LAB — LUPUS ANTICOAGULANT PANEL
DRVVT: 45 secs — ABNORMAL HIGH (ref <42.9)
Lupus Anticoagulant: NOT DETECTED
dRVVT Incubated 1:1 Mix: 36.4 secs (ref <42.9)

## 2013-07-15 LAB — BETA-2-GLYCOPROTEIN I ABS, IGG/M/A
Beta-2 Glyco I IgG: 0 G Units (ref <20)
Beta-2-Glycoprotein I IgM: 1 M Units (ref <20)

## 2013-07-15 LAB — CARDIOLIPIN ANTIBODIES, IGG, IGM, IGA
Anticardiolipin IgA: 3 APL U/mL — ABNORMAL LOW (ref <22)
Anticardiolipin IgM: 4 MPL U/mL — ABNORMAL LOW (ref <11)

## 2013-07-15 LAB — PROTEIN C ACTIVITY: Protein C Activity: 200 % — ABNORMAL HIGH (ref 75–133)

## 2013-07-15 LAB — PROTEIN C, TOTAL: Protein C, Total: 135 % (ref 72–160)

## 2013-08-04 ENCOUNTER — Encounter (HOSPITAL_COMMUNITY): Payer: 59 | Attending: Oncology | Admitting: Oncology

## 2013-08-04 VITALS — BP 148/108 | HR 84 | Temp 98.5°F | Resp 20 | Wt 278.7 lb

## 2013-08-04 DIAGNOSIS — R894 Abnormal immunological findings in specimens from other organs, systems and tissues: Secondary | ICD-10-CM | POA: Insufficient documentation

## 2013-08-04 DIAGNOSIS — R760 Raised antibody titer: Secondary | ICD-10-CM

## 2013-08-04 DIAGNOSIS — I82409 Acute embolism and thrombosis of unspecified deep veins of unspecified lower extremity: Secondary | ICD-10-CM

## 2013-08-04 DIAGNOSIS — Z86718 Personal history of other venous thrombosis and embolism: Secondary | ICD-10-CM

## 2013-08-04 NOTE — Patient Instructions (Addendum)
[facility] Discharge Instructions  RECOMMENDATIONS MADE BY THE CONSULTANT AND ANY TEST RESULTS WILL BE SENT TO YOUR REFERRING PHYSICIAN.  MEDICATIONS PRESCRIBED:  None  INSTRUCTIONS GIVEN AND DISCUSSED: We reviewed your hypercoagulable panel and it was negative on 07/14/2013. You were provided a copy of your April 2013 and July 2014 hypercoagulable panel. Your lupus anticoagulant and elevation of Anticardiolipin IgG was transiently elevated in the setting of an acute infection in April 2013.  SPECIAL INSTRUCTIONS/FOLLOW-UP: Follow-up with PCP as directed or needed.  Release from the clinic.  Thank you for choosing [facility] to provide your oncology and hematology care.  To afford each patient quality time with our providers, please arrive at least 15 minutes before your scheduled appointment time.  With your help, our goal is to use those 15 minutes to complete the necessary work-up to ensure our physicians have the information they need to help with your evaluation and healthcare recommendations.    Effective January 1st, 2014, we ask that you re-schedule your appointment with our physicians should you arrive 10 or more minutes late for your appointment.  We strive to give you quality time with our providers, and arriving late affects you and other patients whose appointments are after yours.    Again, thank you for choosing [facility].  Our hope is that these requests will decrease the amount of time that you wait before being seen by our physicians.       _____________________________________________________________  Should you have questions after your visit to [facility], please contact our office at [phone] between the hours of 8:30 a.m. and 5:00 p.m.  Voicemails left after 4:30 p.m. will not be returned until the following business day.  For prescription refill requests, have your pharmacy contact our office with  your prescription refill request.

## 2013-08-04 NOTE — Progress Notes (Signed)
Debra Antis, MD [address] [place] [address]  Acute DVT (deep venous thrombosis), unspecified laterality  Anticardiolipin antibody positive   CURRENT THERAPY: Off anticoagulation  INTERVAL HISTORY: [name] 42 y.o. female returns for  regular  visit for followup of  acute DVT of the left leg, left arm, and right arm in the setting of sepsis, necrotic fibroid tumor of the uterus, abscess of the uterus, and extensive blood loss. Hypercoagulable panel was performed which showed a Lupus anticoagulant with concomitant anticardiolipin IgG antibody. She also had a PICC line in the right arm at the site of DVT as well.  The patient is a nonsmoker.  She reports that she has been compliant with her Coumadin and she completed in March of 2013. I asked her, "who has been monitoring your Coumadin levels." She reported that we were.  When I review her chart, last INR that we have on file is from 12/20/2012 with and INR of 1.01. Her previous INRs were 1.63 on 12/06/2012, 2.03 on 11/30/2012, 2.79 on 11/16/2012.  On further discussion, the patient thinks that she discontinued her Coumadin sooner than March which she is unable to tell me the exact date.  I suspect she quit taking her Coumadin in the December 2013 time frame.  Patient's been noncompliant with anticoagulation therapy and followup. A release from clinic letter was sent on 06/23/2013. Today's appointment was made by the patient on 07/05/2013. Not exactly sure how the patient was able to make this appointment after being released from the clinic. I suspect that the front office scheduling employees were unaware of this release from clinic.  She denies any signs or symptoms of blood clots since her initial diagnosis in October 2013.  Since she has been truly noncompliant with her anticoagulation, a repeat a hypercoagulable panel and d-dimer was performed on 07/14/2013.  I personally reviewed and went over laboratory  results with the patient. Her panel was negative with a low anticardiolipin IgA, IgG, and IgM level.  No lupus anticoagulant detected.  Negative antithrombin II gene mutation.  Negative Factor V Leiden.  Her D-Dimer was WNL.   Protein C activity is elevated, but not low, and Protein S is WNL.    Clinically, she does not have any signs or symptoms of DVT or clots.  No indication for PE.  She has not be on anticoagulation since prior to Jan 2014.  With the above mentioned negative panel, we will release her from the clinic and I have asked her to follow-up wit her PCP.  Hematologically, the patient denies any complaints and ROS questioning is negative.  Past Medical History  Diagnosis Date  . Thyroid disease   . Fibroid tumor   . DVT (deep venous thrombosis) 09/30/12    in upper extremities b/l and LLE  . Anemia   . Thyroid nodule     s/p biopsy  . Graves disease   . Multinodular goiter 10/01/2012  . Lupus anticoagulant positive 10/02/2012  . Hypertension   . 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 Lovell Sheehan on 12/13/2012.  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     has No Known Allergies.  [name] does  not currently have medications on file.  Past Surgical History  Procedure Laterality Date  . Supracervical abdominal hysterectomy  09/27/2012    Procedure: HYSTERECTOMY SUPRACERVICAL ABDOMINAL;  Surgeon: Tilda Burrow, MD;  Location: [facility];  Service: Gynecology;  Laterality: N/A;  . Hematoma evacuation  10/03/2012    Procedure: EVACUATION HEMATOMA;  Surgeon: Tilda Burrow, MD;  Location: [facility];  Service: Gynecology;  Laterality: N/A;  .  Thyroidectomy  12/13/2012    Procedure: THYROIDECTOMY;  Surgeon: Dalia Heading, MD;  Location: [facility];  Service: General;  Laterality: Bilateral;    Denies any headaches, dizziness, double vision, fevers, chills, night sweats, nausea, vomiting, diarrhea, constipation, chest pain, heart palpitations, shortness of breath, blood in stool, black tarry stool, urinary pain, urinary burning, urinary frequency, hematuria.   PHYSICAL EXAMINATION  ECOG PERFORMANCE STATUS: 0 - Asymptomatic  There were no vitals filed for this visit.  GENERAL:alert, no distress, well nourished, well developed, comfortable, cooperative, obese and smiling SKIN: skin color, texture, turgor are normal, no rashes or significant lesions HEAD: Normocephalic, No masses, lesions, tenderness or abnormalities EYES: PERRLA, EOMI, Conjunctiva are pink and non-injected, exophthalmos EARS: External ears normal OROPHARYNX:mucous membranes are moist  NECK: supple, no adenopathy, thyroid normal size, non-tender, without nodularity, no stridor, non-tender, trachea midline LYMPH:  no palpable lymphadenopathy, no hepatosplenomegaly BREAST:not examined LUNGS: clear to auscultation and percussion HEART: regular rate & rhythm, no murmurs, no gallops, S1 normal and S2 normal ABDOMEN:abdomen soft, non-tender, obese, normal bowel sounds, no masses or organomegaly and no hepatosplenomegaly BACK: Back symmetric, no curvature., No CVA tenderness EXTREMITIES:less then 2 second capillary refill, no joint deformities, effusion, or inflammation, no edema, no skin discoloration, no clubbing, no cyanosis  NEURO: alert & oriented x 3 with fluent speech, no focal motor/sensory deficits, gait normal   LABORATORY DATA: Lab Results  Component Value Date   INR 1.01 12/20/2012   INR 1.63* 12/06/2012   INR 2.03* 11/30/2012      ASSESSMENT: 1. Acute DVT of the left leg, left arm, and right arm in the setting of sepsis, necrotic fibroid tumor of the  uterus, abscess of the uterus, and extensive blood loss. Hypercoagulable panel was performed which showed a Lupus anticoagulant with concomitant anticardiolipin IgG antibody. She also had a PICC line in the right arm at the site of DVT as well.  Started on anticoagulation with Coumadin x6 months, but patient noncompliant and it is suspected she stopped her anticoagulation in December 2013 after 2 months worth of anticoagulation. Negative follow-up hypercoagulable panel.  Etiology of DVT is from acute infection. 2. Noncompliance.    Patient Active Problem List   Diagnosis Date Noted  . 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 Contact dermatitis 01/18/2013  . Vaginitis 01/18/2013  . Essential hypertension, benign 12/24/2012  . Heartburn 12/24/2012  . Hypothyroidism 12/24/2012  . Lupus anticoagulant positive 10/02/2012  . Acute DVT (deep venous thrombosis) 09/27/2012  . Chronic kidney disease 08/28/2012  . Fibroid uterus 06/12/2012     PLAN:  1. I personally reviewed and went over laboratory results with the patient. 2. Release patient from clinic.  I recommend that she follow-up with her PCP.    THERAPY PLAN:  Release the patient from the clinic.  Her DVT was likely incited by her acute infection last year.  She was noncompliant with anticoagulation.  Her repeat Hypercoag panel was performed on 07/14/2013 and was negative for any abnormalities or explanations for  DVT.  Release from clinic.   All questions were answered. The patient knows to call the clinic with any problems, questions or concerns. We can certainly see the patient much sooner if necessary.  Patient and plan discussed with Dr. Benita Gutter and he is in agreement with the aforementioned.   Baruc Tugwell

## 2013-11-29 ENCOUNTER — Other Ambulatory Visit: Payer: Self-pay | Admitting: Family Medicine

## 2013-11-29 ENCOUNTER — Encounter: Payer: Self-pay | Admitting: Family Medicine

## 2013-11-29 NOTE — Telephone Encounter (Signed)
Medication refill for one time only.  Patient needs to be seen.  Letter sent for patient to call and schedule 

## 2014-01-18 ENCOUNTER — Telehealth: Payer: Self-pay | Admitting: Family Medicine

## 2014-01-18 NOTE — Telephone Encounter (Signed)
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

## 2014-01-18 NOTE — Telephone Encounter (Signed)
Pt calling saying she is needing an order faxed over to [facility] lab in [place] to have a tb skin test done.

## 2014-01-18 NOTE — Telephone Encounter (Signed)
She has not been seen in over a year, she would need to make OV to have TB test done and for exam before  I complete any paperwork

## 2014-01-19 NOTE — Telephone Encounter (Signed)
Pt is aeware needs to be seen adn pt has been set up fri at 200pm

## 2014-01-20 ENCOUNTER — Ambulatory Visit: Payer: Self-pay | Admitting: Family Medicine

## 2014-07-07 ENCOUNTER — Other Ambulatory Visit: Payer: Self-pay | Admitting: Family Medicine

## 2014-07-07 ENCOUNTER — Encounter: Payer: Self-pay | Admitting: *Deleted

## 2014-07-07 ENCOUNTER — Emergency Department (HOSPITAL_COMMUNITY)
Admission: EM | Admit: 2014-07-07 | Discharge: 2014-07-07 | Disposition: A | Discharge status: Home / Self Care | Payer: BC Managed Care – PPO | Attending: Emergency Medicine | Admitting: Emergency Medicine

## 2014-07-07 ENCOUNTER — Encounter (HOSPITAL_COMMUNITY): Payer: Self-pay | Admitting: Emergency Medicine

## 2014-07-07 DIAGNOSIS — D649 Anemia, unspecified: Secondary | ICD-10-CM | POA: Insufficient documentation

## 2014-07-07 DIAGNOSIS — Z711 Person with feared health complaint in whom no diagnosis is made: Secondary | ICD-10-CM

## 2014-07-07 DIAGNOSIS — Z79899 Other long term (current) drug therapy: Secondary | ICD-10-CM | POA: Insufficient documentation

## 2014-07-07 DIAGNOSIS — I10 Essential (primary) hypertension: Secondary | ICD-10-CM | POA: Insufficient documentation

## 2014-07-07 DIAGNOSIS — Z86718 Personal history of other venous thrombosis and embolism: Secondary | ICD-10-CM | POA: Insufficient documentation

## 2014-07-07 DIAGNOSIS — Z7901 Long term (current) use of anticoagulants: Secondary | ICD-10-CM | POA: Insufficient documentation

## 2014-07-07 DIAGNOSIS — Z0389 Encounter for observation for other suspected diseases and conditions ruled out: Secondary | ICD-10-CM | POA: Insufficient documentation

## 2014-07-07 DIAGNOSIS — R000 Tachycardia, unspecified: Secondary | ICD-10-CM | POA: Insufficient documentation

## 2014-07-07 DIAGNOSIS — E079 Disorder of thyroid, unspecified: Secondary | ICD-10-CM | POA: Insufficient documentation

## 2014-07-07 DIAGNOSIS — Z8742 Personal history of other diseases of the female genital tract: Secondary | ICD-10-CM | POA: Insufficient documentation

## 2014-07-07 DIAGNOSIS — IMO0002 Reserved for concepts with insufficient information to code with codable children: Secondary | ICD-10-CM | POA: Insufficient documentation

## 2014-07-07 DIAGNOSIS — F411 Generalized anxiety disorder: Secondary | ICD-10-CM | POA: Insufficient documentation

## 2014-07-07 NOTE — ED Provider Notes (Signed)
CSN: [id number]     Arrival date & time 07/07/14  1041 History   First MD Initiated Contact with Patient 07/07/14 1045     Chief Complaint  Patient presents with  . SEXUALLY TRANSMITTED DISEASE     (Consider location/radiation/quality/duration/timing/severity/associated sxs/prior Treatment) HPI   Tashanda Fuhrer Graw is a 43 y.o. female presenting for evaluation of possible std.  She described to RN in triage that "her husband may have given me something".  When asked if she had any symptoms such as pelvic pain or vaginal discharge,  She stood up, and stated "No,  I think someone is just trying to play with me.  I don't have any symptoms and don't want to be checked."      Past Medical History  Diagnosis Date  . Thyroid disease   . Fibroid tumor   . DVT (deep venous thrombosis) 09/30/12    in upper extremities b/l and LLE  . Anemia   . Thyroid nodule     s/p biopsy  . Graves disease   . Multinodular goiter 10/01/2012  . Lupus anticoagulant positive 10/02/2012  . Hypertension   . 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 Arnoldo Morale on 12/13/2012.  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 Supracervical abdominal hysterectomy  09/27/2012    Procedure: HYSTERECTOMY SUPRACERVICAL ABDOMINAL;  Surgeon: Jonnie Kind, MD;  Location: [facility];  Service: Gynecology;  Laterality: N/A;  . Hematoma evacuation  10/03/2012    Procedure: EVACUATION HEMATOMA;  Surgeon: Jonnie Kind, MD;  Location: [facility];  Service: Gynecology;  Laterality: N/A;  . Thyroidectomy  12/13/2012    Procedure: THYROIDECTOMY;  Surgeon: Jamesetta So, MD;  Location: [facility];  Service: General;  Laterality: Bilateral;   Family History  Problem Relation Age of Onset  . Diabetes Mother   . Hypertension Mother   . Cancer Father     prostate   . Cancer Sister     pancreatic cancer   History  Substance Use Topics  .  Smoking status: Never Smoker   . Smokeless tobacco: Never Used  . Alcohol Use: No   OB History   Grav Para Term Preterm Abortions TAB SAB Ect Mult Living                 Review of Systems  Unable to perform ROS: Other      Allergies  Review of patient's allergies indicates no known allergies.  Home Medications   Prior to Admission medications   Medication Sig Start Date End Date Taking? Authorizing Provider  amLODipine (NORVASC) 5 MG tablet TAKE 1 TABLET BY MOUTH EVERY DAY 07/07/14   Orlena Sheldon, PA-C  calcium-vitamin D (OSCAL 500/200 D-3) 500-200 MG-UNIT per tablet Take 1 tablet by mouth 2 (two) times daily. 12/14/12   Jamesetta So, MD  ferrous sulfate 325 (65 FE) MG tablet Take 325 mg by mouth daily. 10/07/12   Kathie Dike, MD  fluticasone (FLONASE) 50 MCG/ACT nasal spray Place 1 spray into the nose 2 (two) times daily. 01/17/13 01/17/14  Alycia Rossetti, MD  HYDROcodone-acetaminophen (NORCO) 5-325 MG per tablet Take 1-2 tablets by mouth every 4 (four) hours as needed for pain. 12/14/12   Jamesetta So, MD  labetalol (NORMODYNE) 200 MG tablet Take 200 mg by mouth 2 (two) times daily.    Historical Provider, MD  levothyroxine (SYNTHROID)  125 MCG tablet Take 1 tablet (125 mcg total) by mouth daily. 12/14/12   Jamesetta So, MD  ranitidine (ZANTAC) 150 MG tablet Take 1 tablet (150 mg total) by mouth at bedtime. 12/20/12 12/20/13  Alycia Rossetti, MD  warfarin (COUMADIN) 5 MG tablet Take 1 tablet (5 mg total) by mouth daily. Taking 7.5 mg on Mondays and Thursdays and 5 mg all other days. 12/20/12   Alycia Rossetti, MD  zolpidem (AMBIEN) 5 MG tablet Take 5 mg by mouth at bedtime as needed. Sleep.    Historical Provider, MD   BP 148/104  Pulse 118  Temp(Src) 98.5 F (36.9 C) (Oral)  Resp 18  Ht 5\' 9"  (1.753 m)  Wt 278 lb (126.1 kg)  BMI 41.03 kg/m2  SpO2 100%  LMP 09/18/2012 Physical Exam  Nursing note and vitals reviewed. Constitutional: She appears well-developed  and well-nourished.  Hypertensive   HENT:  Head: Normocephalic and atraumatic.  Eyes: Conjunctivae are normal.  Cardiovascular:  Tachycardic per initial triage vs.  Pulmonary/Chest: Effort normal.  Neurological: She is alert.  Psychiatric: Her mood appears anxious.    ED Course  Procedures (including critical care time) Labs Review Labs Reviewed - No data to display  Imaging Review No results found.   EKG Interpretation None      MDM   Final diagnoses:  Worried well    Pt refusing workup/evaluation of her complaint.  Advise return here or see pcp if she changes her mind about evaluation or if she develops sx.    Evalee Jefferson, PA-C 07/07/14 1124

## 2014-07-07 NOTE — ED Notes (Signed)
Pt alert, talking, says she wants to be checked for std , lupus and retaining fld,  Says she did not take her Bp med today.  Also says she take thyroid med.  Denies any other meds.

## 2014-07-07 NOTE — ED Provider Notes (Signed)
Medical screening examination/treatment/procedure(s) were performed by non-physician practitioner and as supervising physician I was immediately available for consultation/collaboration.   EKG Interpretation None       Nat Christen, MD 07/07/14 1226

## 2014-07-07 NOTE — ED Notes (Signed)
Pt wants to be evaluated for STD. Pt denies any current symptoms but reports "my husband may of gave me something."

## 2014-07-07 NOTE — Telephone Encounter (Signed)
Medication filled x1 with no refills.   Requires office visit before any further refills can be given.   Letter sent.  

## 2014-07-08 ENCOUNTER — Encounter (HOSPITAL_COMMUNITY): Payer: Self-pay | Admitting: Emergency Medicine

## 2014-07-08 ENCOUNTER — Emergency Department (HOSPITAL_COMMUNITY)
Admission: EM | Admit: 2014-07-08 | Discharge: 2014-07-11 | Disposition: A | Discharge status: Other Acute Inpatient Hospital | Payer: BC Managed Care – PPO | Attending: Emergency Medicine | Admitting: Emergency Medicine

## 2014-07-08 DIAGNOSIS — M6282 Rhabdomyolysis: Secondary | ICD-10-CM

## 2014-07-08 DIAGNOSIS — F29 Unspecified psychosis not due to a substance or known physiological condition: Secondary | ICD-10-CM | POA: Insufficient documentation

## 2014-07-08 DIAGNOSIS — Z7901 Long term (current) use of anticoagulants: Secondary | ICD-10-CM | POA: Insufficient documentation

## 2014-07-08 DIAGNOSIS — H5316 Psychophysical visual disturbances: Secondary | ICD-10-CM | POA: Insufficient documentation

## 2014-07-08 DIAGNOSIS — R440 Auditory hallucinations: Secondary | ICD-10-CM

## 2014-07-08 DIAGNOSIS — F23 Brief psychotic disorder: Secondary | ICD-10-CM

## 2014-07-08 DIAGNOSIS — Z8742 Personal history of other diseases of the female genital tract: Secondary | ICD-10-CM | POA: Insufficient documentation

## 2014-07-08 DIAGNOSIS — IMO0002 Reserved for concepts with insufficient information to code with codable children: Secondary | ICD-10-CM | POA: Insufficient documentation

## 2014-07-08 DIAGNOSIS — R441 Visual hallucinations: Secondary | ICD-10-CM

## 2014-07-08 DIAGNOSIS — D649 Anemia, unspecified: Secondary | ICD-10-CM | POA: Insufficient documentation

## 2014-07-08 DIAGNOSIS — E038 Other specified hypothyroidism: Secondary | ICD-10-CM | POA: Insufficient documentation

## 2014-07-08 DIAGNOSIS — E876 Hypokalemia: Secondary | ICD-10-CM | POA: Insufficient documentation

## 2014-07-08 DIAGNOSIS — I10 Essential (primary) hypertension: Secondary | ICD-10-CM | POA: Insufficient documentation

## 2014-07-08 DIAGNOSIS — Z86718 Personal history of other venous thrombosis and embolism: Secondary | ICD-10-CM | POA: Insufficient documentation

## 2014-07-08 DIAGNOSIS — R443 Hallucinations, unspecified: Secondary | ICD-10-CM | POA: Insufficient documentation

## 2014-07-08 DIAGNOSIS — E860 Dehydration: Secondary | ICD-10-CM | POA: Insufficient documentation

## 2014-07-08 DIAGNOSIS — Z79899 Other long term (current) drug therapy: Secondary | ICD-10-CM | POA: Insufficient documentation

## 2014-07-08 DIAGNOSIS — N289 Disorder of kidney and ureter, unspecified: Secondary | ICD-10-CM | POA: Insufficient documentation

## 2014-07-08 LAB — ETHANOL: Alcohol, Ethyl (B): <11 mg/dL (ref 0–11)

## 2014-07-08 LAB — CBC WITH DIFFERENTIAL/PLATELET
BASOS ABS: 0 10*3/uL (ref 0.0–0.1)
BASOS PCT: 0 % (ref 0–1)
EOS ABS: 0.1 10*3/uL (ref 0.0–0.7)
EOS PCT: 1 % (ref 0–5)
HCT: 39.2 % (ref 36.0–46.0)
Hemoglobin: 13.1 g/dL (ref 12.0–15.0)
LYMPHS ABS: 1.4 10*3/uL (ref 0.7–4.0)
Lymphocytes Relative: 22 % (ref 12–46)
MCH: 28.6 pg (ref 26.0–34.0)
MCHC: 33.4 g/dL (ref 30.0–36.0)
MCV: 85.6 fL (ref 78.0–100.0)
Monocytes Absolute: 0.5 10*3/uL (ref 0.1–1.0)
Monocytes Relative: 8 % (ref 3–12)
Neutro Abs: 4.4 10*3/uL (ref 1.7–7.7)
Neutrophils Relative %: 69 % (ref 43–77)
PLATELETS: 188 10*3/uL (ref 150–400)
RBC: 4.58 MIL/uL (ref 3.87–5.11)
RDW: 15.3 % (ref 11.5–15.5)
WBC: 6.4 10*3/uL (ref 4.0–10.5)

## 2014-07-08 LAB — COMPREHENSIVE METABOLIC PANEL
ALBUMIN: 3.9 g/dL (ref 3.5–5.2)
ALK PHOS: 99 U/L (ref 39–117)
ALT: 135 U/L — ABNORMAL HIGH (ref 0–35)
AST: 146 U/L — AB (ref 0–37)
Anion gap: 13 (ref 5–15)
BUN: 25 mg/dL — ABNORMAL HIGH (ref 6–23)
CALCIUM: 9.2 mg/dL (ref 8.4–10.5)
CO2: 27 mEq/L (ref 19–32)
Chloride: 102 mEq/L (ref 96–112)
Creatinine, Ser: 1.67 mg/dL — ABNORMAL HIGH (ref 0.50–1.10)
GFR calc non Af Amer: 36 mL/min — ABNORMAL LOW (ref >90)
GFR, EST AFRICAN AMERICAN: 42 mL/min — AB (ref >90)
Glucose, Bld: 104 mg/dL — ABNORMAL HIGH (ref 70–99)
Potassium: 3 mEq/L — ABNORMAL LOW (ref 3.7–5.3)
Sodium: 142 mEq/L (ref 137–147)
TOTAL PROTEIN: 7.9 g/dL (ref 6.0–8.3)
Total Bilirubin: 0.9 mg/dL (ref 0.3–1.2)

## 2014-07-08 LAB — I-STAT CHEM 8, ED
BUN: 19 mg/dL (ref 6–23)
CALCIUM ION: 1.08 mmol/L — AB (ref 1.12–1.23)
CHLORIDE: 107 meq/L (ref 96–112)
Creatinine, Ser: 1.4 mg/dL — ABNORMAL HIGH (ref 0.50–1.10)
Glucose, Bld: 124 mg/dL — ABNORMAL HIGH (ref 70–99)
HCT: 38 % (ref 36.0–46.0)
Hemoglobin: 12.9 g/dL (ref 12.0–15.0)
POTASSIUM: 3.4 meq/L — AB (ref 3.7–5.3)
Sodium: 142 mEq/L (ref 137–147)
TCO2: 24 mmol/L (ref 0–100)

## 2014-07-08 LAB — SALICYLATE LEVEL

## 2014-07-08 LAB — TSH: TSH: 26.25 u[IU]/mL — ABNORMAL HIGH (ref 0.350–4.500)

## 2014-07-08 LAB — ACETAMINOPHEN LEVEL

## 2014-07-08 LAB — PROTIME-INR
INR: 1.1 (ref 0.00–1.49)
PROTHROMBIN TIME: 14.2 s (ref 11.6–15.2)

## 2014-07-08 LAB — T4, FREE: Free T4: 1.12 ng/dL (ref 0.80–1.80)

## 2014-07-08 LAB — APTT: aPTT: 30 seconds (ref 24–37)

## 2014-07-08 LAB — CK: CK TOTAL: 2610 U/L — AB (ref 7–177)

## 2014-07-08 MED ORDER — LORAZEPAM 2 MG/ML IJ SOLN: 2.0000 mg | INTRAMUSCULAR | Status: DC | PRN

## 2014-07-08 MED ORDER — AMLODIPINE BESYLATE 5 MG PO TABS
5.0000 mg | ORAL_TABLET | Freq: Every day | ORAL | Status: DC
  Administered 2014-07-08 – 2014-07-10 (×3): 5 mg via ORAL
  Filled 2014-07-08 (×3): qty 1

## 2014-07-08 MED ORDER — ACETAMINOPHEN 325 MG PO TABS: 650.0000 mg | ORAL_TABLET | ORAL | Status: DC | PRN

## 2014-07-08 MED ORDER — ALUM & MAG HYDROXIDE-SIMETH 200-200-20 MG/5ML PO SUSP: 30.0000 mL | ORAL | Status: DC | PRN

## 2014-07-08 MED ORDER — ZIPRASIDONE MESYLATE 20 MG IM SOLR
20.0000 mg | Freq: Four times a day (QID) | INTRAMUSCULAR | Status: DC | PRN
  Administered 2014-07-10: 20 mg via INTRAMUSCULAR
  Filled 2014-07-08: qty 20

## 2014-07-08 MED ORDER — POTASSIUM CHLORIDE CRYS ER 20 MEQ PO TBCR
40.0000 meq | EXTENDED_RELEASE_TABLET | Freq: Once | ORAL | Status: AC
  Administered 2014-07-08: 40 meq via ORAL
  Filled 2014-07-08: qty 2

## 2014-07-08 MED ORDER — ONDANSETRON HCL 4 MG PO TABS: 4.0000 mg | ORAL_TABLET | Freq: Three times a day (TID) | ORAL | Status: DC | PRN

## 2014-07-08 MED ORDER — ZIPRASIDONE MESYLATE 20 MG IM SOLR
20.0000 mg | Freq: Once | INTRAMUSCULAR | Status: AC
  Administered 2014-07-08: 20 mg via INTRAMUSCULAR
  Filled 2014-07-08: qty 20

## 2014-07-08 MED ORDER — STERILE WATER FOR INJECTION IJ SOLN
INTRAMUSCULAR | Status: AC
  Administered 2014-07-08: 15:00:00
  Filled 2014-07-08: qty 10

## 2014-07-08 MED ORDER — NICOTINE 21 MG/24HR TD PT24
21.0000 mg | MEDICATED_PATCH | Freq: Every day | TRANSDERMAL | Status: DC
  Filled 2014-07-08: qty 1

## 2014-07-08 MED ORDER — SODIUM CHLORIDE 0.9 % IV SOLN
1000.0000 mL | INTRAVENOUS | Status: DC
Start: 2014-07-08 — End: 2014-07-09
  Administered 2014-07-09: 1000 mL via INTRAVENOUS

## 2014-07-08 MED ORDER — SODIUM CHLORIDE 0.9 % IV BOLUS (SEPSIS)
1000.0000 mL | Freq: Once | INTRAVENOUS | Status: AC
  Administered 2014-07-08: 1000 mL via INTRAVENOUS

## 2014-07-08 MED ORDER — LORAZEPAM 2 MG/ML IJ SOLN
2.0000 mg | Freq: Once | INTRAMUSCULAR | Status: AC
  Administered 2014-07-08: 2 mg via INTRAMUSCULAR
  Filled 2014-07-08: qty 1

## 2014-07-08 MED ORDER — ZOLPIDEM TARTRATE 5 MG PO TABS: 10.0000 mg | ORAL_TABLET | Freq: Every evening | ORAL | Status: DC | PRN

## 2014-07-08 MED ORDER — SODIUM CHLORIDE 0.9 % IV SOLN
1000.0000 mL | Freq: Once | INTRAVENOUS | Status: AC
  Administered 2014-07-08: 1000 mL via INTRAVENOUS

## 2014-07-08 MED ORDER — IBUPROFEN 400 MG PO TABS: 600.0000 mg | ORAL_TABLET | Freq: Three times a day (TID) | ORAL | Status: DC | PRN

## 2014-07-08 MED ORDER — LEVOTHYROXINE SODIUM 125 MCG PO TABS
ORAL_TABLET | ORAL | Status: AC
  Filled 2014-07-08: qty 1

## 2014-07-08 MED ORDER — LEVOTHYROXINE SODIUM 125 MCG PO TABS
125.0000 ug | ORAL_TABLET | Freq: Every day | ORAL | Status: DC
  Administered 2014-07-08: 125 ug via ORAL
  Filled 2014-07-08 (×2): qty 1

## 2014-07-08 MED ORDER — ONDANSETRON HCL 4 MG/2ML IJ SOLN
INTRAMUSCULAR | Status: AC
  Administered 2014-07-08: 8 mg
  Filled 2014-07-08: qty 4

## 2014-07-08 MED ORDER — LORAZEPAM 1 MG PO TABS
1.0000 mg | ORAL_TABLET | Freq: Three times a day (TID) | ORAL | Status: DC | PRN
  Administered 2014-07-09 – 2014-07-10 (×2): 1 mg via ORAL
  Filled 2014-07-08 (×2): qty 1

## 2014-07-08 MED ORDER — POTASSIUM CHLORIDE CRYS ER 20 MEQ PO TBCR
40.0000 meq | EXTENDED_RELEASE_TABLET | Freq: Three times a day (TID) | ORAL | Status: AC
  Administered 2014-07-09 – 2014-07-10 (×3): 40 meq via ORAL
  Filled 2014-07-08 (×5): qty 2

## 2014-07-08 NOTE — BH Assessment (Signed)
Tele Assessment Note   [name] is an 43 y.o. female, married, African-American who presents to [facility] ED accompanied by Event organiser. Pt was found on the side of the road near a hotel shouting at individuals walking along. Shortly after triage, pt was noted having an active conversation with someone sitting in an empty chair beside her. Pt also attempted to leave the emergency room and was brought back to ED by law enforcement. Pt reports she is in the ED because she was "dehydrated and hot." She reports that God told her "in visions" that her husband is bisexual and he is putting things in her food trying to harm her. She says that God and spirits are telling her things about people and that everyone knew he was bisexual but no one told her. She said she had to leave their house because "I had to protect myself" and Pt fears she will contract HIV or some other disease. When asked about the report she was shouting a people on the street she gives and incoherent story that someone was supposed to pay for her room in a local hotel. Pt denies depressive symptoms. She denies current suicidal ideation or history of suicide attempts. She denies intention al self-injurious behaviors. She denies homicidal ideation to history of violence. She denies any alcohol or substance abuse. When asked if she was experiencing any auditory or visual hallucinations she said "I know what you are talking about" but would not elaborate further.   Pt reports she does not have a permanent place to stay. She said her husband died this week but then referred to him as alive. She then said that she wasn't sure where he is currently. Pt gave husband's name as Johnnae Impastato with telephone number [phone]-- this LPC attempted to contact husband without success. Pt reports she has no family or friends. She denies any history of inpatient or outpatient mental health or substance abuse treatment. She denies any legal  problems.  Pt is disheveled and dressed in a hospital gown. She is alert and oriented to person and place but not date, year or situation. Pt's speech is of normal rate and volume and motor behavior is normal. Eye contact is fair. Pt's mood is somewhat angry when discussing her hisband and affect is congruent with mood. Thought process is circumstantial. Pt appears to currently be responding to internal stimuli and her thought content is paranoid with religious delusions. She was cooperative throughout assessment.   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 Thyroid disease   . Fibroid tumor   . DVT (deep venous thrombosis) 09/30/12    in upper extremities b/l and LLE  . Anemia   . Thyroid nodule     s/p biopsy  . Graves disease   . Multinodular goiter 10/01/2012  . Lupus anticoagulant positive 10/02/2012  . Hypertension   . 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 Arnoldo Morale on 12/13/2012.  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 Thyroid disease   . Fibroid tumor   . DVT (deep venous thrombosis) 09/30/12  in upper extremities b/l and LLE  . Anemia   . Thyroid nodule     s/p biopsy  . Graves disease   . Multinodular goiter 10/01/2012  . Lupus anticoagulant positive 10/02/2012  . Hypertension   . 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 Arnoldo Morale on 12/13/2012.  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 Supracervical abdominal hysterectomy  09/27/2012    Procedure: HYSTERECTOMY  SUPRACERVICAL ABDOMINAL;  Surgeon: Jonnie Kind, MD;  Location: [facility];  Service: Gynecology;  Laterality: N/A;  . Hematoma evacuation  10/03/2012    Procedure: EVACUATION HEMATOMA;  Surgeon: Jonnie Kind, MD;  Location: [facility];  Service: Gynecology;  Laterality: N/A;  . Thyroidectomy  12/13/2012    Procedure: THYROIDECTOMY;  Surgeon: Jamesetta So, MD;  Location: [facility];  Service: General;  Laterality: Bilateral;    Family History:  Family History  Problem Relation Age of Onset  . Diabetes Mother   . Hypertension Mother   . Cancer Father     prostate   . Cancer Sister     pancreatic cancer    Social History:  reports that she has never smoked. She has never used smokeless tobacco. She reports that she does not drink alcohol or use illicit drugs.  Additional Social History:  Alcohol / Drug Use Pain Medications: Denies abuse Prescriptions: Denies abuse Over the Counter: Denies abuse History of alcohol / drug use?: No history of alcohol / drug abuse Longest period of sobriety (when/how long): NA  CIWA: CIWA-Ar BP: 149/96 mmHg Pulse Rate: 90 COWS:    Allergies: No Known Allergies  Home Medications:  (Not in a hospital admission)  OB/GYN Status:  Patient's last menstrual period was 09/18/2012.  General Assessment Data Location of Assessment: [facility] ED Is this a Tele or Face-to-Face Assessment?: Tele Assessment Is this an Initial Assessment or a Re-assessment for this encounter?: Initial Assessment Living Arrangements: Other (Comment) (Possibly homeless, possibly lives with husband) Can pt return to current living arrangement?: Yes Admission Status: Involuntary Is patient capable of signing voluntary admission?: Yes Transfer from: [facility] Referral Source: Other Risk manager)     [facility] Crisis Care Plan Living Arrangements: Other (Comment) (Possibly homeless, possibly lives with husband) Name of Psychiatrist: None Name of Therapist: None  Education  Status Is patient currently in school?: No Current Grade: NA Highest grade of school patient has completed: NA Name of school: NA Contact person: NA  Risk to self Suicidal Ideation: No Suicidal Intent: No Is patient at risk for suicide?: No Suicidal Plan?: No Access to Means: No What has been your use of drugs/alcohol within the last 12 months?: Pt denies Previous Attempts/Gestures: No How many times?: 0 Other Self Harm Risks: Pt appears psychotic Triggers for Past Attempts: None known Intentional Self Injurious Behavior: None Family Suicide History: No Recent stressful life event(s): Conflict (Comment) (Pt believes husband is trying to kill her) Persecutory voices/beliefs?: Yes Depression: No Substance abuse history and/or treatment for substance abuse?: No Suicide prevention information given to non-admitted patients: Not applicable  Risk to Others Homicidal Ideation: No Thoughts of Harm to Others: No Current Homicidal Intent: No Current Homicidal Plan: No Access to Homicidal Means: No Identified Victim: None History of harm to others?: No Assessment of Violence: None Noted Violent Behavior Description: None Does patient have access to weapons?: No Criminal Charges Pending?: No Does patient have  a court date: No  Psychosis Hallucinations: Auditory;Visual (Pt appears to be responding to internal stimuli) Delusions: Persecutory;Grandiose (Pt appears paranoid with religious delusions)  Mental Status Report Appear/Hygiene: Disheveled Eye Contact: Fair Motor Activity: Unremarkable Speech: Logical/coherent Level of Consciousness: Alert Mood: Angry Affect: Preoccupied Anxiety Level: Minimal Thought Processes: Circumstantial Judgement: Impaired Orientation: Person;Place Obsessive Compulsive Thoughts/Behaviors: None  Cognitive Functioning Concentration: Decreased Memory: Recent Intact;Remote Intact IQ: Average Insight: Poor Impulse Control: Poor Appetite:  Fair Weight Loss: 0 Weight Gain: 0 Sleep: No Change Total Hours of Sleep: 8 Vegetative Symptoms: Decreased grooming  ADLScreening [facility] Assessment Services) Patient's cognitive ability adequate to safely complete daily activities?: Yes Patient able to express need for assistance with ADLs?: Yes Independently performs ADLs?: Yes (appropriate for developmental age)  Prior Inpatient Therapy Prior Inpatient Therapy: No Prior Therapy Dates: NA Prior Therapy Facilty/Provider(s): NA Reason for Treatment: NA  Prior Outpatient Therapy Prior Outpatient Therapy: No Prior Therapy Dates: NA Prior Therapy Facilty/Provider(s): NA Reason for Treatment: NA  ADL Screening (condition at time of admission) Patient's cognitive ability adequate to safely complete daily activities?: Yes Is the patient deaf or have difficulty hearing?: No Does the patient have difficulty seeing, even when wearing glasses/contacts?: No Does the patient have difficulty concentrating, remembering, or making decisions?: No Patient able to express need for assistance with ADLs?: Yes Does the patient have difficulty dressing or bathing?: No Independently performs ADLs?: Yes (appropriate for developmental age) Does the patient have difficulty walking or climbing stairs?: No Weakness of Legs: None Weakness of Arms/Hands: None  Home Assistive Devices/Equipment Home Assistive Devices/Equipment: None    Abuse/Neglect Assessment (Assessment to be complete while patient is alone) Physical Abuse: Denies Verbal Abuse: Denies Sexual Abuse: Denies Exploitation of patient/patient's resources: Denies Self-Neglect: Denies Values / Beliefs Cultural Requests During Hospitalization: None Spiritual Requests During Hospitalization: None   Advance Directives (For Healthcare) Advance Directive: Patient does not have advance directive;Patient would not like information Pre-existing out of facility DNR order (yellow form or pink MOST  form): No Nutrition Screen- [facility] Adult/[facility] Patient's home diet: Regular  Additional Information 1:1 In Past 12 Months?: No CIRT Risk: No Elopement Risk: Yes Does patient have medical clearance?: Yes     Disposition: Nash Mantis, AC at [facility], confirms adult unit is at capacity. Pt meets criteria for inpatient psychiatric treatment and TTS will contact other facilities for placement. Discussed recommendation with Dr. Roderic Palau.  Disposition Initial Assessment Completed for this Encounter: Yes Disposition of Patient: Other dispositions Other disposition(s): Other (Comment) ([facility] at capacity. TTS will contact other facilities.)  Orpah Greek Anson Fret, [facility], [facility] Triage Specialist [phone]   Evelena Peat 07/08/2014 11:23 PM

## 2014-07-08 NOTE — ED Notes (Signed)
Pt talking out loud to people not there. Communicating through tv channels and [place]. Pt currently in handcuffs and accompanied by 2 [place] police officers.

## 2014-07-08 NOTE — ED Notes (Signed)
Dr Tomi Bamberger in to evaluate pt. Pt stated she was leaving. Dr Tomi Bamberger requested for security to be called and stated she was going to fill out IVC paperwork. Pt left hospital property at this time. [place] PD notified.

## 2014-07-08 NOTE — ED Notes (Signed)
PT was found on side walk near a hotel stating bible verses at people passing by. PT denies any SI/HI.

## 2014-07-08 NOTE — BH Assessment (Signed)
Received call for assessment. Spoke with Dr. Roderic Palau who said Pt is hallucinating and responding to people who are not there. Tele-assessment will be initiated.  Orpah Greek Rosana Hoes, [facility] Triage Specialist [phone]

## 2014-07-08 NOTE — ED Notes (Signed)
Pt singing to herself & then will start talking to someone. When asked who the pt is talking to she says "she is talking to the [place]."

## 2014-07-08 NOTE — ED Provider Notes (Signed)
CSN: [id number]     Arrival date & time 07/08/14  1310 History   First MD Initiated Contact with Patient 07/08/14 1357     This chart was scribed for Janice Norrie, MD by Forrestine Him, ED Scribe. This patient was seen in room APA16A/APA16A and the patient's care was started 2:20 PM.   Chief Complaint  Patient presents with  . V70.1     The history is provided by the patient. No language interpreter was used.   LEVEL 5 CAVEAT DUE TO PSYCHIATRIC DISORDER  HPI Comments: [name] is a 43 y.o. Female brought in by police who presents to the Emergency Department complaining of auditory and visual hallucinations onset today. Pt was found on the side of the road near a hotel shouting at individuals walking along. Shortly after triage, pt was noted having an active conversation with someone sitting in an empty chair beside her.She continues to have this conversation and does not interact with the staff. She even discusses the staff with her imaginary friend.   PCP Dr Buelah Manis  Past Medical History  Diagnosis Date  . Thyroid disease   . Fibroid tumor   . DVT (deep venous thrombosis) 09/30/12    in upper extremities b/l and LLE  . Anemia   . Thyroid nodule     s/p biopsy  . Graves disease   . Multinodular goiter 10/01/2012  . Lupus anticoagulant positive 10/02/2012  . Hypertension   . 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 Arnoldo Morale on 12/13/2012.  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 Supracervical abdominal hysterectomy  09/27/2012    Procedure: HYSTERECTOMY SUPRACERVICAL ABDOMINAL;  Surgeon: Jonnie Kind, MD;  Location: [facility];  Service: Gynecology;  Laterality: N/A;  . Hematoma evacuation  10/03/2012    Procedure: EVACUATION HEMATOMA;  Surgeon: Jonnie Kind, MD;  Location: [facility];  Service: Gynecology;  Laterality: N/A;  . Thyroidectomy  12/13/2012    Procedure:  THYROIDECTOMY;  Surgeon: Jamesetta So, MD;  Location: [facility];  Service: General;  Laterality: Bilateral;   Family History  Problem Relation Age of Onset  . Diabetes Mother   . Hypertension Mother   . Cancer Father     prostate   . Cancer Sister     pancreatic cancer   History  Substance Use Topics  . Smoking status: Never Smoker   . Smokeless tobacco: Never Used  . Alcohol Use: No   Unknown if she works.   OB History   Grav Para Term Preterm Abortions TAB SAB Ect Mult Living                 Review of Systems  Unable to perform ROS: Psychiatric disorder      Allergies  Review of patient's allergies indicates no known allergies.  Home Medications   Prior to Admission medications   Medication Sig Start Date End Date Taking? Authorizing Provider  amLODipine (NORVASC) 5 MG tablet TAKE 1 TABLET BY MOUTH EVERY DAY 07/07/14   Orlena Sheldon, PA-C  calcium-vitamin D (OSCAL 500/200 D-3) 500-200 MG-UNIT per tablet Take 1 tablet by mouth 2 (two) times daily. 12/14/12   Jamesetta So, MD  ferrous sulfate 325 (65 FE) MG tablet Take 325 mg by mouth daily. 10/07/12   Kathie Dike, MD  fluticasone (FLONASE) 50 MCG/ACT nasal spray Place 1 spray into the  nose 2 (two) times daily. 01/17/13 01/17/14  Alycia Rossetti, MD  HYDROcodone-acetaminophen (NORCO) 5-325 MG per tablet Take 1-2 tablets by mouth every 4 (four) hours as needed for pain. 12/14/12   Jamesetta So, MD  labetalol (NORMODYNE) 200 MG tablet Take 200 mg by mouth 2 (two) times daily.    Historical Provider, MD  levothyroxine (SYNTHROID) 125 MCG tablet Take 1 tablet (125 mcg total) by mouth daily. 12/14/12   Jamesetta So, MD  ranitidine (ZANTAC) 150 MG tablet Take 1 tablet (150 mg total) by mouth at bedtime. 12/20/12 12/20/13  Alycia Rossetti, MD  warfarin (COUMADIN) 5 MG tablet Take 1 tablet (5 mg total) by mouth daily. Taking 7.5 mg on Mondays and Thursdays and 5 mg all other days. 12/20/12   Alycia Rossetti, MD  zolpidem  (AMBIEN) 5 MG tablet Take 5 mg by mouth at bedtime as needed. Sleep.    Historical Provider, MD   Triage Vitals: BP 152/106  Pulse 104  Temp(Src) 98.1 F (36.7 C) (Oral)  Resp 18  Ht 5\' 4"  (1.626 m)  Wt 278 lb (126.1 kg)  BMI 47.70 kg/m2  SpO2 100%  LMP 09/18/2012   Vital signs normal except for hypertension, tachycardia   Physical Exam  Nursing note and vitals reviewed. Constitutional: She appears well-developed and well-nourished.  HENT:  Head: Normocephalic and atraumatic.  Eyes: Conjunctivae and EOM are normal. Pupils are equal, round, and reactive to light.  Neck: Normal range of motion. Neck supple.  Cardiovascular: Normal rate, regular rhythm and normal heart sounds.   Pulmonary/Chest: Effort normal and breath sounds normal.  Abdominal: Soft. Bowel sounds are normal.  Musculoskeletal: Normal range of motion.  Neurological: She is alert. She has normal strength. No cranial nerve deficit. Coordination and gait normal.  Skin: Skin is warm and dry.  Psychiatric: Her affect is inappropriate. Her speech is rapid and/or pressured. She is agitated, hyperactive and actively hallucinating. Thought content is paranoid. Cognition and memory are impaired.  Constantly talking Agitated Speaking loudly    ED Course  Procedures (including critical care time)  Medications  0.9 %  sodium chloride infusion (1,000 mLs Intravenous New Bag/Given 07/08/14 1757)    Followed by  0.9 %  sodium chloride infusion (1,000 mLs Intravenous New Bag/Given 07/08/14 1757)    Followed by  0.9 %  sodium chloride infusion (not administered)  potassium chloride SA (K-DUR,KLOR-CON) CR tablet 40 mEq (not administered)  amLODipine (NORVASC) tablet 5 mg (not administered)  levothyroxine (SYNTHROID, LEVOTHROID) tablet 125 mcg (not administered)  ziprasidone (GEODON) injection 20 mg (20 mg Intramuscular Given 07/08/14 1433)  sterile water (preservative free) injection (  Given 07/08/14 1433)  LORazepam (ATIVAN)  injection 2 mg (2 mg Intramuscular Given 07/08/14 1711)  ondansetron (ZOFRAN) 4 MG/2ML injection (8 mg  Given 07/08/14 1711)  potassium chloride SA (K-DUR,KLOR-CON) CR tablet 40 mEq (40 mEq Oral Given 07/08/14 1953)     DIAGNOSTIC STUDIES: Oxygen Saturation is 100% on RA, Normal by my interpretation.    COORDINATION OF CARE: Pt given geodon for her psychosis.  Review of her prior ED visit diagnoses that she is never had a diagnosis of psychiatric illness. However she did come to the ED yesterday wanted to be checked for an STD however she left without being examined. She told the PA she had changed her mind.  1600 patient continues to be awake. She is not talking as much as she was. However when I went to talk to her  she states she is leaving now. She walked out of the ED and went down the street. Police had to be called to pick her up. IVC papers were filled out by myself at 1615.  After reviewing her labs she was given IV fluids for probable dehydration with new acute renal insufficiency and mild rhabdomyolysis. I was concerned she may have been thyrotoxic however her TSH is high meaning she has not been taking her thyroid medication. Her TSH level has always been in the normal range before. She was started on oral potassium for her hypokalemia.  Review of her chart shows she has not seen her PCP in over a year. She also was dismissed from the hematology clinic for her DVT for noncompliance with her visits and her Coumadin.  1945 patient was discussed with Dr. Humphrey Rolls, hospitalist. He feels she can have a couple liters of fluid and recheck her labs and hopefully she will be improved enough that she can be evaluated for psychiatric admission.  Repeat i-STAT at 9:30 shows improving renal insufficiency. Patient will be given more IV fluids and her labs but will be repeated around midnight.  Patient should be ready for psychiatric evaluation tonight.   Review of her hematologist note from August  2014: Release the patient from the clinic. Her DVT was likely incited by her acute infection last year. She was noncompliant with anticoagulation. Her repeat Hypercoag panel was performed on 07/14/2013 and was negative for any abnormalities or explanations for DVT. Released from clinic.     Labs Review Results for orders placed during the hospital encounter of 07/08/14  CBC WITH DIFFERENTIAL      Result Value Ref Range   WBC 6.4  4.0 - 10.5 K/uL   RBC 4.58  3.87 - 5.11 MIL/uL   Hemoglobin 13.1  12.0 - 15.0 g/dL   HCT 39.2  36.0 - 46.0 %   MCV 85.6  78.0 - 100.0 fL   MCH 28.6  26.0 - 34.0 pg   MCHC 33.4  30.0 - 36.0 g/dL   RDW 15.3  11.5 - 15.5 %   Platelets 188  150 - 400 K/uL   Neutrophils Relative % 69  43 - 77 %   Neutro Abs 4.4  1.7 - 7.7 K/uL   Lymphocytes Relative 22  12 - 46 %   Lymphs Abs 1.4  0.7 - 4.0 K/uL   Monocytes Relative 8  3 - 12 %   Monocytes Absolute 0.5  0.1 - 1.0 K/uL   Eosinophils Relative 1  0 - 5 %   Eosinophils Absolute 0.1  0.0 - 0.7 K/uL   Basophils Relative 0  0 - 1 %   Basophils Absolute 0.0  0.0 - 0.1 K/uL  COMPREHENSIVE METABOLIC PANEL      Result Value Ref Range   Sodium 142  137 - 147 mEq/L   Potassium 3.0 (*) 3.7 - 5.3 mEq/L   Chloride 102  96 - 112 mEq/L   CO2 27  19 - 32 mEq/L   Glucose, Bld 104 (*) 70 - 99 mg/dL   BUN 25 (*) 6 - 23 mg/dL   Creatinine, Ser 1.67 (*) 0.50 - 1.10 mg/dL   Calcium 9.2  8.4 - 10.5 mg/dL   Total Protein 7.9  6.0 - 8.3 g/dL   Albumin 3.9  3.5 - 5.2 g/dL   AST 146 (*) 0 - 37 U/L   ALT 135 (*) 0 - 35 U/L   Alkaline Phosphatase 99  39 - 117 U/L   Total Bilirubin 0.9  0.3 - 1.2 mg/dL   GFR calc non Af Amer 36 (*) >90 mL/min   GFR calc Af Amer 42 (*) >90 mL/min   Anion gap 13  5 - 15  APTT      Result Value Ref Range   aPTT 30  24 - 37 seconds  PROTIME-INR      Result Value Ref Range   Prothrombin Time 14.2  11.6 - 15.2 seconds   INR 1.10  0.00 - 1.49  ACETAMINOPHEN LEVEL      Result Value Ref Range    Acetaminophen (Tylenol), Serum <15.0  10 - 30 ug/mL  SALICYLATE LEVEL      Result Value Ref Range   Salicylate Lvl <2.6 (*) 2.8 - 20.0 mg/dL  ETHANOL      Result Value Ref Range   Alcohol, Ethyl (B) <11  0 - 11 mg/dL  TSH      Result Value Ref Range   TSH 26.250 (*) 0.350 - 4.500 uIU/mL  CK      Result Value Ref Range   Total CK 2610 (*) 7 - 177 U/L  I-STAT CHEM 8, ED      Result Value Ref Range   Sodium 142  137 - 147 mEq/L   Potassium 3.4 (*) 3.7 - 5.3 mEq/L   Chloride 107  96 - 112 mEq/L   BUN 19  6 - 23 mg/dL   Creatinine, Ser 1.40 (*) 0.50 - 1.10 mg/dL   Glucose, Bld 124 (*) 70 - 99 mg/dL   Calcium, Ion 1.08 (*) 1.12 - 1.23 mmol/L   TCO2 24  0 - 100 mmol/L   Hemoglobin 12.9  12.0 - 15.0 g/dL   HCT 38.0  36.0 - 46.0 %   Laboratory interpretation all normal except new acute mild renal insufficiency, noncompliance with her thyroid medication with elevated TSH, mild rhabdomyolysis  Repeat i-STAT at 9:30 shows improving renal insufficiency.    Imaging Review No results found.   EKG Interpretation None      MDM   Final diagnoses:  Acute psychosis  Auditory hallucinations  Visual hallucinations  Hypokalemia  Other specified hypothyroidism  Acute renal insufficiency  Dehydration  Non-traumatic rhabdomyolysis   Disposition pending  Rolland Porter, MD, FACEP    I personally performed the services described in this documentation, which was scribed in my presence. The recorded information has been reviewed and considered.  Rolland Porter, MD, Abram Sander     Janice Norrie, MD 07/08/14 2158

## 2014-07-08 NOTE — ED Notes (Signed)
Pt escorted back to room by [place] PD and [facility] security.

## 2014-07-08 NOTE — ED Notes (Signed)
Pt refuse rectal temperature to be taken.

## 2014-07-08 NOTE — BH Assessment (Signed)
Assessment complete. Nash Mantis, AC at [facility], confirms adult unit is at capacity. Pt meets criteria for inpatient psychiatric treatment and TTS will contact other facilities for placement. Discussed recommendation with Dr. Roderic Palau.  Orpah Greek Rosana Hoes, [facility] Triage Specialist [phone]

## 2014-07-09 LAB — URINALYSIS, ROUTINE W REFLEX MICROSCOPIC
Bilirubin Urine: NEGATIVE
GLUCOSE, UA: NEGATIVE mg/dL
Ketones, ur: NEGATIVE mg/dL
LEUKOCYTES UA: NEGATIVE
Nitrite: NEGATIVE
Protein, ur: NEGATIVE mg/dL
SPECIFIC GRAVITY, URINE: 1.015 (ref 1.005–1.030)
UROBILINOGEN UA: 1 mg/dL (ref 0.0–1.0)
pH: 6 (ref 5.0–8.0)

## 2014-07-09 LAB — RAPID URINE DRUG SCREEN, HOSP PERFORMED
AMPHETAMINES: NOT DETECTED
Barbiturates: NOT DETECTED
Benzodiazepines: NOT DETECTED
COCAINE: NOT DETECTED
OPIATES: NOT DETECTED
TETRAHYDROCANNABINOL: NOT DETECTED

## 2014-07-09 LAB — I-STAT CHEM 8, ED
BUN: 14 mg/dL (ref 6–23)
BUN: 9 mg/dL (ref 6–23)
CALCIUM ION: 1.21 mmol/L (ref 1.12–1.23)
CHLORIDE: 100 meq/L (ref 96–112)
Calcium, Ion: 1.04 mmol/L — ABNORMAL LOW (ref 1.12–1.23)
Chloride: 112 mEq/L (ref 96–112)
Creatinine, Ser: 1.3 mg/dL — ABNORMAL HIGH (ref 0.50–1.10)
Creatinine, Ser: 1.3 mg/dL — ABNORMAL HIGH (ref 0.50–1.10)
GLUCOSE: 132 mg/dL — AB (ref 70–99)
Glucose, Bld: 106 mg/dL — ABNORMAL HIGH (ref 70–99)
HCT: 35 % — ABNORMAL LOW (ref 36.0–46.0)
HEMATOCRIT: 37 % (ref 36.0–46.0)
HEMOGLOBIN: 11.9 g/dL — AB (ref 12.0–15.0)
Hemoglobin: 12.6 g/dL (ref 12.0–15.0)
POTASSIUM: 3.1 meq/L — AB (ref 3.7–5.3)
Potassium: 2.9 mEq/L — CL (ref 3.7–5.3)
SODIUM: 140 meq/L (ref 137–147)
Sodium: 144 mEq/L (ref 137–147)
TCO2: 21 mmol/L (ref 0–100)
TCO2: 25 mmol/L (ref 0–100)

## 2014-07-09 LAB — URINE MICROSCOPIC-ADD ON

## 2014-07-09 LAB — CK
Total CK: 1784 U/L — ABNORMAL HIGH (ref 7–177)
Total CK: 1540 U/L — ABNORMAL HIGH (ref 7–177)

## 2014-07-09 MED ORDER — POTASSIUM CHLORIDE CRYS ER 20 MEQ PO TBCR
40.0000 meq | EXTENDED_RELEASE_TABLET | Freq: Once | ORAL | Status: AC
  Administered 2014-07-09: 40 meq via ORAL
  Filled 2014-07-09: qty 2

## 2014-07-09 MED ORDER — SODIUM CHLORIDE 0.9 % IV BOLUS (SEPSIS)
2000.0000 mL | Freq: Once | INTRAVENOUS | Status: AC
  Administered 2014-07-09: 2000 mL via INTRAVENOUS

## 2014-07-09 MED ORDER — LEVOTHYROXINE SODIUM 100 MCG PO TABS
100.0000 ug | ORAL_TABLET | Freq: Every day | ORAL | Status: DC
  Administered 2014-07-09 – 2014-07-11 (×3): 100 ug via ORAL
  Filled 2014-07-09 (×7): qty 1

## 2014-07-09 MED ORDER — ZOLPIDEM TARTRATE 5 MG PO TABS: 5.0000 mg | ORAL_TABLET | Freq: Every evening | ORAL | Status: DC | PRN

## 2014-07-09 NOTE — ED Notes (Signed)
Pt given breakfast tray, [employer] remains at bedside,

## 2014-07-09 NOTE — ED Notes (Signed)
Repeat tele psych completed,

## 2014-07-09 NOTE — ED Notes (Signed)
Tele psych machine moved to pt's room for re-assessment.

## 2014-07-09 NOTE — ED Notes (Signed)
Pt agitated, states "I am leaving at 4pm today", [employer] remains at bedside,

## 2014-07-09 NOTE — Progress Notes (Signed)
Follow-up calls placed to the following facilities regarding bed availability for inptx: [facility]- at [facility]- per Kerry Dory at [facility]- per [facility] it has been reviewed yet [facility]- has not been reviewed [facility]- per Eustaquio Maize are waiting to verify pt's insurance  The following facilities also contacted but are at capacity: Rosana Hoes- per Elliot Gault- per Dorcas Carrow- per Cornelia Copa- per Katrina Stack- per [facility]- per Keturah Shavers- per [facility]- per [facility]- per [facility]- per Aurelio Brash- per Inez Pilgrim- per Junie Panning not reviewing referrals until Mon 7.13.15 [place]- per [facility]- per [place] Disposition MHT

## 2014-07-09 NOTE — ED Notes (Addendum)
Pt pulled her iv out, cath intact, band aid applied. Per Dr Lacinda Axon iv fluids can be discontinued.

## 2014-07-09 NOTE — ED Provider Notes (Signed)
Change of shift - care signed out to Dr. Sunday Shams, MD 07/09/14 [phone]

## 2014-07-09 NOTE — Progress Notes (Signed)
MHT initiated bed placement at the following inpatient psychiatric facilities:  1)[facility]-faxed referral 2)[facility]-no beds 3)[facility]-no beds 4)[facility]-no beds 5)[facility]-no beds 6)[facility]-faxed referral 7)[facility]-faxed referral 8)[facility]-no beds 9)[facility]-faxed referral 10)[facility]-faxed referral  Wyvonnia Dusky, MHT/NS

## 2014-07-09 NOTE — BH Assessment (Signed)
[facility] Assessment Progress Note  CSW reassessed pt at this time.  Pt was observed lying in the bed, initially with the light off but a tech turned the light on.  Pt was irritable throughout the assessment and didn't want to talk to CSW.  Pt stated that we are going to send her to the "[place] [facility] any ways so just do it".  Pt states that she wants to go home.  Pt denies SI/HI and AVH but appears to continue to be delusional and agitated.  Pt continues to state that she didn't want to talk to [place] before finally shutting down and refusing to talk.  Once CSW ended the conversation pt popped up out of bed and screamed to the tech "I said I don't want to talk anymore!".  TTS to continue seeking placement.

## 2014-07-09 NOTE — ED Notes (Signed)
Patient resting in bed, even rise and fall of chest. No needs voiced at this time. [employer] at bedside.

## 2014-07-10 LAB — I-STAT CHEM 8, ED
BUN: 8 mg/dL (ref 6–23)
CHLORIDE: 97 meq/L (ref 96–112)
Calcium, Ion: 1.25 mmol/L — ABNORMAL HIGH (ref 1.12–1.23)
Creatinine, Ser: 1.3 mg/dL — ABNORMAL HIGH (ref 0.50–1.10)
Glucose, Bld: 96 mg/dL (ref 70–99)
HEMATOCRIT: 40 % (ref 36.0–46.0)
Hemoglobin: 13.6 g/dL (ref 12.0–15.0)
Potassium: 3.2 mEq/L — ABNORMAL LOW (ref 3.7–5.3)
Sodium: 143 mEq/L (ref 137–147)
TCO2: 26 mmol/L (ref 0–100)

## 2014-07-10 LAB — CK: Total CK: 1577 U/L — ABNORMAL HIGH (ref 7–177)

## 2014-07-10 NOTE — ED Notes (Signed)
Pt. Tearful and apologetic for her behavior today, pt. udpated as plan to go to [facility].

## 2014-07-10 NOTE — Progress Notes (Signed)
Roderic Palau, RN at [facility] called to report business office running pt insurance.  [facility] to call back in AM regarding possible bed placement.  Wyvonnia Dusky, MHT/NS

## 2014-07-10 NOTE — ED Notes (Signed)
Crackers and water given to patient at patient request

## 2014-07-10 NOTE — ED Notes (Signed)
Patient ambulatory to desk stating "im the prophet they were talking about on the tv, they said I could go at 8am." patient also states "Im the one that was supposed to be at the court house." patient escorted back to bed by [facility] department.

## 2014-07-10 NOTE — BH Assessment (Signed)
[facility] Assessment Progress Note  Pt accepted to [facility] by Enzo Bi, MD, per Caryl Pina.  However, she is not to be transported until tomorrow, 07/11/14, after 08:00.  She will go to [facility].  Please call nurse-to-nurse report to [phone].  At 17:20 I called [facility] and spoke pt's nurse, Eduard Clos, to notify her.  Jalene Mullet, MA Triage Specialist 07/10/2014 @ 17:23

## 2014-07-10 NOTE — ED Notes (Signed)
Call from [place] at [facility], pt. Accepted to [facility]) , not to transported until after 0800 in the a.m., note in chart with # for nurse report.

## 2014-07-10 NOTE — ED Notes (Signed)
Pt. Claiming no one has updated her, athough Dr. Thurnell Garbe was recently at bedside, tried to give pt. Potassium pills, pt. Preceded to take pills, and when I asked to see if she swallowed pills, she walked outside room and spit them out.

## 2014-07-10 NOTE — ED Notes (Signed)
Pt. Starting to get restless, coming out to nurses station, redircted by RDP to room

## 2014-07-10 NOTE — ED Notes (Addendum)
Pt. Smells, refusing to take a bath, pt. Pacing room, refusing meal tray

## 2014-07-10 NOTE — ED Notes (Signed)
MD at bedside. 

## 2014-07-10 NOTE — ED Notes (Signed)
Patient ambulatory out to desk stating "im going to the waiting room" patient moving toward exit, confronted by [employer], and [employer] responded to nurses station for assistance.

## 2014-07-10 NOTE — ED Notes (Signed)
Upon initial assessment pt states that she is the prophet and she is leaving here today and we better find Sam Page.

## 2014-07-10 NOTE — BHH Counselor (Addendum)
Shelly at [facility] - pt has been declined d/t having no beds. They aren't expecting any discharges today and don't keep referrals.  Arnold Long, [place] Assessment Counselor 11:58   Tameka at [facility] - pt's insurance has expired. They have no [facility] beds available.   Arnold Long, [place] Assessment Counselor [phone]  Randa at [facility] - they don't have a referral for pt. She reports that they have no beds and currently have 33 referrals to review. She says TTS can fax referral but there is little chance of getting a bed.  Arnold Long, [place] Assessment Counselor [phone]
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
## 2014-07-11 NOTE — BH Assessment (Addendum)
[facility] Assessment Progress Note  07/11/2014  Pt accepted to [facility]. Accepted MD is Enzo Bi; call report to [phone]. Patient awaiting transport to the facility via sheriff.

## 2014-07-11 NOTE — ED Notes (Signed)
Patient ambulatory to and from restroom at this time. [place] escorting patient.

## 2014-07-11 NOTE — ED Notes (Signed)
Patient ambulatory to shower room, escorted by female tech and [place] Police to get clean. New paper scrubs provided, new socks, tooth brush, lotion, and full linen change and bedside cleaning completed in pt room.

## 2014-07-11 NOTE — ED Notes (Signed)
Pt ambulated to restroom & returned to room w/ no complications. [employer] remains w/ pt.

## 2014-07-11 NOTE — ED Notes (Signed)
Patient resting in bed in a position of comfort. Eyes closed, even rise and fall of chest.

## 2014-07-11 NOTE — ED Notes (Signed)
Patient ambulatory to and from restroom at this time. [place] Police to escort patient. Patient mumbling at this time unable to detect english words, just noise. Patient will break mumbling and answer questions when asked.

## 2014-07-11 NOTE — ED Notes (Signed)
Attempted to call report to [facility] x2 without success. Staff stated they would call back "in a few minutes".

## 2014-07-11 NOTE — ED Notes (Signed)
Pt refusing vital signs

## 2014-07-19 ENCOUNTER — Encounter (HOSPITAL_COMMUNITY): Payer: Self-pay | Admitting: Behavioral Health

## 2014-07-19 ENCOUNTER — Emergency Department (HOSPITAL_COMMUNITY)
Admission: EM | Admit: 2014-07-19 | Discharge: 2014-07-19 | Disposition: A | Discharge status: Other Acute Inpatient Hospital | Payer: BC Managed Care – PPO | Attending: Emergency Medicine | Admitting: Emergency Medicine

## 2014-07-19 ENCOUNTER — Inpatient Hospital Stay (HOSPITAL_COMMUNITY)
Admission: AD | Admit: 2014-07-19 | Discharge: 2014-07-26 | DRG: 885 | Disposition: A | Discharge status: Home / Self Care | Payer: BC Managed Care – PPO | Source: Intra-hospital | Attending: Psychiatry | Admitting: Psychiatry

## 2014-07-19 ENCOUNTER — Encounter (HOSPITAL_COMMUNITY): Payer: Self-pay | Admitting: Emergency Medicine

## 2014-07-19 DIAGNOSIS — Z86718 Personal history of other venous thrombosis and embolism: Secondary | ICD-10-CM | POA: Insufficient documentation

## 2014-07-19 DIAGNOSIS — L259 Unspecified contact dermatitis, unspecified cause: Secondary | ICD-10-CM

## 2014-07-19 DIAGNOSIS — Z9089 Acquired absence of other organs: Secondary | ICD-10-CM | POA: Insufficient documentation

## 2014-07-19 DIAGNOSIS — N760 Acute vaginitis: Secondary | ICD-10-CM

## 2014-07-19 DIAGNOSIS — Z833 Family history of diabetes mellitus: Secondary | ICD-10-CM

## 2014-07-19 DIAGNOSIS — Z79899 Other long term (current) drug therapy: Secondary | ICD-10-CM | POA: Insufficient documentation

## 2014-07-19 DIAGNOSIS — R760 Raised antibody titer: Secondary | ICD-10-CM

## 2014-07-19 DIAGNOSIS — Z5987 Material hardship due to limited financial resources, not elsewhere classified: Secondary | ICD-10-CM

## 2014-07-19 DIAGNOSIS — I10 Essential (primary) hypertension: Secondary | ICD-10-CM | POA: Insufficient documentation

## 2014-07-19 DIAGNOSIS — D649 Anemia, unspecified: Secondary | ICD-10-CM | POA: Insufficient documentation

## 2014-07-19 DIAGNOSIS — Z800 Family history of malignant neoplasm of digestive organs: Secondary | ICD-10-CM

## 2014-07-19 DIAGNOSIS — D6859 Other primary thrombophilia: Secondary | ICD-10-CM | POA: Insufficient documentation

## 2014-07-19 DIAGNOSIS — R12 Heartburn: Secondary | ICD-10-CM

## 2014-07-19 DIAGNOSIS — Z008 Encounter for other general examination: Secondary | ICD-10-CM | POA: Insufficient documentation

## 2014-07-19 DIAGNOSIS — D34 Benign neoplasm of thyroid gland: Secondary | ICD-10-CM

## 2014-07-19 DIAGNOSIS — E039 Hypothyroidism, unspecified: Secondary | ICD-10-CM | POA: Diagnosis present

## 2014-07-19 DIAGNOSIS — E079 Disorder of thyroid, unspecified: Secondary | ICD-10-CM | POA: Insufficient documentation

## 2014-07-19 DIAGNOSIS — Z8589 Personal history of malignant neoplasm of other organs and systems: Secondary | ICD-10-CM | POA: Insufficient documentation

## 2014-07-19 DIAGNOSIS — F200 Paranoid schizophrenia: Principal | ICD-10-CM | POA: Diagnosis present

## 2014-07-19 DIAGNOSIS — Z8249 Family history of ischemic heart disease and other diseases of the circulatory system: Secondary | ICD-10-CM

## 2014-07-19 DIAGNOSIS — Z598 Other problems related to housing and economic circumstances: Secondary | ICD-10-CM

## 2014-07-19 DIAGNOSIS — F410 Panic disorder [episodic paroxysmal anxiety] without agoraphobia: Secondary | ICD-10-CM | POA: Diagnosis present

## 2014-07-19 DIAGNOSIS — Z7901 Long term (current) use of anticoagulants: Secondary | ICD-10-CM | POA: Insufficient documentation

## 2014-07-19 DIAGNOSIS — IMO0002 Reserved for concepts with insufficient information to code with codable children: Secondary | ICD-10-CM | POA: Insufficient documentation

## 2014-07-19 DIAGNOSIS — Z5900 Homelessness unspecified: Secondary | ICD-10-CM

## 2014-07-19 DIAGNOSIS — F29 Unspecified psychosis not due to a substance or known physiological condition: Secondary | ICD-10-CM

## 2014-07-19 DIAGNOSIS — F22 Delusional disorders: Secondary | ICD-10-CM | POA: Diagnosis present

## 2014-07-19 DIAGNOSIS — K219 Gastro-esophageal reflux disease without esophagitis: Secondary | ICD-10-CM | POA: Diagnosis present

## 2014-07-19 DIAGNOSIS — Z8742 Personal history of other diseases of the female genital tract: Secondary | ICD-10-CM | POA: Insufficient documentation

## 2014-07-19 LAB — URINE MICROSCOPIC-ADD ON

## 2014-07-19 LAB — CBC
HEMATOCRIT: 39.4 % (ref 36.0–46.0)
Hemoglobin: 13.1 g/dL (ref 12.0–15.0)
MCH: 28.4 pg (ref 26.0–34.0)
MCHC: 33.2 g/dL (ref 30.0–36.0)
MCV: 85.5 fL (ref 78.0–100.0)
Platelets: 202 10*3/uL (ref 150–400)
RBC: 4.61 MIL/uL (ref 3.87–5.11)
RDW: 14.8 % (ref 11.5–15.5)
WBC: 6.8 10*3/uL (ref 4.0–10.5)

## 2014-07-19 LAB — URINALYSIS, ROUTINE W REFLEX MICROSCOPIC
Bilirubin Urine: NEGATIVE
GLUCOSE, UA: NEGATIVE mg/dL
KETONES UR: NEGATIVE mg/dL
LEUKOCYTES UA: NEGATIVE
Nitrite: NEGATIVE
Protein, ur: NEGATIVE mg/dL
SPECIFIC GRAVITY, URINE: 1.015 (ref 1.005–1.030)
Urobilinogen, UA: 0.2 mg/dL (ref 0.0–1.0)
pH: 7 (ref 5.0–8.0)

## 2014-07-19 LAB — COMPREHENSIVE METABOLIC PANEL
ALBUMIN: 3.8 g/dL (ref 3.5–5.2)
ALT: 43 U/L — ABNORMAL HIGH (ref 0–35)
ANION GAP: 12 (ref 5–15)
AST: 23 U/L (ref 0–37)
Alkaline Phosphatase: 84 U/L (ref 39–117)
BUN: 23 mg/dL (ref 6–23)
CO2: 28 mEq/L (ref 19–32)
Calcium: 10.2 mg/dL (ref 8.4–10.5)
Chloride: 100 mEq/L (ref 96–112)
Creatinine, Ser: 1.26 mg/dL — ABNORMAL HIGH (ref 0.50–1.10)
GFR calc Af Amer: 59 mL/min — ABNORMAL LOW (ref >90)
GFR calc non Af Amer: 51 mL/min — ABNORMAL LOW (ref >90)
Glucose, Bld: 102 mg/dL — ABNORMAL HIGH (ref 70–99)
Potassium: 3.8 mEq/L (ref 3.7–5.3)
Sodium: 140 mEq/L (ref 137–147)
TOTAL PROTEIN: 7.5 g/dL (ref 6.0–8.3)
Total Bilirubin: 0.2 mg/dL — ABNORMAL LOW (ref 0.3–1.2)

## 2014-07-19 LAB — ETHANOL: Alcohol, Ethyl (B): <11 mg/dL (ref 0–11)

## 2014-07-19 LAB — RAPID URINE DRUG SCREEN, HOSP PERFORMED
Amphetamines: NOT DETECTED
BENZODIAZEPINES: NOT DETECTED
Barbiturates: NOT DETECTED
Cocaine: NOT DETECTED
Opiates: NOT DETECTED
Tetrahydrocannabinol: NOT DETECTED

## 2014-07-19 MED ORDER — MAGNESIUM HYDROXIDE 400 MG/5ML PO SUSP: 30.0000 mL | Freq: Every day | ORAL | Status: DC | PRN

## 2014-07-19 MED ORDER — ALUM & MAG HYDROXIDE-SIMETH 200-200-20 MG/5ML PO SUSP: 30.0000 mL | ORAL | Status: DC | PRN

## 2014-07-19 MED ORDER — ACETAMINOPHEN 325 MG PO TABS
650.0000 mg | ORAL_TABLET | Freq: Four times a day (QID) | ORAL | Status: DC | PRN
  Administered 2014-07-20: 650 mg via ORAL
  Filled 2014-07-19: qty 2
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
## 2014-07-19 NOTE — ED Notes (Signed)
Patient informed of pending transfer to [facility].

## 2014-07-19 NOTE — BHH Counselor (Signed)
Asked for TA equipment to be set up.   Spoke with Dr. Sabra Heck who reports Pt was brought in by police because she was wondering the streets and appeared dellusional. Pt was discharged from [facility] today, and reports she was dropped in her home city without medications, and no place to go. Dr. Sabra Heck sts pt does not appear to be a threat to herself or others but does appear to need care for acute psychosis. Police told Dr. Sabra Heck Pt could return there once being checked out by local hospital.   [place]right to see if the would fax records. Per Roderic Palau, RN medical records is closed but he could allow me to leave a vm to request information for first thing in the AM. Roderic Palau, RN reports Pt would have been d/c with prescription but not actual medication. Roderic Palau reports OV would look at Pt's paperwork and the doctor may be willing to readmit her as she had not been seen at [place] prior to most recent episode.   Left msg requesting records from [place].   TA to commence shortly.   Lear Ng, [facility] Triage Specialist 07/19/2014 4:17 AM

## 2014-07-19 NOTE — Tx Team (Signed)
Initial Interdisciplinary Treatment Plan  PATIENT STRENGTHS: (choose at least two) Ability for insight Capable of independent living  PATIENT STRESSORS: Financial difficulties Health problems Loss of husband   PROBLEM LIST: Problem List/Patient Goals Date to be addressed Date deferred Reason deferred Estimated date of resolution  Homeless  07/19/14     Poor sleep  07/19/14     Religiously preoccupied  07/19/14                                          DISCHARGE CRITERIA:  Ability to meet basic life and health needs Adequate post-discharge living arrangements Improved stabilization in mood, thinking, and/or behavior Verbal commitment to aftercare and medication compliance  PRELIMINARY DISCHARGE PLAN: Attend aftercare/continuing care group Attend PHP/IOP  PATIENT/FAMIILY INVOLVEMENT: This treatment plan has been presented to and reviewed with the patient, Gillermina Hu, and/or family member.  The patient and family have been given the opportunity to ask questions and make suggestions.  Evian Salguero L 07/19/2014, 6:12 PM

## 2014-07-19 NOTE — ED Notes (Signed)
Patient left ED at this time with [facility] for transport to [facility]. No distress upon departure.

## 2014-07-19 NOTE — ED Notes (Signed)
Pt brought in by [facility] police after pt was found walking in the roadway.  When officers stopped her, she told them that she was a "prophet from [place]"   Pt had apparently walked from [place] to [place].

## 2014-07-19 NOTE — Progress Notes (Signed)
Adult Psychoeducational Group Note  Date:  07/19/2014 Time:  9:28 PM  Group Topic/Focus:  Wrap-Up Group:   The focus of this group is to help patients review their daily goal of treatment and discuss progress on daily workbooks.  Participation Level:  Did Not Attend  Participation Quality:  Did not attend  Affect:  Did not attend  Cognitive:  Did not attend  Insight: Lacking  Engagement in Group:  Did not attend  Modes of Intervention:  Socialization and Support  Additional Comments:  Patient did not attend   Debe Coder 07/19/2014, 9:28 PM
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
## 2014-07-19 NOTE — ED Notes (Signed)
Report given to Sharl Ma, RN behavioral Health.

## 2014-07-19 NOTE — Progress Notes (Signed)
D: Pt is currently denying any SI/HI/AVH. Pt is declining this writer's offer to attend group at this time. Pt reports that she is feeling tired due to a lack of sleep. She plans on staying up to read her bible for the time being. She is denying any concerns she wishes for this writer to address at this time.  A: Continued support and availability as needed was extended to this pt. Staff continue to monitor pt with q64min checks.  R: No adverse drug reactions noted. Pt receptive to treatment. Pt remains safe at this time.

## 2014-07-19 NOTE — ED Provider Notes (Signed)
CSN: [id number]     Arrival date & time 07/19/14  0115 History   First MD Initiated Contact with Patient 07/19/14 0139     Chief Complaint  Patient presents with  . V70.1     (Consider location/radiation/quality/duration/timing/severity/associated sxs/prior Treatment) HPI Comments: The patient is a 43 year old female who presents to the hospital by police transport after she was found walking on the roadway, when the officers stopped the patient she reported that she was a profit from God. The patient was apparently discharged from old [facility] facility earlier in the day and transported to an apartment complex  near where she was found. The patient states that she is only here because she is homeless, she arrives in handcuffs with involuntary commitment papers after the police officer placed her under commitment. The patient denies any homicidal or suicidal thoughts and the police officer corroborates that she has not had any threatening behavior or threatening language. According to the medical record the patient had a new onset psychosis which was present in the last 2 weeks, spent several days in the emergency department before she was transferred to a psychiatric hospital. The patient denies any medications were prescribed and states that other than her thyroid medication and her blood pressure medication she was not given any prescriptions for psychiatric medications. The patient had apparently called the police administrator and wanted to talk to his wife asking if she could stay at their house as she was homeless. Her discussion with the administrator was such that she told him that the sheriff was dead and that her husband was bisexual (had been part of prior conversations in prior psychiatric evaluations).  She claimed to be prophesied multiple things about the future and claiming infidelity of the police administrator when talking to his wife. She presents complaint with the police  officers requested him to the hospital   The history is provided by the patient, medical records and the police.    Past Medical History  Diagnosis Date  . Thyroid disease   . Fibroid tumor   . DVT (deep venous thrombosis) 09/30/12    in upper extremities b/l and LLE  . Anemia   . Thyroid nodule     s/p biopsy  . Graves disease   . Multinodular goiter 10/01/2012  . Lupus anticoagulant positive 10/02/2012  . Hypertension   . 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 Arnoldo Morale on 12/13/2012.  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 Supracervical abdominal hysterectomy  09/27/2012    Procedure: HYSTERECTOMY SUPRACERVICAL ABDOMINAL;  Surgeon: Jonnie Kind, MD;  Location: [facility];  Service: Gynecology;  Laterality: N/A;  . Hematoma evacuation  10/03/2012    Procedure: EVACUATION HEMATOMA;  Surgeon: Jonnie Kind, MD;  Location: [facility];  Service: Gynecology;  Laterality: N/A;  . Thyroidectomy  12/13/2012    Procedure: THYROIDECTOMY;  Surgeon: Jamesetta So, MD;  Location: [facility];  Service: General;  Laterality: Bilateral;   Family History  Problem Relation Age of Onset  . Diabetes Mother   . Hypertension Mother   . Cancer Father     prostate   . Cancer Sister     pancreatic cancer   History  Substance Use Topics  . Smoking status: Never Smoker   . Smokeless tobacco: Never Used  . Alcohol Use: No   OB History   Grav Para Term  Preterm Abortions TAB SAB Ect Mult Living                 Review of Systems  All other systems reviewed and are negative.     Allergies  Review of patient's allergies indicates no known allergies.  Home Medications   Prior to Admission medications   Medication Sig Start Date End Date Taking? Authorizing Provider  amLODipine (NORVASC) 5 MG tablet TAKE 1 TABLET BY MOUTH EVERY DAY 07/07/14   Orlena Sheldon, PA-C  calcium-vitamin D (OSCAL 500/200  D-3) 500-200 MG-UNIT per tablet Take 1 tablet by mouth 2 (two) times daily. 12/14/12   Jamesetta So, MD  ferrous sulfate 325 (65 FE) MG tablet Take 325 mg by mouth daily. 10/07/12   Kathie Dike, MD  fluticasone (FLONASE) 50 MCG/ACT nasal spray Place 1 spray into the nose 2 (two) times daily. 01/17/13 01/17/14  Alycia Rossetti, MD  HYDROcodone-acetaminophen (NORCO) 5-325 MG per tablet Take 1-2 tablets by mouth every 4 (four) hours as needed for pain. 12/14/12   Jamesetta So, MD  labetalol (NORMODYNE) 200 MG tablet Take 200 mg by mouth 2 (two) times daily.    Historical Provider, MD  levothyroxine (SYNTHROID) 125 MCG tablet Take 1 tablet (125 mcg total) by mouth daily. 12/14/12   Jamesetta So, MD  ranitidine (ZANTAC) 150 MG tablet Take 1 tablet (150 mg total) by mouth at bedtime. 12/20/12 12/20/13  Alycia Rossetti, MD  warfarin (COUMADIN) 5 MG tablet Take 1 tablet (5 mg total) by mouth daily. Taking 7.5 mg on Mondays and Thursdays and 5 mg all other days. 12/20/12   Alycia Rossetti, MD  zolpidem (AMBIEN) 5 MG tablet Take 5 mg by mouth at bedtime as needed. Sleep.    Historical Provider, MD   BP 130/82  Pulse 101  Temp(Src) 98.3 F (36.8 C) (Oral)  Resp 18  Ht 5\' 9"  (1.753 m)  Wt 279 lb (126.554 kg)  BMI 41.18 kg/m2  SpO2 98%  LMP 09/18/2012 Physical Exam  Nursing note and vitals reviewed. Constitutional: She appears well-developed and well-nourished. No distress.  HENT:  Head: Normocephalic and atraumatic.  Mouth/Throat: Oropharynx is clear and moist. No oropharyngeal exudate.  Eyes: Conjunctivae and EOM are normal. Pupils are equal, round, and reactive to light. Right eye exhibits no discharge. Left eye exhibits no discharge. No scleral icterus.  Neck: Normal range of motion. Neck supple. No JVD present. No thyromegaly present.  Cardiovascular: Normal rate, regular rhythm, normal heart sounds and intact distal pulses.  Exam reveals no gallop and no friction rub.   No murmur  heard. Pulmonary/Chest: Effort normal and breath sounds normal. No respiratory distress. She has no wheezes. She has no rales.  Abdominal: Soft. Bowel sounds are normal. She exhibits no distension and no mass. There is no tenderness.  Musculoskeletal: Normal range of motion. She exhibits no edema and no tenderness.  Lymphadenopathy:    She has no cervical adenopathy.  Neurological: She is alert. Coordination normal.  Skin: Skin is warm and dry. No rash noted. No erythema.  Psychiatric:  Flat affect, delusional regarding her ability to tell the future as well prothetic ability to preach what God has told her to tell people. She denies hallucinating, denies suicidal or homicidal thoughts    ED Course  Procedures (including critical care time) Labs Review Labs Reviewed  COMPREHENSIVE METABOLIC PANEL - Abnormal; Notable for the following:    Glucose, Bld 102 (*)    Creatinine,  Ser 1.26 (*)    ALT 43 (*)    Total Bilirubin 0.2 (*)    GFR calc non Af Amer 51 (*)    GFR calc Af Amer 59 (*)    All other components within normal limits  URINALYSIS, ROUTINE W REFLEX MICROSCOPIC - Abnormal; Notable for the following:    Color, Urine STRAW (*)    Hgb urine dipstick SMALL (*)    All other components within normal limits  URINE MICROSCOPIC-ADD ON - Abnormal; Notable for the following:    Squamous Epithelial / LPF MANY (*)    Bacteria, UA MANY (*)    All other components within normal limits  CBC  ETHANOL  URINE RAPID DRUG SCREEN (HOSP PERFORMED)    Imaging Review No results found.    MDM   Final diagnoses:  None    The patient is clearly delusional, when she talks about the sheriff she states that he is dead, when I tell her that she is incorrect in that he is alive she says "well he will be dead soon" she does not have any self threatening words or behavior, she apparently is still in a psychosis but she does not appear dangerous to herself or others at this time.  According to the  police administrator, he contacted [facility] who said that they would take her back after she was cleared and evaluated at a local hospital.  D/w [facility] assessment team member Izora Gala - agrees she meets inpt criteria - will work on placement at [facility] today  Change of shift - care signed out to oncoming EDP  Johnna Acosta, MD 07/19/14 [phone]

## 2014-07-19 NOTE — [facility] Counselor (Signed)
Relayed results of TA to Patriciaann Clan, [place]. Per Frederico Hamman, Pt meets inpt criteria due to psychosis. Pt could be admitted to [facility] if an appropriate bed becomes available. TTS to seek placement elsewhere in the interim. Pt does not want to be referred back to [facility], and she felt it was not helpful, and she was not treated well.   Spoke with EDP Dr. Sabra Heck who is in agreement to seek placement and attempt to connect Pt with social work, and possibly an ACT team.   Lear Ng, [facility] Triage Specialist 07/19/2014 5:01 AM

## 2014-07-19 NOTE — ED Notes (Signed)
Ambulatory to restroom with steady gait.   Awaiting pharmacy tech to verify home medications. Once completed, will notify MD for orders.

## 2014-07-20 DIAGNOSIS — F200 Paranoid schizophrenia: Secondary | ICD-10-CM | POA: Diagnosis present

## 2014-07-20 DIAGNOSIS — F22 Delusional disorders: Secondary | ICD-10-CM

## 2014-07-20 LAB — PROTIME-INR
INR: 1.36 (ref 0.00–1.49)
PROTHROMBIN TIME: 16.8 s — AB (ref 11.6–15.2)

## 2014-07-20 MED ORDER — BENZTROPINE MESYLATE 0.5 MG PO TABS
0.5000 mg | ORAL_TABLET | Freq: Two times a day (BID) | ORAL | Status: DC
  Administered 2014-07-20 – 2014-07-26 (×12): 0.5 mg via ORAL
  Filled 2014-07-20 (×14): qty 1

## 2014-07-20 MED ORDER — NAPROXEN 500 MG PO TABS
500.0000 mg | ORAL_TABLET | Freq: Two times a day (BID) | ORAL | Status: DC
  Administered 2014-07-20 – 2014-07-26 (×12): 500 mg via ORAL
  Filled 2014-07-20 (×14): qty 1

## 2014-07-20 MED ORDER — AMLODIPINE BESYLATE 5 MG PO TABS
5.0000 mg | ORAL_TABLET | Freq: Every day | ORAL | Status: DC
  Administered 2014-07-20 – 2014-07-26 (×7): 5 mg via ORAL
  Filled 2014-07-20 (×8): qty 1

## 2014-07-20 MED ORDER — FERROUS SULFATE 325 (65 FE) MG PO TABS
325.0000 mg | ORAL_TABLET | Freq: Every day | ORAL | Status: DC
  Administered 2014-07-20 – 2014-07-26 (×7): 325 mg via ORAL
  Filled 2014-07-20 (×8): qty 1

## 2014-07-20 MED ORDER — FAMOTIDINE 20 MG PO TABS
20.0000 mg | ORAL_TABLET | Freq: Every day | ORAL | Status: DC
  Administered 2014-07-20 – 2014-07-26 (×7): 20 mg via ORAL
  Filled 2014-07-20 (×8): qty 1

## 2014-07-20 MED ORDER — POLYVINYL ALCOHOL 1.4 % OP SOLN
1.0000 [drp] | Freq: Two times a day (BID) | OPHTHALMIC | Status: DC
  Administered 2014-07-20 – 2014-07-24 (×3): 1 [drp] via OPHTHALMIC
  Filled 2014-07-20: qty 15

## 2014-07-20 MED ORDER — WARFARIN SODIUM 5 MG PO TABS: 5.0000 mg | ORAL_TABLET | Freq: Every day | ORAL | Status: DC

## 2014-07-20 MED ORDER — TRAZODONE HCL 100 MG PO TABS
100.0000 mg | ORAL_TABLET | Freq: Every evening | ORAL | Status: DC | PRN
  Administered 2014-07-22 – 2014-07-25 (×3): 100 mg via ORAL
  Filled 2014-07-20: qty 1
  Filled 2014-07-20: qty 14
  Filled 2014-07-20 (×2): qty 1

## 2014-07-20 MED ORDER — LABETALOL HCL 200 MG PO TABS
200.0000 mg | ORAL_TABLET | Freq: Two times a day (BID) | ORAL | Status: DC
  Administered 2014-07-20 – 2014-07-26 (×12): 200 mg via ORAL
  Filled 2014-07-20 (×14): qty 1

## 2014-07-20 MED ORDER — WARFARIN SODIUM 5 MG PO TABS
10.0000 mg | ORAL_TABLET | Freq: Once | ORAL | Status: AC
  Administered 2014-07-20: 10 mg via ORAL
  Filled 2014-07-20 (×2): qty 2

## 2014-07-20 MED ORDER — HALOPERIDOL 2 MG PO TABS
2.0000 mg | ORAL_TABLET | Freq: Two times a day (BID) | ORAL | Status: DC
  Filled 2014-07-20 (×2): qty 1

## 2014-07-20 MED ORDER — RISPERIDONE 2 MG PO TABS
2.0000 mg | ORAL_TABLET | Freq: Two times a day (BID) | ORAL | Status: DC
  Administered 2014-07-20 – 2014-07-26 (×12): 2 mg via ORAL
  Filled 2014-07-20 (×15): qty 1

## 2014-07-20 MED ORDER — WARFARIN - PHARMACIST DOSING INPATIENT
Freq: Every day | Status: DC
Start: 2014-07-21 — End: 2014-07-26
  Administered 2014-07-21 – 2014-07-25 (×3)
  Filled 2014-07-20 (×9): qty 1

## 2014-07-20 MED ORDER — LEVOTHYROXINE SODIUM 125 MCG PO TABS
125.0000 ug | ORAL_TABLET | Freq: Every day | ORAL | Status: DC
Start: 2014-07-20 — End: 2014-07-26
  Administered 2014-07-20 – 2014-07-26 (×7): 125 ug via ORAL
  Filled 2014-07-20 (×8): qty 1

## 2014-07-20 MED ORDER — CALCIUM CARBONATE ANTACID 500 MG PO CHEW
1.0000 | CHEWABLE_TABLET | Freq: Two times a day (BID) | ORAL | Status: DC
  Administered 2014-07-20 – 2014-07-26 (×12): 200 mg via ORAL
  Filled 2014-07-20 (×15): qty 1

## 2014-07-20 NOTE — Progress Notes (Addendum)
Patient stated she has not slept in over 60 hours, needs sleep medication.  Does not have any clothes.  Denied SI and HI, contracts for safety.  Denied A/V hallucinations.  Denied pain.  Patient stated she is homeless.  "I'm not crazy."  Stated her mother died in 04-09-08, sister died, has no one.  Husband bisexual and she left him 07/12/2014.  Stated she is not suicidal, that she is a prophet, that God talked to her.  Stated her husband works in [place], then stated her husband had died.  No children.  This is her first marriage.  Stated she was in the [employer], honorable discharge.  Has disability, GI bill, never received any payments.  Denied using any drugs, NKA, needs sleep medications.   Stated she asked her dad for help and he refused to give her money for place to stay.    Patient given tylenol for leg pain #9 this morning.     D:  Patient's self inventory sheet, patient did not sleep well last night, needed sleep medication.  Poor appetite, low energy level, poor concentration.  Rated depression 5, hopeless 3, anxiety 5.  Denied withdrawals.  Denied SI.  Denied physical problems. A:  Medications administered per MD orders.  Emotional support and encouragement given patient. R:  Denied SI and HI.  Denied A/V hallucinations.  Safety maintained with 15 minute checks.

## 2014-07-20 NOTE — [facility] Suicide Risk Assessment (Signed)
   Nursing information obtained from:  Patient Demographic factors:  Divorced or widowed;Low socioeconomic status;Living alone;Unemployed Current Mental Status:  NA Loss Factors:  Financial problems / change in socioeconomic status Historical Factors:  Family history of mental illness or substance abuse Risk Reduction Factors:  Sense of responsibility to family;Religious beliefs about death;Positive coping skills or problem solving skills Total Time spent with patient: 30 minutes  CLINICAL FACTORS:   Severe Anxiety and/or Agitation Schizophrenia:   Paranoid or undifferentiated type Currently Psychotic  Psychiatric Specialty Exam: Physical Exam  Psychiatric: Her affect is angry and labile. Her speech is rapid and/or pressured and tangential. She is agitated, aggressive, actively hallucinating and combative. Thought content is paranoid and delusional. Cognition and memory are normal. She expresses impulsivity.    Review of Systems  Constitutional: Negative.   HENT: Negative.   Eyes: Negative.   Respiratory: Negative.   Cardiovascular: Negative.   Gastrointestinal: Negative.   Genitourinary: Negative.   Musculoskeletal: Positive for myalgias.  Skin: Negative.   Neurological: Negative.   Endo/Heme/Allergies: Negative.   Psychiatric/Behavioral: Positive for hallucinations. The patient is nervous/anxious and has insomnia.     Blood pressure 126/102, pulse 90, temperature 98.3 F (36.8 C), temperature source Oral, resp. rate 18, height 5' 7.75" (1.721 m), weight 131.543 kg (290 lb), last menstrual period 09/18/2012.Body mass index is 44.41 kg/(m^2).  General Appearance: Disheveled  Eye Contact::  Minimal  Speech:  Pressured  Volume:  Increased  Mood:  Angry and Irritable  Affect:  Labile  Thought Process:  Circumstantial and Disorganized  Orientation:  Full (Time, Place, and Person)  Thought Content:  Delusions and Paranoid Ideation  Suicidal Thoughts:  No  Homicidal Thoughts:   No  Memory:  Immediate;   Fair Recent;   Fair Remote;   Fair  Judgement:  Impaired  Insight:  Lacking  Psychomotor Activity:  Increased  Concentration:  Fair  Recall:  [employer] of Knowledge:Fair  Language: Good  Akathisia:  No  Handed:  Right  AIMS (if indicated):     Assets:  Communication Skills  Sleep:  Number of Hours: 0.25   Musculoskeletal: Strength & Muscle Tone: within normal limits Gait & Station: normal Patient leans: N/A  COGNITIVE FEATURES THAT CONTRIBUTE TO RISK:  Closed-mindedness Polarized thinking    SUICIDE RISK:   Minimal: No identifiable suicidal ideation.  Patients presenting with no risk factors but with morbid ruminations; may be classified as minimal risk based on the severity of the depressive symptoms  PLAN OF CARE:1. Admit for crisis management and stabilization. 2. Medication management to reduce current symptoms to base line and improve the     patient's overall level of functioning 3. Treat health problems as indicated. 4. Develop treatment plan to decrease risk of relapse upon discharge and the need for     readmission. 5. Psycho-social education regarding relapse prevention and self care. 6. Health care follow up as needed for medical problems. 7. Restart home medications where appropriate.  I certify that inpatient services furnished can reasonably be expected to improve the patient's condition.  Corena Pilgrim, MD 07/20/2014, 11:25 AM

## 2014-07-20 NOTE — H&P (Signed)
Psychiatric Admission Assessment Adult  Patient Identification:  [name] Date of Evaluation:  07/20/2014 Chief Complaint:  "I receive messages for the Lord."  History of Present Illness::  [name] is a 43 year old female who was brought to [facility] via law enforcement due to walking the streets and claiming to be a prophet. She was recently discharged from [facility] on 07/18/14. The patient made comments about being able to receive messages from God on a daily basis. Patient states today during her psychiatric admission assessment "I don't understand why I am here. Why am I in a psychiatric hospital? I am homeless. I'm not crazy. I don't need any help or medications. I'm not delusional or Bipolar. I could not live with my father so I just started walking. I found out my husband was bi-sexual. There is nothing wrong with me. I was not harassing anyone. I just live for the [place]. I did say things about being a messenger." Her story regarding her husband was not consistent. Patient reports "hearing" from someone that he died. Then patient proceeds to provide treatment team with contact information for his place of employment. The patient angry about people allowing her to marry her husband due to him being "bisexual" and about being in the hospital. She denies that [place] gave her any medications but Risperdal is listed on the prior to admission form.   Elements:  Location:  Adult in-patient for psychosis . Quality:  Delusions, Agitation . Severity:  Severe . Timing:  Last few days . Duration:  unknown . Context:  Disorganized behaviors, aggression, psychotic symptoms . Associated Signs/Synptoms: Depression Symptoms:  psychomotor agitation, panic attacks, (Hypo) Manic Symptoms:  Denies Anxiety Symptoms: Denies Psychotic Symptoms:  Delusions, Paranoia, PTSD Symptoms: Negative Total Time spent with patient: 1 hour  Psychiatric Specialty Exam: Physical Exam  Constitutional:   Physical exam findings reviewed from the [facility] and I concur with no noted exceptions.   Psychiatric: Her affect is angry and labile. Her speech is rapid and/or pressured. She is agitated, aggressive and actively hallucinating. Thought content is paranoid and delusional. She expresses impulsivity.    Review of Systems  Constitutional: Negative.   HENT: Negative.   Eyes: Negative.   Respiratory: Negative.   Cardiovascular: Negative.   Gastrointestinal: Negative.   Genitourinary: Negative.   Musculoskeletal: Negative.   Skin: Negative.   Neurological: Negative.   Endo/Heme/Allergies: Negative.   Psychiatric/Behavioral: Positive for depression. The patient is nervous/anxious and has insomnia.     Blood pressure 150/97, pulse 89, temperature 98.5 F (36.9 C), temperature source Oral, resp. rate 18, height 5' 7.75" (1.721 m), weight 131.543 kg (290 lb), last menstrual period 09/18/2012.Body mass index is 44.41 kg/(m^2).  General Appearance: Disheveled  Eye Contact::  Minimal  Speech:  Pressured  Volume:  Increased  Mood:  Angry and Irritable  Affect:  Labile  Thought Process:  Circumstantial and Disorganized  Orientation:  Full (Time, Place, and Person)  Thought Content:  Delusions and Paranoid Ideation  Suicidal Thoughts:  No  Homicidal Thoughts:  No  Memory:  Immediate;   Fair Recent;   Fair Remote;   Fair  Judgement:  Impaired  Insight:  Lacking  Psychomotor Activity:  Increased  Concentration:  Fair  Recall:  [employer] of Knowledge:Fair  Language: Good  Akathisia:  No  Handed:  Right  AIMS (if indicated):     Assets:  Communication Skills Leisure Time Resilience  Sleep:  Number of Hours: 0.25  Musculoskeletal: Strength & Muscle Tone: within normal limits Gait & Station: normal Patient leans: N/A  Past Psychiatric History: Diagnosis:  Hospitalizations: [facility]   Outpatient Care:Denies  Substance Abuse Care:  Self-Mutilation:Denies  Suicidal Attempts:  Denies  Violent Behaviors: Potential due to psychosis    Past Medical History:   Past Medical History  Diagnosis Date  . Thyroid disease   . Fibroid tumor   . DVT (deep venous thrombosis) 09/30/12    in upper extremities b/l and LLE  . Anemia   . Thyroid nodule     s/p biopsy  . Graves disease   . Multinodular goiter 10/01/2012  . Lupus anticoagulant positive 10/02/2012  . Hypertension   . 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 Arnoldo Morale on 12/13/2012.  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 Allergies:  No Known Allergies PTA Medications: Prescriptions prior to admission  Medication Sig Dispense Refill  . amLODipine (NORVASC) 5 MG tablet TAKE 1 TABLET BY MOUTH EVERY DAY  90 tablet  0  . calcium carbonate (TUMS - DOSED IN MG ELEMENTAL CALCIUM) 500 MG chewable tablet Chew 1 tablet by mouth 2 (two) times daily.      . ferrous sulfate 325 (65 FE) MG tablet Take 325 mg by mouth daily.      Marland Kitchen labetalol (NORMODYNE) 200 MG tablet Take 200 mg by mouth 2 (two) times daily.      Marland Kitchen levothyroxine (SYNTHROID) 125 MCG tablet Take 1 tablet (125 mcg total) by mouth daily.  90 tablet  0  . Polyvinyl Alcohol (LUBRICANT DROPS OP) Place 1 drop into both eyes 2 (two) times daily.      . ranitidine (ZANTAC) 150 MG tablet Take 1 tablet (150 mg total) by mouth at bedtime.  30 tablet  3  . ranitidine (ZANTAC) 150 MG tablet Take 150 mg by mouth at bedtime.      . risperiDONE (RISPERDAL) 2 MG tablet Take 2 mg by mouth 2 (two) times daily.      Marland Kitchen warfarin (COUMADIN) 5 MG tablet Take 5-7.5 mg by mouth daily. Take 11m on Tuesday, Wednesday, Friday, Saturday, and Sunday. Take 7.5 mg on Monday and Thursday.        Previous Psychotropic Medications:  Medication/Dose  Risperdal 2 mg BID                Substance Abuse History in the last 12 months:  No.  Consequences of Substance Abuse: NA  Social History:  reports that she has never smoked. She  has never used smokeless tobacco. She reports that she does not drink alcohol or use illicit drugs. Additional Social History:                      Current Place of Residence:   Place of Birth:   Family Members: Marital Status:  Divorced Children:  Sons:  Daughters: Relationships: Education:  CDentistProblems/Performance: Religious Beliefs/Practices: History of Abuse (Emotional/Phsycial/Sexual) OPensions consultant MNature conservation officerHistory:  CTree surgeonHistory: Hobbies/Interests:  Family History:   Family History  Problem Relation Age of Onset  . Diabetes Mother   . Hypertension Mother   . Cancer Father     prostate   . Cancer Sister     pancreatic cancer    Results for orders placed during the hospital encounter of 07/19/14 (from the past 72 hour(s))  CBC     Status: None  Collection Time    07/19/14  1:58 AM      Result Value Ref Range   WBC 6.8  4.0 - 10.5 K/uL   RBC 4.61  3.87 - 5.11 MIL/uL   Hemoglobin 13.1  12.0 - 15.0 g/dL   HCT 39.4  36.0 - 46.0 %   MCV 85.5  78.0 - 100.0 fL   MCH 28.4  26.0 - 34.0 pg   MCHC 33.2  30.0 - 36.0 g/dL   RDW 14.8  11.5 - 15.5 %   Platelets 202  150 - 400 K/uL  COMPREHENSIVE METABOLIC PANEL     Status: Abnormal   Collection Time    07/19/14  1:58 AM      Result Value Ref Range   Sodium 140  137 - 147 mEq/L   Potassium 3.8  3.7 - 5.3 mEq/L   Chloride 100  96 - 112 mEq/L   CO2 28  19 - 32 mEq/L   Glucose, Bld 102 (*) 70 - 99 mg/dL   BUN 23  6 - 23 mg/dL   Creatinine, Ser 1.26 (*) 0.50 - 1.10 mg/dL   Calcium 10.2  8.4 - 10.5 mg/dL   Total Protein 7.5  6.0 - 8.3 g/dL   Albumin 3.8  3.5 - 5.2 g/dL   AST 23  0 - 37 U/L   ALT 43 (*) 0 - 35 U/L   Alkaline Phosphatase 84  39 - 117 U/L   Total Bilirubin 0.2 (*) 0.3 - 1.2 mg/dL   GFR calc non Af Amer 51 (*) >90 mL/min   GFR calc Af Amer 59 (*) >90 mL/min   Comment: (NOTE)     The eGFR has been calculated using the CKD EPI equation.     This  calculation has not been validated in all clinical situations.     eGFR's persistently <90 mL/min signify possible Chronic Kidney     Disease.   Anion gap 12  5 - 15  ETHANOL     Status: None   Collection Time    07/19/14  1:58 AM      Result Value Ref Range   Alcohol, Ethyl (B) <11  0 - 11 mg/dL   Comment:            LOWEST DETECTABLE LIMIT FOR     SERUM ALCOHOL IS 11 mg/dL     FOR MEDICAL PURPOSES ONLY  URINE RAPID DRUG SCREEN (HOSP PERFORMED)     Status: None   Collection Time    07/19/14  4:00 AM      Result Value Ref Range   Opiates NONE DETECTED  NONE DETECTED   Cocaine NONE DETECTED  NONE DETECTED   Benzodiazepines NONE DETECTED  NONE DETECTED   Amphetamines NONE DETECTED  NONE DETECTED   Tetrahydrocannabinol NONE DETECTED  NONE DETECTED   Barbiturates NONE DETECTED  NONE DETECTED   Comment:            DRUG SCREEN FOR MEDICAL PURPOSES     ONLY.  IF CONFIRMATION IS NEEDED     FOR ANY PURPOSE, NOTIFY LAB     WITHIN 5 DAYS.                LOWEST DETECTABLE LIMITS     FOR URINE DRUG SCREEN     Drug Class       Cutoff (ng/mL)     Amphetamine      1000     Barbiturate  200     Benzodiazepine   387     Tricyclics       564     Opiates          300     Cocaine          300     THC              50  URINALYSIS, ROUTINE W REFLEX MICROSCOPIC     Status: Abnormal   Collection Time    07/19/14  4:00 AM      Result Value Ref Range   Color, Urine STRAW (*) YELLOW   APPearance CLEAR  CLEAR   Specific Gravity, Urine 1.015  1.005 - 1.030   pH 7.0  5.0 - 8.0   Glucose, UA NEGATIVE  NEGATIVE mg/dL   Hgb urine dipstick SMALL (*) NEGATIVE   Bilirubin Urine NEGATIVE  NEGATIVE   Ketones, ur NEGATIVE  NEGATIVE mg/dL   Protein, ur NEGATIVE  NEGATIVE mg/dL   Urobilinogen, UA 0.2  0.0 - 1.0 mg/dL   Nitrite NEGATIVE  NEGATIVE   Leukocytes, UA NEGATIVE  NEGATIVE  URINE MICROSCOPIC-ADD ON     Status: Abnormal   Collection Time    07/19/14  4:00 AM      Result Value Ref Range    Squamous Epithelial / LPF MANY (*) RARE   WBC, UA 0-2  <3 WBC/hpf   RBC / HPF 0-2  <3 RBC/hpf   Bacteria, UA MANY (*) RARE   Psychological Evaluations:  Assessment:   DSM5:  AXIS I:  Paranoid Schizophrenia              Delusional Disorder  AXIS II:  Deferred AXIS III:   Past Medical History  Diagnosis Date  . Thyroid disease   . Fibroid tumor   . DVT (deep venous thrombosis) 09/30/12    in upper extremities b/l and LLE  . Anemia   . Thyroid nodule     s/p biopsy  . Graves disease   . Multinodular goiter 10/01/2012  . Lupus anticoagulant positive 10/02/2012  . Hypertension   . 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 Arnoldo Morale on 12/13/2012.  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 Admit for crisis management and stabilization. Estimated length of stay 5-7 days. 2. Medication management to reduce current symptoms to base line and improve the patient's level of functioning.  3. Develop treatment plan to decrease risk of relapse upon discharge of depressive symptoms and the need for readmission. 5. Group therapy to facilitate development of healthy coping skills to use for psychosis.  6. Health care follow up as needed for medical problems.  7. Discharge plan to include therapy to help patient cope with  stressors.  8. Call for Consult with Hospitalist for additional specialty patient services as needed.   Treatment Plan Summary: Daily contact with patient to assess and evaluate symptoms and progress in treatment Medication management Current Medications:  Current  Facility-Administered Medications  Medication Dose Route Frequency Provider Last Rate Last Dose  . alum & mag hydroxide-simeth (MAALOX/MYLANTA) 200-200-20 MG/5ML suspension 30 mL  30  mL Oral Q4H PRN Benjamine Mola, FNP      . amLODipine (NORVASC) tablet 5 mg  5 mg Oral Daily Keerstin Bjelland   5 mg at 07/20/14 1227  . benztropine (COGENTIN) tablet 0.5 mg  0.5 mg Oral BID Justine Dines      . calcium carbonate (TUMS - dosed in mg elemental calcium) chewable tablet 200 mg of elemental calcium  1 tablet Oral BID Jaden Batchelder      . famotidine (PEPCID) tablet 20 mg  20 mg Oral Daily Galilea Quito   20 mg at 07/20/14 1226  . ferrous sulfate tablet 325 mg  325 mg Oral Daily Aqsa Sensabaugh      . labetalol (NORMODYNE) tablet 200 mg  200 mg Oral BID Merita Hawks      . levothyroxine (SYNTHROID, LEVOTHROID) tablet 125 mcg  125 mcg Oral Daily Joniece Smotherman   125 mcg at 07/20/14 1226  . magnesium hydroxide (MILK OF MAGNESIA) suspension 30 mL  30 mL Oral Daily PRN Benjamine Mola, FNP      . naproxen (NAPROSYN) tablet 500 mg  500 mg Oral BID WC Lee Kalt      . polyvinyl alcohol (LIQUIFILM TEARS) 1.4 % ophthalmic solution 1 drop  1 drop Both Eyes BID Benito Lemmerman      . risperiDONE (RISPERDAL) tablet 2 mg  2 mg Oral BID Margo Lama      . traZODone (DESYREL) tablet 100 mg  100 mg Oral QHS PRN Lashina Milles        Observation Level/Precautions:  15 minute checks  Laboratory:  CBC Chemistry Profile UDS  Psychotherapy:  Individual and Group Therapy  Medications:  Risperdal 2 mg BID for psychosis, Cogentin 0.5 mg BID for EPS prevention, Trazodone 100 mg hs prn insomnia   Consultations:  As needed  Discharge Concerns:  Safety and Stability   Estimated LOS: 5-7 days   Other:  Increase collateral information from family    I certify that inpatient services furnished can reasonably be expected to improve the patient's condition.   Elmarie Shiley  NP-C  7/23/20152:00 PM   Patient  seen, evaluated and I agree with notes by Nurse Practitioner. Corena Pilgrim, MD

## 2014-07-20 NOTE — Progress Notes (Signed)
ANTICOAGULATION CONSULT NOTE - Initial Consult  Pharmacy Consult for Warfarin Indication: DVT  No Known Allergies  Patient Measurements: Height: 5' 7.75" (172.1 cm) Weight: 290 lb (131.543 kg) IBW/kg (Calculated) : 63.33  Vital Signs: Temp: 98.5 F (36.9 C) (07/23 1133) Temp src: Oral (07/23 1133) BP: 150/97 mmHg (07/23 1604) Pulse Rate: 89 (07/23 1604)  Labs:  Recent Labs  07/19/14 0158 07/20/14 1941  HGB 13.1  --   HCT 39.4  --   PLT 202  --   LABPROT  --  16.8*  INR  --  1.36  CREATININE 1.26*  --    Estimated Creatinine Clearance: 80.6 ml/min (by C-G formula based on Cr of 1.26).  Medical History: Past Medical History  Diagnosis Date  . Thyroid disease   . Fibroid tumor   . DVT (deep venous thrombosis) 09/30/12    in upper extremities b/l and LLE  . Anemia   . Thyroid nodule     s/p biopsy  . Graves disease   . Multinodular goiter 10/01/2012  . Lupus anticoagulant positive 10/02/2012  . Hypertension   . 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 Arnoldo Morale on 12/13/2012.  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 amLODipine  5 mg Oral Daily  . benztropine  0.5 mg Oral BID  . calcium carbonate  1 tablet Oral BID  . famotidine  20 mg Oral Daily  . ferrous sulfate  325 mg Oral Daily  . labetalol  200 mg Oral BID  . levothyroxine  125 mcg Oral Daily  . naproxen  500 mg Oral BID WC  . polyvinyl alcohol  1 drop Both Eyes BID  . risperiDONE  2 mg Oral BID   Assessment: 29 yoF with hx of DVT, non-compliant on Warfarin PTA. Home Warfarin dose noted as 7.5mg  Mon/Thurs, 5mg  other days. Last dose Warfarin unknown. Ad[facility] to [facility].  Admit INR 1.36  Progress note from 08/04/13 Dr. Buelah Manis: Since she has been truly noncompliant with her anticoagulation, a repeat a hypercoagulable panel and d-dimer was performed on 07/14/2013. I personally reviewed and went over laboratory results with the  patient. Her panel was negative with a low anticardiolipin IgA, IgG, and IgM level. No lupus anticoagulant detected. Negative antithrombin II gene mutation. Negative Factor V Leiden. Her D-Dimer was WNL. Protein C activity is elevated, but not low, and Protein S is WNL. Clinically, she does not have any signs or symptoms of DVT or clots. No indication for PE. She has not be on anticoagulation since prior to Jan 2014. With the above mentioned negative panel, we will release her from the clinic and I have asked her to follow-up with her PCP.  Goal of Therapy:  INR 2-3   Plan:   Warfarin 10mg  tonight  Daily INR ordered  Consider stopping Warfarin, patient is non-compliant, see excerpt above from progress note 08/04/13  Minda Ditto PharmD Pager [phone] 07/20/2014, 8:33 PM

## 2014-07-20 NOTE — BHH Counselor (Signed)
Adult Comprehensive Assessment  Patient ID: YAJAIRA DOFFING, female   DOB: [birth year], 43 y.o.   MRN: [id number]  Information Source:    Current Stressors:  Employment / Job issues: Unemployed since 18-Apr-2012 Family Relationships: Estranged Museum/gallery curator / Lack of resources (include bankruptcy): No income  Says she is eligible for disability through [place], that she has applied and been approved, and now just waiting for checks to start Housing / Lack of housing: Homeless Physical health (include injuries & life threatening diseases): Thyroid removed, Hysterectomy  Living/Environment/Situation:  Living Arrangements: Alone Living conditions (as described by patient or guardian): "I'm homeless" How long has patient lived in current situation?: Left her husband of 6 years a couple of weeks ago. What is atmosphere in current home: Temporary  Family History:  Marital status: Separated Separated, when?: see above What types of issues is patient dealing with in the relationship?: "I found out he was bi-sexual.  He denied it.  I left." Does patient have children?: No  Childhood History:  By whom was/is the patient raised?: Both parents Description of patient's relationship with caregiver when they were a child: good with mother Patient's description of current relationship with people who raised him/her: mother deceased, bad with father Does patient have siblings?: Yes Number of Siblings: 1 Description of patient's current relationship with siblings: Sister died in 04-18-2000 Did patient suffer any verbal/emotional/physical/sexual abuse as a child?: No Did patient suffer from severe childhood neglect?: No Has patient ever been sexually abused/assaulted/raped as an adolescent or adult?: No Was the patient ever a victim of a crime or a disaster?: No Witnessed domestic violence?: No Has patient been effected by domestic violence as an adult?: No  Education:  Highest grade of school patient has completed:  Associates degree in ECE Currently a student?: No Learning disability?: No  Employment/Work Situation:   Employment situation: Unemployed Why is patient on disability: Waiting for disability through [place] to start How long has patient been on disability: Approved a year ago Patient's job has been impacted by current illness: Yes Describe how patient's job has been impacted: Unable to work due to medical What is the longest time patient has a held a job?: 9 years Where was the patient employed at that time?: [employer] Has patient ever been in the [employer]?: Yes (Describe in comment) ([place] [employer]) Has patient ever served in Recruitment consultant?: No  Financial Resources:   Museum/gallery curator resources: No income Does patient have a Programmer, applications or guardian?: No  Alcohol/Substance Abuse:   Alcohol/Substance Abuse Treatment Hx: Denies past history Has alcohol/substance abuse ever caused legal problems?: No  Social Support System:   Heritage manager System: None Type of faith/religion: Darrick Meigs How does patient's faith help to cope with current illness?: God gives me messages  Leisure/Recreation:      Strengths/Needs:      Discharge Plan:   Does patient have access to transportation?: Yes Will patient be returning to same living situation after discharge?: No Plan for living situation after discharge: shelter Currently receiving community mental health services: No If no, would patient like referral for services when discharged?: No Does patient have financial barriers related to discharge medications?: Yes Patient description of barriers related to discharge medications: No income, no insurance  Summary/Recommendations:   Summary and Recommendations (to be completed by the evaluator): Tanise is a 43 YO AA female who is admitted for psychosis.  According to the records, and her verification, she was just released from [facility]  She  initially denied that she was ever  hospitalized in behavioral health setting before.  She exhibits no signs nor symptoms of psychosis today.  she is not sleeping, and says it is because she does not feel safe here.  She signed a release for [place] to get information from [facility] and talk to her father.  In the meantime, she can benefit from crises stabilization, medication managment, therapeutic milieu, collaterol information gathering and referral for services  Roque Lias B. 07/20/2014

## 2014-07-20 NOTE — BHH Group Notes (Signed)
[facility] [place] LCSW Group Therapy  07/20/2014 , 3:22 PM   Type of Therapy:  Group Therapy  Participation Level:  Did not attend  Summary of Progress/Problems: Today's group focused on the term Diagnosis.  Participants were asked to define the term, and then pronounce whether it is a negative, positive or neutral term.  [name] B 07/20/2014 , 3:22 PM

## 2014-07-20 NOTE — Progress Notes (Signed)
Pt received no sleep tonight. Pt refused something for sleep multiple times. Pt insisted that she is tired and will fall asleep on her own. Pt is religiously preoccupied. Pt reports that she is getting rid of the demons. Pt was often observed pacing in the room and responding to internal stimuli ( as she held her bible).

## 2014-07-20 NOTE — Tx Team (Signed)
  Interdisciplinary Treatment Plan Update   Date Reviewed:  07/20/2014  Time Reviewed:  8:25 AM  Progress in Treatment:   Attending groups: Yes Participating in groups: Yes Taking medication as prescribed: Yes  Tolerating medication: Yes Family/Significant other contact made: No  Patient understands diagnosis: No  Pt denies mental illness  Discussing patient identified problems/goals with staff: Yes See initial care plan. Medical problems stabilized or resolved: Yes Denies suicidal/homicidal ideation: Yes  In tx team Patient has not harmed self or others: Yes  For review of initial/current patient goals, please see plan of care.  Estimated Length of Stay:  4-5 days  Reason for Continuation of Hospitalization: Medication stabilization Other; describe Gather collateral information to determine course of treatment  New Problems/Goals identified:  N/A  Discharge Plan or Barriers:   unknown  Additional Comments:  [name] is an 42 y.o. female BIB by law enforcement to [facility] ED because she was walking the streets, and claiming to be a prophet. Pt was discharged from [facility] earlier in the day (07/18/14). Pt reports she was planning on going to her father's house after d/c but he refused to let her. Pt sts "he knew in his own way," that she was coming despite her not speaking to him from the hospital. Pt sts when her father declined to let her stay with him she started walking. She walked from [place] to [place]. Pt sts this was 11.4 miles. Pt reports she was going to a church to try to get help and a place to stay. She reports she offered to do work for them, but that did not work out and she was brought to ED. Pt sts she is a messenger, prophet of God, and receives words from God daily    Attendees:  Signature: Corena Pilgrim, MD 07/20/2014 8:25 AM   Signature: Ripley Fraise, LCSW 07/20/2014 8:25 AM  Signature: Elmarie Shiley, NP 07/20/2014 8:25 AM  Signature: Mayra Neer, RN  07/20/2014 8:25 AM  Signature: Darrol Angel, RN 07/20/2014 8:25 AM  Signature:  07/20/2014 8:25 AM  Signature:   07/20/2014 8:25 AM  Signature:    Signature:    Signature:    Signature:    Signature:    Signature:      Scribe for Treatment Team:   Ripley Fraise, LCSW  07/20/2014 8:25 AM

## 2014-07-20 NOTE — Progress Notes (Signed)
D: Pt presents with flat affect, blunted mood.  Pt reports "I'm just really tired.  I had a rough time getting here yesterday and I just need some rest."  Pt states her goal for the day is to get more rest.  Pt denies SI/HI.  Denies pain.  Denies AH/VH.   A:  Medications administered per order.  Safety maintained.  Encouraged pt to attend group and report any concerns to staff.  R:  Pt interacted with staff appropriately.  Pt stated she would notify staff if thinking of harming self or others.  Reported she would inform staff of any concerns.  Will continue to monitor for safety.

## 2014-07-20 NOTE — [facility] Group Notes (Signed)
[facility] Group Notes:  (Nursing/MHT/Case Management/Adjunct)  Date:  07/20/2014  Time: 0900 am  Type of Therapy:  Psychoeducational Skills  Participation Level:  Did Not Attend   Zipporah Plants 07/20/2014, 2:04 PM

## 2014-07-21 LAB — PROTIME-INR
INR: 1.38 (ref 0.00–1.49)
Prothrombin Time: 17 seconds — ABNORMAL HIGH (ref 11.6–15.2)

## 2014-07-21 MED ORDER — WARFARIN SODIUM 5 MG PO TABS
10.0000 mg | ORAL_TABLET | Freq: Once | ORAL | Status: AC
  Administered 2014-07-21: 10 mg via ORAL
  Filled 2014-07-21: qty 2
  Filled 2014-07-21: qty 1

## 2014-07-21 NOTE — Plan of Care (Signed)
Problem: Ineffective individual coping Goal: STG: Patient will remain free from self harm Outcome: Progressing Pt did not harm self this shift.    Problem: Alteration in thought process Goal: STG-Patient is able to follow short directions Outcome: Progressing Pt able to follow directions this shift.   Goal: STG-Patient is able to sleep at least 6 hours per night Outcome: Progressing Pt has been asleep for 6 hours this shift.

## 2014-07-21 NOTE — Progress Notes (Signed)
Adult Psychoeducational Group Note  Date:  07/21/2014 Time:  9:41 PM  Group Topic/Focus:  Wrap-Up Group:   The focus of this group is to help patients review their daily goal of treatment and discuss progress on daily workbooks.  Participation Level:  Did Not Attend  Participation Quality:  Drowsy  Affect:  Flat  Cognitive:  Disorganized and Confused  Insight: Limited and None  Engagement in Group:  Lacking  Modes of Intervention:  None  Additional Comments:  Pt was not in group decided to stay in their room   Oluwademilade Kellett R 07/21/2014, 9:41 PM

## 2014-07-21 NOTE — BHH Group Notes (Signed)
[facility] LCSW Group Therapy  07/21/2014  1:05 PM  Type of Therapy:  Group therapy  Participation Level:  Invited.  Chose not to attend  Summary of Progress/Problems:  Chaplain was here to lead a group on themes of hope and courage.  Roque Lias B 07/21/2014 2:07 PM

## 2014-07-21 NOTE — Progress Notes (Signed)
ANTICOAGULATION CONSULT NOTE - Follow Up Consult  Pharmacy Consult for Warfarin Indication: DVT  No Known Allergies  Patient Measurements: Height: 5' 7.75" (172.1 cm) Weight: 290 lb (131.543 kg) IBW/kg (Calculated) : 63.33 Heparin Dosing Weight:   Vital Signs: Temp: 98 F (36.7 C) (07/24 0555) BP: 125/93 mmHg (07/24 0556) Pulse Rate: 102 (07/24 0556)  Labs:  Recent Labs  07/19/14 0158 07/20/14 1941 07/21/14 0612  HGB 13.1  --   --   HCT 39.4  --   --   PLT 202  --   --   LABPROT  --  16.8* 17.0*  INR  --  1.36 1.38  CREATININE 1.26*  --   --     Estimated Creatinine Clearance: 80.6 ml/min (by C-G formula based on Cr of 1.26).   Medications:  Scheduled:  . amLODipine  5 mg Oral Daily  . benztropine  0.5 mg Oral BID  . calcium carbonate  1 tablet Oral BID  . famotidine  20 mg Oral Daily  . ferrous sulfate  325 mg Oral Daily  . labetalol  200 mg Oral BID  . levothyroxine  125 mcg Oral Daily  . naproxen  500 mg Oral BID WC  . polyvinyl alcohol  1 drop Both Eyes BID  . risperiDONE  2 mg Oral BID  . warfarin  10 mg Oral ONCE-1800  . Warfarin - Pharmacist Dosing Inpatient   Does not apply q1800   Infusions:   PRN: alum & mag hydroxide-simeth, magnesium hydroxide, traZODone  Assessment: 49 yoF with hx of DVT, non-compliant on Warfarin PTA. Home Warfarin dose noted as 7.5mg  Mon/Thurs, 5mg  other days. Last dose Warfarin unknown. Admit to [facility]. Progress note from 08/04/13 Dr. Buelah Manis: Since she has been truly noncompliant with her anticoagulation, a repeat a hypercoagulable panel and d-dimer was performed on 07/14/2013. I personally reviewed and went over laboratory results with the patient. Her panel was negative with a low anticardiolipin IgA, IgG, and IgM level. No lupus anticoagulant detected. Negative antithrombin II gene mutation. Negative Factor V Leiden. Her D-Dimer was WNL. Protein C activity is elevated, but not low, and Protein S is WNL. Clinically, she  does not have any signs or symptoms of DVT or clots. No indication for PE. She has not be on anticoagulation since prior to Jan 2014. With the above mentioned negative panel, we will release her from the clinic and I have asked her to follow-up with her PCP.     Goal of Therapy:  INR 2-3    Plan:  Warfarin 10mg  tonight--INR up slightly from 7/23  Daily INR   Consider stopping Warfarin, patient is non-compliant, see excerpt above from progress note 08/04/13  Gypsy Decant 07/21/2014,2:24 PM

## 2014-07-21 NOTE — Progress Notes (Signed)
Patient ID: [name], female   DOB: [birth year], 43 y.o.   MRN: [id number] D. Patient presents with depressed mood, affect flat. [name] has been isolative to her room throughout most of shift thus far. She states '' I need to get my rest, I'm feeling a little better since I slept. '' She denies any AH/VH with Probation officer. She denies any SI. She states '' I am homeless and my husband was cheating on me so I am not going back to him. '' A. Support and encouragement provided. Discussed above information with Dr. Candelaria Stagers team. Medications given as ordered. R. Patient is in no acute distress at this time. Will continue to monitor q 15 minutes for safety.

## 2014-07-21 NOTE — Progress Notes (Signed)
[facility] MD Progress Note  07/21/2014 11:13 AM [name]  MRN:  [id number] Subjective: Patient states: I am not crazy, I am just homeless and the police picked me up and took me to the ER in [place].'' Objective: Patient is seen lying in her bed. She denies prior history of mental illness until a couple of weeks ago when she found out in her vision that her husband was "bisexual". She states that she was admitted to [facility] for 9 days and was discharged on 07/18/14. Patient was found wandering on the streets telling people that she was a messenger of God and receiving messages from God. However, patient claimed that she is not delusional or psychosis, she states that she is just homeless. Patient was extremely disorganized and labile yesterday, however, she has been less agitated today. She has also been compliant with her medications and has not reported any adverse reactions. Diagnosis:   DSM5: Schizophrenia Disorders:  Delusional Disorder (297.1) and Brief Psychotic Disorder (298.8) Obsessive-Compulsive Disorders:   Trauma-Stressor Disorders:   Substance/Addictive Disorders:   Depressive Disorders:  Disruptive Mood Dysregulation Disorder (296.99) Total Time spent with patient: 30 minutes  Axis I: Paranoid schizophrenia  Axis II: Cluster B Traits Axis III:  Past Medical History  Diagnosis Date  . Thyroid disease   . Fibroid tumor   . DVT (deep venous thrombosis) 09/30/12    in upper extremities b/l and LLE  . Anemia   . Thyroid nodule     s/p biopsy  . Graves disease   . Multinodular goiter 10/01/2012  . Lupus anticoagulant positive 10/02/2012  . Hypertension   . 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 Arnoldo Morale on 12/13/2012.  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 Her affect is labile. She is agitated. Thought content is paranoid. Cognition and memory are normal. She expresses impulsivity.    Review of Systems  Constitutional: Negative.   HENT: Negative.   Eyes: Negative.   Respiratory: Negative.   Cardiovascular: Negative.   Gastrointestinal: Negative.   Genitourinary: Negative.   Musculoskeletal: Negative.   Skin: Negative.   Endo/Heme/Allergies: Negative.   Psychiatric/Behavioral: Positive for hallucinations. The patient has insomnia.     Blood pressure 125/93, pulse 102, temperature 98 F (36.7 C), temperature source Oral, resp. rate 18, height 5' 7.75" (1.721 m), weight 131.543 kg (290 lb), last menstrual period 09/18/2012.Body mass index is 44.41 kg/(m^2).  General Appearance: Disheveled  Eye Contact::  Good  Speech:  Pressured  Volume:  Increased  Mood:  Angry and Irritable  Affect:  Labile  Thought Process:  Circumstantial and Disorganized  Orientation:  Full (Time, Place, and Person)  Thought Content:  Hallucinations: Visual and Paranoid Ideation  Suicidal Thoughts:  No  Homicidal Thoughts:  No  Memory:  Immediate;   Fair Recent;   Fair Remote;   Fair  Judgement:  Impaired  Insight:  Lacking  Psychomotor Activity:  Increased  Concentration:  Poor  Recall:  [place]  Language: Good  Akathisia:  No  Handed:  Right  AIMS (if indicated):     Assets:  Communication Skills  Sleep:  Number of Hours: 6.25   Musculoskeletal: Strength & Muscle Tone: within normal limits Gait & Station: normal Patient leans: N/A  Current Medications: Current Facility-Administered Medications  Medication Dose Route Frequency Provider Last Rate Last Dose  . alum & mag hydroxide-simeth (MAALOX/MYLANTA) 200-200-20 MG/5ML  suspension 30 mL  30 mL Oral Q4H PRN Benjamine Mola, FNP      . amLODipine (NORVASC) tablet 5 mg  5 mg Oral Daily Marque Rademaker   5 mg at 07/21/14 0811  . benztropine (COGENTIN) tablet 0.5 mg  0.5 mg Oral BID Charvis Lightner   0.5 mg at 07/21/14 0808  . calcium carbonate (TUMS - dosed in mg elemental calcium) chewable tablet 200 mg of elemental calcium  1 tablet Oral BID Lina Hitch   200 mg of elemental calcium at 07/21/14 0808  . famotidine (PEPCID) tablet 20 mg  20 mg Oral Daily Courtney Fenlon   20 mg at 07/21/14 0807  . ferrous sulfate tablet 325 mg  325 mg Oral Daily Abdikadir Fohl   325 mg at 07/21/14 7510  . labetalol (NORMODYNE) tablet 200 mg  200 mg Oral BID Jessica Checketts   200 mg at 07/21/14 0807  . levothyroxine (SYNTHROID, LEVOTHROID) tablet 125 mcg  125 mcg Oral Daily Chayah Mckee   125 mcg at 07/21/14 0640  . magnesium hydroxide (MILK OF MAGNESIA) suspension 30 mL  30 mL Oral Daily PRN Benjamine Mola, FNP      . naproxen (NAPROSYN) tablet 500 mg  500 mg Oral BID WC Texie Tupou   500 mg at 07/21/14 0807  . polyvinyl alcohol (LIQUIFILM TEARS) 1.4 % ophthalmic solution 1 drop  1 drop Both Eyes BID Deland Slocumb   1 drop at 07/20/14 1701  . risperiDONE (RISPERDAL) tablet 2 mg  2 mg Oral BID Chinenye Katzenberger   2 mg at 07/21/14 0807  . traZODone (DESYREL) tablet 100 mg  100 mg Oral QHS PRN Tyashia Morrisette      . Warfarin - Pharmacist Dosing Inpatient   Does not apply q1800 Minda Ditto, [facility]        Lab Results:  Results for orders placed during the hospital encounter of 07/19/14 (from the past 48 hour(s))  PROTIME-INR     Status: Abnormal   Collection Time    07/20/14  7:41 PM      Result Value Ref Range   Prothrombin Time 16.8 (*) 11.6 - 15.2 seconds   INR 1.36  0.00 - 1.49   Comment: Performed at [place]     Status: Abnormal   Collection Time    07/21/14  6:12 AM      Result Value Ref Range   Prothrombin Time 17.0 (*) 11.6 -  15.2 seconds   INR 1.38  0.00 - 1.49   Comment: Performed at [facility]    Physical Findings: AIMS: Facial and Oral Movements Muscles of Facial Expression: None, normal Lips and Perioral Area: None, normal Jaw: None, normal Tongue: None, normal,Extremity Movements Upper (arms, wrists, hands, fingers): None, normal Lower (legs, knees, ankles, toes): None, normal, Trunk Movements Neck, shoulders, hips: None, normal, Overall Severity Severity of abnormal movements (highest score from questions above): None, normal Incapacitation due to abnormal movements: None, normal Patient's awareness of abnormal movements (rate only patient's report): No Awareness, Dental Status Current problems with teeth and/or dentures?: No Does patient usually wear dentures?: No  CIWA:  CIWA-Ar Total: 2 COWS:  COWS Total Score: 3  Treatment Plan Summary: Daily contact with patient to assess and evaluate symptoms and progress in treatment Medication management  Plan:1. Admit for crisis management and stabilization. 2. Medication management to reduce current symptoms to base line and improve the patient's overall level of functioning: -Continue Risperdal 2mg  po bid for mood/delusions/psychosis -Continue Benztropine 0.5mg  po BID for EPS prevention 3. Treat health problems as indicated. 4. Develop treatment plan to decrease risk of relapse upon discharge and the need for readmission. 5. Psycho-social education regarding relapse prevention and self care. 6. Health care follow up as needed for medical problems. 7. Restart home medications where appropriate.  Medical Decision Making Problem Points:  Established problem, worsening (2), Review of last therapy session (1) and Review of psycho-social stressors (1) Data Points:  Order Aims Assessment (2) Review or order clinical lab tests (1) Review of medication regiment & side effects (2) Review of new medications or change in dosage (2)  I  certify that inpatient services furnished can reasonably be expected to improve the patient's condition.   Corena Pilgrim, MD 07/21/2014, 11:13 AM

## 2014-07-22 DIAGNOSIS — F200 Paranoid schizophrenia: Principal | ICD-10-CM

## 2014-07-22 LAB — PROTIME-INR
INR: 1.79 — ABNORMAL HIGH (ref 0.00–1.49)
PROTHROMBIN TIME: 20.8 s — AB (ref 11.6–15.2)

## 2014-07-22 MED ORDER — WARFARIN SODIUM 7.5 MG PO TABS
7.5000 mg | ORAL_TABLET | Freq: Once | ORAL | Status: DC
  Filled 2014-07-22: qty 1

## 2014-07-22 MED ORDER — WARFARIN SODIUM 5 MG PO TABS
5.0000 mg | ORAL_TABLET | Freq: Once | ORAL | Status: AC
  Administered 2014-07-22: 5 mg via ORAL
  Filled 2014-07-22 (×2): qty 1

## 2014-07-22 NOTE — Progress Notes (Signed)
Patient ID: [name], female   DOB: 06-Apr-1971, 43 y.o.   MRN: [id number] [facility] MD Progress Note  07/22/2014 10:55 AM FIORA WEILL  MRN:  [id number] Subjective: Patient states: I am not crazy " She was not irritable today.  Objective: Patient is seen lying in her bed. She denies prior history of mental illness until a couple of weeks ago when she found out in her vision that her husband was "bisexual". She states that she was admitted to [facility] for 9 days and was discharged on 07/18/14. Patient was found wandering on the streets telling people that she was a messenger of God and receiving messages from God. However, patient claimed that she is not delusional or psychosis, she states that she is just homeless. Patient was reported to be disorganized at admission but is now showing improvement in and is not isolative or defiant. She also remains compliant with her medications and has not reported any adverse reactions. Diagnosis:   DSM5: Schizophrenia Disorders:  Delusional Disorder (297.1) and Brief Psychotic Disorder (298.8) Obsessive-Compulsive Disorders:   Trauma-Stressor Disorders:   Substance/Addictive Disorders:   Depressive Disorders:  Disruptive Mood Dysregulation Disorder (296.99) Total Time spent with patient: 30 minutes  Axis I: Paranoid schizophrenia  Axis II: Cluster B Traits Axis III:  Past Medical History  Diagnosis Date  . Thyroid disease   . Fibroid tumor   . DVT (deep venous thrombosis) 09/30/12    in upper extremities b/l and LLE  . Anemia   . Thyroid nodule     s/p biopsy  . Graves disease   . Multinodular goiter 10/01/2012  . Lupus anticoagulant positive 10/02/2012  . Hypertension   . 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 Arnoldo Morale on 12/13/2012.  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 Her affect is labile. She is agitated. Thought content is paranoid. Cognition and memory are normal. She expresses impulsivity.    Review of Systems  Constitutional: Negative.   HENT: Negative.   Eyes: Negative.   Respiratory: Negative.   Cardiovascular: Negative.   Gastrointestinal: Negative.   Genitourinary: Negative.   Musculoskeletal: Negative.   Skin: Negative.   Endo/Heme/Allergies: Negative.   Psychiatric/Behavioral: Positive for hallucinations. The patient has insomnia.     Blood pressure 109/77, pulse 118, temperature 98.6 F (37 C), temperature source Oral, resp. rate 16, height 5' 7.75" (1.721 m), weight 290 lb (131.543 kg), last menstrual period 09/18/2012.Body mass index is 44.41 kg/(m^2).  General Appearance: Disheveled  Eye Contact::  Good  Speech:  Pressured  Volume:  Increased  Mood:  Angry and Irritable  Affect:  Labile  Thought Process:  Circumstantial and Disorganized  Orientation:  Full (Time, Place, and Person)  Thought Content:  Hallucinations: Visual and Paranoid Ideation  Suicidal Thoughts:  No  Homicidal Thoughts:  No  Memory:  Immediate;   Fair Recent;   Fair Remote;   Fair  Judgement:  Impaired  Insight:  Lacking  Psychomotor Activity:  Increased  Concentration:  Poor  Recall:  [place]  Language: Good  Akathisia:  No  Handed:  Right  AIMS (if indicated):     Assets:  Communication Skills  Sleep:  Number of Hours: 6.25   Musculoskeletal: Strength & Muscle Tone: within normal limits Gait & Station: normal Patient leans: N/A  Current Medications: Current Facility-Administered Medications  Medication Dose Route Frequency Provider Last Rate Last Dose  . alum & mag hydroxide-simeth  (MAALOX/MYLANTA) 200-200-20 MG/5ML suspension 30 mL  30 mL Oral Q4H PRN Benjamine Mola, FNP      . amLODipine (NORVASC) tablet 5 mg  5 mg Oral Daily Mojeed Akintayo   5 mg at 07/22/14 0813  . benztropine (COGENTIN) tablet 0.5 mg  0.5 mg Oral BID Mojeed Akintayo   0.5 mg at 07/22/14 0813  . calcium carbonate (TUMS - dosed in mg elemental calcium) chewable tablet 200 mg of elemental calcium  1 tablet Oral BID Mojeed Akintayo   200 mg of elemental calcium at 07/22/14 0813  . famotidine (PEPCID) tablet 20 mg  20 mg Oral Daily Mojeed Akintayo   20 mg at 07/22/14 0813  . ferrous sulfate tablet 325 mg  325 mg Oral Daily Mojeed Akintayo   325 mg at 07/22/14 0813  . labetalol (NORMODYNE) tablet 200 mg  200 mg Oral BID Mojeed Akintayo   200 mg at 07/22/14 0813  . levothyroxine (SYNTHROID, LEVOTHROID) tablet 125 mcg  125 mcg Oral Daily Mojeed Akintayo   125 mcg at 07/22/14 0813  . magnesium hydroxide (MILK OF MAGNESIA) suspension 30 mL  30 mL Oral Daily PRN Benjamine Mola, FNP      . naproxen (NAPROSYN) tablet 500 mg  500 mg Oral BID WC Mojeed Akintayo   500 mg at 07/22/14 0813  . polyvinyl alcohol (LIQUIFILM TEARS) 1.4 % ophthalmic solution 1 drop  1 drop Both Eyes BID Mojeed Akintayo   1 drop at 07/20/14 1701  . risperiDONE (RISPERDAL) tablet 2 mg  2 mg Oral BID Mojeed Akintayo   2 mg at 07/22/14 0813  . traZODone (DESYREL) tablet 100 mg  100 mg Oral QHS PRN Mojeed Akintayo      . Warfarin - Pharmacist Dosing Inpatient   Does not apply q1800 Minda Ditto, [facility]        Lab Results:  Results for orders placed during the hospital encounter of 07/19/14 (from the past 48 hour(s))  PROTIME-INR     Status: Abnormal   Collection Time    07/20/14  7:41 PM      Result Value Ref Range   Prothrombin Time 16.8 (*) 11.6 - 15.2 seconds   INR 1.36  0.00 - 1.49   Comment: Performed at [place]     Status: Abnormal   Collection Time    07/21/14  6:12 AM      Result Value Ref Range    Prothrombin Time 17.0 (*) 11.6 - 15.2 seconds   INR 1.38  0.00 - 1.49   Comment: Performed at [place]     Status: Abnormal   Collection Time    07/22/14  6:34 AM      Result Value Ref Range   Prothrombin Time 20.8 (*) 11.6 - 15.2 seconds   INR 1.79 (*) 0.00 - 1.49   Comment: Performed at [facility]    Physical Findings: AIMS: Facial and Oral Movements Muscles of Facial Expression: None, normal Lips and Perioral Area: None, normal Jaw: None, normal Tongue: None, normal,Extremity Movements Upper (arms, wrists, hands, fingers): None, normal Lower (legs, knees,  ankles, toes): None, normal, Trunk Movements Neck, shoulders, hips: None, normal, Overall Severity Severity of abnormal movements (highest score from questions above): None, normal Incapacitation due to abnormal movements: None, normal Patient's awareness of abnormal movements (rate only patient's report): No Awareness, Dental Status Current problems with teeth and/or dentures?: No Does patient usually wear dentures?: No  CIWA:  CIWA-Ar Total: 2 COWS:  COWS Total Score: 3  Treatment Plan Summary: Daily contact with patient to assess and evaluate symptoms and progress in treatment Medication management  Plan:1. Admit for crisis management and stabilization. 2. Medication management to reduce current symptoms to base line and improve the patient's overall level of functioning: -Continue Risperdal 2mg  po bid for mood/delusions/psychosis -Continue Benztropine 0.5mg  po BID for EPS prevention 3. Treat health problems as indicated. 4. Develop treatment plan to decrease risk of relapse upon discharge and the need for readmission. 5. Psycho-social education regarding relapse prevention and self care. 6. Health care follow up as needed for medical problems. 7. Restart home medications where appropriate.  Medical Decision Making Problem Points:  Established problem,  worsening (2), Review of last therapy session (1) and Review of psycho-social stressors (1) Data Points:  Order Aims Assessment (2) Review or order clinical lab tests (1) Review of medication regiment & side effects (2) Review of new medications or change in dosage (2)  I certify that inpatient services furnished can reasonably be expected to improve the patient's condition.   Merian Capron, MD 07/22/2014, 10:55 AM

## 2014-07-22 NOTE — Progress Notes (Signed)
Patient ID: [name], female   DOB: 09/28/1971, 43 y.o.   MRN: [id number] D: Patient in bed sleeping on approach. Pt reports unable to sleep for a couple of days and is trying to catch up. Pt did not attend evening wrap up group and has been isolative in room all evening. Pt thought process is organized and behavior is appropriate. Pt denies suicidal /homicidal ideation intent and plan. Pt denies auditory and visual hallucination. Cooperative with assessment. No acute distressed noted at this time.   A: Met with pt 1:1.  Writer encouraged pt to discuss feelings. Pt encouraged to come to staff with any questions or concerns. Snack and drink offered to pt.  R: Patient is safe on the unit. Pt accepted.  

## 2014-07-22 NOTE — Progress Notes (Signed)
The focus of this group is to help patients review their daily goal of treatment and discuss progress on daily workbooks. Pt attended the evening group session and responded to all discussion prompts from the [place]. Pt shared that today was a good day on the unit, the highlight of which was going outside the courtyard. "I just sat and enjoyed the scenery and the fresh air." Pt told the group that, overall, it had been a "mellow" day. Pt had no additional requests from Nursing Staff this evening. Pt's affect was appropriate.

## 2014-07-22 NOTE — BHH Group Notes (Signed)
[facility] Group Notes:  (Clinical Social Work)  07/22/2014   11:15am-12:00pm  Summary of Progress/Problems:  The main focus of today's process group was to listen to a variety of genres of music and to identify that different types of music provoke different responses.  The patient then was able to identify personally what was soothing for them, as well as energizing.    The patient expressed understanding of how music can be used at home as a wellness/recovery tool.  She chose a love ballad to listen to.  Type of Therapy:  Music Therapy   Participation Level:  Active  Participation Quality:  Attentive and Sharing  Affect:  Blunted  Cognitive:  Oriented  Insight:  Engaged  Engagement in Therapy:  Engaged  Modes of Intervention:   Activity, Exploration  Selmer Dominion, LCSW 07/22/2014, 11:47 AM

## 2014-07-22 NOTE — Progress Notes (Signed)
Patient ID: [name], female   DOB: June 02, 1971, 43 y.o.   MRN: [id number] D. Patient presents with depressed mood, affect flat again today. [name] has been cooperative, and pleasant throughout shift thus far. However she continues to report concerns regarding her housing after discharge. She states ''I was in the [employer], I need to see what benefits I can get to help me.''  Pt denies depression, SI/HI/A/V Hallucinations. A. Support and encouragement provided. Discussed patients progress with Dr. Olena Heckle. Encouraged pt to discuss housing concerns with social work.  Medications given as ordered. R. Patient is in no acute distress at this time. Will continue to monitor q 15 minutes for safety.

## 2014-07-22 NOTE — Progress Notes (Signed)
ANTICOAGULATION CONSULT NOTE - Follow Up Consult  Pharmacy Consult for Warfarin Indication: DVT  No Known Allergies  Patient Measurements: Height: 5' 7.75" (172.1 cm) Weight: 290 lb (131.543 kg) IBW/kg (Calculated) : 63.33  Vital Signs: Temp: 98.7 F (37.1 C) (07/25 1200) Temp src: Oral (07/25 1200) BP: 107/66 mmHg (07/25 1201) Pulse Rate: 86 (07/25 1201)  Labs:  Recent Labs  07/20/14 1941 07/21/14 0612 07/22/14 0634  LABPROT 16.8* 17.0* 20.8*  INR 1.36 1.38 1.79*    Estimated Creatinine Clearance: 80.6 ml/min (by C-G formula based on Cr of 1.26).   Medications:  Scheduled:  . amLODipine  5 mg Oral Daily  . benztropine  0.5 mg Oral BID  . calcium carbonate  1 tablet Oral BID  . famotidine  20 mg Oral Daily  . ferrous sulfate  325 mg Oral Daily  . labetalol  200 mg Oral BID  . levothyroxine  125 mcg Oral Daily  . naproxen  500 mg Oral BID WC  . polyvinyl alcohol  1 drop Both Eyes BID  . risperiDONE  2 mg Oral BID  . warfarin  5 mg Oral Once  . Warfarin - Pharmacist Dosing Inpatient   Does not apply q1800   Infusions:   PRN: alum & mag hydroxide-simeth, magnesium hydroxide, traZODone  Assessment: 72 yoF with hx of DVT, non-compliant on Warfarin PTA. Home Warfarin dose noted as 7.5mg  Mon/Thurs, 5mg  other days. Last dose Warfarin unknown. Ad[facility] to [facility]. Progress note from 08/04/13 Dr. Buelah Manis: Since she has been truly noncompliant with her anticoagulation, a repeat a hypercoagulable panel and d-dimer was performed on 07/14/2013. I personally reviewed and went over laboratory results with the patient. Her panel was negative with a low anticardiolipin IgA, IgG, and IgM level. No lupus anticoagulant detected. Negative antithrombin II gene mutation. Negative Factor V Leiden. Her D-Dimer was WNL. Protein C activity is elevated, but not low, and Protein S is WNL. Clinically, she does not have any signs or symptoms of DVT or clots. No indication for PE. She has not be  on anticoagulation since prior to Jan 2014. With the above mentioned negative panel, we will release her from the clinic and I have asked her to follow-up with her PCP.   Goal of Therapy:  INR 2-3   Plan:  Decrease Warfarin to 5mg  tonight since INR is rising quickly  Daily INR   Consider stopping Warfarin, patient is non-compliant, see excerpt above from progress note 08/04/13  Peggyann Juba, PharmD, BCPS 07/22/2014,4:05 PM

## 2014-07-23 LAB — PROTIME-INR
INR: 2.12 — AB (ref 0.00–1.49)
Prothrombin Time: 23.7 seconds — ABNORMAL HIGH (ref 11.6–15.2)

## 2014-07-23 MED ORDER — WARFARIN SODIUM 5 MG PO TABS
5.0000 mg | ORAL_TABLET | Freq: Once | ORAL | Status: AC
  Administered 2014-07-23: 5 mg via ORAL
  Filled 2014-07-23: qty 1

## 2014-07-23 NOTE — [facility] Group Notes (Signed)
[facility] Group Notes:  (Nursing/MHT/Case Management/Adjunct)  Date:  07/23/2014  Time:  9:10AM  Type of Therapy:  Psychoeducational Skills  Participation Level:  Active  Participation Quality:  Appropriate  Affect:  Appropriate  Cognitive:  Alert  Insight:  Good  Engagement in Group:  Engaged  Modes of Intervention:  Discussion  Summary of Progress/Problems:  Tretha Sciara 07/23/2014, 12:25 PM

## 2014-07-23 NOTE — Plan of Care (Signed)
Problem: Alteration in thought process Goal: STG-Patient does not respond to command hallucinations Outcome: Progressing Patient is denying AVH, command or otherwise.

## 2014-07-23 NOTE — Progress Notes (Signed)
ANTICOAGULATION CONSULT NOTE - Follow Up Consult  Pharmacy Consult for Coumadin Indication :H/O  DVT  No Known Allergies  Patient Measurements: Height: 5' 7.75" (172.1 cm) Weight: 290 lb (131.543 kg) IBW/kg (Calculated) : 63.33 Labs:  Recent Labs  07/21/14 0612 07/22/14 0634 07/23/14 0500  LABPROT 17.0* 20.8* 23.7*  INR 1.38 1.79* 2.12*    Estimated Creatinine Clearance: 80.6 ml/min (by C-G formula based on Cr of 1.26).   Medications:  Prescriptions prior to admission  Medication Sig Dispense Refill  . amLODipine (NORVASC) 5 MG tablet TAKE 1 TABLET BY MOUTH EVERY DAY  90 tablet  0  . calcium carbonate (TUMS - DOSED IN MG ELEMENTAL CALCIUM) 500 MG chewable tablet Chew 1 tablet by mouth 2 (two) times daily.      . ferrous sulfate 325 (65 FE) MG tablet Take 325 mg by mouth daily.      Marland Kitchen labetalol (NORMODYNE) 200 MG tablet Take 200 mg by mouth 2 (two) times daily.      Marland Kitchen levothyroxine (SYNTHROID) 125 MCG tablet Take 1 tablet (125 mcg total) by mouth daily.  90 tablet  0  . Polyvinyl Alcohol (LUBRICANT DROPS OP) Place 1 drop into both eyes 2 (two) times daily.      . ranitidine (ZANTAC) 150 MG tablet Take 1 tablet (150 mg total) by mouth at bedtime.  30 tablet  3  . ranitidine (ZANTAC) 150 MG tablet Take 150 mg by mouth at bedtime.      . risperiDONE (RISPERDAL) 2 MG tablet Take 2 mg by mouth 2 (two) times daily.      Marland Kitchen warfarin (COUMADIN) 5 MG tablet Take 5-7.5 mg by mouth daily. Take 5mg  on Tuesday, Wednesday, Friday, Saturday, and Sunday. Take 7.5 mg on Monday and Thursday.        Assessment: INR currently at goal.  No problems noted with therapy  Goal of Therapy:  INR 2-3    Plan:  Coumadin 5 mg x 1 today at 1800  PT/INR in am   Lenox Ponds 07/23/2014,7:32 AM

## 2014-07-23 NOTE — Progress Notes (Signed)
Patient ID: [name], female   DOB: 1971-01-10, 43 y.o.   MRN: [id number] [facility] MD Progress Note  07/23/2014 11:18 AM [name]  MRN:  [id number] Subjective: Patient states: I am not crazy " She was not irritable today.  Objective: Patient is seen lying in her bed. She denies prior history of mental illness until a couple of weeks ago when she found out in her vision that her husband was "bisexual". She states that she was admitted to [facility] for 9 days and was discharged on 07/18/14. Patient was found wandering on the streets telling people that she was a messenger of God and receiving messages from God. Today However, patient claimed that she is not delusional or psychosis, she states that she is just homeless. Patient was reported to be disorganized at admission but continues to show some improvement and less isolative and has not reported any adverse reactions.  Diagnosis:   DSM5: Schizophrenia Disorders:  Delusional Disorder (297.1) and Brief Psychotic Disorder (298.8) Obsessive-Compulsive Disorders:   Trauma-Stressor Disorders:   Substance/Addictive Disorders:   Depressive Disorders:  Disruptive Mood Dysregulation Disorder (296.99) Total Time spent with patient: 30 minutes  Axis I: Paranoid schizophrenia  Axis II: Cluster B Traits Axis III:  Past Medical History  Diagnosis Date  . Thyroid disease   . Fibroid tumor   . DVT (deep venous thrombosis) 09/30/12    in upper extremities b/l and LLE  . Anemia   . Thyroid nodule     s/p biopsy  . Graves disease   . Multinodular goiter 10/01/2012  . Lupus anticoagulant positive 10/02/2012  . Hypertension   . 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 Arnoldo Morale on 12/13/2012.  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 Her affect is blunt. Her affect is not labile. She is not agitated. Thought content is paranoid. Cognition and memory are normal. She does not express impulsivity.    Review of Systems  Constitutional: Negative.   HENT: Negative.   Eyes: Negative.   Respiratory: Negative.   Cardiovascular: Negative.   Gastrointestinal: Negative.   Genitourinary: Negative.   Musculoskeletal: Negative.   Skin: Negative.   Endo/Heme/Allergies: Negative.   Psychiatric/Behavioral: Positive for hallucinations. The patient has insomnia.     Blood pressure 126/89, pulse 96, temperature 98.9 F (37.2 C), temperature source Oral, resp. rate 17, height 5' 7.75" (1.721 m), weight 290 lb (131.543 kg), last menstrual period 09/18/2012.Body mass index is 44.41 kg/(m^2).  General Appearance: Disheveled  Eye Contact::  Good  Speech:  Pressured  Volume:  Increased  Mood:  Angry and Irritable  Affect:  Labile  Thought Process:  Circumstantial and Disorganized  Orientation:  Full (Time, Place, and Person)  Thought Content:  Hallucinations: Visual and Paranoid Ideation  Suicidal Thoughts:  No  Homicidal Thoughts:  No  Memory:  Immediate;   Fair Recent;   Fair Remote;   Fair  Judgement:  Impaired  Insight:  Lacking  Psychomotor Activity:  Increased  Concentration:  Poor  Recall:  K[place]  Language: Good  Akathisia:  No  Handed:  Right  AIMS (if indicated):     Assets:  Communication Skills  Sleep:  Number of Hours: 6.25   Musculoskeletal: Strength & Muscle Tone: within normal limits Gait & Station: normal Patient leans: N/A  Current Medications: Current Facility-Administered Medications  Medication Dose Route Frequency Provider Last Rate Last Dose  . alum & mag hydroxide-simeth (MAALOX/MYLANTA)  200-200-20 MG/5ML suspension 30 mL  30 mL Oral Q4H PRN Benjamine Mola, FNP      . amLODipine (NORVASC) tablet 5 mg  5 mg Oral Daily Mojeed Akintayo   5 mg at 07/23/14 0801  . benztropine (COGENTIN) tablet 0.5 mg  0.5 mg Oral BID Mojeed Akintayo   0.5 mg at 07/23/14 0801  . calcium carbonate (TUMS - dosed in mg elemental calcium) chewable tablet 200 mg of elemental calcium  1 tablet Oral BID Mojeed Akintayo   200 mg of elemental calcium at 07/23/14 0801  . famotidine (PEPCID) tablet 20 mg  20 mg Oral Daily Mojeed Akintayo   20 mg at 07/23/14 0801  . ferrous sulfate tablet 325 mg  325 mg Oral Daily Mojeed Akintayo   325 mg at 07/23/14 0801  . labetalol (NORMODYNE) tablet 200 mg  200 mg Oral BID Mojeed Akintayo   200 mg at 07/23/14 0801  . levothyroxine (SYNTHROID, LEVOTHROID) tablet 125 mcg  125 mcg Oral Daily Mojeed Akintayo   125 mcg at 07/23/14 0801  . magnesium hydroxide (MILK OF MAGNESIA) suspension 30 mL  30 mL Oral Daily PRN Benjamine Mola, FNP      . naproxen (NAPROSYN) tablet 500 mg  500 mg Oral BID WC Mojeed Akintayo   500 mg at 07/23/14 0801  . polyvinyl alcohol (LIQUIFILM TEARS) 1.4 % ophthalmic solution 1 drop  1 drop Both Eyes BID Mojeed Akintayo   1 drop at 07/20/14 1701  . risperiDONE (RISPERDAL) tablet 2 mg  2 mg Oral BID Mojeed Akintayo   2 mg at 07/23/14 0801  . traZODone (DESYREL) tablet 100 mg  100 mg Oral QHS PRN Mojeed Akintayo   100 mg at 07/22/14 2059  . warfarin (COUMADIN) tablet 5 mg  5 mg Oral ONCE-1800 Mojeed Akintayo      . Warfarin - Pharmacist Dosing Inpatient   Does not apply q1800 Minda Ditto, [facility]        Lab Results:  Results for orders placed during the hospital encounter of 07/19/14 (from the past 48 hour(s))  PROTIME-INR     Status: Abnormal   Collection Time    07/22/14  6:34 AM      Result Value Ref Range   Prothrombin Time 20.8 (*) 11.6 - 15.2 seconds   INR 1.79 (*) 0.00 - 1.49   Comment: Performed at [facility]      Status: Abnormal   Collection Time    07/23/14  5:00 AM      Result Value Ref Range   Prothrombin Time 23.7 (*) 11.6 - 15.2 seconds   INR 2.12 (*) 0.00 - 1.49   Comment: Performed at [facility]    Physical Findings: AIMS: Facial and Oral Movements Muscles of Facial Expression: None, normal Lips and Perioral Area: None, normal Jaw: None, normal Tongue: None, normal,Extremity Movements Upper (arms, wrists, hands, fingers): None, normal Lower (legs, knees, ankles, toes): None, normal, Trunk Movements Neck, shoulders, hips: None, normal, Overall Severity Severity of abnormal movements (highest score from questions above): None, normal Incapacitation due to abnormal movements: None, normal Patient's awareness of  abnormal movements (rate only patient's report): No Awareness, Dental Status Current problems with teeth and/or dentures?: No Does patient usually wear dentures?: No  CIWA:  CIWA-Ar Total: 2 COWS:  COWS Total Score: 3  Treatment Plan Summary: Daily contact with patient to assess and evaluate symptoms and progress in treatment Medication management  Plan:1. Admit for crisis management and stabilization. 2. Medication management to reduce current symptoms to base line and improve the patient's overall level of functioning: -Continue Risperdal 2mg  po bid for mood/delusions/psychosis -Continue Benztropine 0.5mg  po BID for EPS prevention 3. Treat health problems as indicated. 4. Develop treatment plan to decrease risk of relapse upon discharge and the need for readmission. 5. Psycho-social education regarding relapse prevention and self care. 6. Health care follow up as needed for medical problems.   Medical Decision Making Problem Points:  Established problem, worsening (2), Review of last therapy session (1) and Review of psycho-social stressors (1) Data Points:  Order [place] Assessment (2) Review or order clinical lab tests (1) Review of medication  regiment & side effects (2) Review of new medications or change in dosage (2)  I certify that inpatient services furnished can reasonably be expected to improve the patient's condition.   Merian Capron, MD 07/23/2014, 11:18 AM

## 2014-07-23 NOTE — Progress Notes (Signed)
Did not attended group 

## 2014-07-23 NOTE — BHH Group Notes (Signed)
[facility] Group Notes:  (Nursing/MHT/Case Management/Adjunct)  Date:  07/22/2014 Time:  9:00AM  Type of Therapy:  Self Inventory  Participation Level:  Active  Participation Quality:  Appropriate  Affect:  Appropriate  Cognitive:  Alert and Appropriate  Insight:  Good  Engagement in Group:  Engaged  Modes of Intervention:  Discussion  Summary of Progress/Problems:  Tretha Sciara 07/23/2014, 8:49 AM

## 2014-07-23 NOTE — B[facility] Group Notes (Signed)
[facility] LCSW Group Therapy  07/23/2014 3:46 PM  Type of Therapy:  Group Therapy  Participation Level:  Active  Participation Quality:  Appropriate and Sharing  Affect:  Appropriate  Cognitive:  Oriented  Insight:  Developing/Improving  Engagement in Therapy:  Engaged and Supportive  Modes of Intervention:  Discussion, Education, Exploration, Rapport Building and Socialization  Summary of Progress/Problems: Pt was able to participate in discussion about self-sabotaging behaviors.  Pt was able to give an example of a behavior as well as identify an effective coping skill.  Pt was supportive of others sharing in group.    Katha Hamming 07/23/2014, 3:46 PM

## 2014-07-23 NOTE — BHH Group Notes (Signed)
[facility] Group Notes:  (Nursing/MHT/Case Management/Adjunct)  Date:  07/22/2014  Time:  9:10AM  Type of Therapy:  Psychoeducational Skills  Participation Level:  Active  Participation Quality:  Appropriate  Affect:  Appropriate  Cognitive:  Alert and Appropriate  Insight:  Good  Engagement in Group:  Engaged  Modes of Intervention:  Discussion  Summary of Progress/Problems:  [name] 07/23/2014, 8:53 AM

## 2014-07-23 NOTE — BHH Group Notes (Signed)
[facility] Group Notes:  (Nursing/MHT/Case Management/Adjunct)  Date:  07/23/2014  Time:  9:00AM  Type of Therapy:  Self Inventory  Participation Level:  Active  Participation Quality:  Appropriate  Affect:  Appropriate  Cognitive:  Alert  Insight:  Good  Engagement in Group:  Engaged  Modes of Intervention:  Discussion  Summary of Progress/Problems: pt appropriate in group. Engaging in conversation at appropriate times  Tretha Sciara 07/23/2014, 12:24 PM

## 2014-07-23 NOTE — Progress Notes (Signed)
Pt is visible this evening on the unit. Reports having a good day after being able to go outside. Asking for meds promptly at 2100 as she is tired and ready for bed. Pt was given prn trazadone as no other meds scheduled at this time. Support and reassurance offered. Affect remains slightly flat, mood pleasant. She denies SI/HI/AVH and remains safe on the unit. No pain or physical complaints voiced. Jamie Kato

## 2014-07-23 NOTE — Progress Notes (Signed)
Patient ID: [name], female   DOB: 03-26-1971, 43 y.o.   MRN: [id number] D. Patient presents with depressed mood, affect flat again today. [name] has been cooperative, but remains isolative and guarded. She would not elaborate on thoughts, however she did state '' I just need God, he is my only support and I'm sad that I'm in here and I'm missing church. God is the only one there for me, I don't need to be in here.'' Pt would not elaborate - however patient very religiously focused. A. Support and encouragement provided. Discussed patients progress with Dr. Olena Heckle. Medications given as ordered. R. Patient is in no acute distress at this time. Will continue to monitor q 15 minutes for safety.

## 2014-07-24 LAB — PROTIME-INR
INR: 2.16 — AB (ref 0.00–1.49)
PROTHROMBIN TIME: 24.1 s — AB (ref 11.6–15.2)

## 2014-07-24 MED ORDER — WARFARIN SODIUM 5 MG PO TABS
5.0000 mg | ORAL_TABLET | Freq: Once | ORAL | Status: AC
  Administered 2014-07-24: 5 mg via ORAL
  Filled 2014-07-24: qty 1

## 2014-07-24 NOTE — Progress Notes (Signed)
D: Pt reports her biggest concern today is where she will go once discharged.  She currently denies depression or anxiety.  States she is eating and sleeping.  A: Support/encouragment given.  R: Pt. Remains safe. Contracts for safety.

## 2014-07-24 NOTE — Progress Notes (Signed)
Pt has slept all evening however did get up briefly for a snack. States she has a good day but does not offer much more info than that. Affect flat, mood depressed. Supported, encouraged. Medicated with trazadone prn which was effected. Denies SI/HI/AVH and remains safe. Jamie Kato

## 2014-07-24 NOTE — Progress Notes (Signed)
ANTICOAGULATION CONSULT NOTE - Follow Up Consult  Pharmacy Consult for Coumadin Indication: h/o dvt  No Known Allergies  Patient Measurements: Height: 5' 7.75" (172.1 cm) Weight: 290 lb (131.543 kg) IBW/kg (Calculated) : 63.33     Labs:  Recent Labs  07/22/14 0634 07/23/14 0500 07/24/14 0614  LABPROT 20.8* 23.7* 24.1*  INR 1.79* 2.12* 2.16*    Estimated Creatinine Clearance: 80.6 ml/min (by C-G formula based on Cr of 1.26).   Medications:  Scheduled:  . amLODipine  5 mg Oral Daily  . benztropine  0.5 mg Oral BID  . calcium carbonate  1 tablet Oral BID  . famotidine  20 mg Oral Daily  . ferrous sulfate  325 mg Oral Daily  . labetalol  200 mg Oral BID  . levothyroxine  125 mcg Oral Daily  . naproxen  500 mg Oral BID WC  . polyvinyl alcohol  1 drop Both Eyes BID  . risperiDONE  2 mg Oral BID  . warfarin  5 mg Oral ONCE-1800  . Warfarin - Pharmacist Dosing Inpatient   Does not apply q1800    Assessment: INR at goal, no problems noted   Goal of Therapy:  INR 2-3    Plan:  Coumadin 5mg  x 1  PT/INR in am    Lenox Ponds 07/24/2014,7:25 AM

## 2014-07-24 NOTE — B[facility] Group Notes (Signed)
[facility] LCSW Group Therapy  07/24/2014 1:15 pm  Type of Therapy: Process Group Therapy  Participation Level:  Active  Participation Quality:  Appropriate  Affect:  Flat  Cognitive:  Oriented  Insight:  Improving  Engagement in Group:  Limited  Engagement in Therapy:  Limited  Modes of Intervention:  Activity, Clarification, Education, Problem-solving and Support  Summary of Progress/Problems: Today's group addressed the issue of overcoming obstacles.  Patients were asked to identify their biggest obstacle post d/c that stands in the way of their on-going success, and then problem solve as to how to manage this.  Talked about needing to "start over," and how life is full of transitions.  She is determined to land on her feet and do well, and cited her experience in the [place] as good training to be able to take on adversity.  She sees everything that happens now as temporary as she is confident that she will be getting money soon from the [place] for disability.  Still planning on going to the shelter, and makes no mention of returning to [facility].  Trish Mage 07/24/2014   2:51 PM

## 2014-07-24 NOTE — Progress Notes (Signed)
Patient ID: [name], female   DOB: 26-Apr-1971, 43 y.o.   MRN: [id number] [facility] MD Progress Note  07/24/2014 11:32 AM [name]  MRN:  [id number] Subjective: Patient states: " I think I am doing better, I have been sleeping and thinking clearly since I started taking my medications regularly.''  Objective: Patient was seen and her chart was reviewed. Patient reports that she has been doing well on her medications. She reports decreased delusional thinking or psychosis. Her thought process is more organized and she has been behaving less bizarre.  Patient has been attending the unit milieu, compliant with her medications and denies any adverse reactions. Diagnosis:   DSM5: Schizophrenia Disorders:  Delusional Disorder (297.1) and Brief Psychotic Disorder (298.8) Obsessive-Compulsive Disorders:   Trauma-Stressor Disorders:   Substance/Addictive Disorders:   Depressive Disorders:  Disruptive Mood Dysregulation Disorder (296.99) Total Time spent with patient: 30 minutes  Axis I: Paranoid schizophrenia  Axis II: Cluster B Traits Axis III:  Past Medical History  Diagnosis Date  . Thyroid disease   . Fibroid tumor   . DVT (deep venous thrombosis) 09/30/12    in upper extremities b/l and LLE  . Anemia   . Thyroid nodule     s/p biopsy  . Graves disease   . Multinodular goiter 10/01/2012  . Lupus anticoagulant positive 10/02/2012  . Hypertension   . 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 Arnoldo Morale on 12/13/2012.  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 Her speech is  normal. Her affect is not labile. She is not agitated. Thought content is paranoid. Cognition and memory are normal. She does not express impulsivity.    Review of Systems  Constitutional: Negative.   HENT: Negative.   Eyes: Negative.   Respiratory: Negative.   Cardiovascular: Negative.   Gastrointestinal: Negative.   Genitourinary: Negative.   Musculoskeletal: Negative.   Skin: Negative.   Endo/Heme/Allergies: Negative.   Psychiatric/Behavioral: Positive for hallucinations.    Blood pressure 129/77, pulse 77, temperature 97.9 F (36.6 C), temperature source Oral, resp. rate 18, height 5' 7.75" (1.721 m), weight 131.543 kg (290 lb), last menstrual period 09/18/2012.Body mass index is 44.41 kg/(m^2).  General Appearance: Disheveled  Eye Contact::  Good  Speech:  Pressured  Volume:  Increased  Mood:  Angry and Irritable  Affect:  Labile  Thought Process:  Circumstantial and Disorganized  Orientation:  Full (Time, Place, and Person)  Thought Content:  Hallucinations: Visual and Paranoid Ideation  Suicidal Thoughts:  No  Homicidal Thoughts:  No  Memory:  Immediate;   Fair Recent;   Fair Remote;   Fair  Judgement:  Impaired  Insight:  Lacking  Psychomotor Activity:  Increased  Concentration:  Poor  Recall:  [place]asio  Language: Good  Akathisia:  No  Handed:  Right  AIMS (if indicated):     Assets:  Communication Skills  Sleep:  Number of Hours: 6   Musculoskeletal: Strength & Muscle Tone: within normal limits Gait & Station: normal Patient leans: N/A  Current Medications: Current Facility-Administered Medications  Medication Dose Route Frequency Provider  Last Rate Last Dose  . alum & mag hydroxide-simeth (MAALOX/MYLANTA) 200-200-20 MG/5ML suspension 30 mL  30 mL Oral Q4H PRN Benjamine Mola, FNP      . amLODipine (NORVASC) tablet 5 mg  5 mg Oral Daily Indiyah Paone   5 mg at 07/24/14 0746  . benztropine (COGENTIN) tablet 0.5 mg  0.5 mg Oral BID  Xavior Niazi   0.5 mg at 07/24/14 0746  . calcium carbonate (TUMS - dosed in mg elemental calcium) chewable tablet 200 mg of elemental calcium  1 tablet Oral BID Maimouna Rondeau   200 mg of elemental calcium at 07/24/14 0800  . famotidine (PEPCID) tablet 20 mg  20 mg Oral Daily Donalyn Schneeberger   20 mg at 07/24/14 0745  . ferrous sulfate tablet 325 mg  325 mg Oral Daily Geremiah Fussell   325 mg at 07/24/14 0746  . labetalol (NORMODYNE) tablet 200 mg  200 mg Oral BID Fontaine Hehl   200 mg at 07/24/14 0745  . levothyroxine (SYNTHROID, LEVOTHROID) tablet 125 mcg  125 mcg Oral Daily Ayari Liwanag   125 mcg at 07/24/14 0745  . magnesium hydroxide (MILK OF MAGNESIA) suspension 30 mL  30 mL Oral Daily PRN Benjamine Mola, FNP      . naproxen (NAPROSYN) tablet 500 mg  500 mg Oral BID WC Makaylie Dedeaux   500 mg at 07/24/14 0745  . polyvinyl alcohol (LIQUIFILM TEARS) 1.4 % ophthalmic solution 1 drop  1 drop Both Eyes BID Stacy Deshler   1 drop at 07/23/14 1633  . risperiDONE (RISPERDAL) tablet 2 mg  2 mg Oral BID Kinslie Hove   2 mg at 07/24/14 0745  . traZODone (DESYREL) tablet 100 mg  100 mg Oral QHS PRN Vashaun Osmon   100 mg at 07/23/14 2215  . warfarin (COUMADIN) tablet 5 mg  5 mg Oral ONCE-1800 Jazmin Vensel      . Warfarin - Pharmacist Dosing Inpatient   Does not apply q1800 Minda Ditto, [facility]        Lab Results:  Results for orders placed during the hospital encounter of 07/19/14 (from the past 48 hour(s))  PROTIME-INR     Status: Abnormal   Collection Time    07/23/14  5:00 AM      Result Value Ref Range   Prothrombin Time 23.7 (*) 11.6 - 15.2 seconds   INR 2.12 (*) 0.00 - 1.49   Comment: Performed at [facility]     Status: Abnormal   Collection Time    07/24/14  6:14 AM      Result Value Ref Range   Prothrombin Time 24.1 (*) 11.6 - 15.2 seconds   INR 2.16 (*) 0.00 - 1.49   Comment: Performed at [facility]     Physical Findings: AIMS: Facial and Oral Movements Muscles of Facial Expression: None, normal Lips and Perioral Area: None, normal Jaw: None, normal Tongue: None, normal,Extremity Movements Upper (arms, wrists, hands, fingers): None, normal Lower (legs, knees, ankles, toes): None, normal, Trunk Movements Neck, shoulders, hips: None, normal, Overall Severity Severity of abnormal movements (highest score from questions above): None, normal Incapacitation due to abnormal movements: None, normal Patient's awareness of abnormal movements (rate only patient's report): No Awareness, Dental Status Current problems with teeth and/or dentures?: No Does patient usually wear dentures?: No  CIWA:  CIWA-Ar Total: 2 COWS:  COWS Total Score: 3  Treatment Plan Summary: Daily contact with patient to assess and evaluate  symptoms and progress in treatment Medication management  Plan:1. Admit for crisis management and stabilization. 2. Medication management to reduce current symptoms to base line and improve the patient's overall level of functioning: -Continue Risperdal 2mg  po bid for mood/delusions/psychosis -Continue Benztropine 0.5mg  po BID for EPS prevention 3. Treat health problems as indicated. 4. Develop treatment plan to decrease risk of relapse upon discharge and the need for readmission. 5. Psycho-social education regarding relapse prevention and self care. 6. Health care follow up as needed for medical problems.   Medical Decision Making Problem Points:  Established problem, worsening (2), Review of last therapy session (1) and Review of psycho-social stressors (1) Data Points:  Order Aims Assessment (2) Review or order clinical lab tests (1) Review of medication regiment & side effects (2) Review of new medications or change in dosage (2)  I certify that inpatient services furnished can reasonably be expected to improve the patient's condition.   Corena Pilgrim, MD 07/24/2014,  11:32 AM

## 2014-07-24 NOTE — Progress Notes (Signed)
Patient ID: [name], female   DOB: 27-Aug-1971, 43 y.o.   MRN: [id number] D  ---  Pt. Denies pain or dis-comfort at this time.   She is friendly and receptive to staff  And interacts well with peers.   Pt. Maintains a calm , quiet affect  And  Sits calmly in dayroom watching TV.     Pt. Accepts all medications  Without issue and appears vested in treatment.   She shows no negative behaviors at this time.   A  Support and safety cks .   R  --  Pt. Remains calm and safe  On unit

## 2014-07-24 NOTE — Progress Notes (Signed)
Adult Psychoeducational Group Note  Date:  07/24/2014 Time:  9:32 PM  Group Topic/Focus:  Wrap-Up Group:   The focus of this group is to help patients review their daily goal of treatment and discuss progress on daily workbooks.  Participation Level:  Minimal  Participation Quality:  Minimal  Affect:  Flat  Cognitive:  Appropriate  Insight: Limited  Engagement in Group:  Limited  Modes of Intervention:  Socialization and Support  Additional Comments:  Patient attended and participated in group tonight. She reports having a good day. She received rest and is feeling better. She say her Education officer, museum today. They talk about placement for her upon discharge. Today she went for her meals and attended groups as well.  Salley Scarlet [facility] 07/24/2014, 9:32 PM

## 2014-07-24 NOTE — BHH Group Notes (Signed)
[facility] LCSW Aftercare Discharge Planning Group Note   07/24/2014 10:05 AM  Participation Quality:  Engaged  Mood/Affect:  Appropriate  Depression Rating:  denies  Anxiety Rating:  denies  Thoughts of Suicide:  No Will you contract for safety?   NA  Current AVH:  No  Plan for Discharge/Comments:  [name] has no complaints.  She is doing well.  States her plan is still to go to a shelter here in [place] or [facility] [employer].  Had the number for [facility] with her, and said she would call them after group.   Transportation Means:   Supports:  [name]

## 2014-07-25 LAB — PROTIME-INR
INR: 2.1 — AB (ref 0.00–1.49)
Prothrombin Time: 23.6 seconds — ABNORMAL HIGH (ref 11.6–15.2)

## 2014-07-25 MED ORDER — WARFARIN SODIUM 7.5 MG PO TABS
7.5000 mg | ORAL_TABLET | Freq: Once | ORAL | Status: AC
  Administered 2014-07-25: 7.5 mg via ORAL
  Filled 2014-07-25: qty 1

## 2014-07-25 NOTE — Progress Notes (Signed)
ANTICOAGULATION CONSULT NOTE - Follow Up Consult  Pharmacy Consult for Coumadin Indication: H/O dvt  No Known Allergies  Patient Measurements: Height: 5' 7.75" (172.1 cm) Weight: 290 lb (131.543 kg) IBW/kg (Calculated) : 63.33   Vital Signs: Temp: 97.7 F (36.5 C) (07/28 0624) Temp src: Oral (07/28 0624) BP: 124/77 mmHg (07/28 0625) Pulse Rate: 84 (07/28 0625)  Labs:  Recent Labs  07/23/14 0500 07/24/14 0614 07/25/14 0609  LABPROT 23.7* 24.1* 23.6*  INR 2.12* 2.16* 2.10*    Estimated Creatinine Clearance: 80.6 ml/min (by C-G formula based on Cr of 1.26).   Medications:  Scheduled:  . amLODipine  5 mg Oral Daily  . benztropine  0.5 mg Oral BID  . calcium carbonate  1 tablet Oral BID  . famotidine  20 mg Oral Daily  . ferrous sulfate  325 mg Oral Daily  . labetalol  200 mg Oral BID  . levothyroxine  125 mcg Oral Daily  . naproxen  500 mg Oral BID WC  . polyvinyl alcohol  1 drop Both Eyes BID  . risperiDONE  2 mg Oral BID  . warfarin  7.5 mg Oral ONCE-1800  . Warfarin - Pharmacist Dosing Inpatient   Does not apply q1800    Assessment: INR at goal Goal of Therapy:  INR 2-3    Plan:  Coumadin 7.5 mg x 1 today  PT/INR in am   Lenox Ponds 07/25/2014,12:09 PM

## 2014-07-25 NOTE — Progress Notes (Signed)
Patient ID: [name], female   DOB: [birth year], 43 y.o.   MRN: [id number]  [facility] MD Progress Note  07/25/2014 11:31 AM TRISTA CIOCCA  MRN:  [id number] Subjective: Patient states: "I feel a lot of happier. I got a lot of rest while I was here. I feel good on the medication. I believe it is helping me to stay stable."   Objective: Patient was seen and her chart was reviewed. Patient reports that she has been doing well on her medications. She reports decreased delusional thinking or psychosis. Her thought process is more organized and she has been behaving less bizarre.  Patient has been attending the unit milieu, compliant with her medications and denies any adverse reactions. She has been checking the availability of the local shelter. Patient reports her goal is to get a job and eventually an apartment.   Diagnosis:   DSM5: Schizophrenia Disorders:  Delusional Disorder (297.1) and Brief Psychotic Disorder (298.8) Obsessive-Compulsive Disorders:   Trauma-Stressor Disorders:   Substance/Addictive Disorders:   Depressive Disorders:  Disruptive Mood Dysregulation Disorder (296.99) Total Time spent with patient: 15 minutes  Axis I: Paranoid schizophrenia  Axis II: Cluster B Traits Axis III:  Past Medical History  Diagnosis Date  . Thyroid disease   . Fibroid tumor   . DVT (deep venous thrombosis) 09/30/12    in upper extremities b/l and LLE  . Anemia   . Thyroid nodule     s/p biopsy  . Graves disease   . Multinodular goiter 10/01/2012  . Lupus anticoagulant positive 10/02/2012  . Hypertension   . 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 Arnoldo Morale on 12/13/2012.  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 Thought content normal. Cognition and memory are normal.    Review of Systems  Constitutional: Negative.   HENT: Negative.   Eyes: Negative.   Respiratory: Negative.   Cardiovascular: Negative.   Gastrointestinal: Negative.   Genitourinary: Negative.   Musculoskeletal: Negative.   Skin: Negative.   Endo/Heme/Allergies: Negative.   Psychiatric/Behavioral: Negative for hallucinations. The patient is nervous/anxious.     Blood pressure 124/77, pulse 84, temperature 97.7 F (36.5 C), temperature source Oral, resp. rate 18, height 5' 7.75" (1.721 m), weight 131.543 kg (290 lb), last menstrual period 09/18/2012.Body mass index is 44.41 kg/(m^2).  General Appearance: Casual  Eye Contact::  Good  Speech:  Pressured  Volume:  Increased  Mood:  Euthymic  Affect:  Appropriate  Thought Process:  Coherent  Orientation:  Full (Time, Place, and Person)  Thought Content:  WDL  Suicidal Thoughts:  No  Homicidal Thoughts:  No  Memory:  Immediate;   Fair Recent;   Fair Remote;   Fair  Judgement:  Impaired  Insight:  Lacking  Psychomotor Activity:  Normal  Concentration:  Good  Recall:  [place] of Knowledge:Fair  Language: Good  Akathisia:  No  Handed:  Right  AIMS (if indicated):     Assets:  Communication Skills Desire for Improvement Leisure Time Physical Health Resilience  Sleep:  Number of Hours: 5.25   Musculoskeletal: Strength &  Muscle Tone: within normal limits Gait & Station: normal Patient leans: N/A  Current Medications: Current Facility-Administered Medications  Medication Dose Route Frequency Provider Last Rate Last Dose  . alum & mag hydroxide-simeth (MAALOX/MYLANTA) 200-200-20 MG/5ML suspension 30 mL  30 mL Oral Q4H PRN Benjamine Mola, FNP      . amLODipine (NORVASC) tablet 5 mg  5 mg Oral Daily Blue Ruggerio   5  mg at 07/25/14 0748  . benztropine (COGENTIN) tablet 0.5 mg  0.5 mg Oral BID Taci Sterling   0.5 mg at 07/25/14 0748  . calcium carbonate (TUMS - dosed in mg elemental calcium) chewable tablet 200 mg of elemental calcium  1 tablet Oral BID Kataleia Quaranta   200 mg of elemental calcium at 07/25/14 0748  . famotidine (PEPCID) tablet 20 mg  20 mg Oral Daily Ainhoa Rallo   20 mg at 07/25/14 0749  . ferrous sulfate tablet 325 mg  325 mg Oral Daily Annya Lizana   325 mg at 07/25/14 0749  . labetalol (NORMODYNE) tablet 200 mg  200 mg Oral BID Darlette Dubow   200 mg at 07/25/14 0749  . levothyroxine (SYNTHROID, LEVOTHROID) tablet 125 mcg  125 mcg Oral Daily Quashawn Jewkes   125 mcg at 07/25/14 0630  . magnesium hydroxide (MILK OF MAGNESIA) suspension 30 mL  30 mL Oral Daily PRN Benjamine Mola, FNP      . naproxen (NAPROSYN) tablet 500 mg  500 mg Oral BID WC Emila Steinhauser   500 mg at 07/25/14 0748  . polyvinyl alcohol (LIQUIFILM TEARS) 1.4 % ophthalmic solution 1 drop  1 drop Both Eyes BID Joycelyn Liska   1 drop at 07/24/14 1634  . risperiDONE (RISPERDAL) tablet 2 mg  2 mg Oral BID Satoshi Kalas   2 mg at 07/25/14 0748  . traZODone (DESYREL) tablet 100 mg  100 mg Oral QHS PRN Catelynn Sparger   100 mg at 07/23/14 2215  . Warfarin - Pharmacist Dosing Inpatient   Does not apply q1800 Minda Ditto, [facility]        Lab Results:  Results for orders placed during the hospital encounter of 07/19/14 (from the past 48 hour(s))  PROTIME-INR     Status: Abnormal   Collection Time    07/24/14  6:14 AM      Result Value Ref Range   Prothrombin Time 24.1 (*) 11.6 - 15.2 seconds   INR 2.16 (*) 0.00 - 1.49   Comment: Performed at [facility]     Status: Abnormal   Collection Time    07/25/14  6:09 AM      Result Value Ref Range   Prothrombin Time 23.6 (*) 11.6 - 15.2 seconds   INR 2.10 (*) 0.00 - 1.49   Comment: Performed at [facility]     Physical Findings: AIMS: Facial and Oral Movements Muscles of Facial Expression: None, normal Lips and Perioral Area: None, normal Jaw: None, normal Tongue: None, normal,Extremity Movements Upper (arms, wrists, hands, fingers): None, normal Lower (legs, knees, ankles, toes): None, normal, Trunk Movements Neck, shoulders, hips: None, normal, Overall Severity Severity of abnormal movements (highest score from questions above): None, normal Incapacitation due to abnormal movements: None, normal Patient's awareness of abnormal movements (rate only patient's report): No Awareness, Dental Status Current problems with teeth and/or dentures?: No Does patient usually wear dentures?: No  CIWA:  CIWA-Ar Total: 2 COWS:  COWS Total Score: 3  Treatment Plan Summary: Daily contact  with patient to assess and evaluate symptoms and progress in treatment Medication management  Plan:1. Continue crisis management and stabilization. 2. Medication management to reduce current symptoms to base line and improve the patient's overall level of functioning: -Continue Risperdal 2mg  po bid for mood/delusions/psychosis -Continue Benztropine 0.5mg  po BID for EPS prevention 3. Treat health problems as indicated. 4. Develop treatment plan to decrease risk of relapse upon discharge and the need for readmission. 5. Psycho-social education regarding relapse prevention and self care. 6. Address health issues: Continue pharmacy dosing of Coumadin for history of DVT, home medications for Hypertension/Hypothyroidism.  7. Anticipate discharge tomorrow.   Medical Decision Making Problem Points:  Established problem, stable/improving (1) and Review of psycho-social stressors (1) Data Points:  Order Aims Assessment (2) Review or order clinical lab tests (1) Review of medication regiment & side effects (2)  I certify that inpatient services furnished can reasonably be expected to improve the patient's condition.    Elmarie Shiley, NP-C 07/25/2014, 11:31 AM  Patient seen, evaluated and I agree with notes by Nurse Practitioner. Corena Pilgrim, MD

## 2014-07-25 NOTE — Progress Notes (Signed)
D: Pt in bed resting with eyes closed. Respirations even and unlabored. Pt appears to be in no signs of distress at this time. A: Q15min checks remains for this pt. R: Pt remains safe at this time.   

## 2014-07-25 NOTE — Progress Notes (Signed)
D: Pt is somewhat blunted but pleasant and cooperative with staff. She is taking medications as scheduled.  Pt. Is concerned about discharge placement. A: Support/encouragement given. R: Pt. Receptive, remains safe. Denies SI/HI.

## 2014-07-25 NOTE — Progress Notes (Signed)
Patient ID: [name], female   DOB: 03-Sep-1971, 43 y.o.   MRN: [id number] Pt visible in the milieu.  Interacting appropriately with staff and peers.  Attending and participating in group.  Needs assessed.  Pt denied.  Fifteen minute checks continue for patient safety.  Pt safe on unit.

## 2014-07-25 NOTE — Tx Team (Signed)
  Interdisciplinary Treatment Plan Update   Date Reviewed:  07/25/2014  Time Reviewed:  8:34 AM  Progress in Treatment:   Attending groups: Yes Participating in groups: Yes Taking medication as prescribed: Yes  Tolerating medication: Yes Family/Significant other contact made: Yes  Patient understands diagnosis: Yes  Discussing patient identified problems/goals with staff: Yes Medical problems stabilized or resolved: Yes Denies suicidal/homicidal ideation: Yes Patient has not harmed self or others: Yes  For review of initial/current patient goals, please see plan of care.  Estimated Length of Stay:  1-3 days  Reason for Continuation of Hospitalization: Medication stabilization  New Problems/Goals identified:  N/A  Discharge Plan or Barriers:   Pt is checking local shelters.  States she will not return to [facility]  Additional Comments:  Patient states: " I think I am doing better, I have been sleeping and thinking clearly since I started taking my medications regularly.''   Patient reports that she has been doing well on her medications. She reports decreased delusional thinking or psychosis. Her thought process is more organized and she has been behaving less bizarre. Patient has been attending the unit milieu, compliant with her medications and denies any adverse reactions.   Attendees:  Signature: Corena Pilgrim, MD 07/25/2014 8:34 AM   Signature: Ripley Fraise, LCSW 07/25/2014 8:34 AM  Signature: Elmarie Shiley, NP 07/25/2014 8:34 AM  Signature: Mayra Neer, RN 07/25/2014 8:34 AM  Signature: Darrol Angel, RN 07/25/2014 8:34 AM  Signature:  07/25/2014 8:34 AM  Signature:   07/25/2014 8:34 AM  Signature:    Signature:    Signature:    Signature:    Signature:    Signature:      Scribe for Treatment Team:   Ripley Fraise, LCSW  07/25/2014 8:34 AM

## 2014-07-25 NOTE — BH Assessment (Signed)
D: Pt in bed resting with eyes closed. Respirations even and unlabored. Pt appears to be in no signs of distress at this time. A: Q15min checks remains for this pt. R: Pt remains safe at this time.   

## 2014-07-25 NOTE — BHH Group Notes (Signed)
[place] LCSW Group Therapy  07/25/2014 2:38 PM   Type of Therapy:  Group Therapy  Participation Level:  Active  Participation Quality:  Attentive  Affect:  Appropriate  Cognitive:  Appropriate  Insight:  Improving  Engagement in Therapy:  Engaged  Modes of Intervention:  Clarification, Education, Exploration and Socialization  Summary of Progress/Problems: Today's group focused on relapse prevention.  We defined the term, and then brainstormed on ways to prevent relapse.  [name] focused on relapse related to mental health, but also minimized that as an issue for herself.  When talking about coping skills, stated she was part of a tight knit group when she was in the [employer], and so knows how helpful that can be.  Will try to find a group at d/c.  Roque Lias B 07/25/2014 , 2:38 PM

## 2014-07-25 NOTE — Progress Notes (Signed)
Adult Psychoeducational Group Note  Date:  07/25/2014 Time:  11:08 AM  Group Topic/Focus:  Recovery Goals:   The focus of this group is to identify appropriate goals for recovery and establish a plan to achieve them.  Participation Level:  Minimal  Participation Quality:  Attentive  Affect:  Flat  Cognitive:  Appropriate  Insight: Lacking  Engagement in Group:  Limited  Modes of Intervention:  Discussion and Education  Additional Comments:  Pt was present for group this morning but she participated minimally.  Brance Dartt E 07/25/2014, 11:08 AM

## 2014-07-25 NOTE — Progress Notes (Signed)
Adult Psychoeducational Group Note  Date:  07/25/2014 Time:  9:57 PM  Group Topic/Focus:  Wrap-Up Group:   The focus of this group is to help patients review their daily goal of treatment and discuss progress on daily workbooks.  Participation Level:  Minimal  Participation Quality:  Appropriate  Affect:  Flat  Cognitive:  Appropriate  Insight: Improving  Engagement in Group:  Limited  Modes of Intervention:  Socialization and Support  Additional Comments:  Patient attended and participated in group tonight. She reports having a good day. She is scheduled to be discharge tomorrow. Last night she slept well without medication. For her recovery she plans to continue taking her medication.  Salley Scarlet [facility] 07/25/2014, 9:57 PM

## 2014-07-26 LAB — PROTIME-INR
INR: 2.38 — ABNORMAL HIGH (ref 0.00–1.49)
Prothrombin Time: 26 seconds — ABNORMAL HIGH (ref 11.6–15.2)

## 2014-07-26 MED ORDER — AMLODIPINE BESYLATE 5 MG PO TABS: 5.0000 mg | ORAL_TABLET | Freq: Every day | ORAL | Status: DC

## 2014-07-26 MED ORDER — RISPERIDONE 2 MG PO TABS: 2.0000 mg | ORAL_TABLET | Freq: Two times a day (BID) | ORAL | Status: DC

## 2014-07-26 MED ORDER — TRAZODONE HCL 100 MG PO TABS: 100.0000 mg | ORAL_TABLET | Freq: Every evening | ORAL | Status: DC | PRN

## 2014-07-26 MED ORDER — FERROUS SULFATE 325 (65 FE) MG PO TABS: 325.0000 mg | ORAL_TABLET | Freq: Every day | ORAL | Status: DC

## 2014-07-26 MED ORDER — LEVOTHYROXINE SODIUM 125 MCG PO TABS: 125.0000 ug | ORAL_TABLET | Freq: Every day | ORAL | Status: DC

## 2014-07-26 MED ORDER — RANITIDINE HCL 150 MG PO TABS: 150.0000 mg | ORAL_TABLET | Freq: Every day | ORAL | Status: DC

## 2014-07-26 MED ORDER — POLYVINYL ALCOHOL 1.4 % OP SOLN: 1.0000 [drp] | OPHTHALMIC | Status: DC | PRN

## 2014-07-26 MED ORDER — CALCIUM CARBONATE ANTACID 500 MG PO CHEW: 1.0000 | CHEWABLE_TABLET | Freq: Two times a day (BID) | ORAL | Status: DC

## 2014-07-26 MED ORDER — WARFARIN SODIUM 5 MG PO TABS: 5.0000 mg | ORAL_TABLET | Freq: Every day | ORAL | Status: DC

## 2014-07-26 MED ORDER — BENZTROPINE MESYLATE 0.5 MG PO TABS: 0.5000 mg | ORAL_TABLET | Freq: Two times a day (BID) | ORAL | Status: DC

## 2014-07-26 MED ORDER — LABETALOL HCL 200 MG PO TABS: 200.0000 mg | ORAL_TABLET | Freq: Two times a day (BID) | ORAL | Status: DC

## 2014-07-26 NOTE — Discharge Summary (Signed)
Physician Discharge Summary Note  Patient:  [name] is an 43 y.o., female MRN:  [id number] DOB:  12-Jun-1971 Patient phone:  [phone] (home)  Patient address:   [address] [address],  Total Time spent with patient: 20 minutes  Date of Admission:  07/19/2014 Date of Discharge: 07/26/14  Reason for Admission:  Psychosis   Discharge Diagnoses: Principal Problem:   Paranoid schizophrenia Active Problems:   Psychosis   Delusional disorder(297.1)   Psychiatric Specialty Exam: Physical Exam  Review of Systems  Constitutional: Negative.   HENT: Negative.   Eyes: Negative.   Respiratory: Negative.   Cardiovascular: Negative.   Gastrointestinal: Negative.   Genitourinary: Negative.   Musculoskeletal: Negative.   Skin: Negative.   Neurological: Negative.   Endo/Heme/Allergies: Negative.   Psychiatric/Behavioral: Negative.     Blood pressure 111/79, pulse 72, temperature 97.9 F (36.6 C), temperature source Oral, resp. rate 18, height 5' 7.75" (1.721 m), weight 131.543 kg (290 lb), last menstrual period 09/18/2012.Body mass index is 44.41 kg/(m^2).  See Physician SRA                                                  Past Psychiatric History: See H&P Diagnosis:  Hospitalizations:  Outpatient Care:  Substance Abuse Care:  Self-Mutilation:  Suicidal Attempts:  Violent Behaviors:   Musculoskeletal: Strength & Muscle Tone: within normal limits Gait & Station: normal Patient leans: N/A  DSM5: AXIS I: Paranoid schizophrenia  AXIS II: Cluster B Traits  AXIS III:  Past Medical History   Diagnosis  Date   .  Thyroid disease    .  Fibroid tumor    .  DVT (deep venous thrombosis)  09/30/12     in upper extremities b/l and LLE   .  Anemia    .  Thyroid nodule      s/p biopsy   .  Graves disease    .  Multinodular goiter  10/01/2012   .  Lupus anticoagulant positive  10/02/2012   .  Hypertension    .  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 Arnoldo Morale on 12/13/2012.   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 She was recently discharged from [facility] on [facility]/18/14. The patient made comments about being able to receive messages from God on a daily basis. Patient states today during her psychiatric admission assessment "I don't understand why I am here. Why am I in a psychiatric hospital? I am homeless. I'm not crazy. I don't need any help or medications. I'm not delusional or Bipolar. I could not live with my father so I just started walking. I found out my husband was bi-sexual. There is nothing wrong with me. I was not harassing anyone. I just live for the [place]. I did say things about being a messenger." Her story regarding her husband was not consistent. Patient reports "hearing" from  someone that he died. Then patient proceeds to provide treatment team with contact information for his place of employment. The patient angry about people allowing her to marry her husband due to him being "bisexual" and about being in the hospital. She denies that [place] gave her any medications but Risperdal is listed on the prior to admission form.          [name] was admitted to the adult 400 unit where she was evaluated and her symptoms were identified. Medication management was discussed and implemented. Patient was started on Risperdal 2 mg BID for psychosis.  She was encouraged to participate in unit programming. Medical problems were identified and treated appropriately. Her coumadin dosing was monitored by the pharmacy on a daily  basis. Home medication was restarted as needed. She was evaluated each day by a clinical provider to ascertain the patient's response to treatment.  Improvement was noted by the patient's report of decreasing symptoms, improved sleep and appetite, affect, medication tolerance, behavior, and participation in unit programming.  The patient was asked each day to complete a self inventory noting mood, mental status, pain, new symptoms, anxiety and concerns.         She responded well to medication and being in a therapeutic and supportive environment. Her father and husband came to visit the patient. Patient remained delusional about her husband having AIDS and being bi-sexual.  Positive and appropriate behavior was noted and the patient was motivated for recovery.  She worked closely with the treatment team and case manager to develop a discharge plan with appropriate goals. Coping skills, problem solving as well as relaxation therapies were also part of the unit programming.         By the day of discharge she was in much improved condition than upon admission.  Symptoms were reported as significantly decreased or resolved completely.  The patient denied SI/HI and voiced no AVH. She was motivated to continue taking medication with a goal of continued improvement in mental health.  [name] was discharged home with a plan to follow up as noted below.  Consults:  None  Significant Diagnostic Studies:  Admission labs reviewed, Daily PT/INR  Discharge Vitals:   Blood pressure 111/79, pulse 72, temperature 97.9 F (36.6 C), temperature source Oral, resp. rate 18, height 5' 7.75" (1.721 m), weight 131.543 kg (290 lb), last menstrual period 09/18/2012. Body mass index is 44.41 kg/(m^2). Lab Results:   Results for orders placed during the hospital encounter of 07/19/14 (from the past 72 hour(s))  PROTIME-INR     Status: Abnormal   Collection Time    07/24/14  6:14 AM      Result Value Ref Range    Prothrombin Time 24.1 (*) 11.6 - 15.2 seconds   INR 2.16 (*) 0.00 - 1.49   Comment: Performed at [facility]     Status: Abnormal   Collection Time    07/25/14  6:09 AM      Result Value Ref Range   Prothrombin Time 23.6 (*) 11.6 - 15.2 seconds   INR 2.10 (*) 0.00 - 1.49   Comment: Performed at [facility]     Status: Abnormal   Collection Time    07/26/14  6:23 AM      Result Value Ref Range   Prothrombin Time 26.0 (*) 11.6 - 15.2 seconds   INR 2.38 (*) 0.00 - 1.49   Comment: Performed at  [facility]    Physical Findings: AIMS: Facial and Oral Movements Muscles of Facial Expression: None, normal Lips and Perioral Area: None, normal Jaw: None, normal Tongue: None, normal,Extremity Movements Upper (arms, wrists, hands, fingers): None, normal Lower (legs, knees, ankles, toes): None, normal, Trunk Movements Neck, shoulders, hips: None, normal, Overall Severity Severity of abnormal movements (highest score from questions above): None, normal Incapacitation due to abnormal movements: None, normal Patient's awareness of abnormal movements (rate only patient's report): No Awareness, Dental Status Current problems with teeth and/or dentures?: No Does patient usually wear dentures?: No  CIWA:  CIWA-Ar Total: 2 COWS:  COWS Total Score: 3  Psychiatric Specialty Exam: See Psychiatric Specialty Exam and Suicide Risk Assessment completed by Attending Physician prior to discharge.  Discharge destination:  Home  Is patient on multiple antipsychotic therapies at discharge:  No   Has Patient had three or more failed trials of antipsychotic monotherapy by history:  No  Recommended Plan for Multiple Antipsychotic Therapies: NA  Discharge Instructions   Discharge instructions    Complete by:  As directed   Please see your Primary Care Provider for further management of your medical problems such as low  thyroid, elevated blood pressure, and for coumadin dosing.            Medication List       Indication   amLODipine 5 MG tablet  Commonly known as:  NORVASC  Take 1 tablet (5 mg total) by mouth daily.   Indication:  High Blood Pressure     benztropine 0.5 MG tablet  Commonly known as:  COGENTIN  Take 1 tablet (0.5 mg total) by mouth 2 (two) times daily.   Indication:  Extrapyramidal Reaction caused by Medications     calcium carbonate 500 MG chewable tablet  Commonly known as:  TUMS - dosed in mg elemental calcium  Chew 1 tablet (200 mg of elemental calcium total) by mouth 2 (two) times daily.   Indication:  Acid reflux     ferrous sulfate 325 (65 FE) MG tablet  Take 1 tablet (325 mg total) by mouth daily.   Indication:  Iron Deficiency     labetalol 200 MG tablet  Commonly known as:  NORMODYNE  Take 1 tablet (200 mg total) by mouth 2 (two) times daily.   Indication:  High Blood Pressure     levothyroxine 125 MCG tablet  Commonly known as:  SYNTHROID  Take 1 tablet (125 mcg total) by mouth daily.   Indication:  Underactive Thyroid     polyvinyl alcohol 1.4 % ophthalmic solution  Commonly known as:  LUBRICANT DROPS  Place 1 drop into both eyes as needed.   Indication:  For eye irritation     ranitidine 150 MG tablet  Commonly known as:  ZANTAC  Take 1 tablet (150 mg total) by mouth at bedtime.   Indication:  Gastroesophageal Reflux Disease     risperiDONE 2 MG tablet  Commonly known as:  RISPERDAL  Take 1 tablet (2 mg total) by mouth 2 (two) times daily.   Indication:  Schizophrenia     traZODone 100 MG tablet  Commonly known as:  DESYREL  Take 1 tablet (100 mg total) by mouth at bedtime as needed for sleep.   Indication:  Trouble Sleeping     warfarin 5 MG tablet  Commonly known as:  COUMADIN  Take 1-1.5 tablets (5-7.5 mg total) by mouth daily. Take 5mg  on Tuesday, Wednesday, Friday, Saturday, and Sunday. Take  7.5 mg on Monday and Thursday.   Indication:   Blood Clot in a Deep Vein           Follow-up Information   Follow up with [facility]. (Go to the walk in clinic M-F between 8 and 10 AM for your hospital follow up appointment)    Contact information:   [place] [id number]      Follow-up recommendations:   Activity: as tolerated  Diet: healthy  Tests: routine  Other: patient to keep her after care appointment   Comments:   Take all your medications as prescribed by your mental healthcare provider.  Report any adverse effects and or reactions from your medicines to your outpatient provider promptly.  Patient is instructed and cautioned to not engage in alcohol and or illegal drug use while on prescription medicines.  In the event of worsening symptoms, patient is instructed to call the crisis hotline, 911 and or go to the nearest ED for appropriate evaluation and treatment of symptoms.  Follow-up with your primary care provider for your other medical issues, concerns and or health care needs.   Total Discharge Time:  Greater than 30 minutes.  SignedElmarie Shiley NP-C 07/26/2014, 11:29 AM  Patient seen, evaluated and I agree with notes by Nurse Practitioner. Corena Pilgrim, MD

## 2014-07-26 NOTE — Progress Notes (Signed)
Patient ID: [name], female   DOB: 31-Dec-1970, 43 y.o.   MRN: [id number]   Pt. Denies SI/HI and A/V hallucinations. Patient rates her symptoms at 0/10 for the day. Belongings at bedside returned to patient at time of discharge. No items in locker. Patient denies any pain or discomfort. Discharge instructions and medications were reviewed with patient. Patient verbalized understanding of both medications and discharge instructions. Q15 minute safety checks maintained until discharged. No distress upon discharge.

## 2014-07-26 NOTE — BHH Suicide Risk Assessment (Signed)
[place] INPATIENT:  Family/Significant Other Suicide Prevention Education  Suicide Prevention Education:  Education Completed; No one has been identified by the patient as the family member/significant other with whom the patient will be residing, and identified as the person(s) who will aid the patient in the event of a mental health crisis (suicidal ideations/suicide attempt).  With written consent from the patient, the family member/significant other has been provided the following suicide prevention education, prior to the and/or following the discharge of the patient.  The suicide prevention education provided includes the following:  Suicide risk factors  Suicide prevention and interventions  National Suicide Hotline telephone number  [facility] assessment telephone number  [facility] Emergency Assistance [place] and/or Residential Mobile Crisis Unit telephone number  Request made of family/significant other to:  Remove weapons (e.g., guns, rifles, knives), all items previously/currently identified as safety concern.    Remove drugs/medications (over-the-counter, prescriptions, illicit drugs), all items previously/currently identified as a safety concern.  The family member/significant other verbalizes understanding of the suicide prevention education information provided.  The family member/significant other agrees to remove the items of safety concern listed above. The patient did not endorse SI at the time of admission, nor did the patient c/o SI during the stay here.  SPE not required.   Roque Lias B 07/26/2014, 10:19 AM

## 2014-07-26 NOTE — Progress Notes (Signed)
  D: Pt observed sleeping in bed with eyes closed. RR even and unlabored. No distress noted  .  A: Q 15 minute checks were done for safety.  R: safety maintained on unit.  

## 2014-07-26 NOTE — Progress Notes (Signed)
[facility] Adult Case Management Discharge Plan :  Will you be returning to the same living situation after discharge: No.  No openings at [facility] nor at [facility].  Pt declined offer to get her to shelter in [place].  "I'm a survivor.  I want to stay in [place].  I can do this myself." At discharge, do you have transportation home?:Yes,  bus pass Do you have the ability to pay for your medications:Yes,  mental health  Release of information consent forms completed and in the chart;  Patient's signature needed at discharge.  Patient to Follow up at: Follow-up Information   Follow up with [facility]. (Go to the walk in clinic M-F between 8 and 10 AM for your hospital follow up appointment)    Contact information:   [place] [phone]      Patient denies SI/HI:   Yes,  yes    Safety Planning and Suicide Prevention discussed:  Yes,  yes  Roque Lias B 07/26/2014, 11:26 AM

## 2014-07-26 NOTE — BHH Suicide Risk Assessment (Signed)
Demographic Factors:  Low socioeconomic status, Unemployed and Homeless female  Total Time spent with patient: 20 minutes  Psychiatric Specialty Exam: Physical Exam  Psychiatric: She has a normal mood and affect. Her speech is normal and behavior is normal. Judgment and thought content normal. Cognition and memory are normal.    Review of Systems  Constitutional: Negative.   HENT: Negative.   Eyes: Negative.   Respiratory: Negative.   Cardiovascular: Negative.   Gastrointestinal: Negative.   Genitourinary: Negative.   Musculoskeletal: Negative.   Skin: Negative.   Neurological: Negative.   Endo/Heme/Allergies: Negative.   Psychiatric/Behavioral: Negative.     Blood pressure 111/79, pulse 72, temperature 97.9 F (36.6 C), temperature source Oral, resp. rate 18, height 5' 7.75" (1.721 m), weight 131.543 kg (290 lb), last menstrual period 09/18/2012.Body mass index is 44.41 kg/(m^2).  General Appearance: Fairly Groomed  Engineer, water::  Good  Speech:  Clear and Coherent  Volume:  Normal  Mood:  Euthymic  Affect:  Appropriate  Thought Process:  Goal Directed  Orientation:  Full (Time, Place, and Person)  Thought Content:  Negative  Suicidal Thoughts:  No  Homicidal Thoughts:  No  Memory:  Immediate;   Fair Recent;   Fair Remote;   Fair  Judgement:  Fair  Insight:  Fair  Psychomotor Activity:  Normal  Concentration:  Fair  Recall:  [employer] of Knowledge:Fair  Language: Negative  Akathisia:  No  Handed:  Right  AIMS (if indicated):     Assets:  Communication Skills Physical Health  Sleep:  Number of Hours: 6.25    Musculoskeletal: Strength & Muscle Tone: within normal limits Gait & Station: normal Patient leans: N/A   Mental Status Per Nursing Assessment::   On Admission:  NA  Current Mental Status by Physician: patient denies suicidal ideation, intent or plan  Loss Factors: Financial problems/change in socioeconomic status  Historical  Factors: NA  Risk Reduction Factors:   Positive social support  Continued Clinical Symptoms:  Resolving delusion and psychosis  Cognitive Features That Contribute To Risk:  Closed-mindedness Polarized thinking    Suicide Risk:  Minimal: No identifiable suicidal ideation.  Patients presenting with no risk factors but with morbid ruminations; may be classified as minimal risk based on the severity of the depressive symptoms  Discharge Diagnoses:   AXIS I:  Paranoid schizophrenia  AXIS II:  Cluster B Traits AXIS III:   Past Medical History  Diagnosis Date  . Thyroid disease   . Fibroid tumor   . DVT (deep venous thrombosis) 09/30/12    in upper extremities b/l and LLE  . Anemia   . Thyroid nodule     s/p biopsy  . Graves disease   . Multinodular goiter 10/01/2012  . Lupus anticoagulant positive 10/02/2012  . Hypertension   . 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 Arnoldo Morale on 12/13/2012.  Marland Kitchen Anticardiolipin antibody positive 04/26/2013    IgG   AXIS IV:  economic problems, housing problems, other psychosocial or environmental problems and problems related to social environment AXIS V:  61-70 mild symptoms  Plan Of Care/Follow-up recommendations:  Activity:  as tolerated Diet:  healthy Tests:  routine Other:  patient to keep her after care appointment  Is patient on multiple antipsychotic therapies at discharge:  No   Has Patient had three or more failed trials of antipsychotic monotherapy by history:  No  Recommended Plan for Multiple Antipsychotic Therapies: NA  Corena Pilgrim, MD 07/26/2014, 9:49 AM

## 2014-07-31 NOTE — Progress Notes (Signed)
Patient Discharge Instructions:  After Visit Summary (AVS):   Faxed to:  07/31/14 Discharge Summary Note:   Faxed to:  07/31/14 Psychiatric Admission Assessment Note:   Faxed to:  07/31/14 Suicide Risk Assessment - Discharge Assessment:   Faxed to:  07/31/14 Faxed/Sent to the Next Level Care provider:  07/31/14 Faxed to [facility] [place], 07/31/2014, 3:19 PM

## 2014-08-11 ENCOUNTER — Emergency Department (HOSPITAL_COMMUNITY)
Admission: EM | Admit: 2014-08-11 | Discharge: 2014-08-13 | Disposition: A | Discharge status: Home / Self Care | Payer: BC Managed Care – PPO | Attending: Emergency Medicine | Admitting: Emergency Medicine

## 2014-08-11 ENCOUNTER — Encounter (HOSPITAL_COMMUNITY): Payer: Self-pay | Admitting: Emergency Medicine

## 2014-08-11 DIAGNOSIS — E039 Hypothyroidism, unspecified: Secondary | ICD-10-CM | POA: Diagnosis not present

## 2014-08-11 DIAGNOSIS — F200 Paranoid schizophrenia: Secondary | ICD-10-CM

## 2014-08-11 DIAGNOSIS — F209 Schizophrenia, unspecified: Secondary | ICD-10-CM

## 2014-08-11 DIAGNOSIS — I10 Essential (primary) hypertension: Secondary | ICD-10-CM | POA: Diagnosis not present

## 2014-08-11 DIAGNOSIS — R451 Restlessness and agitation: Secondary | ICD-10-CM

## 2014-08-11 DIAGNOSIS — IMO0002 Reserved for concepts with insufficient information to code with codable children: Secondary | ICD-10-CM | POA: Insufficient documentation

## 2014-08-11 HISTORY — DX: Paranoid schizophrenia: F20.0

## 2014-08-11 HISTORY — DX: Delusional disorders: F22

## 2014-08-11 LAB — BASIC METABOLIC PANEL
Anion gap: 12 (ref 5–15)
BUN: 11 mg/dL (ref 6–23)
CALCIUM: 8.9 mg/dL (ref 8.4–10.5)
CO2: 25 mEq/L (ref 19–32)
CREATININE: 1.11 mg/dL — AB (ref 0.50–1.10)
Chloride: 103 mEq/L (ref 96–112)
GFR calc Af Amer: 68 mL/min — ABNORMAL LOW (ref >90)
GFR, EST NON AFRICAN AMERICAN: 59 mL/min — AB (ref >90)
GLUCOSE: 124 mg/dL — AB (ref 70–99)
Potassium: 3.5 mEq/L — ABNORMAL LOW (ref 3.7–5.3)
SODIUM: 140 meq/L (ref 137–147)

## 2014-08-11 LAB — CBC WITH DIFFERENTIAL/PLATELET
Basophils Absolute: 0 10*3/uL (ref 0.0–0.1)
Basophils Relative: 0 % (ref 0–1)
EOS ABS: 0.2 10*3/uL (ref 0.0–0.7)
Eosinophils Relative: 3 % (ref 0–5)
HEMATOCRIT: 41 % (ref 36.0–46.0)
Hemoglobin: 13.7 g/dL (ref 12.0–15.0)
Lymphocytes Relative: 25 % (ref 12–46)
Lymphs Abs: 1.3 10*3/uL (ref 0.7–4.0)
MCH: 28.4 pg (ref 26.0–34.0)
MCHC: 33.4 g/dL (ref 30.0–36.0)
MCV: 85.1 fL (ref 78.0–100.0)
MONO ABS: 0.4 10*3/uL (ref 0.1–1.0)
Monocytes Relative: 8 % (ref 3–12)
Neutro Abs: 3.5 10*3/uL (ref 1.7–7.7)
Neutrophils Relative %: 64 % (ref 43–77)
PLATELETS: 225 10*3/uL (ref 150–400)
RBC: 4.82 MIL/uL (ref 3.87–5.11)
RDW: 14.8 % (ref 11.5–15.5)
WBC: 5.4 10*3/uL (ref 4.0–10.5)

## 2014-08-11 LAB — PROTIME-INR
INR: 0.93 (ref 0.00–1.49)
PROTHROMBIN TIME: 12.5 s (ref 11.6–15.2)

## 2014-08-11 LAB — RAPID URINE DRUG SCREEN, HOSP PERFORMED
AMPHETAMINES: NOT DETECTED
BENZODIAZEPINES: NOT DETECTED
Barbiturates: NOT DETECTED
Cocaine: NOT DETECTED
Opiates: NOT DETECTED
Tetrahydrocannabinol: NOT DETECTED

## 2014-08-11 LAB — ETHANOL

## 2014-08-11 MED ORDER — RISPERIDONE 1 MG PO TABS
2.0000 mg | ORAL_TABLET | Freq: Two times a day (BID) | ORAL | Status: DC
  Administered 2014-08-11 – 2014-08-13 (×4): 2 mg via ORAL
  Filled 2014-08-11 (×11): qty 2

## 2014-08-11 MED ORDER — FAMOTIDINE 20 MG PO TABS
20.0000 mg | ORAL_TABLET | Freq: Every day | ORAL | Status: DC
  Administered 2014-08-11 – 2014-08-12 (×2): 20 mg via ORAL
  Filled 2014-08-11 (×2): qty 1

## 2014-08-11 MED ORDER — RISPERIDONE 1 MG PO TABS
ORAL_TABLET | ORAL | Status: AC
  Filled 2014-08-11: qty 2

## 2014-08-11 MED ORDER — TRAZODONE HCL 100 MG PO TABS
100.0000 mg | ORAL_TABLET | Freq: Every evening | ORAL | Status: DC | PRN
  Administered 2014-08-11: 100 mg via ORAL
  Filled 2014-08-11: qty 2

## 2014-08-11 MED ORDER — AMLODIPINE BESYLATE 5 MG PO TABS
5.0000 mg | ORAL_TABLET | Freq: Every day | ORAL | Status: DC
  Administered 2014-08-11 – 2014-08-13 (×3): 5 mg via ORAL
  Filled 2014-08-11 (×3): qty 1

## 2014-08-11 MED ORDER — LABETALOL HCL 200 MG PO TABS
200.0000 mg | ORAL_TABLET | Freq: Two times a day (BID) | ORAL | Status: DC
  Administered 2014-08-11 – 2014-08-13 (×4): 200 mg via ORAL
  Filled 2014-08-11 (×11): qty 1

## 2014-08-11 MED ORDER — FERROUS SULFATE 325 (65 FE) MG PO TABS
325.0000 mg | ORAL_TABLET | Freq: Every day | ORAL | Status: DC
  Administered 2014-08-11 – 2014-08-13 (×3): 325 mg via ORAL
  Filled 2014-08-11 (×9): qty 1

## 2014-08-11 MED ORDER — WARFARIN SODIUM 5 MG PO TABS: 7.5000 mg | ORAL_TABLET | ORAL | Status: DC

## 2014-08-11 MED ORDER — BENZTROPINE MESYLATE 1 MG PO TABS
0.5000 mg | ORAL_TABLET | Freq: Two times a day (BID) | ORAL | Status: DC
  Administered 2014-08-11 – 2014-08-13 (×4): 0.5 mg via ORAL
  Filled 2014-08-11 (×4): qty 1

## 2014-08-11 MED ORDER — WARFARIN SODIUM 5 MG PO TABS
5.0000 mg | ORAL_TABLET | ORAL | Status: DC
  Administered 2014-08-11 – 2014-08-12 (×2): 5 mg via ORAL
  Filled 2014-08-11 (×3): qty 1

## 2014-08-11 MED ORDER — WARFARIN - PHYSICIAN DOSING INPATIENT: Freq: Every day | Status: DC

## 2014-08-11 MED ORDER — CALCIUM CARBONATE ANTACID 500 MG PO CHEW
1.0000 | CHEWABLE_TABLET | Freq: Two times a day (BID) | ORAL | Status: DC
  Administered 2014-08-12 – 2014-08-13 (×2): 200 mg via ORAL
  Filled 2014-08-11 (×8): qty 1

## 2014-08-11 MED ORDER — WARFARIN SODIUM 5 MG PO TABS
ORAL_TABLET | ORAL | Status: AC
  Filled 2014-08-11: qty 1

## 2014-08-11 MED ORDER — LEVOTHYROXINE SODIUM 125 MCG PO TABS
125.0000 ug | ORAL_TABLET | Freq: Every day | ORAL | Status: DC
  Administered 2014-08-13: 125 ug via ORAL
  Filled 2014-08-11 (×4): qty 1

## 2014-08-11 MED ORDER — LABETALOL HCL 200 MG PO TABS
ORAL_TABLET | ORAL | Status: AC
  Filled 2014-08-11: qty 1

## 2014-08-11 NOTE — ED Notes (Signed)
PT brough it by [employer] police officers with IVC paperwork. PT denies any SI/HI. IVC paperwork states pt was tearing up the apartment and throwing things around and she is depressed and talking to herself per IVC paperwork.
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
## 2014-08-12 DIAGNOSIS — F29 Unspecified psychosis not due to a substance or known physiological condition: Secondary | ICD-10-CM

## 2014-08-12 DIAGNOSIS — F39 Unspecified mood [affective] disorder: Secondary | ICD-10-CM

## 2014-08-12 LAB — PROTIME-INR
INR: 1 (ref 0.00–1.49)
Prothrombin Time: 13.2 seconds (ref 11.6–15.2)

## 2014-08-12 MED ORDER — WARFARIN SODIUM 5 MG PO TABS
ORAL_TABLET | ORAL | Status: AC
  Filled 2014-08-12: qty 1

## 2014-08-12 NOTE — ED Notes (Signed)
Pt given lunch tray, sitter remains at bedside,

## 2014-08-12 NOTE — ED Notes (Signed)
Synthroid not available

## 2014-08-12 NOTE — ED Notes (Signed)
[facility] here to transport pt, pt aware of transfer and calm,cooperative at this time

## 2014-08-12 NOTE — ED Notes (Signed)
Pt states she does not need a pill and be sent somewhere. States she is having martial issues. All she needs is to be able to stand on her own two feet.

## 2014-08-12 NOTE — ED Notes (Signed)
Report given to Narda Rutherford RN at [facility] ed psych unit,

## 2014-08-12 NOTE — ED Notes (Signed)
Pt. To [facility] from [facility] ambulatory without difficulty. Report from Gerlene Burdock RN. Pt. Is alert and oriented, warm and dry in no distress. Pt. Denies SI, HI, and AVH. Pt. Calm and cooperative. Pt. Encouraged to let Nursing staff know if he has concerns or needs.

## 2014-08-12 NOTE — ED Notes (Signed)
Pt given dinner tray, sitter remains at bedside,

## 2014-08-12 NOTE — ED Notes (Signed)
Patient sleeping at this time.
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
## 2014-08-12 NOTE — ED Notes (Signed)
Pharmacy called for pt's Risperdal medication,

## 2014-08-12 NOTE — ED Provider Notes (Signed)
Filed Vitals:   08/12/14 1121  BP: 140/92  Pulse: 93  Temp:   Resp:    TTS requests that pt get transferred to [facility] for further psychiatric treatment.  Psychiatry team will be able to assess her there.  Pt is stable for transfer  Dorie Rank, MD 08/12/14 1525

## 2014-08-12 NOTE — ED Notes (Signed)
Pt is not happy that she is to be admitted. States she is not going to take any of their pills and she will just stay in her room. States she will play their game if that is what they want.

## 2014-08-12 NOTE — ED Notes (Signed)
Pt watching tv, sitter remains at bedside,

## 2014-08-12 NOTE — ED Notes (Signed)
Pt waiting for Dr knapp to finish ivc paperwork prior to being transferred,

## 2014-08-12 NOTE — ED Notes (Addendum)
Pt upset about being sent to [place] ed, states "i just need to get back on my feet, yall are taking my husband's word over mine" comfort measures provided to pt, sitter remains at bedside,

## 2014-08-12 NOTE — Progress Notes (Signed)
The following facilities have been contacted regarding bed availability for inptx on pt's behalf:  REFERRAL FAXED [facility]- per [facility] 4 female beds available [facility]- per Arbie Cookey 2 beds available [facility]- per [place] beds available [facility]- per Jeannetta Nap can fax for review [facility]- per Roderic Palau can fax [facility]- per Seth Bake have 4 female adult beds available [place]- per [place] having d/c's today and could fax for review [facility]  [facility]- per [place], "looking at their ED right now" [place] per [facility]- per [place] c/a beds only [place]- per Amy low acuity only [facility]- per [place] [facility] Disposition [facility]

## 2014-08-12 NOTE — ED Notes (Addendum)
Per Sammuel Cooper at behavioral health pt can be moved to [place] ed, Dr Lenna Sciara knapp notified,

## 2014-08-12 NOTE — ED Notes (Signed)
Per Angelica Chessman at [facility] they may be able to accept pt tomorrow.

## 2014-08-13 DIAGNOSIS — F200 Paranoid schizophrenia: Secondary | ICD-10-CM

## 2014-08-13 MED ORDER — TRAZODONE HCL 100 MG PO TABS: 100.0000 mg | ORAL_TABLET | Freq: Every evening | ORAL | Status: DC | PRN

## 2014-08-13 MED ORDER — BENZTROPINE MESYLATE 0.5 MG PO TABS: 0.5000 mg | ORAL_TABLET | Freq: Two times a day (BID) | ORAL | Status: DC

## 2014-08-13 MED ORDER — RISPERIDONE 2 MG PO TABS: 2.0000 mg | ORAL_TABLET | Freq: Two times a day (BID) | ORAL | Status: DC

## 2014-08-13 NOTE — [facility] Suicide Risk Assessment (Cosign Needed)
Suicide Risk Assessment  Discharge Assessment     Demographic Factors:  Low socioeconomic status and Unemployed  Total Time spent with patient: 30 minutes  Psychiatric Specialty Exam:     Blood pressure 127/85, pulse 82, temperature 98.1 F (36.7 C), temperature source Oral, resp. rate 18, height 5\' 9"  (1.753 m), weight 105.235 kg (232 lb), last menstrual period 09/18/2012, SpO2 99.00%.Body mass index is 34.24 kg/(m^2).  General Appearance: Casual and Disheveled  Eye Contact::  Good  Speech:  Clear and Coherent and Normal Rate  Volume:  Normal  Mood:  Angry and Depressed  Affect:  Congruent, Depressed and Flat  Thought Process:  Coherent, Goal Directed and Intact  Orientation:  Full (Time, Place, and Person)  Thought Content:  WDL  Suicidal Thoughts:  No  Homicidal Thoughts:  No  Memory:  Immediate;   Good Recent;   Good Remote;   Good  Judgement:  Fair  Insight:  Good and Fair  Psychomotor Activity:  Normal  Concentration:  Good  Recall:  NA  Fund of Knowledge:Good  Language: Good  Akathisia:  NA  Handed:  Right  AIMS (if indicated):     Assets:  Desire for Improvement Financial Resources/Insurance  Sleep:       Musculoskeletal: Strength & Muscle Tone: within normal limits Gait & Station: normal Patient leans: N/A   Mental Status Per Nursing Assessment::   On Admission:     Current Mental Status by Physician: NA  Loss Factors: NA  Historical Factors: Impulsivity and NA  Risk Reduction Factors:   Living with another person, especially a relative  Continued Clinical Symptoms:  Schizophrenia:   Paranoid or undifferentiated type  Cognitive Features That Contribute To Risk:  Polarized thinking    Suicide Risk:  Minimal: No identifiable suicidal ideation.  Patients presenting with no risk factors but with morbid ruminations; may be classified as minimal risk based on the severity of the depressive symptoms  Discharge Diagnoses:   AXIS I:  Mood  Disorder NOS and Paranoid Schizophrenia AXIS II:  Deferred AXIS III:   Past Medical History  Diagnosis Date  . Thyroid disease   . Fibroid tumor   . DVT (deep venous thrombosis) 09/30/12    in upper extremities b/l and LLE  . Anemia   . Thyroid nodule     s/p biopsy  . Graves disease   . Multinodular goiter 10/01/2012  . Lupus anticoagulant positive 10/02/2012  . Hypertension   . 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 Arnoldo Morale on 12/13/2012.  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 Paranoid schizophrenia   . Delusional disorder    AXIS IV:  housing problems, occupational problems, other psychosocial or environmental problems, problems related to social environment and problems with primary support group AXIS V:  51-60 moderate symptoms  Plan Of Care/Follow-up recommendations:  Activity:  As tolerated Diet:  Regular  Is patient on multiple antipsychotic therapies at discharge:  No   Has Patient had three or more failed trials of antipsychotic monotherapy by history:  No  Recommended Plan for Multiple Antipsychotic Therapies: NA    Charmaine Downs, C   PMHNP-BC 08/13/2014, 3:35 PM

## 2014-08-13 NOTE — ED Notes (Signed)
CSW into see 

## 2014-08-13 NOTE — Progress Notes (Signed)
[facility] received follow up calls from the following facilities:  1)[facility]-under review 2)[facility]-on waitlist  Wyvonnia Dusky, [facility]/NS

## 2014-08-13 NOTE — ED Notes (Signed)
Dr Lenna Sciara and [name] into see

## 2014-08-13 NOTE — ED Notes (Signed)
Up tot he bathroom to shower and change scrubs 

## 2014-08-13 NOTE — ED Notes (Signed)
Pt sent home in taxi cab with voucher provided by Education officer, museum. Prescriptions given to patient. Belongings returned to patient upon leaving the unit.

## 2014-08-13 NOTE — BH Assessment (Signed)
[facility] Assessment Progress Note   The following facilities were contacted and pt referral faxed, as possible beds available:  [facility] - no answer, but referral faxed [facility] - left message and referral faxed  No beds at the following facilities:  [facility] (only takes voluntary patients)  TTS will continue to seek placement for the pt.  Shaune Pascal, MS, [facility] Licensed Professional Counselor Triage Specialist

## 2014-08-13 NOTE — ED Notes (Signed)
Pt discharged per MD order. Reviewed discharge instructions. All belongings returned. Denies pain, SI, HI, AVH. No acute distress noted.
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
## 2014-08-14 ENCOUNTER — Telehealth: Payer: Self-pay | Admitting: Family Medicine

## 2014-08-14 NOTE — Telephone Encounter (Signed)
She needs to call her Medicare, I dont know who is accepting pt, try Delphina Cahill

## 2014-08-14 NOTE — Telephone Encounter (Signed)
Patient is calling because she received a letter in the mail about making an appointment before she gets medication, she wants dr Buelah Manis to know that she does not have the transportation to get here would like a call back from dr Buelah Manis if possible  [phone]

## 2014-08-14 NOTE — Telephone Encounter (Signed)
Call placed to patient and patient made aware.  

## 2014-08-14 NOTE — Telephone Encounter (Signed)
Call returned to patient.   Patient has not been seen at [facility]. Last office visit was in [place] in 12/2012.. States that she does not have transportation to get to [facility], but if MD could recommend a new PCP in [place], she would be able to get there.   MD to be made aware.

## 2014-08-22 ENCOUNTER — Telehealth: Payer: Self-pay | Admitting: Family Medicine

## 2014-08-22 NOTE — Telephone Encounter (Signed)
Patient comes here to see dr Buelah Manis, but cannot get here because of transportation issues would like to know if we could give her any recommendations on who she could go to in [place]   [phone]

## 2014-08-22 NOTE — Telephone Encounter (Signed)
Per prior noted, MD is not sure who is accepting new patients in [place], but her recommendation is Dr. Delphina Cahill.

## 2015-01-12 ENCOUNTER — Encounter (HOSPITAL_COMMUNITY): Payer: Self-pay | Admitting: *Deleted

## 2015-01-12 ENCOUNTER — Emergency Department (HOSPITAL_COMMUNITY): Payer: BLUE CROSS/BLUE SHIELD

## 2015-01-12 ENCOUNTER — Emergency Department (HOSPITAL_COMMUNITY)
Admission: EM | Admit: 2015-01-12 | Discharge: 2015-01-13 | Disposition: A | Discharge status: Other Acute Inpatient Hospital | Payer: BLUE CROSS/BLUE SHIELD | Attending: Emergency Medicine | Admitting: Emergency Medicine

## 2015-01-12 DIAGNOSIS — F200 Paranoid schizophrenia: Secondary | ICD-10-CM | POA: Insufficient documentation

## 2015-01-12 DIAGNOSIS — R1011 Right upper quadrant pain: Secondary | ICD-10-CM | POA: Diagnosis not present

## 2015-01-12 DIAGNOSIS — Z862 Personal history of diseases of the blood and blood-forming organs and certain disorders involving the immune mechanism: Secondary | ICD-10-CM | POA: Diagnosis not present

## 2015-01-12 DIAGNOSIS — R1033 Periumbilical pain: Secondary | ICD-10-CM | POA: Insufficient documentation

## 2015-01-12 DIAGNOSIS — I10 Essential (primary) hypertension: Secondary | ICD-10-CM | POA: Diagnosis not present

## 2015-01-12 DIAGNOSIS — Z79899 Other long term (current) drug therapy: Secondary | ICD-10-CM | POA: Diagnosis not present

## 2015-01-12 DIAGNOSIS — E079 Disorder of thyroid, unspecified: Secondary | ICD-10-CM | POA: Diagnosis not present

## 2015-01-12 DIAGNOSIS — Z046 Encounter for general psychiatric examination, requested by authority: Secondary | ICD-10-CM | POA: Diagnosis present

## 2015-01-12 DIAGNOSIS — Z86718 Personal history of other venous thrombosis and embolism: Secondary | ICD-10-CM | POA: Insufficient documentation

## 2015-01-12 LAB — URINALYSIS, ROUTINE W REFLEX MICROSCOPIC
Glucose, UA: NEGATIVE mg/dL
Ketones, ur: 15 mg/dL — AB
LEUKOCYTES UA: NEGATIVE
NITRITE: NEGATIVE
Protein, ur: 30 mg/dL — AB
SPECIFIC GRAVITY, URINE: 1.025 (ref 1.005–1.030)
Urobilinogen, UA: 1 mg/dL (ref 0.0–1.0)
pH: 6.5 (ref 5.0–8.0)

## 2015-01-12 LAB — COMPREHENSIVE METABOLIC PANEL
ALT: 48 U/L — AB (ref 0–35)
ANION GAP: 11 (ref 5–15)
AST: 45 U/L — AB (ref 0–37)
Albumin: 4.2 g/dL (ref 3.5–5.2)
Alkaline Phosphatase: 92 U/L (ref 39–117)
BILIRUBIN TOTAL: 1 mg/dL (ref 0.3–1.2)
BUN: 26 mg/dL — AB (ref 6–23)
CO2: 22 mmol/L (ref 19–32)
CREATININE: 1.68 mg/dL — AB (ref 0.50–1.10)
Calcium: 9 mg/dL (ref 8.4–10.5)
Chloride: 102 mEq/L (ref 96–112)
GFR calc Af Amer: 41 mL/min — ABNORMAL LOW (ref >90)
GFR, EST NON AFRICAN AMERICAN: 36 mL/min — AB (ref >90)
GLUCOSE: 104 mg/dL — AB (ref 70–99)
POTASSIUM: 3.5 mmol/L (ref 3.5–5.1)
Sodium: 135 mmol/L (ref 135–145)
Total Protein: 7.9 g/dL (ref 6.0–8.3)

## 2015-01-12 LAB — SALICYLATE LEVEL: Salicylate Lvl: <4 mg/dL (ref 2.8–20.0)

## 2015-01-12 LAB — CBC WITH DIFFERENTIAL/PLATELET
BASOS ABS: 0 10*3/uL (ref 0.0–0.1)
BASOS PCT: 0 % (ref 0–1)
EOS PCT: 1 % (ref 0–5)
Eosinophils Absolute: 0.1 10*3/uL (ref 0.0–0.7)
HCT: 42.6 % (ref 36.0–46.0)
Hemoglobin: 13.7 g/dL (ref 12.0–15.0)
LYMPHS ABS: 1.2 10*3/uL (ref 0.7–4.0)
Lymphocytes Relative: 19 % (ref 12–46)
MCH: 27.2 pg (ref 26.0–34.0)
MCHC: 32.2 g/dL (ref 30.0–36.0)
MCV: 84.5 fL (ref 78.0–100.0)
MONO ABS: 0.4 10*3/uL (ref 0.1–1.0)
Monocytes Relative: 6 % (ref 3–12)
NEUTROS ABS: 4.7 10*3/uL (ref 1.7–7.7)
Neutrophils Relative %: 74 % (ref 43–77)
Platelets: 234 10*3/uL (ref 150–400)
RBC: 5.04 MIL/uL (ref 3.87–5.11)
RDW: 15.1 % (ref 11.5–15.5)
WBC: 6.4 10*3/uL (ref 4.0–10.5)

## 2015-01-12 LAB — RAPID URINE DRUG SCREEN, HOSP PERFORMED
Amphetamines: NOT DETECTED
BENZODIAZEPINES: NOT DETECTED
Barbiturates: NOT DETECTED
COCAINE: NOT DETECTED
Opiates: NOT DETECTED
Tetrahydrocannabinol: NOT DETECTED

## 2015-01-12 LAB — URINE MICROSCOPIC-ADD ON

## 2015-01-12 LAB — ACETAMINOPHEN LEVEL

## 2015-01-12 LAB — LIPASE, BLOOD: LIPASE: 16 U/L (ref 11–59)

## 2015-01-12 LAB — ETHANOL

## 2015-01-12 MED ORDER — ONDANSETRON HCL 4 MG/2ML IJ SOLN: 4.0000 mg | Freq: Once | INTRAMUSCULAR | Status: DC

## 2015-01-12 MED ORDER — SODIUM CHLORIDE 0.9 % IV BOLUS (SEPSIS)
1000.0000 mL | Freq: Once | INTRAVENOUS | Status: AC
  Administered 2015-01-12: 1000 mL via INTRAVENOUS

## 2015-01-12 MED ORDER — STERILE WATER FOR INJECTION IJ SOLN
INTRAMUSCULAR | Status: AC
  Administered 2015-01-13: 1 mL
  Filled 2015-01-12: qty 10

## 2015-01-12 MED ORDER — SODIUM CHLORIDE 0.9 % IJ SOLN
INTRAMUSCULAR | Status: AC
  Filled 2015-01-12: qty 60

## 2015-01-12 MED ORDER — IOHEXOL 300 MG/ML  SOLN
80.0000 mL | Freq: Once | INTRAMUSCULAR | Status: AC | PRN
Start: 2015-01-12 — End: 2015-01-12
  Administered 2015-01-12: 80 mL via INTRAVENOUS

## 2015-01-12 MED ORDER — IOHEXOL 300 MG/ML  SOLN
50.0000 mL | Freq: Once | INTRAMUSCULAR | Status: AC | PRN
  Administered 2015-01-12: 50 mL via ORAL

## 2015-01-12 MED ORDER — SODIUM CHLORIDE 0.9 % IV BOLUS (SEPSIS): 1000.0000 mL | Freq: Once | INTRAVENOUS | Status: DC

## 2015-01-12 MED ORDER — ZIPRASIDONE MESYLATE 20 MG IM SOLR
20.0000 mg | Freq: Once | INTRAMUSCULAR | Status: AC
  Administered 2015-01-13: 20 mg via INTRAMUSCULAR
  Filled 2015-01-12: qty 20

## 2015-01-12 NOTE — ED Notes (Addendum)
Pt brought in by [employer] with IVC papers stating that pt has been "talking out of her head, talks to herself, she starts throwing things, refuses to take meds".  These statements are documented on the IVC papers, pt denies same. Pt denies SI/HI at this time. [employer] officer is with the pt awaiting bed availability.

## 2015-01-12 NOTE — BH Assessment (Signed)
Received call for assessment. Spoke to Sherwood Gambler, MD who said Pt has a history of schizophrenia and appears psychotic. Pt's husband petitioned for IVC because she is off medications. Tele-assessment will be initiated.  Orpah Greek Rosana Hoes, [facility] Triage Specialist [phone]

## 2015-01-12 NOTE — BHH Counselor (Signed)
[name], [facility], confirmed adult unit is at capacity. Contacted the following facilities for placement:  BED AVAILABLE, FAXED CLINICAL INFORMATION: [facility], per [name], per [name], per [name], per [name], [name], per [name], per [name]  AT CAPACITY: [name], per [name], per [name], per [facility], per [name], per [name], per [name], per [name], per [name], per [name], per [name], per [name], per [name]  NO RESPONSE: [name] [address] [place], [facility] Triage Specialist [phone]

## 2015-01-12 NOTE — ED Provider Notes (Signed)
CSN: [id number]     Arrival date & time 01/12/15  1826 History   First MD Initiated Contact with Patient 01/12/15 2015     Chief Complaint  Patient presents with  . V70.1     (Consider location/radiation/quality/duration/timing/severity/associated sxs/prior Treatment) HPI  44 year old female brought in by police with IVC paperwork. The IVC peppers state that the patient has been talking out of her head, talking to herself, throwing things, and not taking her medicines. This was filled out by a Lura Em, who is listed as her husband. When I talked to the patient, she states she is here for back pain and abdominal pain has been going on since her hysterectomy several years ago. When asked about the police officer, the patient states that she is a friend who is taking her to [place] for vacation. Patient states her husband died yesterday and a train accident in [place].  Past Medical History  Diagnosis Date  . Thyroid disease   . Fibroid tumor   . DVT (deep venous thrombosis) 09/30/12    in upper extremities b/l and LLE  . Anemia   . Thyroid nodule     s/p biopsy  . Graves disease   . Multinodular goiter 10/01/2012  . Lupus anticoagulant positive 10/02/2012  . Hypertension   . 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 Arnoldo Morale on 12/13/2012.  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 Paranoid schizophrenia   . Delusional disorder    Past Surgical History  Procedure Laterality Date  . Supracervical abdominal hysterectomy  09/27/2012    Procedure: HYSTERECTOMY SUPRACERVICAL ABDOMINAL;  Surgeon: Jonnie Kind, MD;  Location: [facility];  Service: Gynecology;  Laterality: N/A;  . Hematoma evacuation  10/03/2012    Procedure: EVACUATION HEMATOMA;  Surgeon: Jonnie Kind, MD;  Location: [facility];  Service: Gynecology;  Laterality: N/A;  . Thyroidectomy  12/13/2012    Procedure: THYROIDECTOMY;  Surgeon: Jamesetta So, MD;  Location: [facility];   Service: General;  Laterality: Bilateral;  . Abdominal hysterectomy     Family History  Problem Relation Age of Onset  . Diabetes Mother   . Hypertension Mother   . Cancer Father     prostate   . Cancer Sister     pancreatic cancer   History  Substance Use Topics  . Smoking status: Never Smoker   . Smokeless tobacco: Never Used  . Alcohol Use: No   OB History    No data available     Review of Systems  Unable to perform ROS: Psychiatric disorder      Allergies  Review of patient's allergies indicates no known allergies.  Home Medications   Prior to Admission medications   Medication Sig Start Date End Date Taking? Authorizing Provider  amLODipine (NORVASC) 5 MG tablet Take 1 tablet (5 mg total) by mouth daily. 07/26/14   Elmarie Shiley, NP  benztropine (COGENTIN) 0.5 MG tablet Take 1 tablet (0.5 mg total) by mouth 2 (two) times daily. 08/13/14   Delfin Gant, NP  levothyroxine (SYNTHROID) 125 MCG tablet Take 1 tablet (125 mcg total) by mouth daily. 07/26/14   Elmarie Shiley, NP  risperiDONE (RISPERDAL) 2 MG tablet Take 1 tablet (2 mg total) by mouth 2 (two) times daily. 08/13/14   Delfin Gant, NP  traZODone (DESYREL) 100 MG tablet Take 1 tablet (100 mg total) by mouth at bedtime as needed for sleep. 08/13/14  Delfin Gant, NP   BP 135/92 mmHg  Pulse 111  Temp(Src) 98.3 F (36.8 C) (Oral)  Resp 20  SpO2 96%  LMP 09/18/2012 Physical Exam  Constitutional: She is oriented to person, place, and time. She appears well-developed and well-nourished. No distress.  HENT:  Head: Normocephalic and atraumatic.  Right Ear: External ear normal.  Left Ear: External ear normal.  Nose: Nose normal.  Eyes: Right eye exhibits no discharge. Left eye exhibits no discharge.  Cardiovascular: Normal rate, regular rhythm and normal heart sounds.   Pulmonary/Chest: Effort normal and breath sounds normal.  Abdominal: Soft. There is tenderness in the right upper quadrant and  periumbilical area. There is CVA tenderness (bilateral).  Neurological: She is alert and oriented to person, place, and time.  Skin: Skin is warm and dry. She is not diaphoretic.  Psychiatric: Thought content is delusional. She expresses no homicidal and no suicidal ideation.  Nursing note and vitals reviewed.   ED Course  Procedures (including critical care time) Labs Review Labs Reviewed  ACETAMINOPHEN LEVEL - Abnormal; Notable for the following:    Acetaminophen (Tylenol), Serum <10.0 (*)    All other components within normal limits  COMPREHENSIVE METABOLIC PANEL - Abnormal; Notable for the following:    Glucose, Bld 104 (*)    BUN 26 (*)    Creatinine, Ser 1.68 (*)    AST 45 (*)    ALT 48 (*)    GFR calc non Af Amer 36 (*)    GFR calc Af Amer 41 (*)    All other components within normal limits  ETHANOL  LIPASE, BLOOD  SALICYLATE LEVEL  CBC WITH DIFFERENTIAL  URINALYSIS, ROUTINE W REFLEX MICROSCOPIC  URINE RAPID DRUG SCREEN (HOSP PERFORMED)    Imaging Review No results found.   EKG Interpretation None      MDM   Final diagnoses:  Paranoid schizophrenia    Patient appears to be acutely psychotic. This patient is familiar to psychiatry, they feel she needs inpatient admission, as do I. They're currently searching for placement. She has a mild bump in her creatinine, given IV fluids but do not feel she needs inpatient admission for this. Given her abdominal and back pain a CT scan has been ordered but is not back yet. I transferred care to the oncoming ER physician who will review the CT scan before she can be admitted to psychiatry.    Ephraim Hamburger, MD 01/12/15 2150

## 2015-01-12 NOTE — BH Assessment (Addendum)
Tele Assessment Note   [name] is an 44 y.o. female, married, African-American who presents unaccompanied to [facility] ED after being petitioned for IVC by her husband, Hollyn Stucky [phone]. Pt reports she is in the ED because she is experiencing abdominal pain. She denies having any mental health symptoms. She denies symptoms of depression or anxiety. She denies suicidal ideation or history of suicide attempts. She denies homicidal ideation or history of violence. She denies auditory or visual hallucinations. She denies alcohol or substance abuse.  Pt states she is widowed and her husband died yesterday. Pt also says she has no family at all. When asked how she arrived at ED she says she was brought by law enforcement because her husband and father "think I am crazy." Pt states she became upset in [place] and was throwing things and her husband called Event organiser.   Pt has a history of schizophrenia. Pt denies she has ever received any inpatient or outpatient psychiatric treatment in the past but Pt has been hospitalized at [facility] several times in the past, the last hospitalization in July 2015. According to Pt's record she has a history of not taking her medications and becoming disorganized and delusional, thinking that she is God or that people are trying to poison her or infect her with disease.   Per Affidavit and Petition for Involuntary Commitment: "Respondent is talking out head. When she is by herself she talks to herself. She will start throwing things. She has prescribed medication but is refusing to take it. She was committed early last year for the same thing." TTS attempted to contact the petitioner without success.  Pt is dressed in hospital scrubs, alert, oriented person, place and situation but not date. Her speech is of normal rate and volume. Motor behavior is normal. Eye contact is good. Pt's mood is euythmic and affect is guarded. Thought process appears  disorganized as Pt gives completely different answers to the same question and her story is not coherent or accurate given her history. Her presentation is similar to previous episodes in ED when she is experiencing paranoid and grandiose delusions. She was calm throughout assessment.   Axis I: Unspecified schizophrenia spectrum and other psychotic disorder Axis II: Deferred Axis III:  Past Medical History  Diagnosis Date  . Thyroid disease   . Fibroid tumor   . DVT (deep venous thrombosis) 09/30/12    in upper extremities b/l and LLE  . Anemia   . Thyroid nodule     s/p biopsy  . Graves disease   . Multinodular goiter 10/01/2012  . Lupus anticoagulant positive 10/02/2012  . Hypertension   . 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 Arnoldo Morale on 12/13/2012.  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 Paranoid schizophrenia   . Delusional disorder    Axis IV: other psychosocial or environmental problems and problems with access to health care services Axis V: GAF=25  Past Medical History:  Past Medical History  Diagnosis Date  . Thyroid disease   . Fibroid tumor   . DVT (deep venous thrombosis) 09/30/12    in upper extremities b/l and LLE  . Anemia   . Thyroid nodule     s/p biopsy  . Graves disease   . Multinodular goiter 10/01/2012  . Lupus anticoagulant positive 10/02/2012  . Hypertension   . 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 Arnoldo Morale on 12/13/2012.  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 Paranoid schizophrenia   . Delusional disorder     Past Surgical History  Procedure Laterality Date  . Supracervical abdominal hysterectomy  09/27/2012    Procedure: HYSTERECTOMY SUPRACERVICAL ABDOMINAL;  Surgeon: Jonnie Kind, MD;  Location: [facility];  Service: Gynecology;  Laterality: N/A;  . Hematoma evacuation  10/03/2012    Procedure: EVACUATION HEMATOMA;  Surgeon: Jonnie Kind, MD;  Location: [facility];  Service: Gynecology;  Laterality: N/A;  . Thyroidectomy  12/13/2012    Procedure: THYROIDECTOMY;  Surgeon: Jamesetta So, MD;  Location: [facility];  Service: General;  Laterality: Bilateral;  . Abdominal hysterectomy      Family History:  Family History  Problem Relation Age of Onset  . Diabetes Mother   . Hypertension Mother   . Cancer Father     prostate   . Cancer Sister     pancreatic cancer    Social History:  reports that she has never smoked. She has never used smokeless tobacco. She reports that she does not drink alcohol or use illicit drugs.  Additional Social History:  Alcohol / Drug Use Pain Medications: Denies abuse Prescriptions: Denies abuse Over the Counter: Denies abuse History of alcohol / drug use?: No history of alcohol / drug abuse Longest period of sobriety (when/how long): NA  CIWA: CIWA-Ar BP: 135/92 mmHg Pulse Rate: 111 COWS:    PATIENT STRENGTHS: (choose at least two) Average or above average intelligence Capable of independent living Communication skills General fund of knowledge Supportive family/friends  Allergies: No Known Allergies  Home Medications:  (Not in a hospital admission)  OB/GYN Status:  Patient's last menstrual period was 09/18/2012.  General Assessment Data Location of Assessment: [facility] ED Is this a Tele or Face-to-Face Assessment?: Tele Assessment Is this an Initial Assessment or a Re-assessment for this encounter?: Initial Assessment Living Arrangements: Spouse/significant other Can pt return to current living arrangement?: Yes Admission Status: Involuntary Is patient capable of signing voluntary admission?: No Transfer from: Home Referral Source: Self/Family/Friend (IVC by family)     [place] Living Arrangements: Spouse/significant other Name of Psychiatrist: Pt denies Name of Therapist: Pt denies  Education Status Is patient currently in school?: No Current Grade:  NA Highest grade of school patient has completed: Associates degree Name of school: NA Contact person: NA  Risk to self with the past 6 months Suicidal Ideation: No Suicidal Intent: No Is patient at risk for suicide?: No Suicidal Plan?: No Access to Means: No What has been your use of drugs/alcohol within the last 12 months?: None Previous Attempts/Gestures: No How many times?: 0 Other Self Harm Risks: Denies Triggers for Past Attempts: None known Intentional Self Injurious Behavior: None Family Suicide History: No Recent stressful life event(s): Conflict (Comment) (Pt reports conflict with husband) Persecutory voices/beliefs?: No Depression: No Depression Symptoms:  (Pt denies depressive symptoms) Substance abuse history and/or treatment for substance abuse?: No Suicide prevention information given to non-admitted patients: Not applicable  Risk to Others within the past 6 months Homicidal Ideation: No Thoughts of Harm to Others: No Current Homicidal Intent: No Current Homicidal Plan: No Access to Homicidal Means: No Identified Victim: None History of harm to others?: No Assessment of Violence: None Noted Violent Behavior Description: Pt denies history of violence Does patient have access to weapons?: No Criminal Charges Pending?: No Does patient have a court date: No  Psychosis Hallucinations:  None noted Delusions: Unspecified (See assessment note)  Mental Status Report Appear/Hygiene: In scrubs Eye Contact: Good Motor Activity: Unremarkable Speech: Logical/coherent Level of Consciousness: Alert Mood: Euthymic Affect: Constricted Anxiety Level: None Thought Processes: Coherent Judgement: Impaired Orientation: Person, Place, Situation (Not oriented to date) Obsessive Compulsive Thoughts/Behaviors: None  Cognitive Functioning Concentration: Decreased Memory: Recent Intact, Remote Intact IQ: Average Insight: Poor Impulse Control: Fair Appetite:  Good Weight Loss: 0 Weight Gain: 0 Sleep: No Change Total Hours of Sleep: 6 Vegetative Symptoms: None  ADLScreening [facility] Assessment Services) Patient's cognitive ability adequate to safely complete daily activities?: Yes Patient able to express need for assistance with ADLs?: Yes Independently performs ADLs?: Yes (appropriate for developmental age)  Prior Inpatient Therapy Prior Inpatient Therapy: Yes Prior Therapy Dates: 06/2014, other admits Prior Therapy Facilty/Provider(s): [facility] Reason for Treatment: Schizophrenia  Prior Outpatient Therapy Prior Outpatient Therapy: Yes Prior Therapy Dates: 2015 Prior Therapy Facilty/Provider(s): [facility] Reason for Treatment: Schizophrenia  ADL Screening (condition at time of admission) Patient's cognitive ability adequate to safely complete daily activities?: Yes Is the patient deaf or have difficulty hearing?: No Does the patient have difficulty seeing, even when wearing glasses/contacts?: No Does the patient have difficulty concentrating, remembering, or making decisions?: No Patient able to express need for assistance with ADLs?: Yes Does the patient have difficulty dressing or bathing?: No Independently performs ADLs?: Yes (appropriate for developmental age) Does the patient have difficulty walking or climbing stairs?: No Weakness of Legs: None Weakness of Arms/Hands: None  Home Assistive Devices/Equipment Home Assistive Devices/Equipment: None    Abuse/Neglect Assessment (Assessment to be complete while patient is alone) Physical Abuse: Denies Verbal Abuse: Denies Sexual Abuse: Denies Exploitation of patient/patient's resources: Denies Self-Neglect: Denies     Regulatory affairs officer (For Healthcare) Does patient have an advance directive?: No Would patient like information on creating an advanced directive?: No - patient declined information    Additional Information 1:1 In Past 12 Months?: No CIRT Risk: No Elopement  Risk: No Does patient have medical clearance?: Yes     Disposition: Lavell Luster, AC at [facility], confirmed adult unit is at capacity. Gave clinical report to Glenda Chroman, NP who agrees Pt meets criteria for inpatient psychiatric treatment. TTS will contact other facilities for placement. Notified Dr. Sherwood Gambler and ED staff of recommendation.  Disposition Initial Assessment Completed for this Encounter: Yes Disposition of Patient: Inpatient treatment program Type of inpatient treatment program: Adult   Evelena Peat, [facility] [place], [facility] Triage Specialist [phone]   Evelena Peat 01/12/2015 9:23 PM

## 2015-01-12 NOTE — ED Notes (Signed)
During initial psych assessment pt denies any treatment for mental illness, denies visual or auditory thoughts of harming herself or others.

## 2015-01-12 NOTE — ED Provider Notes (Signed)
Patient awaiting placement by behavioral health. However CT scan abdomen pelvis had been ordered by the emergency physician that saw the patient primarily. Those results are back and they are negative there is no explanation or any acute findings on the CT scan. Patient is cleared for admission by behavioral health.  Fredia Sorrow, MD 01/12/15 2317

## 2015-01-13 NOTE — BH Assessment (Signed)
Received call from Pt's husband, Ayaana Biondo, who said Pt has been off medications for an unknown period of time. He reports Pt's behavior has become increasingly erratic and bizarre for the past week. She has been talking to herself or sitting silently and staring at nothing. He reports she has thrown things around the house and said that she thinks she is dying. He states this is similar to her last psychotic episode.  Orpah Greek Rosana Hoes, [facility] Triage Specialist [phone]

## 2015-01-13 NOTE — BHH Counselor (Signed)
Received call from [place] at [facility] stating Pt has been accepted to their [place] [facility] by Dr. Lynnell Grain. Pt can be transported after 1000. Nursing report should be called to [phone] when Pt is ready for transport. Notified Angela Burke, RN who said EDP is with a Pt and will call TTS.  Orpah Greek Rosana Hoes, [facility] Triage Specialist [phone]

## 2015-01-13 NOTE — ED Notes (Signed)
Pt had episode of vomiting, starting getting loud and verbally abusive, security and [place] [employer] officer working at ED called for assistance, charge nurse, tech, and three other nurses had to assist to give pt a shot of Geodon, pt stated if I came near hear I was going to regret it, she referred to me as "Satan, I was going to hell for using children medical cards", she also stated" the IV fluids I gave her made her sick, and I better not touch her".  Pt was placed in bed where she continues to be loud and rambling about God and Satan.

## 2015-01-13 NOTE — ED Notes (Signed)
Per ED secretary [employer] called and aware of transfer.

## 2015-01-13 NOTE — BHH Counselor (Signed)
Received call from [facility] saying Pt is declined.  Orpah Greek Rosana Hoes, [facility] Triage Specialist [phone]

## 2015-01-13 NOTE — ED Notes (Signed)
[employer] officer at bedside.

## 2015-01-13 NOTE — ED Notes (Signed)
Per Marijean Bravo at [facility] pt has a bed at [facility] and can arrive after 10am, he will put notes in Epic with information about placement.

## 2015-01-13 NOTE — ED Provider Notes (Signed)
0005:  Pt under IVC, pending psych admit. Pt now starting to escalate her behavior, throwing objects, cursing and threatening ED staff. Pt is also yelling loudly with rambling speech regarding Satan and God. ED staff attempted to deescalate and reorient pt. Pt continues to yell with rambling, tangential speech. IM geodon ordered.  0540:  Pt has been sleeping soundly after IM geodon. VS remain stable. T/C from [place]: states pt has been accepted to [facility], Dr. Lazarus Salines, after 10am today.   Francine Graven, DO 01/13/15 [phone]

## 2015-02-06 ENCOUNTER — Ambulatory Visit (HOSPITAL_COMMUNITY): Payer: Self-pay | Admitting: Psychiatry

## 2015-02-20 ENCOUNTER — Ambulatory Visit (HOSPITAL_COMMUNITY): Payer: Self-pay | Admitting: Psychiatry

## 2015-06-02 ENCOUNTER — Encounter (HOSPITAL_COMMUNITY): Payer: Self-pay | Admitting: *Deleted

## 2015-06-02 ENCOUNTER — Emergency Department (HOSPITAL_COMMUNITY)
Admission: EM | Admit: 2015-06-02 | Discharge: 2015-06-03 | Disposition: A | Discharge status: Other Acute Inpatient Hospital | Payer: BLUE CROSS/BLUE SHIELD | Attending: Emergency Medicine | Admitting: Emergency Medicine

## 2015-06-02 DIAGNOSIS — F200 Paranoid schizophrenia: Secondary | ICD-10-CM | POA: Diagnosis not present

## 2015-06-02 DIAGNOSIS — F419 Anxiety disorder, unspecified: Secondary | ICD-10-CM | POA: Insufficient documentation

## 2015-06-02 DIAGNOSIS — Z79899 Other long term (current) drug therapy: Secondary | ICD-10-CM | POA: Insufficient documentation

## 2015-06-02 DIAGNOSIS — E079 Disorder of thyroid, unspecified: Secondary | ICD-10-CM | POA: Insufficient documentation

## 2015-06-02 DIAGNOSIS — Z86018 Personal history of other benign neoplasm: Secondary | ICD-10-CM | POA: Diagnosis not present

## 2015-06-02 DIAGNOSIS — D649 Anemia, unspecified: Secondary | ICD-10-CM | POA: Insufficient documentation

## 2015-06-02 DIAGNOSIS — Z046 Encounter for general psychiatric examination, requested by authority: Secondary | ICD-10-CM | POA: Diagnosis present

## 2015-06-02 DIAGNOSIS — Z86718 Personal history of other venous thrombosis and embolism: Secondary | ICD-10-CM | POA: Insufficient documentation

## 2015-06-02 DIAGNOSIS — I10 Essential (primary) hypertension: Secondary | ICD-10-CM | POA: Diagnosis not present

## 2015-06-02 LAB — RAPID URINE DRUG SCREEN, HOSP PERFORMED
Amphetamines: NOT DETECTED
BENZODIAZEPINES: NOT DETECTED
Barbiturates: NOT DETECTED
COCAINE: NOT DETECTED
Opiates: NOT DETECTED
Tetrahydrocannabinol: NOT DETECTED

## 2015-06-02 LAB — COMPREHENSIVE METABOLIC PANEL
ALT: 49 U/L (ref 14–54)
AST: 26 U/L (ref 15–41)
Albumin: 4 g/dL (ref 3.5–5.0)
Alkaline Phosphatase: 73 U/L (ref 38–126)
Anion gap: 10 (ref 5–15)
BUN: 24 mg/dL — ABNORMAL HIGH (ref 6–20)
CO2: 29 mmol/L (ref 22–32)
Calcium: 9.3 mg/dL (ref 8.9–10.3)
Chloride: 101 mmol/L (ref 101–111)
Creatinine, Ser: 1.66 mg/dL — ABNORMAL HIGH (ref 0.44–1.00)
GFR calc Af Amer: 42 mL/min — ABNORMAL LOW (ref >60)
GFR calc non Af Amer: 36 mL/min — ABNORMAL LOW (ref >60)
Glucose, Bld: 126 mg/dL — ABNORMAL HIGH (ref 65–99)
Potassium: 3.1 mmol/L — ABNORMAL LOW (ref 3.5–5.1)
Sodium: 140 mmol/L (ref 135–145)
Total Bilirubin: 0.5 mg/dL (ref 0.3–1.2)
Total Protein: 8 g/dL (ref 6.5–8.1)

## 2015-06-02 LAB — CBC
HCT: 42.4 % (ref 36.0–46.0)
Hemoglobin: 14.2 g/dL (ref 12.0–15.0)
MCH: 28.2 pg (ref 26.0–34.0)
MCHC: 33.5 g/dL (ref 30.0–36.0)
MCV: 84.3 fL (ref 78.0–100.0)
Platelets: 213 10*3/uL (ref 150–400)
RBC: 5.03 MIL/uL (ref 3.87–5.11)
RDW: 14.9 % (ref 11.5–15.5)
WBC: 5.3 10*3/uL (ref 4.0–10.5)

## 2015-06-02 LAB — TSH: TSH: 2.234 u[IU]/mL (ref 0.350–4.500)

## 2015-06-02 LAB — SALICYLATE LEVEL: Salicylate Lvl: <4 mg/dL (ref 2.8–30.0)

## 2015-06-02 LAB — ACETAMINOPHEN LEVEL: Acetaminophen (Tylenol), Serum: <10 ug/mL — ABNORMAL LOW (ref 10–30)

## 2015-06-02 LAB — ETHANOL

## 2015-06-02 MED ORDER — TRAZODONE HCL 50 MG PO TABS
100.0000 mg | ORAL_TABLET | Freq: Every evening | ORAL | Status: DC | PRN
  Administered 2015-06-02: 100 mg via ORAL
  Filled 2015-06-02: qty 2

## 2015-06-02 MED ORDER — AMLODIPINE BESYLATE 5 MG PO TABS
5.0000 mg | ORAL_TABLET | Freq: Every day | ORAL | Status: DC
  Administered 2015-06-02: 5 mg via ORAL
  Filled 2015-06-02: qty 1

## 2015-06-02 MED ORDER — BENZTROPINE MESYLATE 1 MG PO TABS
0.5000 mg | ORAL_TABLET | Freq: Two times a day (BID) | ORAL | Status: DC
  Administered 2015-06-02: 0.5 mg via ORAL
  Filled 2015-06-02: qty 1

## 2015-06-02 MED ORDER — RISPERIDONE 1 MG PO TABS
ORAL_TABLET | ORAL | Status: AC
  Filled 2015-06-02: qty 2

## 2015-06-02 MED ORDER — CLONIDINE HCL 0.2 MG PO TABS
0.2000 mg | ORAL_TABLET | Freq: Once | ORAL | Status: AC
  Administered 2015-06-02: 0.2 mg via ORAL
  Filled 2015-06-02: qty 1

## 2015-06-02 MED ORDER — POTASSIUM CHLORIDE CRYS ER 20 MEQ PO TBCR
40.0000 meq | EXTENDED_RELEASE_TABLET | ORAL | Status: AC
  Administered 2015-06-02: 40 meq via ORAL
  Filled 2015-06-02 (×2): qty 2

## 2015-06-02 MED ORDER — RISPERIDONE 2 MG PO TABS
2.0000 mg | ORAL_TABLET | Freq: Two times a day (BID) | ORAL | Status: DC
  Administered 2015-06-02: 2 mg via ORAL
  Filled 2015-06-02 (×4): qty 1

## 2015-06-02 MED ORDER — LEVOTHYROXINE SODIUM 50 MCG PO TABS
125.0000 ug | ORAL_TABLET | Freq: Every day | ORAL | Status: DC
  Administered 2015-06-02: 125 ug via ORAL
  Filled 2015-06-02: qty 3

## 2015-06-02 NOTE — ED Notes (Signed)
TTS at this time. 
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
## 2015-06-02 NOTE — BH Assessment (Signed)
Received notification of TTS consult request. Spoke to Dr. Tanna Furry who said Pt has a history of schizophrenia and reports God is talking to her. She appears to be delusional. Tele-assessment will be initiated.  Orpah Greek Anson Fret, [place], [facility], [facility] Triage Specialist [phone]

## 2015-06-02 NOTE — ED Provider Notes (Signed)
Addendum. Patient's potassium supplemented with 40 mEq by mouth 2. Given by mouth clonidine. Recheck blood pressure improved. She is medically clear and appropriate for inpatient care  Tanna Furry, MD 06/02/15 2359

## 2015-06-03 ENCOUNTER — Inpatient Hospital Stay (HOSPITAL_COMMUNITY)
Admission: AD | Admit: 2015-06-03 | Discharge: 2015-06-11 | DRG: 885 | Disposition: A | Discharge status: Home / Self Care | Payer: BLUE CROSS/BLUE SHIELD | Source: Intra-hospital | Attending: Psychiatry | Admitting: Psychiatry

## 2015-06-03 ENCOUNTER — Encounter (HOSPITAL_COMMUNITY): Payer: Self-pay | Admitting: *Deleted

## 2015-06-03 DIAGNOSIS — N189 Chronic kidney disease, unspecified: Secondary | ICD-10-CM | POA: Diagnosis present

## 2015-06-03 DIAGNOSIS — Z79899 Other long term (current) drug therapy: Secondary | ICD-10-CM | POA: Diagnosis not present

## 2015-06-03 DIAGNOSIS — F209 Schizophrenia, unspecified: Secondary | ICD-10-CM | POA: Diagnosis present

## 2015-06-03 DIAGNOSIS — F200 Paranoid schizophrenia: Secondary | ICD-10-CM | POA: Diagnosis present

## 2015-06-03 DIAGNOSIS — E039 Hypothyroidism, unspecified: Secondary | ICD-10-CM | POA: Diagnosis present

## 2015-06-03 DIAGNOSIS — I10 Essential (primary) hypertension: Secondary | ICD-10-CM | POA: Diagnosis present

## 2015-06-03 DIAGNOSIS — E119 Type 2 diabetes mellitus without complications: Secondary | ICD-10-CM | POA: Diagnosis present

## 2015-06-03 MED ORDER — MAGNESIUM HYDROXIDE 400 MG/5ML PO SUSP: 30.0000 mL | Freq: Every day | ORAL | Status: DC | PRN

## 2015-06-03 MED ORDER — HYDROCHLOROTHIAZIDE 25 MG PO TABS: 25.0000 mg | ORAL_TABLET | Freq: Every day | ORAL | Status: DC

## 2015-06-03 MED ORDER — RISPERIDONE 1 MG PO TBDP
1.0000 mg | ORAL_TABLET | Freq: Two times a day (BID) | ORAL | Status: DC
  Administered 2015-06-03 – 2015-06-04 (×3): 1 mg via ORAL
  Filled 2015-06-03 (×8): qty 1

## 2015-06-03 MED ORDER — ALUM & MAG HYDROXIDE-SIMETH 200-200-20 MG/5ML PO SUSP: 30.0000 mL | ORAL | Status: DC | PRN

## 2015-06-03 MED ORDER — POTASSIUM CHLORIDE CRYS ER 20 MEQ PO TBCR
40.0000 meq | EXTENDED_RELEASE_TABLET | Freq: Two times a day (BID) | ORAL | Status: DC
  Administered 2015-06-03: 40 meq via ORAL

## 2015-06-03 MED ORDER — ACETAMINOPHEN 325 MG PO TABS: 650.0000 mg | ORAL_TABLET | Freq: Four times a day (QID) | ORAL | Status: DC | PRN

## 2015-06-03 MED ORDER — CLONAZEPAM 0.5 MG PO TABS
0.5000 mg | ORAL_TABLET | Freq: Two times a day (BID) | ORAL | Status: DC
  Administered 2015-06-03 – 2015-06-11 (×15): 0.5 mg via ORAL
  Filled 2015-06-03 (×16): qty 1

## 2015-06-03 MED ORDER — AMLODIPINE BESYLATE 5 MG PO TABS: 5.0000 mg | ORAL_TABLET | Freq: Once | ORAL | Status: DC
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
## 2015-06-03 NOTE — [facility] Suicide Risk Assessment (Signed)
[facility] Admission Suicide Risk Assessment   Nursing information obtained from:    Demographic factors:    Current Mental Status:    Loss Factors:    Historical Factors:    Risk Reduction Factors:    Total Time spent with patient: 1 hour Principal Problem: <principal problem not specified> Diagnosis:   Patient Active Problem List   Diagnosis Date Noted  . 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 Delusional disorder(297.1) [F22] 07/20/2014  . Paranoid schizophrenia [F20.0] 07/20/2014  . Psychosis [F29] 07/19/2014  . 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 Contact dermatitis [L25.9] 01/18/2013  . Vaginitis [N76.0] 01/18/2013  . Essential hypertension, benign [I10] 12/24/2012  . Heartburn [R12] 12/24/2012  . Hypothyroidism [E03.9] 12/24/2012  . 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 Acute DVT (deep venous thrombosis) [I82.409] 09/27/2012  . Chronic kidney disease [N18.9] 08/28/2012  . Fibroid uterus [D25.9] 06/12/2012     Continued Clinical Symptoms:  Alcohol Use Disorder Identification Test Final Score (AUDIT): 0 The "Alcohol Use Disorders Identification Test", Guidelines for Use in Primary Care, Second Edition.  World Pharmacologist [facility]). Score between 0-7:  no or low risk or alcohol related problems. Score between 8-15:  moderate risk of alcohol related problems. Score between 16-19:  high risk of alcohol related problems. Score 20 or above:  warrants further diagnostic evaluation for alcohol dependence and treatment.   CLINICAL FACTORS:   Depression:   Aggression Delusional Impulsivity Insomnia Recent sense of peace/wellbeing Severe Schizophrenia:   Command hallucinatons Depressive state Paranoid or undifferentiated type Unstable or Poor Therapeutic Relationship Previous Psychiatric Diagnoses and Treatments Medical Diagnoses and Treatments/Surgeries     Psychiatric  Specialty Exam: Physical Exam  ROS  Blood pressure 113/78, pulse 97, temperature 97.8 F (36.6 C), temperature source Oral, resp. rate 18, height 5\' 8"  (1.727 m), weight 108.863 kg (240 lb), last menstrual period 09/18/2012.Body mass index is 36.5 kg/(m^2).     COGNITIVE FEATURES THAT CONTRIBUTE TO RISK:  Closed-mindedness, Loss of executive function, Polarized thinking and Thought constriction (tunnel vision)    SUICIDE RISK:   Moderate:  Frequent suicidal ideation with limited intensity, and duration, some specificity in terms of plans, no associated intent, good self-control, limited dysphoria/symptomatology, some risk factors present, and identifiable protective factors, including available and accessible social support.  PLAN OF CARE: Admit  Medical Decision Making:  Review of Psycho-Social Stressors (1), Review or order clinical lab tests (1), Established Problem, Worsening (2), Review of Last Therapy Session (1), Review of Medication Regimen & Side Effects (2) and Review of New Medication or Change in Dosage (2)  I certify that inpatient services furnished can reasonably be expected to improve the patient's condition.   Meeah Totino,JANARDHAHA R. 06/03/2015, 3:42 PM

## 2015-06-03 NOTE — Progress Notes (Signed)
Did not attend group 
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
## 2015-06-03 NOTE — Tx Team (Signed)
Initial Interdisciplinary Treatment Plan   PATIENT STRESSORS: Marital or family conflict   PATIENT STRENGTHS: Ability for insight Capable of independent living General fund of knowledge   PROBLEM LIST: Problem List/Patient Goals Date to be addressed Date deferred Reason deferred Estimated date of resolution  psychosis 06/03/15     "nothing" 06/03/15                                                DISCHARGE CRITERIA:  Ability to meet basic life and health needs Improved stabilization in mood, thinking, and/or behavior Verbal commitment to aftercare and medication compliance  PRELIMINARY DISCHARGE PLAN: Attend aftercare/continuing care group Return to previous living arrangement  PATIENT/FAMIILY INVOLVEMENT: This treatment plan has been presented to and reviewed with the patient, Gillermina Hu, and/or family member, .  The patient and family have been given the opportunity to ask questions and make suggestions.  [place], [place] 06/03/2015, 4:52 AM

## 2015-06-03 NOTE — Progress Notes (Signed)
D: Pt slept all morning.  "I am here because I am getting some rest, me and my husband are going through some things." Pt pleasant on approach, but guarded and short in conversation. She denies SI/HI. She also denies any physical pain. Religiosity noted. "I head God's voice talk to me all the time." Positive for auditory hallucinations. She denies depression and anxiety.   A. Encouragement and counseling provided. Remains on special checks q 15 mins for safety. Encouraged to get OOB.   R. Safety maintained, remains in bed.

## 2015-06-03 NOTE — [facility] Group Notes (Signed)
[facility] Group Notes: (Clinical Social Work)   06/03/2015      Type of Therapy:  Group Therapy   Participation Level:  Did Not Attend despite MHT prompting   Selmer Dominion, LCSW 06/03/2015, 3:42 PM

## 2015-06-03 NOTE — Progress Notes (Signed)
Patient ID: [name], female   DOB: 1971-02-12, 44 y.o.   MRN: [id number] D: Patient in room on approach reports she is doing well. Pt reports she has been in her room all day but does sleep well at night. Pt denies visual and auditory hallucination but appeared to be responding to internal stimuli. Cooperative with assessment.   A: Met with pt 1:1. Sleep medication offered but pt refused. Writer encouraged pt to discuss feelings. Pt encouraged to come to staff with any questions or concerns.   R: Patient is safe on the unit.

## 2015-06-04 LAB — HEMOGLOBIN A1C
HEMOGLOBIN A1C: 6.1 % — AB (ref 4.8–5.6)
Mean Plasma Glucose: 128 mg/dL

## 2015-06-04 MED ORDER — RISPERIDONE 1 MG PO TBDP
1.0000 mg | ORAL_TABLET | Freq: Every morning | ORAL | Status: DC
  Administered 2015-06-05: 1 mg via ORAL
  Filled 2015-06-04 (×3): qty 1

## 2015-06-04 MED ORDER — RISPERIDONE 2 MG PO TBDP
2.0000 mg | ORAL_TABLET | Freq: Every day | ORAL | Status: DC
  Administered 2015-06-04: 2 mg via ORAL
  Filled 2015-06-04 (×4): qty 1

## 2015-06-04 NOTE — Progress Notes (Signed)
NSG shift assessment. 7a-7p.   D: Pt's affect is blunted, and she is depressed appearing. She is pleasant most of the time, but she can quickly become hostile. During group she became agitated and said that she is a profit of God and was angry with a staff member. As more staff member came to assist, she said, "I'm ok" and went back to her room. Mostly she stays in her room. She takes her medications as ordered.  A: Observed pt interacting in group and in the milieu: Support and encouragement offered. Safety maintained with observations every 15 minutes.   R:  Pt continues to be delusional. She attempted to fill out a Self Inventory, but only answered the first five questions. She indicated that she is sleeping good, that her energy level is good, and her concentration is poor.

## 2015-06-04 NOTE — Plan of Care (Signed)
Problem: Alteration in thought process Goal: STG-Patient does not respond to command hallucinations Outcome: Not Progressing Pt denies A/VH but appears to be responding to internal stimuli

## 2015-06-04 NOTE — BHH Group Notes (Signed)
[facility] LCSW Aftercare Discharge Planning Group Note   06/04/2015 12:44 PM  Participation Quality:  Patient came to group but became irritable and agitated when CSW stated she did not know if she would be discharge today.  Patient left group very irate.   [name], Eulas Post
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
## 2015-06-04 NOTE — Progress Notes (Signed)
Did not attend group, was awake in bed.

## 2015-06-04 NOTE — Progress Notes (Signed)
Recreation Therapy Notes  Date: 06.06.16 Time: 9:30 am Location: [address] Group Room  Group Topic: Coping Skills  Goal Area(s) Addresses:  Patient will verbalize importance of using healthy stress management. Patient will identify positive emotions associated with healthy stress management.  Intervention: Stress Management  Activity: Healthy Relaxation Guided Imagery.  LRT introduced and educated patients on stress management technique of guided imagery.  Script was used to deliver the technique to patients.  Patients were asked to follow script read aloud by LRT to engage in practicing the stress management technique.   Education: Radiographer, therapeutic, Dentist.   Education Outcome: Acknowledges understanding/In group clarification offered/Needs additional education.   Clinical Observations/Feedback: Patient did not attend group.    Victorino Sparrow, LRT/CTRS         Victorino Sparrow A 06/04/2015 3:31 PM

## 2015-06-05 LAB — BASIC METABOLIC PANEL
Anion gap: 8 (ref 5–15)
BUN: 16 mg/dL (ref 6–20)
CO2: 27 mmol/L (ref 22–32)
Calcium: 9 mg/dL (ref 8.9–10.3)
Chloride: 103 mmol/L (ref 101–111)
Creatinine, Ser: 1.08 mg/dL — ABNORMAL HIGH (ref 0.44–1.00)
GLUCOSE: 99 mg/dL (ref 65–99)
POTASSIUM: 3.7 mmol/L (ref 3.5–5.1)
Sodium: 138 mmol/L (ref 135–145)

## 2015-06-05 LAB — LIPID PANEL
CHOL/HDL RATIO: 3.8 ratio
Cholesterol: 129 mg/dL (ref 0–200)
HDL: 34 mg/dL — AB (ref >40)
LDL Cholesterol: 74 mg/dL (ref 0–99)
Triglycerides: 106 mg/dL (ref <150)
VLDL: 21 mg/dL (ref 0–40)

## 2015-06-05 LAB — GLUCOSE, CAPILLARY: Glucose-Capillary: 101 mg/dL — ABNORMAL HIGH (ref 65–99)

## 2015-06-05 MED ORDER — AMLODIPINE BESYLATE 5 MG PO TABS
5.0000 mg | ORAL_TABLET | Freq: Every day | ORAL | Status: DC
  Administered 2015-06-05 – 2015-06-06 (×2): 5 mg via ORAL
  Filled 2015-06-05 (×5): qty 1

## 2015-06-05 MED ORDER — TRAZODONE HCL 50 MG PO TABS
50.0000 mg | ORAL_TABLET | Freq: Every evening | ORAL | Status: DC | PRN
  Administered 2015-06-05 – 2015-06-08 (×4): 50 mg via ORAL
  Filled 2015-06-05 (×11): qty 1

## 2015-06-05 MED ORDER — HYDROCHLOROTHIAZIDE 25 MG PO TABS
25.0000 mg | ORAL_TABLET | Freq: Every day | ORAL | Status: DC
  Administered 2015-06-05 – 2015-06-11 (×7): 25 mg via ORAL
  Filled 2015-06-05 (×11): qty 1

## 2015-06-05 MED ORDER — RISPERIDONE 2 MG PO TBDP
2.0000 mg | ORAL_TABLET | Freq: Two times a day (BID) | ORAL | Status: DC
  Administered 2015-06-06: 2 mg via ORAL
  Filled 2015-06-05 (×5): qty 1

## 2015-06-05 NOTE — BHH Suicide Risk Assessment (Signed)
B[facility] INPATIENT:  Family/Significant Other Suicide Prevention Education  Suicide Prevention Education:  Patient Refusal for Family/Significant Other Suicide Prevention Education: The patient [name] has refused to provide written consent for family/significant other to be provided Family/Significant Other Suicide Prevention Education during admission and/or prior to discharge.  Physician notified.  Concha Pyo 06/05/2015, 11:24 AM

## 2015-06-05 NOTE — BHH Group Notes (Signed)
[facility] LCSW Group Therapy  06/05/2015 4:19 PM  Type of Therapy:  Group Therapy  Participation Level:  Did Not Attend [name] 06/05/2015, 4:19 PM

## 2015-06-05 NOTE — Progress Notes (Signed)
Recreation Therapy Notes  Animal-Assisted Activity (AAA) Program Checklist/Progress Notes Patient Eligibility Criteria Checklist & Daily Group note for Rec Tx Intervention  Date: 06.07.16 Time: 2:30pm Location: [address]   AAA/T Program Assumption of Risk Form signed by Patient/ or Parent Legal Guardian yes  Patient is free of allergies or sever asthma yes  Patient reports no fear of animals yes  Patient reports no history of cruelty to animals yes  Patient understands his/her participation is voluntary yes  Patient washes hands before animal contact yes  Patient washes hands after animal contact yes  Education: Hand Washing, Appropriate Animal Interaction   Education Outcome: Acknowledges understanding/In group clarification offered/Needs additional education.   Clinical Observations/Feedback: Patient did not attend group.   Victorino Sparrow, LRT/CTRS         Victorino Sparrow A 06/05/2015 4:08 PM
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
## 2015-06-05 NOTE — Progress Notes (Signed)
Patient ID: [name], female   DOB: 03-Aug-1971, 44 y.o.   MRN: [id number]  EKG performed and placed on chart. NP May A. Viewed the EKG.

## 2015-06-05 NOTE — Plan of Care (Signed)
Problem: Ineffective individual coping Goal: STG: Patient will remain free from self harm Outcome: Progressing Patient remains free from self harm at this time

## 2015-06-05 NOTE — Progress Notes (Signed)
D: Pt denies any SI/HI/AVH. Pt remained in her room for the majority of the shift. Pt was invited to group, but did not attend. Pt voiced familiarity with her scheduled medication Risperdal. Pt denied any physical concerns. Pt appeared disheveled. Pt is compliant with her current POC.  A: Writer administered scheduled medications to pt, per MD orders. Continued support and availability as needed was extended to this pt. Staff continue to monitor pt with q65min checks.  R: No adverse drug reactions noted. Pt receptive to treatment. Pt remains safe at this time.

## 2015-06-05 NOTE — Progress Notes (Signed)
Patient ID: [name], female   DOB: May 20, 1971, 44 y.o.   MRN: [id number] [facility] MD Progress Note  06/05/2015 7:38 PM [name]  MRN:  [id number] Subjective:  Patient minimizes any symptoms. States "I'm okay". States that she is "okay with being in the hospital".  Denies medication side effects. Objective: I have discussed case with treatment team in rounds.  As discussed with staff, patient tends to keep to self, limited interaction with peers or staff. She was observed praying loudly and using nonsensical words by nursing earlier today.  Patient continues to state that her husband is a homosexual but states that today she's less bothered by this. She may be exhibiting a somewhat improved insight as she states " I know I need to be in the hospital". She denies medication side effects and is not presenting with akithisia or involuntary movements.  She minimizes all symptoms and states that she's okay.  As noted, she has had prior admissions with very similar presentation.  Potassium level WNL and creatinine improved. Unremarkable lipid panel except for low HDL.  Principal Problem: Schizophrenia by history  Diagnosis:   Patient Active Problem List   Diagnosis Date Noted  . 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 Delusional disorder(297.1) [F22] 07/20/2014  . Paranoid schizophrenia [F20.0] 07/20/2014  . Psychosis [F29] 07/19/2014  . 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 Contact dermatitis [L25.9] 01/18/2013  . Vaginitis [N76.0] 01/18/2013  . Essential hypertension, benign [I10] 12/24/2012  . Heartburn [R12] 12/24/2012  . Hypothyroidism [E03.9] 12/24/2012  . 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 Acute DVT (deep venous thrombosis) [I82.409] 09/27/2012  . Chronic kidney disease [N18.9] 08/28/2012  . Fibroid uterus [D25.9] 06/12/2012   Total Time spent with patient: 25 minutes     Past Medical History:  Past Medical History  Diagnosis Date  . Thyroid disease   . Fibroid tumor   . DVT (deep venous thrombosis) 09/30/12    in upper extremities b/l and LLE  . Anemia   . Thyroid nodule     s/p biopsy  . Graves disease   . Multinodular goiter 10/01/2012  . Lupus anticoagulant positive 10/02/2012  . Hypertension   . 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 Arnoldo Morale on 12/13/2012.  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 Paranoid schizophrenia   . Delusional disorder     Past Surgical History  Procedure Laterality Date  . Supracervical abdominal hysterectomy  09/27/2012    Procedure: HYSTERECTOMY SUPRACERVICAL ABDOMINAL;  Surgeon: Jonnie Kind, MD;  Location: [facility];  Service: Gynecology;  Laterality: N/A;  . Hematoma evacuation  10/03/2012    Procedure: EVACUATION HEMATOMA;  Surgeon: Jonnie Kind, MD;  Location: [facility];  Service: Gynecology;  Laterality: N/A;  . Thyroidectomy  12/13/2012    Procedure: THYROIDECTOMY;  Surgeon: Jamesetta So, MD;  Location: [facility];  Service: General;  Laterality: Bilateral;  . Abdominal hysterectomy     Family History:  Family History  Problem Relation Age of Onset  . Diabetes Mother   . Hypertension Mother   . Cancer Father     prostate   . Cancer Sister     pancreatic cancer   Social History:  History  Alcohol Use No     History  Drug Use No    History   Social History  . Marital Status: Married  Spouse Name: N/A  . Number of Children: N/A  . Years of Education: N/A   Social History Main Topics  . Smoking status: Never Smoker   . Smokeless tobacco: Never Used  . Alcohol Use: No  . Drug Use: No  . Sexual Activity: Yes    Birth Control/ Protection: Surgical   Other Topics Concern  . None   Social History Narrative   Additional History:    Sleep: fair   Appetite: good    Assessment:   Musculoskeletal: Strength & Muscle Tone: within  normal limits Gait & Station: normal Patient leans: N/A   Psychiatric Specialty Exam: Physical Exam  ROS does not endorse akathisia, does not endorse involuntary movements.  Blood pressure 141/99, pulse 101, temperature 98.5 F (36.9 C), temperature source Oral, resp. rate 18, height 5' 8"  (1.727 m), weight 240 lb (108.863 kg), last menstrual period 09/18/2012.Body mass index is 36.5 kg/(m^2).  General Appearance: Fairly Groomed  Engineer, water::  Good  Speech:  Normal Rate  Volume:  Normal  Mood:  Less irritable today  Affect:  Still flat  Thought Process:  Linear  Orientation:  Other:  fully alert and attentive   Thought Content:  Denies hallucinations but as noted, has period of chanting/praying loudly at times. Remains delusional but seems less fixated on delusions today.  Suicidal Thoughts:  No at this time denies thoughts of hurting self or husband   Homicidal Thoughts:  No  Memory:  Recent and remote grossly intact   Judgement:  Impaired  Insight:  Lacking  Psychomotor Activity:  decreased  Concentration:  Good  Recall:  Good  Fund of Knowledge:Good  Language: Good  Akathisia:  Negative  Handed:  Right  AIMS (if indicated):     Assets:  Desire for Improvement Resilience  ADL's:fair   Cognition: WNL  Sleep:  Number of Hours: 5.25     Current Medications: Current Facility-Administered Medications  Medication Dose Route Frequency Provider Last Rate Last Dose  . acetaminophen (TYLENOL) tablet 650 mg  650 mg Oral Q6H PRN Dara Hoyer, PA-C      . alum & mag hydroxide-simeth (MAALOX/MYLANTA) 200-200-20 MG/5ML suspension 30 mL  30 mL Oral Q4H PRN Dara Hoyer, PA-C      . amLODipine (NORVASC) tablet 5 mg  5 mg Oral Daily Kerrie Buffalo, NP   5 mg at 06/05/15 1247  . clonazePAM (KLONOPIN) tablet 0.5 mg  0.5 mg Oral BID Ambrose Finland, MD   0.5 mg at 06/05/15 1711  . hydrochlorothiazide (HYDRODIURIL) tablet 25 mg  25 mg Oral Daily Kerrie Buffalo, NP   25 mg at  06/05/15 1247  . magnesium hydroxide (MILK OF MAGNESIA) suspension 30 mL  30 mL Oral Daily PRN Dara Hoyer, PA-C      . risperiDONE (RISPERDAL M-TABS) disintegrating tablet 1 mg  1 mg Oral q morning - 10a Jenne Campus, MD   1 mg at 06/05/15 1017  . risperiDONE (RISPERDAL M-TABS) disintegrating tablet 2 mg  2 mg Oral QHS Jenne Campus, MD   2 mg at 06/04/15 2201    Lab Results:  Results for orders placed or performed during the hospital encounter of 06/03/15 (from the past 48 hour(s))  Lipid panel     Status: Abnormal   Collection Time: 06/05/15  6:10 AM  Result Value Ref Range   Cholesterol 129 0 - 200 mg/dL   Triglycerides 106 <150 mg/dL   HDL 34 (L) >40 mg/dL   Total  CHOL/HDL Ratio 3.8 RATIO   VLDL 21 0 - 40 mg/dL   LDL Cholesterol 74 0 - 99 mg/dL    Comment:        Total Cholesterol/HDL:CHD Risk Coronary Heart Disease Risk Table                     Men   Women  1/2 Average Risk   3.4   3.3  Average Risk       5.0   4.4  2 X Average Risk   9.6   7.1  3 X Average Risk  23.4   11.0        Use the calculated Patient Ratio above and the CHD Risk Table to determine the patient's CHD Risk.        ATP III CLASSIFICATION (LDL):  <100     mg/dL   Optimal  100-129  mg/dL   Near or Above                    Optimal  130-159  mg/dL   Borderline  160-189  mg/dL   High  >190     mg/dL   Very High Performed at [facility] metabolic panel     Status: Abnormal   Collection Time: 06/05/15  6:10 AM  Result Value Ref Range   Sodium 138 135 - 145 mmol/L   Potassium 3.7 3.5 - 5.1 mmol/L   Chloride 103 101 - 111 mmol/L   CO2 27 22 - 32 mmol/L   Glucose, Bld 99 65 - 99 mg/dL   BUN 16 6 - 20 mg/dL   Creatinine, Ser 1.08 (H) 0.44 - 1.00 mg/dL   Calcium 9.0 8.9 - 10.3 mg/dL   GFR calc non Af Amer >60 >60 mL/min   GFR calc Af Amer >60 >60 mL/min    Comment: (NOTE) The eGFR has been calculated using the CKD EPI equation. This calculation has not been validated in  all clinical situations. eGFR's persistently <60 mL/min signify possible Chronic Kidney Disease.    Anion gap 8 5 - 15    Comment: Performed at [facility]  Glucose, capillary     Status: Abnormal   Collection Time: 06/05/15  6:15 AM  Result Value Ref Range   Glucose-Capillary 101 (H) 65 - 99 mg/dL   Comment 1 Notify RN     Physical Findings: AIMS: Facial and Oral Movements Muscles of Facial Expression: None, normal Lips and Perioral Area: None, normal Jaw: None, normal Tongue: None, normal,Extremity Movements Upper (arms, wrists, hands, fingers): None, normal Lower (legs, knees, ankles, toes): None, normal, Trunk Movements Neck, shoulders, hips: None, normal, Overall Severity Severity of abnormal movements (highest score from questions above): None, normal Incapacitation due to abnormal movements: None, normal Patient's awareness of abnormal movements (rate only patient's report): No Awareness, Dental Status Current problems with teeth and/or dentures?: No Does patient usually wear dentures?: No  CIWA:    COWS:      Assessment - Patient still flat in affect, limited interaction with others and delusion but some improvement noted in so far as not being agitated and being compliant with medication. Tolerated medication thus far. Hypokalemia now corrected.   Treatment Plan Summary: Daily contact with patient to assess and evaluate symptoms and progress in treatment, Medication management, Plan continue inpatient treatment and continue medications as below  Continue Klonopin 0.5 mgrs BID for anxiety, agitation Increase Risperidone to 2 mg  BID for management of psychosis. Dietary consult to address low HDL, obesity Continue Norvasc/HCTZ for hypertension   Medical Decision Making:  Established Problem, Stable/Improving (1), Review of Psycho-Social Stressors (1), Review or order clinical lab tests (1) and Review of Medication Regimen & Side Effects  (2)     COBOS, FERNANDO 06/05/2015, 7:38 PM

## 2015-06-05 NOTE — Clinical Social Work Note (Signed)
CSW met with patient to complete PSA and discuss discharge planning. Patient agreed to referral for outpatient treatment with [facility]. CSW spoke with Ruby who advised patient has no showed on two occasions. She shared she will consult with Dr. Harrington Challenger about rescheduling patient and call back. Patient declined to allow contact collateral contact.

## 2015-06-05 NOTE — Progress Notes (Signed)
Patient ID: [name], female   DOB: Apr 07, 1971, 44 y.o.   MRN: [id number]  DAR: Pt. Denies SI/HI and A/V Hallucinations however patient was loudly "praying" in her room. Per observation patient held her arms in front of her and was chanting in a nonsensical manner. Patient was asked to lower the level of her voice and patient was cooperative. Patient does appear preoccupied although patient denies any A/V hallucinations. Patient does not report any pain or discomfort at this time. .Support and encouragement provided to the patient. Scheduled medications administered to patient per physician's orders. Patient is cooperative but bizarre in behavior. Q15 minute checks are maintained for safety.

## 2015-06-05 NOTE — BHH Group Notes (Signed)
[facility] Group Notes:  recovery  Date:  06/05/2015  Time:  5:01 PM  Type of Therapy:  Nurse Education  Participation Level: did attend  Participation Quality:  Inattentive  Affect:  Blunted  Cognitive:  Lacking  Insight:  Good  Engagement in Group:  Engaged  Modes of Intervention:  Discussion  Summary of Progress/Problems:  Delman Kitten 06/05/2015, 5:01 PM

## 2015-06-06 DIAGNOSIS — N183 Chronic kidney disease, stage 3 (moderate): Secondary | ICD-10-CM

## 2015-06-06 DIAGNOSIS — E038 Other specified hypothyroidism: Secondary | ICD-10-CM

## 2015-06-06 DIAGNOSIS — I10 Essential (primary) hypertension: Secondary | ICD-10-CM

## 2015-06-06 DIAGNOSIS — F200 Paranoid schizophrenia: Principal | ICD-10-CM

## 2015-06-06 MED ORDER — BENZTROPINE MESYLATE 0.5 MG PO TABS
0.5000 mg | ORAL_TABLET | Freq: Two times a day (BID) | ORAL | Status: DC
  Administered 2015-06-06 – 2015-06-11 (×10): 0.5 mg via ORAL
  Filled 2015-06-06 (×14): qty 1

## 2015-06-06 MED ORDER — METOPROLOL TARTRATE 50 MG PO TABS
50.0000 mg | ORAL_TABLET | Freq: Two times a day (BID) | ORAL | Status: DC
Start: 2015-06-06 — End: 2015-06-11
  Administered 2015-06-06 – 2015-06-11 (×10): 50 mg via ORAL
  Filled 2015-06-06 (×15): qty 1

## 2015-06-06 MED ORDER — RISPERIDONE 2 MG PO TBDP
2.0000 mg | ORAL_TABLET | Freq: Once | ORAL | Status: AC
  Administered 2015-06-06: 2 mg via ORAL
  Filled 2015-06-06: qty 1

## 2015-06-06 MED ORDER — METFORMIN HCL 500 MG PO TABS
500.0000 mg | ORAL_TABLET | Freq: Two times a day (BID) | ORAL | Status: DC
  Administered 2015-06-06 – 2015-06-11 (×10): 500 mg via ORAL
  Filled 2015-06-06 (×14): qty 1

## 2015-06-06 MED ORDER — AMLODIPINE BESYLATE 10 MG PO TABS
10.0000 mg | ORAL_TABLET | Freq: Every day | ORAL | Status: DC
  Administered 2015-06-07 – 2015-06-11 (×5): 10 mg via ORAL
  Filled 2015-06-06 (×3): qty 1
  Filled 2015-06-06: qty 2
  Filled 2015-06-06 (×4): qty 1

## 2015-06-06 MED ORDER — CLONIDINE HCL 0.1 MG PO TABS: 0.1000 mg | ORAL_TABLET | Freq: Four times a day (QID) | ORAL | Status: DC | PRN

## 2015-06-06 MED ORDER — HYDRALAZINE HCL 50 MG PO TABS
50.0000 mg | ORAL_TABLET | Freq: Three times a day (TID) | ORAL | Status: DC
  Administered 2015-06-06 – 2015-06-11 (×10): 50 mg via ORAL
  Filled 2015-06-06 (×20): qty 1
  Filled 2015-06-06: qty 2
  Filled 2015-06-06: qty 1

## 2015-06-06 MED ORDER — LEVOTHYROXINE SODIUM 125 MCG PO TABS
125.0000 ug | ORAL_TABLET | Freq: Every day | ORAL | Status: DC
  Administered 2015-06-07 – 2015-06-11 (×5): 125 ug via ORAL
  Filled 2015-06-06 (×7): qty 1

## 2015-06-06 MED ORDER — RISPERIDONE 1 MG PO TBDP
3.0000 mg | ORAL_TABLET | Freq: Two times a day (BID) | ORAL | Status: DC
  Administered 2015-06-06 – 2015-06-09 (×6): 3 mg via ORAL
  Filled 2015-06-06 (×8): qty 3

## 2015-06-06 NOTE — Progress Notes (Signed)
Nutrition Brief Note  RD consulted for deit education regarding elevated HgbA1c and high BMI of 37.   Pt not appropriate for education at this time as pt noted to be aggressive and threatening this AM.  RD will attempt consult at a later time when pt more stable and ready for education.  Body mass index is 36.5 kg/(m^2). Patient meets criteria for obesity based on current BMI.   Diet Order: Diet heart healthy/carb modified Room service appropriate?: Yes; Fluid consistency:: Thin Pt is also offered choice of unit snacks mid-morning and mid-afternoon.  Pt is eating as desired.    Labs and medications reviewed.   Clayton Bibles, MS, RD, LDN Pager: [phone] After Hours Pager: [phone]

## 2015-06-06 NOTE — Clinical Social Work Note (Signed)
Call from [facility] at [facility] advising Dr. Harrington Challenger is not willing to reschedule with patient due to patient having no-showed for two appointments.  Patient will be referred to [facility].

## 2015-06-06 NOTE — Progress Notes (Signed)
D: Pt denies SI/HI/AVH. Pt is pleasant and cooperative. Pt very suspicious of staff, especially about getting her EKG. Pt not seen much interacting on the unit this evening.   A: Pt was offered support and encouragement. Pt was given scheduled medications. Pt was encourage to attend groups. Q 15 minute checks were done for safety.   R:Pt attends groups and interacts well with peers and staff. Pt is taking medication. Pt has no complaints at this time .Pt receptive to treatment and safety maintained on unit.

## 2015-06-06 NOTE — BHH Group Notes (Signed)
[facility] LCSW Group Therapy  06/06/2015 3:35 PM  Type of Therapy:  Group Therapy  Participation Level:  Did Not Attend   Summary of Progress/Problems:  Concha Pyo 06/06/2015, 3:35 PM
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
## 2015-06-06 NOTE — BHH Group Notes (Signed)
[facility] LCSW Aftercare Discharge Planning Group Note   06/06/2015 9:42 AM    Participation Quality:  Patient refused to come to group.   Jehiel Koepp, Eulas Post

## 2015-06-06 NOTE — Consult Note (Signed)
Patient Demographics  [name], is a 44 y.o. female   MRN: [id number]   DOB - 03-18-1971  Admit Date - 06/03/2015    Outpatient Primary MD for the patient is Delphina Cahill, MD  Consult requested in the Hospital by Ursula Alert, MD, On 06/06/2015    Reason for consult HTN   With History of -  Past Medical History  Diagnosis Date  . Thyroid disease   . Fibroid tumor   . DVT (deep venous thrombosis) 09/30/12    in upper extremities b/l and LLE  . Anemia   . Thyroid nodule     s/p biopsy  . Graves disease   . Multinodular goiter 10/01/2012  . Lupus anticoagulant positive 10/02/2012  . Hypertension   . 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 Arnoldo Morale on 12/13/2012.  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 Paranoid schizophrenia   . Delusional disorder       Past Surgical History  Procedure Laterality Date  . Supracervical abdominal hysterectomy  09/27/2012    Procedure: HYSTERECTOMY SUPRACERVICAL ABDOMINAL;  Surgeon: Jonnie Kind, MD;  Location: [facility];  Service: Gynecology;  Laterality: N/A;  . Hematoma evacuation  10/03/2012    Procedure: EVACUATION HEMATOMA;  Surgeon: Jonnie Kind, MD;  Location: [facility];  Service: Gynecology;  Laterality: N/A;  . Thyroidectomy  12/13/2012    Procedure: THYROIDECTOMY;  Surgeon: Jamesetta So, MD;  Location: [facility];  Service: General;  Laterality: Bilateral;  . Abdominal hysterectomy      HPI - surgery by psychiatrist Dr. Parke Poisson over the phone for management of hypertension for this patient was been [facility] for management of psychosis, patient has history of essential hypertension, CK D stage II baseline creatinine 1.3, hypothyroidism, DM type II , alcohol abuse and smoking.    Patient since admission has been running high blood pressures and I was called to give my opinion on blood pressure management and medication adjustment for blood pressure control.    Social History History  Substance Use Topics  . Smoking status: Never Smoker   . Smokeless tobacco: Never Used  . Alcohol Use: No      Family History Family History  Problem Relation Age of Onset  . Diabetes Mother   . Hypertension Mother   . Cancer Father     prostate   . Cancer Sister     pancreatic cancer      Prior to Admission medications   Medication Sig Start Date End Date Taking? Authorizing Provider  amLODipine (NORVASC) 5 MG tablet Take 1 tablet (5 mg total) by mouth daily. Patient not taking: Reported on 06/02/2015 07/26/14   Niel Hummer, NP  benztropine (COGENTIN) 0.5 MG tablet Take 1 tablet (0.5 mg total) by mouth 2 (two) times daily. Patient not taking: Reported on 06/02/2015 08/13/14   Delfin Gant, NP  ferrous sulfate 325 (65 FE) MG tablet  04/06/15   Historical Provider, MD  hydrochlorothiazide (HYDRODIURIL) 25 MG tablet  05/03/15   Historical Provider, MD  levothyroxine (SYNTHROID) 125 MCG tablet Take 1 tablet (125 mcg total) by mouth daily. 07/26/14   Niel Hummer, NP  metFORMIN (GLUCOPHAGE-XR) 500 MG 24 hr tablet  05/25/15   Historical Provider, MD  risperiDONE (RISPERDAL) 2 MG tablet Take 1 tablet (2 mg total) by mouth 2 (two) times daily. Patient not taking: Reported on 06/02/2015 08/13/14   Delfin Gant, NP  traZODone (DESYREL) 100 MG tablet Take 1 tablet (100 mg total) by mouth at bedtime as needed for sleep. Patient not taking: Reported on 06/02/2015 08/13/14   Delfin Gant, NP  Vitamin D, Ergocalciferol, (DRISDOL) 50000 UNITS CAPS capsule  05/15/15   Historical Provider, MD    Anti-infectives    None      Scheduled Meds: . [START ON 06/07/2015] amLODipine  10 mg Oral Daily  . benztropine  0.5 mg Oral BID  . clonazePAM  0.5 mg Oral BID  . hydrALAZINE  50 mg  Oral 3 times per day  . hydrochlorothiazide  25 mg Oral Daily  . [START ON 06/07/2015] levothyroxine  125 mcg Oral QAC breakfast  . metoprolol tartrate  50 mg Oral BID  . risperiDONE  3 mg Oral BID  . traZODone  50 mg Oral QHS,MR X 1   Continuous Infusions:  PRN Meds:.acetaminophen, alum & mag hydroxide-simeth, cloNIDine, magnesium hydroxide  No Known Allergies  Physical Exam  Vitals  Blood pressure 143/105, pulse 126, temperature 98.6 F (37 C), temperature source Oral, resp. rate 18, height 5\' 8"  (1.727 m), weight 108.863 kg (240 lb), last menstrual period 09/18/2012.      Data Review  CBC  Recent Labs Lab 06/02/15 1838  WBC 5.3  HGB 14.2  HCT 42.4  PLT 213  MCV 84.3  MCH 28.2  MCHC 33.5  RDW 14.9   ------------------------------------------------------------------------------------------------------------------  Chemistries   Recent Labs Lab 06/02/15 1838 06/05/15 0610  NA 140 138  K 3.1* 3.7  CL 101 103  CO2 29 27  GLUCOSE 126* 99  BUN 24* 16  CREATININE 1.66* 1.08*  CALCIUM 9.3 9.0  AST 26  --   ALT 49  --   ALKPHOS 73  --   BILITOT 0.5  --    ------------------------------------------------------------------------------------------------------------------ estimated creatinine clearance is 85 mL/min (by C-G formula based on Cr of 1.08). ------------------------------------------------------------------------------------------------------------------ No results for input(s): TSH, T4TOTAL, T3FREE, THYROIDAB in the last 72 hours.  Invalid input(s): FREET3   Coagulation profile No results for input(s): INR, PROTIME in the last 168 hours. ------------------------------------------------------------------------------------------------------------------- No results for input(s): DDIMER in the last 72 hours. -------------------------------------------------------------------------------------------------------------------  Cardiac Enzymes No  results for input(s): CKMB, TROPONINI, MYOGLOBIN in the last 168 hours.  Invalid input(s): CK ------------------------------------------------------------------------------------------------------------------ Invalid input(s): POCBNP   ---------------------------------------------------------------------------------------------------------------  Urinalysis    Component Value Date/Time   COLORURINE YELLOW 01/12/2015 2217   APPEARANCEUR HAZY* 01/12/2015 2217   LABSPEC 1.025 01/12/2015 2217   PHURINE 6.5 01/12/2015 2217   GLUCOSEU NEGATIVE 01/12/2015 2217   HGBUR TRACE* 01/12/2015 2217   BILIRUBINUR MODERATE* 01/12/2015 2217   KETONESUR 15* 01/12/2015 2217   PROTEINUR 30* 01/12/2015 2217   UROBILINOGEN 1.0 01/12/2015 2217   NITRITE NEGATIVE 01/12/2015 2217   LEUKOCYTESUR NEGATIVE 01/12/2015 2217     Imaging results:   No results found.       Assessment & Plan    1.HTN - poor control, on HCTZ which will be  continued. Have increased Norvasc to 10 mg, added Lopressor 50 twice a day along with hydralazine 50mg   3 times a day. Catapres for breakthrough blood pressure with parameters. Have requested Dr. Parke Poisson to feel free to call with questions. Once acute issues have resolved can follow with PCP and get ACE inhibitor added with close renal function monitoring.   2. Hypothyroidism. Continue home dose Synthroid.    3. DM type II A1c 6.1. Continue carb modified diet and Glucophage.    4. CK D stage III. At baseline.    Thank you for the consult, we will follow the patient with you in the Hospital.   Lala Lund K M.D on 06/06/2015 at 3:14 PM  Between 7am to 7pm - Pager - [phone]  After 7pm go to [email or web address] - password [email or web address]   Thank you for the consult, please call with questions   [facility]   Office  [phone]
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
## 2015-06-06 NOTE — Progress Notes (Signed)
Recreation Therapy Notes  Date: 06.08.16 Time: 9:30am Location: [address] Group room  Group Topic: Stress Management  Goal Area(s) Addresses:  Patient will verbalize importance of using healthy stress management.  Patient will identify positive emotions associated with healthy stress management.   Intervention: Stress Management  Activity :  Guided Imagery.  LRT introduced and educated patients on stress management technique of guided imagery.  A script was used to deliver the technique to patients.  Patients were asked to follow script read by LRT to engage in practicing the stress management technique.  Education:  Stress Management, Discharge Planning.   Education Outcome: Acknowledges edcuation/In group clarification offered/Needs additional education  Clinical Observations/Feedback: Patient did not attend group.   Victorino Sparrow, LRT/CTRS         Ria Comment, Elhadji Pecore A 06/06/2015 2:28 PM

## 2015-06-06 NOTE — Progress Notes (Signed)
Pt came back to the nursing station yelling:  "you have AIDS, GOD said you have AIDS because you sleep with so many women around here"

## 2015-06-07 LAB — GLUCOSE, CAPILLARY: Glucose-Capillary: 102 mg/dL — ABNORMAL HIGH (ref 65–99)

## 2015-06-07 NOTE — Progress Notes (Signed)
The patient attended this evening's Karaoke group and was appropriate and quiet.
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
## 2015-06-07 NOTE — [facility] Group Notes (Signed)
[facility] Group Notes:  (Counselor/Nursing/MHT/Case Management/Adjunct)  06/07/2015 1:15PM  Type of Therapy:  Group Therapy  Participation Level: Invited.  Chose to not attend  Summary of Progress/Problems: The topic for group was balance in life.  Pt participated in the discussion about when their life was in balance and out of balance and how this feels.  Pt discussed ways to get back in balance and short term goals they can work on to get where they want to be.    Trish Mage 06/07/2015 4:48 PM

## 2015-06-07 NOTE — Progress Notes (Signed)
D: Pt denies SI/HI/AV. Pt is paranoid and think we a not giving her the right medications but she is just taking them to get out of here. Pt is pleasant but withdrawn to room. Pt rates depression at a 0, anxiety at a 0, and Helplessness/hopelessness at a 0.  A: Pt was offered support and encouragement. Pt was given scheduled medications. Pt was encourage to attend groups. Q 15 minute checks were done for safety.  R:Pt does not groups or interact with peers and staff. Pt taking medication. Pt has no complaints besides she feels like she is on the wrong medications as that is part of her delusions.Pt not receptive to treatment and safety maintained on unit.

## 2015-06-07 NOTE — [facility] Group Notes (Signed)
[facility] Group Notes:  (Nursing/MHT/Case Management/Adjunct)  Date:  06/07/2015  Time:  0900  Type of Therapy:  Nurse Education  Participation Level:  Did Not Attend  Carolanne Grumbling 06/07/2015, 6:33 PM

## 2015-06-07 NOTE — Progress Notes (Signed)
D: Pt was calm and cooperative this evening. Pt remained in her bed  for the majority of the shift. Pt denied any SI/HI/AVH. Pt denied any physical concerns. Pt had no concerns she wished for this writer to address at this time.  A: Writer administered scheduled medications to pt, per MD orders. Continued support and availability as needed was extended to this pt. Staff continue to monitor pt with q59min checks.  R: No adverse drug reactions noted. Pt receptive to treatment. Pt remains safe at this time.

## 2015-06-08 LAB — GLUCOSE, CAPILLARY: GLUCOSE-CAPILLARY: 108 mg/dL — AB (ref 65–99)

## 2015-06-08 NOTE — Progress Notes (Signed)
Patient ID: [name], female   DOB: 1971/04/22, 44 y.o.   MRN: [id number] [facility] MD Progress Note  06/08/2015 6:19 PM ADONICA FUKUSHIMA  MRN:  [id number] Subjective: Patient reports she is " about the same". She denies medication side effects. She states she does not think she needs the medication but takes it as it helps her feel better and more relaxed.  Objective: I have discussed case with treatment team in rounds.  She remains delusional and fixated on delusions regarding her husband being homosexual and on religious preoccupations. Delusional thoughts have tended to encompass other persons as well, and has stated her father and brother are also homosexual . As noted, also has religious preoccupations, and states she is " a prophet ".  Today she  states " God told me you do not want to discharge me because you had a girlfriend who was homosexual so she broke your heart" . Of note,however, she has been less irritable, more visible in day room, and less isolated.  She has not been disruptive or threatening, violent on unit. She denies any thoughts of violence or HI towards husband , family members or anyone else, and states " all I want is to separate from him, so he can do what he wants to do and I can live my live according to God's precepts ". Denies medication side effects, no akathisia, no EPS noted. BP improved with antihypertensive medication adjustment. Patient denies sadness, depression or any neuro-vegetative symptoms of depression.   Principal Problem: Schizophrenia by history  Diagnosis:   Patient Active Problem List   Diagnosis Date Noted  . 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 Delusional disorder(297.1) [F22] 07/20/2014  . Paranoid schizophrenia [F20.0] 07/20/2014  . Psychosis [F29] 07/19/2014  . 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 Contact dermatitis [L25.9] 01/18/2013  . Vaginitis  [N76.0] 01/18/2013  . Essential hypertension, benign [I10] 12/24/2012  . Heartburn [R12] 12/24/2012  . Hypothyroidism [E03.9] 12/24/2012  . 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 Acute DVT (deep venous thrombosis) [I82.409] 09/27/2012  . Chronic kidney disease [N18.9] 08/28/2012  . Fibroid uterus [D25.9] 06/12/2012   Total Time spent with patient: 25 minutes    Past Medical History:  Past Medical History  Diagnosis Date  . Thyroid disease   . Fibroid tumor   . DVT (deep venous thrombosis) 09/30/12    in upper extremities b/l and LLE  . Anemia   . Thyroid nodule     s/p biopsy  . Graves disease   . Multinodular goiter 10/01/2012  . Lupus anticoagulant positive 10/02/2012  . Hypertension   . 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 Arnoldo Morale on 12/13/2012.  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 Paranoid schizophrenia   . Delusional disorder     Past Surgical History  Procedure Laterality Date  . Supracervical abdominal hysterectomy  09/27/2012    Procedure: HYSTERECTOMY SUPRACERVICAL ABDOMINAL;  Surgeon: Jonnie Kind, MD;  Location: [facility];  Service: Gynecology;  Laterality: N/A;  . Hematoma evacuation  10/03/2012    Procedure: EVACUATION HEMATOMA;  Surgeon: Jonnie Kind, MD;  Location: [facility];  Service: Gynecology;  Laterality: N/A;  . Thyroidectomy  12/13/2012    Procedure: THYROIDECTOMY;  Surgeon: Jamesetta So, MD;  Location: [facility];  Service: General;  Laterality: Bilateral;  . Abdominal hysterectomy     Family  History:  Family History  Problem Relation Age of Onset  . Diabetes Mother   . Hypertension Mother   . Cancer Father     prostate   . Cancer Sister     pancreatic cancer   Social History:  History  Alcohol Use No     History  Drug Use No    History   Social History  . Marital Status: Married    Spouse Name: N/A  . Number of Children: N/A  . Years of Education: N/A   Social  History Main Topics  . Smoking status: Never Smoker   . Smokeless tobacco: Never Used  . Alcohol Use: No  . Drug Use: No  . Sexual Activity: Yes    Birth Control/ Protection: Surgical   Other Topics Concern  . None   Social History Narrative   Additional History:    Sleep: improved   Appetite: good    Assessment:   Musculoskeletal: Strength & Muscle Tone: within normal limits Gait & Station: normal Patient leans: N/A   Psychiatric Specialty Exam: Physical Exam  ROS does not endorse akathisia, does not endorse involuntary movements. No headache, no chest pain.   Blood pressure 123/84, pulse 104, temperature 98.3 F (36.8 C), temperature source Oral, resp. rate 16, height 5\' 8"  (1.727 m), weight 240 lb (108.863 kg), last menstrual period 09/18/2012.Body mass index is 36.5 kg/(m^2).  General Appearance: Fairly Groomed  Engineer, water::  Good  Speech:  Normal Rate  Volume:  Normal  Mood: not depressed   Affect:  Blunted, slightly irritable at times    Thought Process:  Less focused  on delusional content, but delusions still present   Orientation:  Other:  fully alert and attentive   Thought Content:  Denies hallucinations  - not internally preoccupied, still delusional - see above   Suicidal Thoughts:  No at this time denies thoughts of hurting self or husband   Homicidal Thoughts:  No  Memory:  Recent and remote grossly intact   Judgement:  Impaired  Insight:  Lacking  Psychomotor Activity:  Improved   Concentration:  Good  Recall:  Good  Fund of Knowledge:Good  Language: Good  Akathisia:  Negative  Handed:  Right  AIMS (if indicated):     Assets:  Desire for Improvement Resilience  ADL's:fair   Cognition: WNL  Sleep:  Number of Hours: 6.25     Current Medications: Current Facility-Administered Medications  Medication Dose Route Frequency Provider Last Rate Last Dose  . acetaminophen (TYLENOL) tablet 650 mg  650 mg Oral Q6H PRN Dara Hoyer, PA-C       . alum & mag hydroxide-simeth (MAALOX/MYLANTA) 200-200-20 MG/5ML suspension 30 mL  30 mL Oral Q4H PRN Dara Hoyer, PA-C      . amLODipine (NORVASC) tablet 10 mg  10 mg Oral Daily Thurnell Lose, MD   10 mg at 06/08/15 0809  . benztropine (COGENTIN) tablet 0.5 mg  0.5 mg Oral BID Jenne Campus, MD   0.5 mg at 06/08/15 1705  . clonazePAM (KLONOPIN) tablet 0.5 mg  0.5 mg Oral BID Ambrose Finland, MD   0.5 mg at 06/08/15 1705  . cloNIDine (CATAPRES) tablet 0.1 mg  0.1 mg Oral Q6H PRN Thurnell Lose, MD      . hydrALAZINE (APRESOLINE) tablet 50 mg  50 mg Oral 3 times per day Thurnell Lose, MD   50 mg at 06/08/15 0629  . hydrochlorothiazide (HYDRODIURIL) tablet 25 mg  25  mg Oral Daily Kerrie Buffalo, NP   25 mg at 06/08/15 0810  . levothyroxine (SYNTHROID, LEVOTHROID) tablet 125 mcg  125 mcg Oral QAC breakfast Jenne Campus, MD   125 mcg at 06/08/15 9381  . magnesium hydroxide (MILK OF MAGNESIA) suspension 30 mL  30 mL Oral Daily PRN Dara Hoyer, PA-C      . metFORMIN (GLUCOPHAGE) tablet 500 mg  500 mg Oral BID WC Thurnell Lose, MD   500 mg at 06/08/15 1705  . metoprolol (LOPRESSOR) tablet 50 mg  50 mg Oral BID Thurnell Lose, MD   50 mg at 06/08/15 1705  . risperiDONE (RISPERDAL M-TABS) disintegrating tablet 3 mg  3 mg Oral BID Jenne Campus, MD   3 mg at 06/08/15 1706  . traZODone (DESYREL) tablet 50 mg  50 mg Oral QHS,MR X 1 Laverle Hobby, PA-C   50 mg at 06/07/15 2214    Lab Results:  Results for orders placed or performed during the hospital encounter of 06/03/15 (from the past 48 hour(s))  Glucose, capillary     Status: Abnormal   Collection Time: 06/07/15  6:39 AM  Result Value Ref Range   Glucose-Capillary 102 (H) 65 - 99 mg/dL  Glucose, capillary     Status: Abnormal   Collection Time: 06/08/15  6:07 AM  Result Value Ref Range   Glucose-Capillary 108 (H) 65 - 99 mg/dL    Physical Findings: AIMS: Facial and Oral Movements Muscles of Facial  Expression: None, normal Lips and Perioral Area: None, normal Jaw: None, normal Tongue: None, normal,Extremity Movements Upper (arms, wrists, hands, fingers): None, normal Lower (legs, knees, ankles, toes): None, normal, Trunk Movements Neck, shoulders, hips: None, normal, Overall Severity Severity of abnormal movements (highest score from questions above): None, normal Incapacitation due to abnormal movements: None, normal Patient's awareness of abnormal movements (rate only patient's report): No Awareness, Dental Status Current problems with teeth and/or dentures?: No Does patient usually wear dentures?: No  CIWA:    COWS:      Assessment - Patient remains delusional , with limited insight, but compliant with medications. She  Is denying depression, and behavior has not been agitated or threatening.  In reviewing chart, she did well on Risperidone during prior psychiatric admission, and tolerating medication well, was discharged on Risperidone 2 mgrs BID. At present is tolerating this medication well, without side effects, but delusions still present. Other dimensions of illness have improved - she is less isolative, less withdrawn, and less irritable . As discussed with team , will continue this medication at present, due to history of good tolerance and response and absence of current side effects.  BP is improved with medication adjustments, and Glucophage has been well tolerated- started by hospitalist to address abnormal HgbA1C.  Treatment Plan Summary: Daily contact with patient to assess and evaluate symptoms and progress in treatment, Medication management, Plan continue inpatient treatment and continue medications as below  Continue Klonopin 0.5 mgrs Q 8 hours PRN  anxiety, agitation Continue Risperidone to 3 mgrs BID  For persistent  Psychosis. Continue Cogentin 0.5 mgrs BID to minimize risk of antipsychotic related EPS.  Continue Levothyroxine 125 micrograms for management of  hypothyroidism. Continue Norvasc/HCTZ/hydralazine for hypertension. Now on Glucophage as per hospital service to manage abnormal HgbA1C.   Medical Decision Making:  Review of Psycho-Social Stressors (1), Review or order clinical lab tests (1), Established Problem, Worsening (2), Review of Medication Regimen & Side Effects (2) and Review of New  Medication or Change in Dosage (2)     COBOS, FERNANDO 06/08/2015, 6:19 PM

## 2015-06-08 NOTE — B[facility] Group Notes (Signed)
[facility] LCSW Aftercare Discharge Planning Group Note   06/08/2015 12:33 PM  Participation Quality:  Invited.  Declined to attend   [place]
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
## 2015-06-08 NOTE — Progress Notes (Signed)
D: Patient is alert. Pt's mood and affect is depressed and blunted. Pt denies SI/HI and AVH. Pt rates depression, hopelessness, and anxiety 0/10. Pt reports her goal for the day is "being quiet and getting some rest." Pt is tachycardic this morning (See docflowsheet-vitals). Pt reports feeling week at noon, BP WDL, pt states "I know my body and I know when I didn't get my thyroid medicine and they did not give it to me this morning," Per MAR pt received synthroid at 0629am today. Pt remains isolative in bed the majority of the day. Pt is not attending unit groups today. A: Will reassess pulse frequently. Active listening by RN. Encouragement/Support provided to pt. MD Cobos aware of pt's complaints and consulting with pt at 1200. Medication education reviewed with pt. Scheduled medications administered per providers orders (See MAR). 15 minute checks continued per protocol for patient safety.  R: Patient cooperative and receptive to nursing interventions. Pt remains safe.

## 2015-06-08 NOTE — Plan of Care (Signed)
Problem: Ineffective individual coping Goal: STG: Patient will remain free from self harm Outcome: Progressing Patient remains free from self harm. 15 minute checks continued per protocol for patient safety.   Problem: Alteration in thought process Goal: LTG-Patient verbalizes understanding importance med regimen (Patient verbalizes understanding of importance of medication regimen and need to continue outpatient care.)  Outcome: Progressing Patient is able to verbalize the importance of taking her thyroid medication today and pt expresses concerns that she needs this medication and without it does not feel well.

## 2015-06-08 NOTE — Progress Notes (Signed)
D: Patient seen sitting on bed in her room gazing at the space. Patient denies pain, SI, AH/VH at this time. Patient stated "i'm ok". Made no/new complaint. Patient requested to get her sleeping pill tonight. A: Patient encouraged to attend tonight's group. Offered support. Due medications given as ordered. Every 15 minutes check for safety maintained. Will continue to monitor patient for safety.  R: Patient cooperative and receptive to nursing interventions.

## 2015-06-09 LAB — GLUCOSE, CAPILLARY
GLUCOSE-CAPILLARY: 90 mg/dL (ref 65–99)
Glucose-Capillary: 102 mg/dL — ABNORMAL HIGH (ref 65–99)

## 2015-06-09 MED ORDER — TRAZODONE HCL 100 MG PO TABS
100.0000 mg | ORAL_TABLET | Freq: Every day | ORAL | Status: DC
  Administered 2015-06-09 – 2015-06-10 (×2): 100 mg via ORAL
  Filled 2015-06-09 (×4): qty 1

## 2015-06-09 MED ORDER — RISPERIDONE 1 MG PO TBDP
3.5000 mg | ORAL_TABLET | Freq: Every day | ORAL | Status: DC
  Administered 2015-06-09 – 2015-06-10 (×2): 3.5 mg via ORAL
  Filled 2015-06-09 (×4): qty 3.5

## 2015-06-09 MED ORDER — RISPERIDONE 1 MG PO TBDP
3.5000 mg | ORAL_TABLET | Freq: Every day | ORAL | Status: DC
  Filled 2015-06-09: qty 1

## 2015-06-09 MED ORDER — RISPERIDONE 1 MG PO TBDP
3.0000 mg | ORAL_TABLET | Freq: Every day | ORAL | Status: DC
  Administered 2015-06-10 – 2015-06-11 (×2): 3 mg via ORAL
  Filled 2015-06-09 (×4): qty 3

## 2015-06-09 NOTE — B[facility] Group Notes (Signed)
[facility] Group Notes:  (Clinical Social Work)  06/09/2015  11:15-12:00PM  Summary of Progress/Problems:   The main focus of today's process group was to discuss patients' feelings related to being hospitalized, as well as the difference between "being" and "having" a mental health diagnosis.  It was agreed in general by the group that it would be preferable to avoid future hospitalizations, and we discussed means of doing that.  As a follow-up, problems with adhering to medication recommendations were discussed.  The patient expressed their primary feeling about being hospitalized is that she does not think she needs to be in a hospital, which is all about insurance companies getting rich, hospital staff getting rich:  "it is a racket."  She went on to express her delusions about her husband and said "there is no reason for me to be here."  On the other hand, she was calm and attempted to explain things appropriately to other patients.  Type of Therapy:  Group Therapy - Process  Participation Level:  Active  Participation Quality:  Attentive, Sharing and Supportive  Affect:  Blunted  Cognitive:  Delusional  Insight:  Developing/Improving  Engagement in Therapy:  Developing/Improving  Modes of Intervention:  Exploration, Discussion  Selmer Dominion, LCSW 06/09/2015, 12:40 PM

## 2015-06-09 NOTE — Progress Notes (Signed)
D: Patient seen in her room. Patient stated "I am resting". Denies pain, SI, AH/VH at this time. Patient has been cooperative and complaint with treatment and medications. No new complaints.  A: Support and encouragement provided. Due medications given as ordered. Every 15 minutes check for safety maintained. Will continue to monitor patient. R: Patient remains appropriate.

## 2015-06-09 NOTE — Progress Notes (Signed)
Patient ID: [name], female   DOB: [birth year], 44 y.o.   MRN: [id number] [facility] MD Progress Note  06/09/2015 10:58 AM MAFALDA MCGINNISS  MRN:  [id number] Subjective: Patient reports "I am fine , I never was diagnosed with any mental illness in the past. I am going through a divorce , its my husband whom I found in bed with another man. He is homosexual.I want to go home. I will go and stay with my church members.'       Objective: I have seen pt, reviewed notes per Dr.Cobbos in EHR and discussed patient with nursing. Pt per previous notes has delusions that are fixed about her husband and other family members being homosexual. Pt continues to be fixed on those delusions. Pt otherwise today is seen as calm , cooperative . Pt denies any AH today. Pt continues to lack insight in her illness and the reason for taking her medications. Pt is tolerating her Risperdal well. Per Dr.Cobos -plan to titrate it higher over the week end. AIMS - 0 .  Pt encouraged to attend groups and take medications. Will continue to observe.     Principal Problem: Schizophrenia  Diagnosis:   Patient Active Problem List   Diagnosis Date Noted  . 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 Paranoid schizophrenia [F20.0] 07/20/2014  . 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 Contact dermatitis [L25.9] 01/18/2013  . Vaginitis [N76.0] 01/18/2013  . Essential hypertension, benign [I10] 12/24/2012  . Heartburn [R12] 12/24/2012  . Hypothyroidism [E03.9] 12/24/2012  . 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 Acute DVT (deep venous thrombosis) [I82.409] 09/27/2012  . Chronic kidney disease [N18.9] 08/28/2012  . Fibroid uterus [D25.9] 06/12/2012   Total Time spent with patient: 25 minutes    Past Medical History:  Past Medical History  Diagnosis Date  . Thyroid disease   . Fibroid tumor   . DVT (deep venous thrombosis)  09/30/12    in upper extremities b/l and LLE  . Anemia   . Thyroid nodule     s/p biopsy  . Graves disease   . Multinodular goiter 10/01/2012  . Lupus anticoagulant positive 10/02/2012  . Hypertension   . 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 Arnoldo Morale on 12/13/2012.  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 Paranoid schizophrenia   . Delusional disorder     Past Surgical History  Procedure Laterality Date  . Supracervical abdominal hysterectomy  09/27/2012    Procedure: HYSTERECTOMY SUPRACERVICAL ABDOMINAL;  Surgeon: Jonnie Kind, MD;  Location: [facility];  Service: Gynecology;  Laterality: N/A;  . Hematoma evacuation  10/03/2012    Procedure: EVACUATION HEMATOMA;  Surgeon: Jonnie Kind, MD;  Location: [facility];  Service: Gynecology;  Laterality: N/A;  . Thyroidectomy  12/13/2012    Procedure: THYROIDECTOMY;  Surgeon: Jamesetta So, MD;  Location: [facility];  Service: General;  Laterality: Bilateral;  . Abdominal hysterectomy     Family History:  Family History  Problem Relation Age of Onset  . Diabetes Mother   . Hypertension Mother   . Cancer Father     prostate   . Cancer Sister     pancreatic cancer   Social History:  History  Alcohol Use No     History  Drug Use No    History   Social History  . Marital Status: Married  Spouse Name: N/A  . Number of Children: N/A  . Years of Education: N/A   Social History Main Topics  . Smoking status: Never Smoker   . Smokeless tobacco: Never Used  . Alcohol Use: No  . Drug Use: No  . Sexual Activity: Yes    Birth Control/ Protection: Surgical   Other Topics Concern  . None   Social History Narrative   Additional History:    Sleep: improved   Appetite: good     Musculoskeletal: Strength & Muscle Tone: within normal limits Gait & Station: normal Patient leans: N/A   Psychiatric Specialty Exam: Physical Exam  Review of Systems  Psychiatric/Behavioral:  The patient is nervous/anxious (nervous about being here).   All other systems reviewed and are negative.  does not endorse akathisia, does not endorse involuntary movements. No headache, no chest pain.   Blood pressure 111/82, pulse 87, temperature 98.7 F (37.1 C), temperature source Oral, resp. rate 17, height 5\' 8"  (1.727 m), weight 108.863 kg (240 lb), last menstrual period 09/18/2012, SpO2 99 %.Body mass index is 36.5 kg/(m^2).  General Appearance: Fairly Groomed  Engineer, water::  Good  Speech:  Normal Rate  Volume:  Normal  Mood: anxious about being here  Affect:  Blunted, slightly irritable at times    Thought Process: focused on fixed delusions   Orientation:  Other:  fully alert and attentive   Thought Content:  Denies hallucinations  - not internally preoccupied, still delusional - see above   Suicidal Thoughts:  No at this time denies thoughts of hurting self or husband   Homicidal Thoughts:  No  Memory:  Recent and remote grossly intact , immediate - fair  Judgement:  Impaired  Insight:  Lacking  Psychomotor Activity:  Improved   Concentration:  Good  Recall:  Good  Fund of Knowledge:Good  Language: Good  Akathisia:  Negative  Handed:  Right  AIMS (if indicated):     Assets:  Desire for Improvement Resilience  ADL's:fair   Cognition: WNL  Sleep:  Number of Hours: 3.75     Current Medications: Current Facility-Administered Medications  Medication Dose Route Frequency Provider Last Rate Last Dose  . acetaminophen (TYLENOL) tablet 650 mg  650 mg Oral Q6H PRN Dara Hoyer, PA-C      . alum & mag hydroxide-simeth (MAALOX/MYLANTA) 200-200-20 MG/5ML suspension 30 mL  30 mL Oral Q4H PRN Dara Hoyer, PA-C      . amLODipine (NORVASC) tablet 10 mg  10 mg Oral Daily Thurnell Lose, MD   10 mg at 06/09/15 0943  . benztropine (COGENTIN) tablet 0.5 mg  0.5 mg Oral BID Jenne Campus, MD   0.5 mg at 06/09/15 0816  . clonazePAM (KLONOPIN) tablet 0.5 mg  0.5 mg Oral BID  Ambrose Finland, MD   0.5 mg at 06/09/15 0815  . cloNIDine (CATAPRES) tablet 0.1 mg  0.1 mg Oral Q6H PRN Thurnell Lose, MD      . hydrALAZINE (APRESOLINE) tablet 50 mg  50 mg Oral 3 times per day Thurnell Lose, MD   50 mg at 06/08/15 2118  . hydrochlorothiazide (HYDRODIURIL) tablet 25 mg  25 mg Oral Daily Kerrie Buffalo, NP   25 mg at 06/09/15 0944  . levothyroxine (SYNTHROID, LEVOTHROID) tablet 125 mcg  125 mcg Oral QAC breakfast Jenne Campus, MD   125 mcg at 06/09/15 0612  . magnesium hydroxide (MILK OF MAGNESIA) suspension 30 mL  30 mL Oral Daily PRN Juanda Crumble  Marva Panda, PA-C      . metFORMIN (GLUCOPHAGE) tablet 500 mg  500 mg Oral BID WC Thurnell Lose, MD   500 mg at 06/09/15 0816  . metoprolol (LOPRESSOR) tablet 50 mg  50 mg Oral BID Thurnell Lose, MD   50 mg at 06/09/15 0943  . [START ON 06/10/2015] risperiDONE (RISPERDAL M-TABS) disintegrating tablet 3 mg  3 mg Oral Q breakfast Jean Alejos, MD      . risperiDONE (RISPERDAL M-TABS) disintegrating tablet 3.5 mg  3.5 mg Oral QHS Ursula Alert, MD      . traZODone (DESYREL) tablet 100 mg  100 mg Oral QHS Ursula Alert, MD        Lab Results:  Results for orders placed or performed during the hospital encounter of 06/03/15 (from the past 48 hour(s))  Glucose, capillary     Status: Abnormal   Collection Time: 06/08/15  6:07 AM  Result Value Ref Range   Glucose-Capillary 108 (H) 65 - 99 mg/dL  Glucose, capillary     Status: Abnormal   Collection Time: 06/09/15  5:54 AM  Result Value Ref Range   Glucose-Capillary 102 (H) 65 - 99 mg/dL    Physical Findings: AIMS: Facial and Oral Movements Muscles of Facial Expression: None, normal Lips and Perioral Area: None, normal Jaw: None, normal Tongue: None, normal,Extremity Movements Upper (arms, wrists, hands, fingers): None, normal Lower (legs, knees, ankles, toes): None, normal, Trunk Movements Neck, shoulders, hips: None, normal, Overall Severity Severity of  abnormal movements (highest score from questions above): None, normal Incapacitation due to abnormal movements: None, normal Patient's awareness of abnormal movements (rate only patient's report): No Awareness, Dental Status Current problems with teeth and/or dentures?: No Does patient usually wear dentures?: No  CIWA:    COWS:      Assessment -Patient is a 89 y old AAF , with hx of schizophrenia , presented with psychosis. Pt continues to have fixed delusions , lacks insight in to her illness, tolerating her medications well, denies side effects. Will continue treatment.  Treatment Plan Summary: Daily contact with patient to assess and evaluate symptoms and progress in treatment, Medication management, Plan continue inpatient treatment and continue medications as below  Continue Klonopin 0.5 mgrs Q 8 hours PRN  anxiety, agitation Continue Risperidone, but increase to 3 mg po daily and 3.5 mg po qhs for persistent  Psychosis.AIMS - 0 May need to consider an LAI due to her lack of insight and hx of noncompliance with medications. Continue Cogentin 0.5 mgrs BID to minimize risk of antipsychotic related EPS.  Increase Trazodone to 100 mg po qhs for sleep- sleep documented as 3.5 hrs- see above Continue Levothyroxine 125 micrograms for management of hypothyroidism. Continue Norvasc/HCTZ/hydralazine for hypertension. Now on Glucophage as per hospital service to manage abnormal HgbA1C. CSW will work on disposition.   Medical Decision Making:  Review of Psycho-Social Stressors (1), Review or order clinical lab tests (1), Order AIMS Test (2), Review of Medication Regimen & Side Effects (2) and Review of New Medication or Change in Dosage (2)     Veleka Djordjevic MD 06/09/2015, 10:58 AM

## 2015-06-09 NOTE — Progress Notes (Signed)
Adult Psychoeducational Group Note  Date:  06/09/2015 Time:  8:47 PM  Group Topic/Focus:  Wrap-Up Group:   The focus of this group is to help patients review their daily goal of treatment and discuss progress on daily workbooks.  Participation Level:  Active  Participation Quality:  Appropriate  Affect:  Appropriate  Cognitive:  Appropriate  Insight: Appropriate  Engagement in Group:  Engaged  Modes of Intervention:  Discussion  Additional Comments:The patient expressed that in group they discussed feelings about being here at [facility].The patient also said that she did not have any needs.  Nash Shearer 06/09/2015, 8:47 PM

## 2015-06-09 NOTE — B[facility] Group Notes (Signed)
[facility] Group Notes:  (Nursing/MHT/Case Management/Adjunct)  Date:  06/09/2015  Time:  2:56 PM  Type of Therapy:  Nurse Education  Participation Level:Patient did not attend.    Participation Quality:  N/A  Affect:  N/A  Cognitive:  N/A  Insight:  N/A  Engagement in Group: N/A   Modes of Intervention:  N/A  Summary of Progress/Problems:  Lauralyn Primes 06/09/2015, 2:56 PM

## 2015-06-09 NOTE — Progress Notes (Signed)
D: [name] has been calm, cooperative, and compliant with medications. She denies SI/HI/AVH/pain. She accused a staff member of "saying something" to prevent her discharge today, but other than that she has been appropriate. On her self-inventory, she rated her feelings of depression, hopelessness, an anxiety a zero. A: Meds given as ordered. Q15 safety checks maintained. Support/encouragement provided.  R: She remains free from harm and proceeds with treatment. Will continue to monitor for needs/safety.

## 2015-06-10 LAB — GLUCOSE, CAPILLARY
GLUCOSE-CAPILLARY: 97 mg/dL (ref 65–99)
Glucose-Capillary: 111 mg/dL — ABNORMAL HIGH (ref 65–99)

## 2015-06-10 NOTE — Progress Notes (Signed)
At 1635 pt stood out in the hallway and stated, "I'm a prophet and what you all are doing to that man is wrong". "That man don't want to be here, he wants to go home and you all are keeping him here". "LET THAT MAN GO HOME, HE IS HURTING". "YOU ALL SHOULD BE ASHAMED OF MAKING FUN OF HIM". Writer tried to redirect pt and inform her that the patient is safe and that no one is making fun of the. Pt then stated, "YOU STOP TALKING, I'M GOING TO [place]".

## 2015-06-10 NOTE — Progress Notes (Signed)
Adult Psychoeducational Group Note  Date:  06/10/2015 Time:  9:15 PM  Group Topic/Focus:  Wrap-Up Group:   The focus of this group is to help patients review their daily goal of treatment and discuss progress on daily workbooks.  Participation Level:  Active  Participation Quality:  Appropriate  Affect:  Appropriate  Cognitive:  Appropriate  Insight: Appropriate  Engagement in Group:  Engaged  Modes of Intervention:  Education  Additional Comments: The patient expressed that she attended Music Therapy.The patient also said that music put her in a mood that she is traveling and imaginate she in the song.  Nash Shearer 06/10/2015, 9:15 PM

## 2015-06-10 NOTE — Progress Notes (Signed)
D: Pt presents with flat affect and depressed mood. Pt has minimal interaction on the unit. Pt isolates in her room. Pt denies any SI, depression and AVH. Pt do not appear to be responding to internal stimuli. Pt verbalized that she plans to return home upon discharge. Pt stated that she do not plan on living with her husband. Pt compliant with taking meds. No adverse reactions to meds verbalized by pt. A: Medications administered as ordered per MD. Verbal support given. Pt encouraged to attend groups. 15 minute checks performed for safety.  R: Pt safety maintained.

## 2015-06-10 NOTE — B[facility] Group Notes (Signed)
[facility] Group Notes:  (Clinical Social Work)  06/10/2015  [place] Group Notes:  (Clinical Social Work)  06/10/2015  11:00AM-12:00PM  Summary of Progress/Problems:  The main focus of today's process group was to listen to a variety of genres of music and to identify that different types of music provoke different responses.  The patient then was able to identify personally what was soothing for them, as well as energizing.  Handouts were used to record feelings evoked, as well as how patient can personally use this knowledge in sleep habits, with depression, and with other symptoms.  The patient expressed understanding of concepts, as well as knowledge of how each type of music affected him/her and how this can be used at home as a wellness/recovery tool.  She participated completely and demonstrated self-awareness.  Type of Therapy:  Music Therapy   Participation Level:  Active  Participation Quality:  Attentive and Sharing  Affect:  Blunted  Cognitive:  Oriented  Insight:  Engaged  Engagement in Therapy:  Engaged  Modes of Intervention:   Activity, Exploration  Selmer Dominion, LCSW 06/10/2015

## 2015-06-10 NOTE — Progress Notes (Signed)
Patient ID: [name], female   DOB: 1971/11/03, 44 y.o.   MRN: [id number] [facility] MD Progress Note  06/10/2015 2:58 PM [name]  MRN:  [id number] Subjective: Patient reports "I am fine , I have no concerns today.'    Objective: I have seen pt, reviewed notes per Dr.Cobbos in EHR and discussed patient with nursing. Pt per previous notes has delusions that are fixed about her husband and other family members being homosexual. Pt today did not focus a lot on her delusions - but has rather come to terms with it - and states that she is just going to stay away from him- and focus on herself more . Pt otherwise today is seen as calm , cooperative . Pt denies any AH today. Pt continues to lack insight in her illness and the reason for taking her medications.Pt however is tolerating her medications well. She denies any ADRs of medications. Per Dr.Cobos -plan to titrate it higher over the week end.Risperdal was increased yesterday - but she does not endorse any sx today - she is not focused on her delusons ( which may be fixed ) -hence will continue same dose today. Pt encouraged to attend groups and take medications. Will continue to observe.     Principal Problem: Schizophrenia  Diagnosis:   Patient Active Problem List   Diagnosis Date Noted  . 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 Paranoid schizophrenia [F20.0] 07/20/2014  . 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 Contact dermatitis [L25.9] 01/18/2013  . Vaginitis [N76.0] 01/18/2013  . Essential hypertension, benign [I10] 12/24/2012  . Heartburn [R12] 12/24/2012  . Hypothyroidism [E03.9] 12/24/2012  . 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 Acute DVT (deep venous thrombosis) [I82.409] 09/27/2012  . Chronic kidney disease [N18.9] 08/28/2012  . Fibroid uterus [D25.9] 06/12/2012   Total Time spent with patient: 25 minutes    Past  Medical History:  Past Medical History  Diagnosis Date  . Thyroid disease   . Fibroid tumor   . DVT (deep venous thrombosis) 09/30/12    in upper extremities b/l and LLE  . Anemia   . Thyroid nodule     s/p biopsy  . Graves disease   . Multinodular goiter 10/01/2012  . Lupus anticoagulant positive 10/02/2012  . Hypertension   . 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 Arnoldo Morale on 12/13/2012.  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 Paranoid schizophrenia   . Delusional disorder     Past Surgical History  Procedure Laterality Date  . Supracervical abdominal hysterectomy  09/27/2012    Procedure: HYSTERECTOMY SUPRACERVICAL ABDOMINAL;  Surgeon: Jonnie Kind, MD;  Location: [facility];  Service: Gynecology;  Laterality: N/A;  . Hematoma evacuation  10/03/2012    Procedure: EVACUATION HEMATOMA;  Surgeon: Jonnie Kind, MD;  Location: [facility];  Service: Gynecology;  Laterality: N/A;  . Thyroidectomy  12/13/2012    Procedure: THYROIDECTOMY;  Surgeon: Jamesetta So, MD;  Location: [facility];  Service: General;  Laterality: Bilateral;  . Abdominal hysterectomy     Family History:  Family History  Problem Relation Age of Onset  . Diabetes Mother   . Hypertension Mother   . Cancer Father     prostate   . Cancer Sister     pancreatic cancer   Social History:  History  Alcohol Use No  History  Drug Use No    History   Social History  . Marital Status: Married    Spouse Name: N/A  . Number of Children: N/A  . Years of Education: N/A   Social History Main Topics  . Smoking status: Never Smoker   . Smokeless tobacco: Never Used  . Alcohol Use: No  . Drug Use: No  . Sexual Activity: Yes    Birth Control/ Protection: Surgical   Other Topics Concern  . None   Social History Narrative   Additional History:    Sleep: improved   Appetite: good     Musculoskeletal: Strength & Muscle Tone: within normal limits Gait &  Station: normal Patient leans: N/A   Psychiatric Specialty Exam: Physical Exam  Review of Systems  Psychiatric/Behavioral: The patient is nervous/anxious (nervous about being here).   All other systems reviewed and are negative.  does not endorse akathisia, does not endorse involuntary movements. No headache, no chest pain.   Blood pressure 135/72, pulse 103, temperature 98.5 F (36.9 C), temperature source Oral, resp. rate 20, height 5\' 8"  (1.727 m), weight 108.863 kg (240 lb), last menstrual period 09/18/2012, SpO2 99 %.Body mass index is 36.5 kg/(m^2).  General Appearance: Fairly Groomed  Engineer, water::  Good  Speech:  Normal Rate  Volume:  Normal  Mood: anxious about being here  Affect:  Blunted  Thought Process: chronic fixed delusions - but not focused on it today  Orientation:  Other:  fully alert and attentive   Thought Content:  Denies hallucinations  - not internally preoccupied, still delusional - see above   Suicidal Thoughts:  No at this time denies thoughts of hurting self or husband   Homicidal Thoughts:  No  Memory:  Recent and remote grossly intact , immediate - fair  Judgement:  Impaired  Insight:  Lacking  Psychomotor Activity:  Improved   Concentration:  Good  Recall:  Good  Fund of Knowledge:Good  Language: Good  Akathisia:  Negative  Handed:  Right  AIMS (if indicated):     Assets:  Desire for Improvement Resilience  ADL's:fair   Cognition: WNL  Sleep:  Number of Hours: 3.75     Current Medications: Current Facility-Administered Medications  Medication Dose Route Frequency Provider Last Rate Last Dose  . acetaminophen (TYLENOL) tablet 650 mg  650 mg Oral Q6H PRN Dara Hoyer, PA-C      . alum & mag hydroxide-simeth (MAALOX/MYLANTA) 200-200-20 MG/5ML suspension 30 mL  30 mL Oral Q4H PRN Dara Hoyer, PA-C      . amLODipine (NORVASC) tablet 10 mg  10 mg Oral Daily Thurnell Lose, MD   10 mg at 06/10/15 0805  . benztropine (COGENTIN) tablet  0.5 mg  0.5 mg Oral BID Jenne Campus, MD   0.5 mg at 06/10/15 0806  . clonazePAM (KLONOPIN) tablet 0.5 mg  0.5 mg Oral BID Ambrose Finland, MD   0.5 mg at 06/10/15 0806  . cloNIDine (CATAPRES) tablet 0.1 mg  0.1 mg Oral Q6H PRN Thurnell Lose, MD      . hydrALAZINE (APRESOLINE) tablet 50 mg  50 mg Oral 3 times per day Thurnell Lose, MD   50 mg at 06/10/15 1431  . hydrochlorothiazide (HYDRODIURIL) tablet 25 mg  25 mg Oral Daily Kerrie Buffalo, NP   25 mg at 06/10/15 0805  . levothyroxine (SYNTHROID, LEVOTHROID) tablet 125 mcg  125 mcg Oral QAC breakfast Jenne Campus, MD   125 mcg  at 06/10/15 0622  . magnesium hydroxide (MILK OF MAGNESIA) suspension 30 mL  30 mL Oral Daily PRN Dara Hoyer, PA-C      . metFORMIN (GLUCOPHAGE) tablet 500 mg  500 mg Oral BID WC Thurnell Lose, MD   500 mg at 06/10/15 0806  . metoprolol (LOPRESSOR) tablet 50 mg  50 mg Oral BID Thurnell Lose, MD   50 mg at 06/10/15 0800  . risperiDONE (RISPERDAL M-TABS) disintegrating tablet 3 mg  3 mg Oral Q breakfast Ursula Alert, MD   3 mg at 06/10/15 0805  . risperiDONE (RISPERDAL M-TABS) disintegrating tablet 3.5 mg  3.5 mg Oral QHS Ursula Alert, MD   3.5 mg at 06/09/15 2109  . traZODone (DESYREL) tablet 100 mg  100 mg Oral QHS Ursula Alert, MD   100 mg at 06/09/15 2109    Lab Results:  Results for orders placed or performed during the hospital encounter of 06/03/15 (from the past 48 hour(s))  Glucose, capillary     Status: Abnormal   Collection Time: 06/09/15  5:54 AM  Result Value Ref Range   Glucose-Capillary 102 (H) 65 - 99 mg/dL  Glucose, capillary     Status: None   Collection Time: 06/09/15 11:38 AM  Result Value Ref Range   Glucose-Capillary 90 65 - 99 mg/dL  Glucose, capillary     Status: None   Collection Time: 06/10/15  6:09 AM  Result Value Ref Range   Glucose-Capillary 97 65 - 99 mg/dL    Physical Findings: AIMS: Facial and Oral Movements Muscles of Facial Expression:  None, normal Lips and Perioral Area: None, normal Jaw: None, normal Tongue: None, normal,Extremity Movements Upper (arms, wrists, hands, fingers): None, normal Lower (legs, knees, ankles, toes): None, normal, Trunk Movements Neck, shoulders, hips: None, normal, Overall Severity Severity of abnormal movements (highest score from questions above): None, normal Incapacitation due to abnormal movements: None, normal Patient's awareness of abnormal movements (rate only patient's report): No Awareness, Dental Status Current problems with teeth and/or dentures?: No Does patient usually wear dentures?: No  CIWA:    COWS:      Assessment -Patient is a 1 y old AAF , with hx of schizophrenia , presented with psychosis. Pt continues to have fixed delusions , but is not focused on them - rather wants to focus on self and is more concerned today about where she is going to stay once discharged. Will continue treatment.  Treatment Plan Summary: Daily contact with patient to assess and evaluate symptoms and progress in treatment, Medication management, Plan continue inpatient treatment and continue medications as below  Continue Klonopin 0.5 mgrs Q 8 hours PRN  anxiety, agitation Continue Risperidone, but increased to 3 mg po daily and 3.5 mg po qhs for persistent  Psychosis.AIMS - 0 ( 06/09/15) May need to consider an LAI due to her lack of insight and hx of noncompliance with medications. Continue Cogentin 0.5 mgrs BID to minimize risk of antipsychotic related EPS.  Increased Trazodone to 100 mg po qhs for sleep. Continue Levothyroxine 125 micrograms for management of hypothyroidism. Continue Norvasc/HCTZ/hydralazine for hypertension. Now on Glucophage as per hospital service to manage abnormal HgbA1C. CSW will work on disposition.   Medical Decision Making:  Review of Psycho-Social Stressors (1), Review or order clinical lab tests (1), Order AIMS Test (2), Review of Medication Regimen & Side Effects  (2) and Review of New Medication or Change in Dosage (2)     Diona Peregoy MD 06/10/2015, 2:58 PM

## 2015-06-11 LAB — GLUCOSE, CAPILLARY: Glucose-Capillary: 106 mg/dL — ABNORMAL HIGH (ref 65–99)

## 2015-06-11 MED ORDER — RISPERIDONE 3 MG PO TBDP: 3.0000 mg | ORAL_TABLET | Freq: Every day | ORAL | Status: DC

## 2015-06-11 MED ORDER — HYDRALAZINE HCL 50 MG PO TABS: 50.0000 mg | ORAL_TABLET | Freq: Three times a day (TID) | ORAL | Status: DC

## 2015-06-11 MED ORDER — BENZTROPINE MESYLATE 0.5 MG PO TABS: 0.5000 mg | ORAL_TABLET | Freq: Two times a day (BID) | ORAL | Status: DC

## 2015-06-11 MED ORDER — AMLODIPINE BESYLATE 10 MG PO TABS: 10.0000 mg | ORAL_TABLET | Freq: Every day | ORAL | Status: DC

## 2015-06-11 MED ORDER — HYDROCHLOROTHIAZIDE 25 MG PO TABS: 25.0000 mg | ORAL_TABLET | Freq: Every day | ORAL | Status: DC

## 2015-06-11 MED ORDER — TRAZODONE HCL 100 MG PO TABS: 100.0000 mg | ORAL_TABLET | Freq: Every day | ORAL | Status: DC

## 2015-06-11 MED ORDER — LEVOTHYROXINE SODIUM 125 MCG PO TABS: 125.0000 ug | ORAL_TABLET | Freq: Every day | ORAL | Status: AC

## 2015-06-11 MED ORDER — CLONAZEPAM 0.5 MG PO TABS: 0.5000 mg | ORAL_TABLET | Freq: Two times a day (BID) | ORAL | Status: DC

## 2015-06-11 MED ORDER — METFORMIN HCL ER 500 MG PO TB24
500.0000 mg | ORAL_TABLET | Freq: Every day | ORAL | Status: DC
  Filled 2015-06-11: qty 3

## 2015-06-11 MED ORDER — RISPERIDONE 0.5 MG PO TBDP: 3.5000 mg | ORAL_TABLET | Freq: Every day | ORAL | Status: DC

## 2015-06-11 MED ORDER — METFORMIN HCL ER 500 MG PO TB24: 500.0000 mg | ORAL_TABLET | Freq: Every day | ORAL | Status: DC

## 2015-06-11 MED ORDER — METOPROLOL TARTRATE 50 MG PO TABS: 50.0000 mg | ORAL_TABLET | Freq: Two times a day (BID) | ORAL | Status: DC

## 2015-06-11 MED ORDER — RISPERIDONE 0.5 MG PO TBDP
0.5000 mg | ORAL_TABLET | Freq: Every day | ORAL | Status: DC
  Filled 2015-06-11: qty 4

## 2015-06-11 NOTE — Tx Team (Signed)
Interdisciplinary Treatment Plan Update (Adult)  Date:  06/11/2015   Time Reviewed:  11:23 AM   Progress in Treatment: Attending groups: Yes. Participating in groups:  Yes. Taking medication as prescribed:  Yes. Tolerating medication:  Yes. Family/Significant othe contact made:  No  Pt would not give permission Patient understands diagnosis:  Yes   Discussing patient identified problems/goals with staff:  Yes, see initial care plan. Medical problems stabilized or resolved:  Yes. Denies suicidal/homicidal ideation: Yes. Issues/concerns per patient self-inventory:  No. Other:  New problem(s) identified:  Discharge Plan or Barriers:  D/C today  Reason for Continuation of Hospitalization:   Comments:  Estimated length of stay:  Return home, follow up outpt  New goal(s):  Review of initial/current patient goals per problem list:     Attendees: Patient:  06/11/2015 11:23 AM   Family:   06/11/2015 11:23 AM   Physician:  Neita Garnet, MD 06/11/2015 11:23 AM   Nursing:   Gaylan Gerold, RN 06/11/2015 11:23 AM   CSW:    Roque Lias, [place]   06/11/2015 11:23 AM   Other:  06/11/2015 11:23 AM   Other:   06/11/2015 11:23 AM   Other:  Lars Pinks, Nurse CM 06/11/2015 11:23 AM   Other:  Lucinda Dell, [facility] 06/11/2015 11:23 AM   Other:  Norberto Sorenson, [place]  06/11/2015 11:23 AM   Other:  06/11/2015 11:23 AM   Other:  06/11/2015 11:23 AM   Other:  06/11/2015 11:23 AM   Other:  06/11/2015 11:23 AM   Other:  06/11/2015 11:23 AM   Other:   06/11/2015 11:23 AM    Scribe for Treatment Team:   Trish Mage, 06/11/2015 11:23 AM
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
## 2015-06-11 NOTE — Plan of Care (Signed)
Problem: Alteration in thought process Goal: LTG-Patient has not harmed self or others in at least 2 days Outcome: Progressing Per report, pt has not made attempts or gestures to harm self X 2 days. Pt remains safe on unit without self injurious behavior to note at this time.

## 2015-06-11 NOTE — Progress Notes (Signed)
D: Pt d/c home as ordered. Pt was picked up and transported by [employer] transportation services. Calm and cooperative with care this shift.  A: All medications administered as per MD's orders. 1:1 contact made with pt to conduct shift assessment and assess needs. D/C instructions reviewed with pt.  All belongings in assigned locker given to pt prior to departure from facility. Vitals done and recorded. Safety maintained on Q 15 minutes checks as ordered till time of departure from facility.  R: Pt denied SI, HI, AVH and pain when assessed. Pt verbalized understanding of d/c instruction. Signed belonging sheet in agreement with items received from assigned locker. Ambulate with a steady gait. No physical distress noted at time of d/c.

## 2015-06-11 NOTE — Progress Notes (Signed)
  [facility] Adult Case Management Discharge Plan :  Will you be returning to the same living situation after discharge:  Yes,  home At discharge, do you have transportation home?: Yes,  [place] Do you have the ability to pay for your medications: Yes,  insurance  Release of information consent forms completed and in the chart;  Patient's signature needed at discharge.  Patient to Follow up at: Follow-up Information    Follow up with [facility] On 06/12/2015.   Why:  Tuesday, June 14, 20126 at [facility] information:   [address] [place], [place]   [address]  [phone]      Patient denies SI/HI: Yes,  yes    Safety Planning and Suicide Prevention discussed: Yes,  yes  Have you used any form of tobacco in the last 30 days? (Cigarettes, Smokeless Tobacco, Cigars, and/or Pipes): No  Has patient been referred to the Quitline?: N/A patient is not a smoker  [place] B 06/11/2015, 11:32 AM

## 2015-06-11 NOTE — [facility] Suicide Risk Assessment (Signed)
[facility] Discharge Suicide Risk Assessment   Demographic Factors:  44 year old married female.   Total Time spent with patient: 30 minutes  Musculoskeletal: Strength & Muscle Tone: within normal limits Gait & Station: normal Patient leans: N/A  Psychiatric Specialty Exam: Physical Exam  ROS  Blood pressure 125/80, pulse 114, temperature 97.5 F (36.4 C), temperature source Oral, resp. rate 20, height 5\' 8"  (1.727 m), weight 240 lb (108.863 kg), last menstrual period 09/18/2012, SpO2 99 %.Body mass index is 36.5 kg/(m^2).  General Appearance: improved grooming   Eye Contact::  Good  Speech:  Normal Rate409  Volume:  Normal  Mood:  denies depression, and mood is improved   Affect:  brighter and more reactive, smiles at times appropriately, not irritable at this time  Thought Process:  Goal Directed and Linear  Orientation:  Full (Time, Place, and Person)  Thought Content:  denies hallucinations, and does not appear internally preoccupied , much less focused /less preoccupied regarding delusional thoughts at this time.   Suicidal Thoughts:  No- denies any thoughts of wanting to harm self or anyone else, specifically also  denies any thoughts of wanting to harm husband or any family member   Homicidal Thoughts:  No  Memory:  recent and remote grossly intact   Judgement:  Fair  Insight:  Fair  Psychomotor Activity:  Normal  Concentration:  Good  Recall:  Good  Fund of Knowledge:Good  Language: Good  Akathisia:  Negative  Handed:  Right  AIMS (if indicated):     Assets:  Desire for Improvement Resilience  Sleep:  Number of Hours: 6  Cognition: WNL  ADL's:  Improved compared to admission   Have you used any form of tobacco in the last 30 days? (Cigarettes, Smokeless Tobacco, Cigars, and/or Pipes): No  Has this patient used any form of tobacco in the last 30 days? (Cigarettes, Smokeless Tobacco, Cigars, and/or Pipes) No  Mental Status Per Nursing Assessment::   On Admission:      Current Mental Status by Physician: At this time patient is improved compared to admission- she is better groomed, less irritable, and presents with a more reactive affect- smiles at times appropriately. No psychomotor agitation or restlessness , mood described as normal, denies depression, thought process linear regarding chronic delusions, she is less focused on these and states she is no longer " upset about any of that". She is denying any hallucinations and does not appear internally preoccupied. No disruptive or threatening behaviors on unit.   Loss Factors: Chronic illness, unemployed   Historical Factors: Prior psychiatric admissions - has been diagnosed with schiophrenia  Risk Reduction Factors:   Religious beliefs about death, Positive social support and Positive coping skills or problem solving skills  Continued Clinical Symptoms:  As noted, patient improved compared to admission- she is calm, pleasant, cooperative , and delusional thoughts have decreased greatly insofar as her not being as focused or preoccupied about these at this time. Behavior in good control. Tolerating medications well- no current akathisa, EPS. Of note, as discussed with staff/CSW, patient has not provided consent to speak with husband- she DENIES any thoughts of hurting him, states " I just think it is time we separate, he is happier without me  ".   Cognitive Features That Contribute To Risk:  No gross cognitive deficits noted upon discharge. Is alert , attentive, and oriented x 3   Suicide Risk:  Mild:  Suicidal ideation of limited frequency, intensity, duration, and specificity.  There  are no identifiable plans, no associated intent, mild dysphoria and related symptoms, good self-control (both objective and subjective assessment), few other risk factors, and identifiable protective factors, including available and accessible social support.  Principal Problem: Schizophrenia, paranoid,  chronic Discharge Diagnoses:  Patient Active Problem List   Diagnosis Date Noted  . 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 Paranoid schizophrenia [F20.0] 07/20/2014  . 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 Contact dermatitis [L25.9] 01/18/2013  . Vaginitis [N76.0] 01/18/2013  . Essential hypertension, benign [I10] 12/24/2012  . Heartburn [R12] 12/24/2012  . Hypothyroidism [E03.9] 12/24/2012  . 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 Acute DVT (deep venous thrombosis) [I82.409] 09/27/2012  . Chronic kidney disease [N18.9] 08/28/2012  . Fibroid uterus [D25.9] 06/12/2012    Follow-up Information    Follow up with [facility] On 06/12/2015.   Why:  Tuesday, June 14, 20126 at [facility] information:   [address] [place], [place]   [address]  [phone]      Plan Of Care/Follow-up recommendations:  Activity:  as tolerated  Diet:  heart healthy Tests:  NA Other:  See below   Is patient on multiple antipsychotic therapies at discharge:  No   Has Patient had three or more failed trials of antipsychotic monotherapy by history:  No  Recommended Plan for Multiple Antipsychotic Therapies: NA  At this time there are no grounds for involuntary commitment and patient wants to discharge. Plans to return home.  States she plans to follow up with [facility] as above, and also has an established PCP , Dr Salome Arnt, in [place], for medical management and follow up as needed    COBOS, [facility] 06/11/2015, 10:44 AM

## 2015-06-11 NOTE — Progress Notes (Signed)
D: Patient cheerful and pleasant this evening, cooperative and compliant with medications. Patient denies pain, SI, AH/VH at this time. Patient rated her feelings of depression and anxiety 0/10. Made no new complaints. A: Encouragement/support provided. Due medications given as ordered. Every 15 minutes check for safety maintained. Will continue to monitor patient for safety and stability. R: Patient remains appropriate.

## 2015-06-21 ENCOUNTER — Emergency Department (HOSPITAL_COMMUNITY)
Admission: EM | Admit: 2015-06-21 | Discharge: 2015-06-23 | Disposition: A | Discharge status: Other Acute Inpatient Hospital | Payer: BLUE CROSS/BLUE SHIELD | Attending: Emergency Medicine | Admitting: Emergency Medicine

## 2015-06-21 DIAGNOSIS — E079 Disorder of thyroid, unspecified: Secondary | ICD-10-CM | POA: Insufficient documentation

## 2015-06-21 DIAGNOSIS — Z046 Encounter for general psychiatric examination, requested by authority: Secondary | ICD-10-CM | POA: Diagnosis present

## 2015-06-21 DIAGNOSIS — Z86018 Personal history of other benign neoplasm: Secondary | ICD-10-CM | POA: Diagnosis not present

## 2015-06-21 DIAGNOSIS — I10 Essential (primary) hypertension: Secondary | ICD-10-CM | POA: Diagnosis not present

## 2015-06-21 DIAGNOSIS — Z86718 Personal history of other venous thrombosis and embolism: Secondary | ICD-10-CM | POA: Diagnosis not present

## 2015-06-21 DIAGNOSIS — Z79899 Other long term (current) drug therapy: Secondary | ICD-10-CM | POA: Insufficient documentation

## 2015-06-21 DIAGNOSIS — F200 Paranoid schizophrenia: Secondary | ICD-10-CM | POA: Insufficient documentation

## 2015-06-21 LAB — RAPID URINE DRUG SCREEN, HOSP PERFORMED
Amphetamines: NOT DETECTED
Barbiturates: NOT DETECTED
Benzodiazepines: NOT DETECTED
COCAINE: NOT DETECTED
OPIATES: NOT DETECTED
Tetrahydrocannabinol: NOT DETECTED

## 2015-06-21 LAB — CBC WITH DIFFERENTIAL/PLATELET
Basophils Absolute: 0 10*3/uL (ref 0.0–0.1)
Basophils Relative: 0 % (ref 0–1)
EOS ABS: 0.1 10*3/uL (ref 0.0–0.7)
Eosinophils Relative: 2 % (ref 0–5)
HEMATOCRIT: 37.5 % (ref 36.0–46.0)
Hemoglobin: 12.3 g/dL (ref 12.0–15.0)
LYMPHS ABS: 1.6 10*3/uL (ref 0.7–4.0)
Lymphocytes Relative: 25 % (ref 12–46)
MCH: 27.8 pg (ref 26.0–34.0)
MCHC: 32.8 g/dL (ref 30.0–36.0)
MCV: 84.8 fL (ref 78.0–100.0)
MONO ABS: 0.5 10*3/uL (ref 0.1–1.0)
MONOS PCT: 7 % (ref 3–12)
NEUTROS ABS: 4.1 10*3/uL (ref 1.7–7.7)
NEUTROS PCT: 66 % (ref 43–77)
Platelets: 217 10*3/uL (ref 150–400)
RBC: 4.42 MIL/uL (ref 3.87–5.11)
RDW: 15.2 % (ref 11.5–15.5)
WBC: 6.3 10*3/uL (ref 4.0–10.5)

## 2015-06-21 LAB — BASIC METABOLIC PANEL
Anion gap: 7 (ref 5–15)
BUN: 20 mg/dL (ref 6–20)
CALCIUM: 9.1 mg/dL (ref 8.9–10.3)
CO2: 27 mmol/L (ref 22–32)
Chloride: 103 mmol/L (ref 101–111)
Creatinine, Ser: 1.21 mg/dL — ABNORMAL HIGH (ref 0.44–1.00)
GFR calc Af Amer: >60 mL/min (ref >60)
GFR, EST NON AFRICAN AMERICAN: 53 mL/min — AB (ref >60)
Glucose, Bld: 136 mg/dL — ABNORMAL HIGH (ref 65–99)
Potassium: 3.2 mmol/L — ABNORMAL LOW (ref 3.5–5.1)
Sodium: 137 mmol/L (ref 135–145)

## 2015-06-21 LAB — ETHANOL: ALCOHOL ETHYL (B): 5 mg/dL — AB (ref <5)

## 2015-06-21 MED ORDER — HALOPERIDOL LACTATE 5 MG/ML IJ SOLN
10.0000 mg | Freq: Once | INTRAMUSCULAR | Status: AC
  Administered 2015-06-21: 10 mg via INTRAMUSCULAR
  Filled 2015-06-21: qty 2

## 2015-06-21 NOTE — ED Provider Notes (Signed)
CSN: [id number]     Arrival date & time 06/21/15  2015 History  This chart was scribed for Debra Christen, MD by Rayfield Citizen, ED Scribe. This patient was seen in room APA16A/APA16A and the patient's care was started at 8:43 PM.    Level 5 Caveat; Psychosis   Chief Complaint  Patient presents with  . V70.1   The history is provided by the police and the patient. The history is limited by the condition of the patient. No language interpreter was used.     HPI Comments: [name] is a 44 y.o. female who presents to the Emergency Department for IVC. Per nurse's [employer], patient is not taking her medications appropriately and has become very angry and violent. During exam, patient relates a story of God speaking to her; "God told me not to come to the ED, he said they'd charge me. I don't have any insurance, y'all can't charge me. God told me you had already looked up all my medications and they are at [facility]. God told me I have nothing to say to you. I have nothing to say."  Past Medical History  Diagnosis Date  . Thyroid disease   . Fibroid tumor   . DVT (deep venous thrombosis) 09/30/12    in upper extremities b/l and LLE  . Anemia   . Thyroid nodule     s/p biopsy  . Graves disease   . Multinodular goiter 10/01/2012  . Lupus anticoagulant positive 10/02/2012  . Hypertension   . 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 Arnoldo Morale on 12/13/2012.  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 Paranoid schizophrenia   . Delusional disorder    Past Surgical History  Procedure Laterality Date  . Supracervical abdominal hysterectomy  09/27/2012    Procedure: HYSTERECTOMY SUPRACERVICAL ABDOMINAL;  Surgeon: Jonnie Kind, MD;  Location: [facility];  Service: Gynecology;  Laterality: N/A;  . Hematoma evacuation  10/03/2012    Procedure: EVACUATION HEMATOMA;  Surgeon: Jonnie Kind, MD;  Location: [facility];  Service: Gynecology;  Laterality: N/A;  .  Thyroidectomy  12/13/2012    Procedure: THYROIDECTOMY;  Surgeon: Jamesetta So, MD;  Location: [facility];  Service: General;  Laterality: Bilateral;  . Abdominal hysterectomy     Family History  Problem Relation Age of Onset  . Diabetes Mother   . Hypertension Mother   . Cancer Father     prostate   . Cancer Sister     pancreatic cancer   History  Substance Use Topics  . Smoking status: Never Smoker   . Smokeless tobacco: Never Used  . Alcohol Use: No   OB History    No data available     Review of Systems  Unable to perform ROS: Psychiatric disorder   A complete 10 system review of systems was obtained and all systems are negative except as noted in the HPI and PMH.    Allergies  Review of patient's allergies indicates no known allergies.  Home Medications   Prior to Admission medications   Medication Sig Start Date End Date Taking? Authorizing Provider  amLODipine (NORVASC) 10 MG tablet Take 1 tablet (10 mg total) by mouth daily. 06/11/15   Niel Hummer, NP  benztropine (COGENTIN) 0.5 MG tablet Take 1 tablet (0.5 mg total) by mouth 2 (two) times daily. 06/11/15   Niel Hummer, NP  clonazePAM (KLONOPIN) 0.5 MG tablet Take 1  tablet (0.5 mg total) by mouth 2 (two) times daily. 06/11/15   Niel Hummer, NP  hydrALAZINE (APRESOLINE) 50 MG tablet Take 1 tablet (50 mg total) by mouth every 8 (eight) hours. 06/11/15   Niel Hummer, NP  hydrochlorothiazide (HYDRODIURIL) 25 MG tablet Take 1 tablet (25 mg total) by mouth daily. 06/11/15   Niel Hummer, NP  levothyroxine (SYNTHROID) 125 MCG tablet Take 1 tablet (125 mcg total) by mouth daily. 06/11/15   Niel Hummer, NP  metFORMIN (GLUCOPHAGE-XR) 500 MG 24 hr tablet Take 1 tablet (500 mg total) by mouth daily with breakfast. 06/11/15   Niel Hummer, NP  metoprolol (LOPRESSOR) 50 MG tablet Take 1 tablet (50 mg total) by mouth 2 (two) times daily. 06/11/15   Niel Hummer, NP  risperiDONE (RISPERDAL M-TABS) 0.5 MG disintegrating tablet  Take 7 tablets (3.5 mg total) by mouth at bedtime. 06/11/15   Niel Hummer, NP  risperiDONE (RISPERDAL M-TABS) 3 MG disintegrating tablet Take 1 tablet (3 mg total) by mouth daily with breakfast. 06/11/15   Niel Hummer, NP  traZODone (DESYREL) 100 MG tablet Take 1 tablet (100 mg total) by mouth at bedtime. 06/11/15   Niel Hummer, NP   BP 167/124 mmHg  Pulse 120  Temp(Src) 98.6 F (37 C) (Oral)  Resp 22  Ht 5\' 9"  (1.753 m)  Wt 220 lb (99.791 kg)  BMI 32.47 kg/m2  SpO2 99%  LMP 09/18/2012 Physical Exam  Constitutional: She is oriented to person, place, and time. She appears well-developed and well-nourished.  HENT:  Head: Normocephalic and atraumatic.  Eyes: Conjunctivae and EOM are normal. Pupils are equal, round, and reactive to light.  Neck: Normal range of motion. Neck supple.  Cardiovascular: Normal rate and regular rhythm.   Pulmonary/Chest: Effort normal and breath sounds normal.  Abdominal: Soft. Bowel sounds are normal.  Musculoskeletal: Normal range of motion.  Neurological: She is alert and oriented to person, place, and time.  Skin: Skin is warm and dry.  Psychiatric:  Flight of ideas, tangential thinking, paranoia  Nursing note and vitals reviewed.   ED Course  Procedures   DIAGNOSTIC STUDIES: Oxygen Saturation is 99% on RA, normal by my interpretation.    COORDINATION OF CARE: 8:45 PM Discussed treatment plan with pt at bedside.    Labs Review Labs Reviewed  BASIC METABOLIC PANEL - Abnormal; Notable for the following:    Potassium 3.2 (*)    Glucose, Bld 136 (*)    Creatinine, Ser 1.21 (*)    GFR calc non Af Amer 53 (*)    All other components within normal limits  ETHANOL - Abnormal; Notable for the following:    Alcohol, Ethyl (B) 5 (*)    All other components within normal limits  CBC WITH DIFFERENTIAL/PLATELET  URINE RAPID DRUG SCREEN, HOSP PERFORMED    Imaging Review No results found.   EKG Interpretation None      MDM   Final  diagnoses:  Paranoid schizophrenia   Patient is psychotic. Haldol 10 mg IM given. Behavioral health consult pending   I personally performed the services described in this documentation, which was scribed in my presence. The recorded information has been reviewed and is accurate.       Debra Christen, MD 06/21/15 [phone]

## 2015-06-21 NOTE — ED Notes (Addendum)
Pt brought to department by [employer] under IVC.  Per papers, pt is not taking medications appropriately and becoming very angry and violent. Pt shouting and non-cooperative during triage.

## 2015-06-21 NOTE — ED Notes (Signed)
Patient is back and forth to bathroom, pacing floor in room, washing hands, we asked to return to bed she is verbally abusive to staff and police officer at bedside, telling everyone if they stay anything to her "God is going to send you to hell".  Patient is currently restrained to bed by one handcuff.

## 2015-06-21 NOTE — ED Notes (Signed)
belongings secured in locker. Purse contins 2 [employer] [facility] IDs and social security card in black wallet, No other items of value.

## 2015-06-22 MED ORDER — ACETAMINOPHEN 325 MG PO TABS: 650.0000 mg | ORAL_TABLET | ORAL | Status: DC | PRN

## 2015-06-22 MED ORDER — LORAZEPAM 1 MG PO TABS: 1.0000 mg | ORAL_TABLET | Freq: Three times a day (TID) | ORAL | Status: DC | PRN

## 2015-06-22 MED ORDER — ALUM & MAG HYDROXIDE-SIMETH 200-200-20 MG/5ML PO SUSP: 30.0000 mL | ORAL | Status: DC | PRN

## 2015-06-22 MED ORDER — ONDANSETRON HCL 4 MG PO TABS: 4.0000 mg | ORAL_TABLET | Freq: Three times a day (TID) | ORAL | Status: DC | PRN

## 2015-06-22 MED ORDER — IBUPROFEN 400 MG PO TABS: 600.0000 mg | ORAL_TABLET | Freq: Three times a day (TID) | ORAL | Status: DC | PRN

## 2015-06-22 MED ORDER — ZOLPIDEM TARTRATE 5 MG PO TABS: 5.0000 mg | ORAL_TABLET | Freq: Every evening | ORAL | Status: DC | PRN

## 2015-06-22 MED ORDER — NICOTINE 21 MG/24HR TD PT24
21.0000 mg | MEDICATED_PATCH | Freq: Every day | TRANSDERMAL | Status: DC
  Filled 2015-06-22: qty 1

## 2015-06-22 NOTE — ED Notes (Signed)
[employer] signed off, hospital sitter with pt. Patient resting at this time, did not eat breakfast.

## 2015-06-22 NOTE — ED Provider Notes (Signed)
Patient sleeping this morning. No significant problems overnight per nursing staff. Continue psychiatric evaluation.    Orpah Greek, MD 06/22/15 [phone]

## 2015-06-22 NOTE — ED Notes (Signed)
Pt agrees to remain calm and cooperative. Sitter to come in to sit with patient and [employer] will be released at that time. Pt. Advised that we will call [employer] back and she will be hand cuffed to bed if she becomes aggressive again. Pt agreeable.

## 2015-06-22 NOTE — ED Notes (Signed)
Patient up to bR.

## 2015-06-22 NOTE — Progress Notes (Signed)
Pt referral faxed to the following facilities who report they are accepting referrals or have bed availability:  [facility]  Will continue seeking placement.  Peri Maris, [place] 06/22/2015 1:49 PM

## 2015-06-22 NOTE — ED Notes (Signed)
Patient asking if she is going to be allowed to go home.  Informed her that she would not and that her information has been sent to several facilities for placement.  She was calm and understood.
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
## 2015-06-23 LAB — CBG MONITORING, ED: GLUCOSE-CAPILLARY: 107 mg/dL — AB (ref 65–99)

## 2015-06-23 MED ORDER — POTASSIUM CHLORIDE CRYS ER 20 MEQ PO TBCR
EXTENDED_RELEASE_TABLET | ORAL | Status: DC
Start: 2015-06-23 — End: 2015-06-23
  Filled 2015-06-23: qty 2

## 2015-06-23 MED ORDER — POTASSIUM CHLORIDE CRYS ER 20 MEQ PO TBCR
40.0000 meq | EXTENDED_RELEASE_TABLET | Freq: Once | ORAL | Status: AC
  Administered 2015-06-23: 40 meq via ORAL
  Filled 2015-06-23: qty 2

## 2015-06-23 MED ORDER — RISPERIDONE 1 MG PO TBDP
3.0000 mg | ORAL_TABLET | Freq: Every day | ORAL | Status: DC
  Filled 2015-06-23 (×2): qty 3

## 2015-06-23 MED ORDER — RISPERIDONE 1 MG PO TBDP
3.5000 mg | ORAL_TABLET | Freq: Every day | ORAL | Status: DC
  Filled 2015-06-23: qty 3.5

## 2015-06-23 MED ORDER — TRAZODONE HCL 50 MG PO TABS: 100.0000 mg | ORAL_TABLET | Freq: Every day | ORAL | Status: DC

## 2015-06-23 MED ORDER — RISPERIDONE 2 MG PO TBDP
3.0000 mg | ORAL_TABLET | Freq: Every day | ORAL | Status: DC
  Filled 2015-06-23 (×2): qty 1

## 2015-06-23 MED ORDER — AMLODIPINE BESYLATE 5 MG PO TABS: 10.0000 mg | ORAL_TABLET | Freq: Every day | ORAL | Status: DC

## 2015-06-23 MED ORDER — METFORMIN HCL ER 500 MG PO TB24
500.0000 mg | ORAL_TABLET | Freq: Every day | ORAL | Status: DC
  Filled 2015-06-23 (×2): qty 1

## 2015-06-23 MED ORDER — CLONAZEPAM 0.5 MG PO TABS: 0.5000 mg | ORAL_TABLET | Freq: Two times a day (BID) | ORAL | Status: DC

## 2015-06-23 MED ORDER — RISPERIDONE 2 MG PO TBDP
3.5000 mg | ORAL_TABLET | Freq: Every day | ORAL | Status: DC
  Filled 2015-06-23: qty 1

## 2015-06-23 MED ORDER — HYDRALAZINE HCL 25 MG PO TABS
50.0000 mg | ORAL_TABLET | Freq: Three times a day (TID) | ORAL | Status: DC
  Administered 2015-06-23: 50 mg via ORAL
  Filled 2015-06-23 (×5): qty 1

## 2015-06-23 MED ORDER — HYDRALAZINE HCL 25 MG PO TABS
ORAL_TABLET | ORAL | Status: AC
  Filled 2015-06-23: qty 2

## 2015-06-23 MED ORDER — HYDROCHLOROTHIAZIDE 25 MG PO TABS
25.0000 mg | ORAL_TABLET | Freq: Every day | ORAL | Status: DC
  Filled 2015-06-23 (×2): qty 1

## 2015-06-23 MED ORDER — LEVOTHYROXINE SODIUM 50 MCG PO TABS: 125.0000 ug | ORAL_TABLET | Freq: Every day | ORAL | Status: DC

## 2015-06-23 MED ORDER — METOPROLOL TARTRATE 50 MG PO TABS: 50.0000 mg | ORAL_TABLET | Freq: Two times a day (BID) | ORAL | Status: DC

## 2015-06-23 MED ORDER — BENZTROPINE MESYLATE 1 MG PO TABS: 0.5000 mg | ORAL_TABLET | Freq: Two times a day (BID) | ORAL | Status: DC

## 2015-06-23 NOTE — ED Provider Notes (Signed)
Patient has been accepted at [facility] by Dr. Alvy Bimler. On reviewing his records, it is noted that he was hypokalemic and is given a dose of potassium.   Debra Fuel, MD 61/22/44 [id number]

## 2015-06-23 NOTE — [facility] Counselor (Signed)
Received call from [facility] that Pt has been accepted to their facility by Dr. Alvy Bimler. Nursing report should be called to [phone]. Notified Dr. Roxanne Mins of acceptance. Pt is IVC and will be transported by sheriff's dept.  Orpah Greek Anson Fret, [place], [facility], [facility] Triage Specialist [phone]

## 2015-07-15 ENCOUNTER — Emergency Department (HOSPITAL_COMMUNITY)
Admission: EM | Admit: 2015-07-15 | Discharge: 2015-07-16 | Disposition: A | Discharge status: Other Acute Inpatient Hospital | Payer: BLUE CROSS/BLUE SHIELD | Attending: Emergency Medicine | Admitting: Emergency Medicine

## 2015-07-15 ENCOUNTER — Encounter (HOSPITAL_COMMUNITY): Payer: Self-pay | Admitting: Emergency Medicine

## 2015-07-15 DIAGNOSIS — F419 Anxiety disorder, unspecified: Secondary | ICD-10-CM | POA: Insufficient documentation

## 2015-07-15 DIAGNOSIS — Z046 Encounter for general psychiatric examination, requested by authority: Secondary | ICD-10-CM | POA: Diagnosis present

## 2015-07-15 DIAGNOSIS — Z79899 Other long term (current) drug therapy: Secondary | ICD-10-CM | POA: Diagnosis not present

## 2015-07-15 DIAGNOSIS — Z86718 Personal history of other venous thrombosis and embolism: Secondary | ICD-10-CM | POA: Diagnosis not present

## 2015-07-15 DIAGNOSIS — E079 Disorder of thyroid, unspecified: Secondary | ICD-10-CM | POA: Diagnosis not present

## 2015-07-15 DIAGNOSIS — F911 Conduct disorder, childhood-onset type: Secondary | ICD-10-CM | POA: Diagnosis not present

## 2015-07-15 DIAGNOSIS — Z862 Personal history of diseases of the blood and blood-forming organs and certain disorders involving the immune mechanism: Secondary | ICD-10-CM | POA: Insufficient documentation

## 2015-07-15 DIAGNOSIS — F209 Schizophrenia, unspecified: Secondary | ICD-10-CM | POA: Diagnosis not present

## 2015-07-15 DIAGNOSIS — F1310 Sedative, hypnotic or anxiolytic abuse, uncomplicated: Secondary | ICD-10-CM | POA: Insufficient documentation

## 2015-07-15 DIAGNOSIS — R4689 Other symptoms and signs involving appearance and behavior: Secondary | ICD-10-CM

## 2015-07-15 DIAGNOSIS — I10 Essential (primary) hypertension: Secondary | ICD-10-CM | POA: Diagnosis not present

## 2015-07-15 DIAGNOSIS — Z86018 Personal history of other benign neoplasm: Secondary | ICD-10-CM | POA: Insufficient documentation

## 2015-07-15 LAB — RAPID URINE DRUG SCREEN, HOSP PERFORMED
Amphetamines: NOT DETECTED
Barbiturates: NOT DETECTED
Benzodiazepines: POSITIVE — AB
COCAINE: NOT DETECTED
Opiates: NOT DETECTED
Tetrahydrocannabinol: NOT DETECTED

## 2015-07-15 LAB — COMPREHENSIVE METABOLIC PANEL
ALK PHOS: 77 U/L (ref 38–126)
ALT: 26 U/L (ref 14–54)
ANION GAP: 8 (ref 5–15)
AST: 19 U/L (ref 15–41)
Albumin: 4.2 g/dL (ref 3.5–5.0)
BUN: 21 mg/dL — AB (ref 6–20)
CALCIUM: 9.1 mg/dL (ref 8.9–10.3)
CO2: 25 mmol/L (ref 22–32)
Chloride: 103 mmol/L (ref 101–111)
Creatinine, Ser: 1.17 mg/dL — ABNORMAL HIGH (ref 0.44–1.00)
GFR calc non Af Amer: 55 mL/min — ABNORMAL LOW (ref >60)
Glucose, Bld: 125 mg/dL — ABNORMAL HIGH (ref 65–99)
POTASSIUM: 3.5 mmol/L (ref 3.5–5.1)
Sodium: 136 mmol/L (ref 135–145)
TOTAL PROTEIN: 8.2 g/dL — AB (ref 6.5–8.1)
Total Bilirubin: 0.5 mg/dL (ref 0.3–1.2)

## 2015-07-15 LAB — CBC WITH DIFFERENTIAL/PLATELET
Basophils Absolute: 0 10*3/uL (ref 0.0–0.1)
Basophils Relative: 0 % (ref 0–1)
EOS ABS: 0 10*3/uL (ref 0.0–0.7)
EOS PCT: 0 % (ref 0–5)
HEMATOCRIT: 41.6 % (ref 36.0–46.0)
HEMOGLOBIN: 14.1 g/dL (ref 12.0–15.0)
LYMPHS ABS: 0.9 10*3/uL (ref 0.7–4.0)
LYMPHS PCT: 13 % (ref 12–46)
MCH: 28.1 pg (ref 26.0–34.0)
MCHC: 33.9 g/dL (ref 30.0–36.0)
MCV: 83 fL (ref 78.0–100.0)
Monocytes Absolute: 0.3 10*3/uL (ref 0.1–1.0)
Monocytes Relative: 4 % (ref 3–12)
Neutro Abs: 5.6 10*3/uL (ref 1.7–7.7)
Neutrophils Relative %: 83 % — ABNORMAL HIGH (ref 43–77)
Platelets: 194 10*3/uL (ref 150–400)
RBC: 5.01 MIL/uL (ref 3.87–5.11)
RDW: 15.1 % (ref 11.5–15.5)
WBC: 6.8 10*3/uL (ref 4.0–10.5)

## 2015-07-15 LAB — ETHANOL

## 2015-07-15 MED ORDER — ZIPRASIDONE MESYLATE 20 MG IM SOLR: 10.0000 mg | Freq: Once | INTRAMUSCULAR | Status: DC

## 2015-07-15 MED ORDER — DIPHENHYDRAMINE HCL 50 MG/ML IJ SOLN
25.0000 mg | Freq: Once | INTRAMUSCULAR | Status: AC
  Administered 2015-07-15: 25 mg via INTRAMUSCULAR
  Filled 2015-07-15: qty 1

## 2015-07-15 MED ORDER — ZIPRASIDONE MESYLATE 20 MG IM SOLR
10.0000 mg | Freq: Once | INTRAMUSCULAR | Status: DC
  Filled 2015-07-15: qty 20

## 2015-07-15 MED ORDER — RISPERIDONE 1 MG PO TBDP
3.0000 mg | ORAL_TABLET | Freq: Every day | ORAL | Status: DC
  Filled 2015-07-15 (×2): qty 3

## 2015-07-15 MED ORDER — LEVOTHYROXINE SODIUM 50 MCG PO TABS
125.0000 ug | ORAL_TABLET | Freq: Every day | ORAL | Status: DC
  Administered 2015-07-15: 125 ug via ORAL
  Filled 2015-07-15 (×2): qty 3

## 2015-07-15 MED ORDER — HALOPERIDOL LACTATE 5 MG/ML IJ SOLN
5.0000 mg | Freq: Once | INTRAMUSCULAR | Status: AC
  Administered 2015-07-15: 5 mg via INTRAMUSCULAR
  Filled 2015-07-15: qty 1

## 2015-07-15 MED ORDER — AMLODIPINE BESYLATE 5 MG PO TABS
10.0000 mg | ORAL_TABLET | Freq: Every day | ORAL | Status: DC
  Administered 2015-07-15: 10 mg via ORAL
  Filled 2015-07-15 (×2): qty 2

## 2015-07-15 MED ORDER — LORAZEPAM 2 MG/ML IJ SOLN
1.0000 mg | Freq: Once | INTRAMUSCULAR | Status: AC
  Administered 2015-07-15: 1 mg via INTRAMUSCULAR
  Filled 2015-07-15: qty 1

## 2015-07-15 MED ORDER — BENZTROPINE MESYLATE 1 MG PO TABS
0.5000 mg | ORAL_TABLET | Freq: Two times a day (BID) | ORAL | Status: DC
  Filled 2015-07-15: qty 1

## 2015-07-15 MED ORDER — CLONAZEPAM 0.5 MG PO TABS
0.5000 mg | ORAL_TABLET | Freq: Two times a day (BID) | ORAL | Status: DC
  Filled 2015-07-15 (×2): qty 1

## 2015-07-15 MED ORDER — HALOPERIDOL 5 MG PO TABS: 5.0000 mg | ORAL_TABLET | Freq: Once | ORAL | Status: DC

## 2015-07-15 MED ORDER — ACETAMINOPHEN 325 MG PO TABS: 650.0000 mg | ORAL_TABLET | ORAL | Status: DC | PRN

## 2015-07-15 MED ORDER — TRAZODONE HCL 50 MG PO TABS: 100.0000 mg | ORAL_TABLET | Freq: Every day | ORAL | Status: DC

## 2015-07-15 MED ORDER — HYDRALAZINE HCL 25 MG PO TABS
50.0000 mg | ORAL_TABLET | Freq: Three times a day (TID) | ORAL | Status: DC
  Administered 2015-07-15 – 2015-07-16 (×3): 50 mg via ORAL
  Filled 2015-07-15 (×7): qty 2

## 2015-07-15 NOTE — ED Provider Notes (Signed)
CSN: [id number]     Arrival date & time 07/15/15  1006 History  This chart was scribed for Ripley Fraise, MD by Meriel Pica, ED Scribe. This patient was seen in room APA15/APA15 and the patient's care was started 10:28 AM.   Chief Complaint  Patient presents with  . V70.1   LEVEL 5 CAVEAT: PT'S PSYCH CONDITION  The history is provided by the patient. No language interpreter was used.   LEVEL 5 CAVEAT: PT'S PSYCH CONDITION  HPI Comments: [name] is a 44 y.o. female who presents to the Emergency Department IVC complaining of sudden onset auditory hallucinations. Pt is stating in a raised voice that her husband is homosexual and that God will kill her husband and others who try to touch her. Per officers patient believes her husband is homosexual and has AIDS. Officer also reports pt was talking to herself. Pt has been evaluated IVC several times prior to this visit in the ED for similar psychotic episodes.  Past Medical History  Diagnosis Date  . Thyroid disease   . Fibroid tumor   . DVT (deep venous thrombosis) 09/30/12    in upper extremities b/l and LLE  . Anemia   . Thyroid nodule     s/p biopsy  . Graves disease   . Multinodular goiter 10/01/2012  . Lupus anticoagulant positive 10/02/2012  . Hypertension   . 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 Arnoldo Morale on 12/13/2012.  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 Paranoid schizophrenia   . Delusional disorder    Past Surgical History  Procedure Laterality Date  . Supracervical abdominal hysterectomy  09/27/2012    Procedure: HYSTERECTOMY SUPRACERVICAL ABDOMINAL;  Surgeon: Jonnie Kind, MD;  Location: [facility];  Service: Gynecology;  Laterality: N/A;  . Hematoma evacuation  10/03/2012    Procedure: EVACUATION HEMATOMA;  Surgeon: Jonnie Kind, MD;  Location: [facility];  Service: Gynecology;  Laterality: N/A;  . Thyroidectomy  12/13/2012    Procedure: THYROIDECTOMY;   Surgeon: Jamesetta So, MD;  Location: [facility];  Service: General;  Laterality: Bilateral;  . Abdominal hysterectomy     Family History  Problem Relation Age of Onset  . Diabetes Mother   . Hypertension Mother   . Cancer Father     prostate   . Cancer Sister     pancreatic cancer   History  Substance Use Topics  . Smoking status: Never Smoker   . Smokeless tobacco: Never Used  . Alcohol Use: No   OB History    No data available     Review of Systems  Unable to perform ROS: Mental status change  Psychiatric/Behavioral: Positive for hallucinations and agitation.   Allergies  Review of patient's allergies indicates no known allergies.  Home Medications   Prior to Admission medications   Medication Sig Start Date End Date Taking? Authorizing Provider  amLODipine (NORVASC) 10 MG tablet Take 1 tablet (10 mg total) by mouth daily. 06/11/15   Niel Hummer, NP  benztropine (COGENTIN) 0.5 MG tablet Take 1 tablet (0.5 mg total) by mouth 2 (two) times daily. 06/11/15   Niel Hummer, NP  clonazePAM (KLONOPIN) 0.5 MG tablet Take 1 tablet (0.5 mg total) by mouth 2 (two) times daily. 06/11/15   Niel Hummer, NP  hydrALAZINE (APRESOLINE) 50 MG tablet Take 1 tablet (50 mg total) by mouth every 8 (eight) hours. 06/11/15   Hewitt Shorts  Rosana Hoes, NP  hydrochlorothiazide (HYDRODIURIL) 25 MG tablet Take 1 tablet (25 mg total) by mouth daily. 06/11/15   Niel Hummer, NP  levothyroxine (SYNTHROID) 125 MCG tablet Take 1 tablet (125 mcg total) by mouth daily. 06/11/15   Niel Hummer, NP  metFORMIN (GLUCOPHAGE-XR) 500 MG 24 hr tablet Take 1 tablet (500 mg total) by mouth daily with breakfast. 06/11/15   Niel Hummer, NP  metoprolol (LOPRESSOR) 50 MG tablet Take 1 tablet (50 mg total) by mouth 2 (two) times daily. 06/11/15   Niel Hummer, NP  risperiDONE (RISPERDAL M-TABS) 0.5 MG disintegrating tablet Take 7 tablets (3.5 mg total) by mouth at bedtime. 06/11/15   Niel Hummer, NP  risperiDONE (RISPERDAL M-TABS) 3  MG disintegrating tablet Take 1 tablet (3 mg total) by mouth daily with breakfast. 06/11/15   Niel Hummer, NP  traZODone (DESYREL) 100 MG tablet Take 1 tablet (100 mg total) by mouth at bedtime. 06/11/15   Niel Hummer, NP   BP 184/112 mmHg  Pulse 112  Temp(Src) 98.8 F (37.1 C) (Oral)  Resp 18  Ht 5\' 9"  (1.753 m)  Wt 220 lb (99.791 kg)  BMI 32.47 kg/m2  SpO2 100%  LMP 09/18/2012 Physical Exam  CONSTITUTIONAL: anxious and disheveled  HEAD: Normocephalic EYES: EOMI ENMT: Mucous membranes moist NECK: supple no meningeal signs LUNGS: no apparent distress ABDOMEN: soft NEURO: Pt is awake/alert, moves all extremities without difficulty EXTREMITIES:  full ROM, no signs of trauma are noted SKIN:  color normal PSYCH: agitated, aggressive behavior, yelling at staff  ED Course  Procedures  CRITICAL CARE Performed by: Ripley Fraise, MD Total critical care time: 64 Critical care time was exclusive of separately billable procedures and treating other patients. Critical care was necessary to treat or prevent imminent or life-threatening deterioration. Critical care was time spent personally by me on the following activities: development of treatment plan with patient and/or surrogate as well as nursing, discussions with consultants, evaluation of patient's response to treatment, examination of patient, obtaining history from patient or surrogate, ordering and performing treatments and interventions, ordering and review of laboratory studies, ordering and review of radiographic studies, pulse oximetry and re-evaluation of patient's condition. PATIENT REQUIRED IM HALDOL/ATIVAN/BENADRYL FOR AGITATION/PSYCHOSIS  DIAGNOSTIC STUDIES: Oxygen Saturation is 100% on RA, normal by my interpretation.    COORDINATION OF CARE: 10:31 AM Discussed treatment plan with pt. Pt acknowledges and agrees to plan.  11:34 AM PT VERY AGITATED YELLING/CURSING AT STAFF SHE IS USING HOMOPHOBIC SLURS SHE  REQUIRED SEDATION FOR PATIENT/STAFF PROTECTION IVC/FIRST EXAM COMPLETED 1:51 PM Pt is now resting comfortably She allowed labs to be drawn She will need psych evaluation She has h/o schizophrenia, suspect psychotic episode   Labs Review Labs Reviewed  CBC WITH DIFFERENTIAL/PLATELET - Abnormal; Notable for the following:    Neutrophils Relative % 83 (*)    All other components within normal limits  COMPREHENSIVE METABOLIC PANEL - Abnormal; Notable for the following:    Glucose, Bld 125 (*)    BUN 21 (*)    Creatinine, Ser 1.17 (*)    Total Protein 8.2 (*)    GFR calc non Af Amer 55 (*)    All other components within normal limits  ETHANOL  URINE RAPID DRUG SCREEN, HOSP PERFORMED    Medications  ziprasidone (GEODON) injection 10 mg (not administered)  haloperidol lactate (HALDOL) injection 5 mg (5 mg Intramuscular Given 07/15/15 1136)  LORazepam (ATIVAN) injection 1 mg (1 mg Intramuscular Given 07/15/15  1136)  diphenhydrAMINE (BENADRYL) injection 25 mg (25 mg Intramuscular Given 07/15/15 1134)     MDM   Final diagnoses:  Schizophrenia, unspecified type  Aggressive behavior    Nursing notes including past medical history and social history reviewed and considered in documentation Labs/vital reviewed myself and considered during evaluation Previous records reviewed and considered   I personally performed the services described in this documentation, which was scribed in my presence. The recorded information has been reviewed and is accurate.      Ripley Fraise, MD 07/15/15 [phone]

## 2015-07-15 NOTE — ED Notes (Signed)
MD Wickline at bedside. 

## 2015-07-15 NOTE — BHH Counselor (Signed)
This Probation officer faxed out supporting documentation for patient placement to the following facilities:  [facility], [place] Counselor

## 2015-07-15 NOTE — ED Notes (Addendum)
MD Wickline at bedside. Pt uncooperative with staff. Pt refuses to change into paper scrubs. Pt cursing at staff. [employer] at bedside.Wanded by security.

## 2015-07-15 NOTE — ED Notes (Signed)
Pt calm at this time.

## 2015-07-15 NOTE — ED Notes (Signed)
Attempted to give patient oral Haldol. Pt states, "I do not want to take any medication. There is nothing wrong with me." Pt calm at this time. Pt still refuses to change into paper scrubs. MD Wickline notified.

## 2015-07-15 NOTE — ED Notes (Signed)
Pt here on IVC due to hearing voices, stating that husband is gay, and that churches are conspiring against her.  Pt uncooperative at triage and states that she is not telling us anything because God already knows why she is here.

## 2015-07-15 NOTE — ED Notes (Signed)
MD Wickline at bedside. Pt woke up and refused to be examined at this time.

## 2015-07-15 NOTE — ED Notes (Signed)
TTS machine placed at bedside. Pt states, "I have nothing to say."

## 2015-07-15 NOTE — ED Notes (Addendum)
Pt asleep. MD Wickline notified. [employer] continues to be at bedside.

## 2015-07-15 NOTE — ED Notes (Signed)
Pt continues to remain uncooperative. Pt refuses lab draws and refuses to change into paper scrubs.

## 2015-07-15 NOTE — ED Notes (Signed)
Pt resting comfortably

## 2015-07-15 NOTE — ED Notes (Signed)
Pt became combative when trying to give IM injections. Pt had to be restrained by [employer].

## 2015-07-15 NOTE — ED Notes (Signed)
TTS consult in progress. Pt refusing to speak to TTS. Pt continuously stating, "I have nothing to say."

## 2015-07-15 NOTE — ED Notes (Signed)
Pt refused dinner meal tray.

## 2015-07-15 NOTE — ED Notes (Signed)
Gave pt Coke to drink.

## 2015-07-15 NOTE — BH Assessment (Signed)
07/15/15.  Attempt made to assess pt through telepsych.  Pt refused assessment stating multiple times that "I have nothing to say."  Despite efforts to engage, client refused to answer any questions.  TTS contacted three phone numbers listed in pt information but was not able to reach anyone.  TTS spoke to Dr. Lacinda Axon at [facility] who reports pt has been uncooperative since her arrival.  Pt clearly needs treatment and they will hold pt for inpt bed. Lurline Idol, LCSW

## 2015-07-15 NOTE — ED Notes (Signed)
Pt offered a meal tray. Pt refused, stating "I am not hungry."

## 2015-07-15 NOTE — BH Assessment (Signed)
Contacted [facility] to follow up and determine if Pt is ready to participate in tele-assessment. Ginger, RN said Pt will not participate. Clayborne Dana, [facility] at [facility], said there is an an appropriate bed available. Asked Ginger, RN to please contact TTS when Pt is willing to participate.  Orpah Greek Anson Fret, [place], [facility], [facility] Triage Specialist [phone]

## 2015-07-16 NOTE — ED Notes (Signed)
Pt awake now. Declined breakfast but is eating peanut butter crackers.

## 2015-07-16 NOTE — ED Notes (Signed)
Pt refused all medications. Stated the Risperdal was a psych med and she was not taking it. Stated the  her blood pressure is not up, it is our machines. They are old and they do not read right.

## 2015-07-16 NOTE — ED Notes (Signed)
Sitter with patient. Patient asleep on stretcher, no distress noted.

## 2015-07-16 NOTE — ED Notes (Signed)
Error in documentation regarding pain

## 2015-07-16 NOTE — ED Notes (Signed)
Pt up to the bathroom. No needs at present

## 2015-07-16 NOTE — Progress Notes (Signed)
Pt's case reviewed by Dr. Dwyane Dee. Dr. Dwyane Dee advises pt meets inpatient criteria due to active psychotic symptoms (auditory hallucinations, delusional thought). Pt is under IVC. Seeking inpatient placement.   Sharren Bridge, MSW, [place] Clinical Social Work, Disposition  07/16/2015 [phone]

## 2015-07-16 NOTE — Progress Notes (Signed)
Followed up on inpatient placement efforts.  Referral made: [facility]- per Caryl Pina no beds currently but fax for review [facility]- per Marden Noble, same as above [facility]- per [facility]- per [facility]- per [facility]- per [facility]  At capacity: [facility]  Left voicemail with Mayer Camel, will refer if there is availability [place]- fax machine down, unable to accept referrals but check back later in the day per Ginger Organ, MSW, [place] Work, Disposition  07/16/2015 [phone]

## 2015-07-16 NOTE — ED Notes (Signed)
Pt cooperative today other than refusing her medication.

## 2015-07-16 NOTE — ED Notes (Signed)
Pt has dozed back off. Sitter continued with pt.

## 2015-07-16 NOTE — ED Notes (Signed)
Pt agreed to take her BP med this time.

## 2015-07-16 NOTE — ED Notes (Signed)
Pt declined lunch. Stated she would like to have some baked chicken. Nurse called dietary to see if they had chicken today and they did not. Pt aware

## 2015-07-16 NOTE — ED Notes (Signed)
Pt refused to have her BP rechecked. Stated God told here it was not high and we were just trying to keep her here

## 2015-07-16 NOTE — ED Notes (Signed)
Officer here to transport pt to jail

## 2015-09-06 ENCOUNTER — Emergency Department (HOSPITAL_COMMUNITY)
Admission: EM | Admit: 2015-09-06 | Discharge: 2015-09-06 | Discharge status: Against Medical Advice | Payer: BLUE CROSS/BLUE SHIELD | Attending: Emergency Medicine | Admitting: Emergency Medicine

## 2015-09-06 ENCOUNTER — Inpatient Hospital Stay
Admit: 2015-09-06 | Discharge: 2015-09-11 | DRG: 885 | Disposition: A | Discharge status: Home / Self Care | Payer: BLUE CROSS/BLUE SHIELD | Source: Intra-hospital | Attending: Psychiatry | Admitting: Psychiatry

## 2015-09-06 ENCOUNTER — Encounter (HOSPITAL_COMMUNITY): Payer: Self-pay | Admitting: Emergency Medicine

## 2015-09-06 DIAGNOSIS — F200 Paranoid schizophrenia: Secondary | ICD-10-CM | POA: Diagnosis present

## 2015-09-06 DIAGNOSIS — Z8042 Family history of malignant neoplasm of prostate: Secondary | ICD-10-CM | POA: Diagnosis not present

## 2015-09-06 DIAGNOSIS — Z800 Family history of malignant neoplasm of digestive organs: Secondary | ICD-10-CM | POA: Diagnosis not present

## 2015-09-06 DIAGNOSIS — Z008 Encounter for other general examination: Secondary | ICD-10-CM | POA: Insufficient documentation

## 2015-09-06 DIAGNOSIS — F259 Schizoaffective disorder, unspecified: Secondary | ICD-10-CM | POA: Diagnosis present

## 2015-09-06 DIAGNOSIS — I10 Essential (primary) hypertension: Secondary | ICD-10-CM | POA: Insufficient documentation

## 2015-09-06 DIAGNOSIS — I129 Hypertensive chronic kidney disease with stage 1 through stage 4 chronic kidney disease, or unspecified chronic kidney disease: Secondary | ICD-10-CM | POA: Diagnosis present

## 2015-09-06 DIAGNOSIS — G4700 Insomnia, unspecified: Secondary | ICD-10-CM | POA: Diagnosis present

## 2015-09-06 DIAGNOSIS — R45851 Suicidal ideations: Secondary | ICD-10-CM | POA: Diagnosis present

## 2015-09-06 DIAGNOSIS — Z9119 Patient's noncompliance with other medical treatment and regimen: Secondary | ICD-10-CM | POA: Diagnosis present

## 2015-09-06 DIAGNOSIS — Z833 Family history of diabetes mellitus: Secondary | ICD-10-CM

## 2015-09-06 DIAGNOSIS — N189 Chronic kidney disease, unspecified: Secondary | ICD-10-CM | POA: Diagnosis present

## 2015-09-06 DIAGNOSIS — Z8249 Family history of ischemic heart disease and other diseases of the circulatory system: Secondary | ICD-10-CM | POA: Diagnosis not present

## 2015-09-06 DIAGNOSIS — R454 Irritability and anger: Secondary | ICD-10-CM | POA: Diagnosis present

## 2015-09-06 DIAGNOSIS — Z79899 Other long term (current) drug therapy: Secondary | ICD-10-CM

## 2015-09-06 DIAGNOSIS — E119 Type 2 diabetes mellitus without complications: Secondary | ICD-10-CM | POA: Diagnosis present

## 2015-09-06 LAB — GLUCOSE, CAPILLARY: Glucose-Capillary: 113 mg/dL — ABNORMAL HIGH (ref 65–99)

## 2015-09-06 MED ORDER — DIVALPROEX SODIUM 125 MG PO CSDR
500.0000 mg | DELAYED_RELEASE_CAPSULE | Freq: Three times a day (TID) | ORAL | Status: DC
  Administered 2015-09-06 – 2015-09-10 (×12): 500 mg via ORAL
  Filled 2015-09-06 (×16): qty 4

## 2015-09-06 MED ORDER — HYDRALAZINE HCL 25 MG PO TABS
50.0000 mg | ORAL_TABLET | Freq: Three times a day (TID) | ORAL | Status: DC
  Administered 2015-09-06 – 2015-09-11 (×15): 50 mg via ORAL
  Filled 2015-09-06 (×16): qty 2

## 2015-09-06 MED ORDER — GLUCERNA SHAKE PO LIQD
237.0000 mL | Freq: Three times a day (TID) | ORAL | Status: DC
  Administered 2015-09-06 – 2015-09-11 (×11): 237 mL via ORAL

## 2015-09-06 MED ORDER — ZOLPIDEM TARTRATE 5 MG PO TABS: 5.0000 mg | ORAL_TABLET | Freq: Every evening | ORAL | Status: DC | PRN

## 2015-09-06 MED ORDER — LEVOTHYROXINE SODIUM 50 MCG PO TABS
125.0000 ug | ORAL_TABLET | Freq: Every day | ORAL | Status: DC
  Administered 2015-09-07 – 2015-09-11 (×5): 125 ug via ORAL
  Filled 2015-09-06 (×6): qty 1

## 2015-09-06 MED ORDER — LORAZEPAM 1 MG PO TABS
1.0000 mg | ORAL_TABLET | Freq: Once | ORAL | Status: AC
  Administered 2015-09-06: 1 mg via ORAL
  Filled 2015-09-06: qty 1

## 2015-09-06 MED ORDER — ACETAMINOPHEN 325 MG PO TABS: 650.0000 mg | ORAL_TABLET | Freq: Four times a day (QID) | ORAL | Status: DC | PRN

## 2015-09-06 MED ORDER — ALUM & MAG HYDROXIDE-SIMETH 200-200-20 MG/5ML PO SUSP: 30.0000 mL | ORAL | Status: DC | PRN

## 2015-09-06 MED ORDER — BENZTROPINE MESYLATE 1 MG PO TABS
0.5000 mg | ORAL_TABLET | Freq: Two times a day (BID) | ORAL | Status: DC
  Administered 2015-09-06 – 2015-09-11 (×11): 0.5 mg via ORAL
  Filled 2015-09-06 (×12): qty 1

## 2015-09-06 MED ORDER — RISPERIDONE 1 MG PO TBDP
3.0000 mg | ORAL_TABLET | Freq: Two times a day (BID) | ORAL | Status: DC
  Administered 2015-09-06 (×2): 3 mg via ORAL
  Filled 2015-09-06 (×5): qty 1

## 2015-09-06 MED ORDER — CLONAZEPAM 0.5 MG PO TABS
0.5000 mg | ORAL_TABLET | Freq: Two times a day (BID) | ORAL | Status: DC
  Administered 2015-09-06 – 2015-09-10 (×7): 0.5 mg via ORAL
  Filled 2015-09-06 (×10): qty 1

## 2015-09-06 MED ORDER — TRAZODONE HCL 100 MG PO TABS
100.0000 mg | ORAL_TABLET | Freq: Every day | ORAL | Status: DC
  Administered 2015-09-07 – 2015-09-10 (×4): 100 mg via ORAL
  Filled 2015-09-06 (×4): qty 1

## 2015-09-06 MED ORDER — LORAZEPAM 1 MG PO TABS
1.0000 mg | ORAL_TABLET | Freq: Three times a day (TID) | ORAL | Status: DC
  Administered 2015-09-06: 1 mg via ORAL
  Filled 2015-09-06 (×2): qty 1

## 2015-09-06 MED ORDER — METFORMIN HCL ER 500 MG PO TB24
500.0000 mg | ORAL_TABLET | Freq: Every day | ORAL | Status: DC
  Administered 2015-09-07 – 2015-09-11 (×5): 500 mg via ORAL
  Filled 2015-09-06 (×6): qty 1

## 2015-09-06 MED ORDER — METOPROLOL SUCCINATE ER 25 MG PO TB24
50.0000 mg | ORAL_TABLET | Freq: Every day | ORAL | Status: DC
  Administered 2015-09-06 – 2015-09-11 (×6): 50 mg via ORAL
  Filled 2015-09-06 (×7): qty 2

## 2015-09-06 MED ORDER — PALIPERIDONE PALMITATE 234 MG/1.5ML IM SUSP
234.0000 mg | Freq: Once | INTRAMUSCULAR | Status: AC
  Administered 2015-09-06: 234 mg via INTRAMUSCULAR
  Filled 2015-09-06: qty 1.5

## 2015-09-06 MED ORDER — RISPERIDONE 3 MG PO TABS: 3.0000 mg | ORAL_TABLET | Freq: Two times a day (BID) | ORAL | Status: DC

## 2015-09-06 MED ORDER — AMLODIPINE BESYLATE 10 MG PO TABS
10.0000 mg | ORAL_TABLET | Freq: Every day | ORAL | Status: DC
  Administered 2015-09-06 – 2015-09-11 (×6): 10 mg via ORAL
  Filled 2015-09-06 (×7): qty 1

## 2015-09-06 MED ORDER — MAGNESIUM HYDROXIDE 400 MG/5ML PO SUSP: 30.0000 mL | Freq: Every day | ORAL | Status: DC | PRN

## 2015-09-06 MED ORDER — HYDROCHLOROTHIAZIDE 25 MG PO TABS
25.0000 mg | ORAL_TABLET | Freq: Every day | ORAL | Status: DC
  Administered 2015-09-06 – 2015-09-11 (×6): 25 mg via ORAL
  Filled 2015-09-06 (×8): qty 1

## 2015-09-06 NOTE — ED Notes (Signed)
Patient sent from [facility] to [facility] for transportation to [facility]. Family reports that they are unable to stay with her at [facility] and [facility] told them to bring her here. "We would rather her be here too" Explained to family that this facility does not have psychiatric services and that she would have to go through medical clearance again, see an off-site psychiatrist, and then the placement process might have to be re-completed because [facility] might not hold her bed if she is not there within a certain time-frame. Family understands need for admission to psych facility. Family states understanding and states they will transport her to [facility]. Directions to facility printed and given to family. Patient left with family.

## 2015-09-06 NOTE — BHH Suicide Risk Assessment (Signed)
[facility] Admission Suicide Risk Assessment   Nursing information obtained from:    Demographic factors:    Current Mental Status:    Loss Factors:    Historical Factors:    Risk Reduction Factors:    Total Time spent with patient: 1 hour Principal Problem: Paranoid schizophrenia Diagnosis:   Patient Active Problem List   Diagnosis Date Noted  . Paranoid schizophrenia [F20.0] 07/20/2014  . 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 Contact dermatitis [L25.9] 01/18/2013  . Vaginitis [N76.0] 01/18/2013  . Essential hypertension, benign [I10] 12/24/2012  . Heartburn [R12] 12/24/2012  . Hypothyroidism [E03.9] 12/24/2012  . 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 Acute DVT (deep venous thrombosis) [I82.409] 09/27/2012  . Chronic kidney disease [N18.9] 08/28/2012  . Fibroid uterus [D25.9] 06/12/2012     Continued Clinical Symptoms:  Alcohol Use Disorder Identification Test Final Score (AUDIT): 0 The "Alcohol Use Disorders Identification Test", Guidelines for Use in Primary Care, Second Edition.  World Pharmacologist [facility]). Score between 0-7:  no or low risk or alcohol related problems. Score between 8-15:  moderate risk of alcohol related problems. Score between 16-19:  high risk of alcohol related problems. Score 20 or above:  warrants further diagnostic evaluation for alcohol dependence and treatment.   CLINICAL FACTORS:   Schizophrenia:   Paranoid or undifferentiated type   Musculoskeletal: Strength & Muscle Tone: within normal limits Gait & Station: normal Patient leans: N/A  Psychiatric Specialty Exam: Physical Exam  Nursing note and vitals reviewed. Constitutional: She is oriented to person, place, and time. She appears well-developed and well-nourished.  HENT:  Head: Normocephalic and atraumatic.  Eyes: Conjunctivae and EOM are normal. Pupils are equal, round, and reactive to light.  Neck:  Normal range of motion. Neck supple.  Cardiovascular: Normal rate, regular rhythm and normal heart sounds.   Respiratory: Effort normal and breath sounds normal.  GI: Soft. Bowel sounds are normal.  Musculoskeletal: Normal range of motion.  Neurological: She is alert and oriented to person, place, and time.  Skin: Skin is warm and dry.    Review of Systems  All other systems reviewed and are negative.   Last menstrual period 09/18/2012.There is no weight on file to calculate BMI.  General Appearance: Casual  Eye Contact::  Fair  Speech:  Clear and Coherent  Volume:  Normal  Mood:  Dysphoric  Affect:  Labile  Thought Process:  Linear  Orientation:  Full (Time, Place, and Person)  Thought Content:  Delusions and Paranoid Ideation  Suicidal Thoughts:  No  Homicidal Thoughts:  No  Memory:  Immediate;   Fair Recent;   Fair Remote;   Fair  Judgement:  Impaired  Insight:  Lacking  Psychomotor Activity:  Normal  Concentration:  Fair  Recall:  [place]  Language: Fair  Akathisia:  No  Handed:  Right  AIMS (if indicated):     Assets:  Communication Skills Desire for Improvement Financial Resources/Insurance  Sleep:     Cognition: WNL  ADL's:  Intact     COGNITIVE FEATURES THAT CONTRIBUTE TO RISK:  None    SUICIDE RISK:   Moderate:  Frequent suicidal ideation with limited intensity, and duration, some specificity in terms of plans, no associated intent, good self-control, limited dysphoria/symptomatology, some risk factors present, and identifiable protective factors, including available and accessible social support.  PLAN OF CARE: Hospital admission, medication management, discharge planning.  Medical Decision Making:  New problem, with additional work up planned, Review of Psycho-Social Stressors (1), Review or order clinical lab tests (1), Review of Medication Regimen & Side Effects (2) and Review of New Medication or Change in Dosage (2)   [name] is a 44 year old female with a history of schizophrenia admitted for disorganized psychotic, possibly manic behavior in the context of medication noncompliance.  1. Psychosis. He maintained on Risperdal 7.5 mg daily. We'll continue. She appears manic she may need a mood stabilizer.  2. Hypertension. We will continue Norvasc hydralazine and hydrochlorothiazide.  3. Diabetes. Metformin with blood glucose monitoring and diabetic diet.  4. Anxiety. We'll continue low-dose Klonopin.  5. Thyroid disease. We'll continue Synthroid.  6. Disposition. She will return home most likely. She will follow up with her regular psychiatrist   I certify that inpatient services furnished can reasonably be expected to improve the patient's condition.   [name] 09/06/2015, 2:12 PM

## 2015-09-06 NOTE — BHH Group Notes (Signed)
[facility] Group Notes:  (Nursing/MHT/Case Management/Adjunct)  Date:  09/06/2015  Time:  2:16 PM  Type of Therapy:  Group Therapy  Participation Level:  Active  Participation Quality:  Appropriate  Affect:  Appropriate  Cognitive:  Appropriate  Insight:  Good  Engagement in Group:  Engaged  Modes of Intervention:  Support  Summary of Progress/Problems:  [name] 09/06/2015, 2:16 PM

## 2015-09-06 NOTE — ED Notes (Addendum)
PT and family reports pt has had increased anxiety with flat affect and SI with depression worsening in the past 4 days. PT denies any visual or auditory hallucinations. PT states SI with no plan. PT was sent to [facility] from [facility] for medical clearance for placement at [facility].

## 2015-09-06 NOTE — BHH Group Notes (Signed)
[place] LCSW Group Therapy  09/06/2015 4:01 PM  Type of Therapy:  Group Therapy  Participation Level:  Did Not Attend  Modes of Intervention:  Discussion, Education, Socialization and Support  Summary of Progress/Problems:Balance in life: Patients will discuss the concept of balance and how it looks and feels to be unbalanced. Pt will identify areas in their life that is unbalanced and ways to become more balanced.    [place] MSW, [place]  09/06/2015, 4:01 PM

## 2015-09-06 NOTE — Progress Notes (Signed)
Recreation Therapy Notes  Date: 09.08.16 Time: 4:40 pm Location: Craft Room  Group Topic: Leisure Education  Goal Area(s) Addresses:  Patient will identify activities for each letter of the alphabet. Patient will verbalize ability to integrate positive leisure into life post d/c. Patient will verbalize ability to use leisure as a Technical sales engineer.  Behavioral Response: Attentive, Interactive  Intervention: Leisure Alphabet  Activity: Patients were given a Leisure Air traffic controller and instructed to write a healthy leisure activity for each letter of the alphabet.   Education: LRT educated patient on things that are needed to participate in leisure  Education Outcome: In group clarification offered  Clinical Observations/Feedback: Patient completed approximately 50% of worksheet. Patient contributed to group discussion by stating some healthy leisure activities she wrote down.  Leonette Monarch, LRT/CTRS 09/06/2015 4:47 PM
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
## 2015-09-06 NOTE — Tx Team (Signed)
Initial Interdisciplinary Treatment Plan   PATIENT STRESSORS: Financial difficulties Health problems Medication change or noncompliance   PATIENT STRENGTHS: Communication skills Supportive family/friends   PROBLEM LIST: Problem List/Patient Goals Date to be addressed Date deferred Reason deferred Estimated date of resolution  Schizophrenia       hypertension                                                 DISCHARGE CRITERIA:  Improved stabilization in mood, thinking, and/or behavior Motivation to continue treatment in a less acute level of care Verbal commitment to aftercare and medication compliance  PRELIMINARY DISCHARGE PLAN: Return to previous living arrangement  PATIENT/FAMIILY INVOLVEMENT: This treatment plan has been presented to and reviewed with the patient, [name].  The patient and family have been given the opportunity to ask questions and make suggestions.  Viviann Spare 09/06/2015, 6:12 PM

## 2015-09-06 NOTE — H&P (Signed)
Psychiatric Admission Assessment Adult  Patient Identification: [name] MRN:  [id number] Date of Evaluation:  09/06/2015 Chief Complaint:  Schizophrenia Principal Diagnosis: Paranoid schizophrenia Diagnosis:   Patient Active Problem List   Diagnosis Date Noted  . Paranoid schizophrenia [F20.0] 07/20/2014  . 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 Contact dermatitis [L25.9] 01/18/2013  . Vaginitis [N76.0] 01/18/2013  . Essential hypertension, benign [I10] 12/24/2012  . Heartburn [R12] 12/24/2012  . Hypothyroidism [E03.9] 12/24/2012  . 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 Acute DVT (deep venous thrombosis) [I82.409] 09/27/2012  . Chronic kidney disease [N18.9] 08/28/2012  . Fibroid uterus [D25.9] 06/12/2012   History of Present Illness::   Identifying data. [name] is a 44 year old female with history of schizoaffective disorder.  Chief complaint. "I need a therapist."  History of present illness. [name] has a long history of mental illness. She has been off her appetite psychotic for at least a year now. In the past 2 weeks she became insomniac, restless, lost her appetite, has racing thoughts, became irritable and somewhat unpredictable. She made an appointment with a therapist for next week. She felt so bad that she came to crisis center asking for help. She reports that in the past when on Risperdal she did very well. She feels anxious, restless, and tired from not sleeping or eating. She denies psychotic symptoms. She denies suicidal or homicidal ideation. She denies alcohol or illicit substance use.  Past psychiatric history. There are several hospitalizations at [facility], [place] and [facility] always for manic and sometimes psychotic episodes. She remembers being treated with Depakote in the past but does not remember any other medication except for Risperdal. She denies ever  attempting suicide.  Family psychiatric history. Nonreported.  Social history. She graduated from high school and obtained a degree in early childhood development. She has not been employed recently as she cannot find a job. She believes that she could work. She has been married for 7 years. She lives with her husband was supportive. She has no children. She has Multimedia programmer.  Total Time spent with patient: 1 hour  Past Medical History:  Past Medical History  Diagnosis Date  . Thyroid disease   . Fibroid tumor   . DVT (deep venous thrombosis) 09/30/12    in upper extremities b/l and LLE  . Anemia   . Thyroid nodule     s/p biopsy  . Graves disease   . Multinodular goiter 10/01/2012  . Lupus anticoagulant positive 10/02/2012  . Hypertension   . 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 Arnoldo Morale on 12/13/2012.  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 Paranoid schizophrenia   . Delusional disorder     Past Surgical History  Procedure Laterality Date  . Supracervical abdominal hysterectomy  09/27/2012    Procedure: HYSTERECTOMY SUPRACERVICAL ABDOMINAL;  Surgeon: Jonnie Kind, MD;  Location: [facility];  Service: Gynecology;  Laterality: N/A;  . Hematoma evacuation  10/03/2012    Procedure: EVACUATION HEMATOMA;  Surgeon: Jonnie Kind, MD;  Location: [facility];  Service: Gynecology;  Laterality: N/A;  . Thyroidectomy  12/13/2012    Procedure: THYROIDECTOMY;  Surgeon: Jamesetta So, MD;  Location: [facility];  Service: General;  Laterality: Bilateral;  . Abdominal hysterectomy     Family History:  Family History  Problem Relation Age of Onset  . Diabetes Mother   .  Hypertension Mother   . Cancer Father     prostate   . Cancer Sister     pancreatic cancer   Social History:  History  Alcohol Use No     History  Drug Use No    Social History   Social History  . Marital Status: Married    Spouse Name: N/A  . Number of  Children: N/A  . Years of Education: N/A   Social History Main Topics  . Smoking status: Never Smoker   . Smokeless tobacco: Never Used  . Alcohol Use: No  . Drug Use: No  . Sexual Activity: Yes    Birth Control/ Protection: Surgical   Other Topics Concern  . None   Social History Narrative   Additional Social History:                          Musculoskeletal: Strength & Muscle Tone: within normal limits Gait & Station: normal Patient leans: N/A  Psychiatric Specialty Exam: Physical Exam  Nursing note and vitals reviewed.   Review of Systems  All other systems reviewed and are negative.   Blood pressure 139/94, pulse 104, temperature 98.1 F (36.7 C), temperature source Oral, resp. rate 17, height 5\' 9"  (1.753 m), weight 120.657 kg (266 lb), last menstrual period 09/18/2012, SpO2 95 %.Body mass index is 39.26 kg/(m^2).  See SRA,                                                  Sleep:      Risk to Self: Is patient at risk for suicide?: No Risk to Others:   Prior Inpatient Therapy:   Prior Outpatient Therapy:    Alcohol Screening: 1. How often do you have a drink containing alcohol?: Never 9. Have you or someone else been injured as a result of your drinking?: No 10. Has a relative or friend or a doctor or another health worker been concerned about your drinking or suggested you cut down?: No Alcohol Use Disorder Identification Test Final Score (AUDIT): 0 Brief Intervention: AUDIT score less than 7 or less-screening does not suggest unhealthy drinking-brief intervention not indicated  Allergies:  No Known Allergies Lab Results: No results found for this or any previous visit (from the past 25 hour(s)). Current Medications: Current Facility-Administered Medications  Medication Dose Route Frequency Provider Last Rate Last Dose  . acetaminophen (TYLENOL) tablet 650 mg  650 mg Oral Q6H PRN Reiley Keisler B Esme Durkin, MD      . alum & mag  hydroxide-simeth (MAALOX/MYLANTA) 200-200-20 MG/5ML suspension 30 mL  30 mL Oral Q4H PRN Madolyn Ackroyd B Yakub Lodes, MD      . amLODipine (NORVASC) tablet 10 mg  10 mg Oral Daily Willa Brocks B Cleavon Goldman, MD      . benztropine (COGENTIN) tablet 0.5 mg  0.5 mg Oral BID Biddie Sebek B Jakeob Tullis, MD      . clonazePAM (KLONOPIN) tablet 0.5 mg  0.5 mg Oral BID Jhada Risk B Dody Smartt, MD      . hydrALAZINE (APRESOLINE) tablet 50 mg  50 mg Oral 3 times per day Harli Engelken B Murlene Revell, MD      . hydrochlorothiazide (HYDRODIURIL) tablet 25 mg  25 mg Oral Daily Griff Badley B Kanyah Matsushima, MD      . Derrill Memo ON 09/07/2015] levothyroxine (SYNTHROID, LEVOTHROID) tablet 125 mcg  125 mcg Oral QAC breakfast Jerl Munyan B Kiandra Sanguinetti, MD      . magnesium hydroxide (MILK OF MAGNESIA) suspension 30 mL  30 mL Oral Daily PRN Clovis Fredrickson, MD      . Derrill Memo ON 09/07/2015] metFORMIN (GLUCOPHAGE-XR) 24 hr tablet 500 mg  500 mg Oral Q breakfast Fronnie Urton B Wynetta Seith, MD      . metoprolol succinate (TOPROL-XL) 24 hr tablet 50 mg  50 mg Oral Daily Ayumi Wangerin B Jermone Geister, MD      . risperiDONE (RISPERDAL) tablet 3 mg  3 mg Oral BID Adylene Dlugosz B Deoni Cosey, MD      . traZODone (DESYREL) tablet 100 mg  100 mg Oral QHS Rhyland Hinderliter B Aizley Stenseth, MD       PTA Medications: Prescriptions prior to admission  Medication Sig Dispense Refill Last Dose  . amLODipine (NORVASC) 10 MG tablet Take 1 tablet (10 mg total) by mouth daily. 30 tablet 0 09/05/2015 at Unknown time  . benztropine (COGENTIN) 0.5 MG tablet Take 1 tablet (0.5 mg total) by mouth 2 (two) times daily. 60 tablet 0 09/05/2015 at Unknown time  . clonazePAM (KLONOPIN) 0.5 MG tablet Take 1 tablet (0.5 mg total) by mouth 2 (two) times daily. 60 tablet 0 09/05/2015 at Unknown time  . hydrALAZINE (APRESOLINE) 50 MG tablet Take 1 tablet (50 mg total) by mouth every 8 (eight) hours. 90 tablet 0 09/05/2015 at Unknown time  . hydrochlorothiazide (HYDRODIURIL) 25 MG tablet Take 1 tablet (25 mg total) by mouth daily. 30 tablet 0  09/05/2015 at Unknown time  . levothyroxine (SYNTHROID) 125 MCG tablet Take 1 tablet (125 mcg total) by mouth daily. 90 tablet 0 09/05/2015 at Unknown time  . metFORMIN (GLUCOPHAGE-XR) 500 MG 24 hr tablet Take 1 tablet (500 mg total) by mouth daily with breakfast.  5 09/05/2015 at Unknown time  . metoprolol (LOPRESSOR) 50 MG tablet Take 1 tablet (50 mg total) by mouth 2 (two) times daily. 60 tablet 0 09/05/2015 at Unknown time  . risperiDONE (RISPERDAL M-TABS) 0.5 MG disintegrating tablet Take 7 tablets (3.5 mg total) by mouth at bedtime. 210 tablet 0 09/05/2015 at Unknown time  . risperiDONE (RISPERDAL M-TABS) 3 MG disintegrating tablet Take 1 tablet (3 mg total) by mouth daily with breakfast. 30 tablet 0 09/05/2015 at Unknown time  . traZODone (DESYREL) 100 MG tablet Take 1 tablet (100 mg total) by mouth at bedtime. 30 tablet 0 09/05/2015 at Unknown time    Previous Psychotropic Medications: Yes   Substance Abuse History in the last 12 months:  Yes.      Consequences of Substance Abuse: NA  No results found for this or any previous visit (from the past 72 hour(s)).  Observation Level/Precautions:  15 minute checks  Laboratory:  CBC Chemistry Profile UDS UA  Psychotherapy:    Medications:    Consultations:    Discharge Concerns:    Estimated LOS:  Other:     Psychological Evaluations: No   Treatment Plan Summary: Daily contact with patient to assess and evaluate symptoms and progress in treatment and Medication management  Medical Decision Making:  New problem, with additional work up planned, Review of Psycho-Social Stressors (1), Review or order clinical lab tests (1), Review of Medication Regimen & Side Effects (2) and Review of New Medication or Change in Dosage (2)   Ms. Scovell is a 44 year old female with a history of schizophrenia admitted for disorganized psychotic, possibly manic behavior in the context of medication noncompliance.  1. Psychosis. We restarted  Risperdal and  offered Invega sustenna injection to improve compliance. We started Depakote for mood stabilization and Ativan for restlessness.  2. Hypertension. We will continue Norvasc hydralazine and hydrochlorothiazide.  3. Diabetes. Metformin with blood glucose monitoring and diabetic diet.  4. Anxiety. We'll continue low-dose Klonopin.  5. Thyroid disease. We'll continue Synthroid.  6. Insomnia. We will offer trazodone tonight. She has available if needed   7. Poor oral intake. We will offer Glucerna.   8. Disposition. She will return home most likely. She will follow up with her regular psychiatrist   I certify that inpatient services furnished can reasonably be expected to improve the patient's condition.   Dorrien Grunder 9/8/20162:53 PM

## 2015-09-06 NOTE — Progress Notes (Signed)
Admission note  D: patient came in today from [facility], stating she is feeling overwhelmed and cant be still.  She denies any SI/HI.  Patient has a hx of hearing voices and racing thoughts.  States she wants to see a therapist and talk one on one.  She states she can not sleep and has no appetite.  She was having random suicidal thoughts but no plan or means/ intention.  Patient states she paces to calm down.  Patient presenting in her own clothes and disheveled. Making minimal eye contact and a flat affect.   A: Skin and belonging search done with no contraband found.  Patient shown around the unit and given the unit rules.  Went over patients care plan and education about medication.  Encouraged patient to attend groups and when to seek help if depression or anxiety worsens R:   Patient receptive of information and will try and attend groups.

## 2015-09-07 LAB — GLUCOSE, CAPILLARY
GLUCOSE-CAPILLARY: 134 mg/dL — AB (ref 65–99)
Glucose-Capillary: 109 mg/dL — ABNORMAL HIGH (ref 65–99)

## 2015-09-07 MED ORDER — LORAZEPAM 0.5 MG PO TABS: 0.5000 mg | ORAL_TABLET | Freq: Four times a day (QID) | ORAL | Status: DC | PRN

## 2015-09-07 MED ORDER — RISPERIDONE 1 MG PO TBDP
3.0000 mg | ORAL_TABLET | Freq: Two times a day (BID) | ORAL | Status: DC
  Administered 2015-09-07 – 2015-09-10 (×7): 3 mg via ORAL
  Filled 2015-09-07 (×7): qty 3

## 2015-09-07 NOTE — Progress Notes (Signed)
Initial Nutrition Assessment    INTERVENTION:   Meals and Snacks: Cater to patient preferences Medical Food Supplement Therapy: if appetite continues to improve with adequate po intake, recommend discontinuing Glucerna Shakes  NUTRITION DIAGNOSIS:   Inadequate oral intake related to social / environmental circumstances as evidenced by per patient/family report.  GOAL:   Patient will meet greater than or equal to 90% of their needs  MONITOR:    (Energy Intake, Anthropometrics, Digestive System, Electrolyte/Renal Profile)  REASON FOR ASSESSMENT:   Malnutrition Screening Tool    ASSESSMENT:    Pt admitted with paranoid schizophrenia  Past Medical History  Diagnosis Date  . Thyroid disease   . Fibroid tumor   . DVT (deep venous thrombosis) 09/30/12    in upper extremities b/l and LLE  . Anemia   . Thyroid nodule     s/p biopsy  . Graves disease   . Multinodular goiter 10/01/2012  . Lupus anticoagulant positive 10/02/2012  . Hypertension   . 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 Arnoldo Morale on 12/13/2012.  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 Paranoid schizophrenia   . Delusional disorder     Diet Order:  Diet Carb Modified Fluid consistency:: Thin; Room service appropriate?: Yes   Energy Intake: recorded po intake 60% on average; ate 90% at breakfast, 75% of lunch   Food and nutrition related history: pt with decreased appetite for 2 weeks prior to admission  Electrolyte and Renal Profile: No results for input(s): BUN, CREATININE, NA, K, MG, PHOS in the last 168 hours. Glucose Profile:   Recent Labs  09/06/15 1706 09/07/15 0658  GLUCAP 113* 134*   Meds: glucophage  Height:   Ht Readings from Last 1 Encounters:  09/06/15 5\' 9"  (1.753 m)    Weight: weight trend per encounters below  Wt Readings from Last 1 Encounters:  09/06/15 266 lb (120.657 kg)   Wt Readings from Last 10 Encounters:  09/06/15  266 lb (120.657 kg)  09/06/15 258 lb (117.028 kg)  07/15/15 220 lb (99.791 kg)  06/21/15 220 lb (99.791 kg)  06/03/15 240 lb (108.863 kg)  08/11/14 232 lb (105.235 kg)  07/19/14 290 lb (131.543 kg)  07/19/14 279 lb (126.554 kg)  07/08/14 278 lb (126.1 kg)  07/07/14 278 lb (126.1 kg    BMI:  Body mass index is 39.26 kg/(m^2).  LOW Care Level  Kerman Passey MS, [place], LDN [phone] Pager

## 2015-09-07 NOTE — Progress Notes (Signed)
[name] spent most of the evening in bed resting, she got up for medication and was med compliant. She remains sad and depressed although. She had minimal interaction with peers and staff, but she is cooperative with treatment. She  Appears to be resting in bed eyes closed at this time.

## 2015-09-07 NOTE — Progress Notes (Signed)
Recreation Therapy Notes  Date: 09.09.16 Time: 3:00 pm Location: Craft Room  Group Topic: Coping Skills  Goal Area(s) Addresses:  Patient will participate in coping skill. Patient will verbalize benefit of using art as a coping skill.  Behavioral Response: Arrived late, Attentive  Intervention: Coloring  Activity: Patients were given coloring sheets and instructed to color and think about the emotions they experienced.  Education: LRT educated patients on healthy coping skills.  Education Outcome: In group clarification offered  Clinical Observations/Feedback: Patient arrived to group at approximately 3:32 pm. Patient colored worksheet. Patient did not contribute to group discussion.  Leonette Monarch, LRT/CTRS 09/07/2015 4:24 PM

## 2015-09-07 NOTE — Plan of Care (Signed)
Problem: Alteration in mood Goal: LTG-Patient reports reduction in suicidal thoughts (Patient reports reduction in suicidal thoughts and is able to verbalize a safety plan for whenever patient is feeling suicidal)  Outcome: Progressing Patient denied suicidal thoughts on shift at this time.

## 2015-09-07 NOTE — Progress Notes (Signed)
D: Patient denies SI/HI/AVH.  Patient affect is flat and her mood is depressed.  Patient was lethargic. Patient did NOT attend evening group.  A: Support and encouragement offered. Due to patient's lethargic presentation, writer held scheduled Ativan, Klonopin, and Trazodone. Q 15 min checks continued for patient safety. R: Patient receptive. Patient remains safe on the unit.

## 2015-09-07 NOTE — BHH Group Notes (Signed)
[place] LCSW Group Therapy  09/07/2015 2:36 PM  Type of Therapy:  Group Therapy  Participation Level:  Active  Participation Quality:  Appropriate and Attentive  Affect:  Appropriate  Cognitive:  Alert, Appropriate and Oriented  Insight:  Engaged  Engagement in Therapy:  Engaged  Modes of Intervention:  Socialization and Support  Summary of Progress/Problems: Patient attended and participated in group discussion appropriately sharing that 3 things she would take on a deserted [place] would be "Bible, clothes, and a book on how to escape".   Keene Breath, MSW, LCSWA 09/07/2015, 2:36 PM

## 2015-09-07 NOTE — Progress Notes (Signed)
Recreation Therapy Notes  INPATIENT RECREATION THERAPY ASSESSMENT  Patient Details Name: [name] MRN: [id number] DOB: 19-Oct-1971 Today's Date: 09/07/2015  Patient Stressors: Family  Coping Skills:   Isolate, Arguments, Avoidance, Exercise, Art/Dance, Talking, Music, Sports, Other (Comment) (Relax)  Personal Challenges: Anger, Communication, Concentration, Decision-Making, Expressing Yourself, Problem-Solving, Relationships, Self-Esteem/Confidence, Social Interaction, Stress Management, Time Management, Trusting Others  Leisure Interests (2+):  Individual - Other (Comment) (Read, write poems)  Awareness of Community Resources:  No  Community Resources:     Current Use:    If no, Barriers?:    Patient Strengths:  Funny, good dancer  Patient Identified Areas of Improvement:  "Yeah, but I can't think of them now."  Current Recreation Participation:  Nothing  Patient Goal for Hospitalization:  To get better  [place] of Residence:  [place] of Residence:  [place]   Current [place] (including self-harm):  No  Current HI:  No  Consent to Intern Participation: N/A   Leonette Monarch, LRT/CTRS 09/07/2015, 2:51 PM

## 2015-09-07 NOTE — Progress Notes (Signed)
D: Patient stated slept good last night .Stated appetite is good and energy level  Is normal. Stated concentration is good . Stated on Depression scale 0, hopeless 0 and anxiety 0  .( low 0-10 high) Denies suicidal  homicidal ideations . No auditory hallucinations  No pain concerns . Appropriate ADL'S. Interacting with peers and staff. Voice of being very tired and sleeping during shift . Attempted to go to unit programming but left earlier A: Encourage patient participation with unit programming . Instruction  Given on  Medication , verbalize understanding. R: Voice no other concerns. Staff continue to monitor

## 2015-09-07 NOTE — BHH Group Notes (Signed)
[facility] LCSW Aftercare Discharge Planning Group Note   09/07/2015 4:37 PM  Participation Quality:  Minimal   Mood/Affect:  Flat  Depression Rating:  0  Anxiety Rating:  0  Thoughts of Suicide:  No Will you contract for safety?   NA  Current AVH:  No  Plan for Discharge/Comments:  Pt states she is feeling a little better today. She is unsure of her discharge plan at the moment.   Transportation Means: Public   Supports: [place] MSW, [employer]

## 2015-09-07 NOTE — Progress Notes (Signed)
[facility] MD Progress Note  09/07/2015 1:41 PM [name]  MRN:  [id number]  Subjective:  Ms. Gross is a 44 year old female with history of schizoaffective disorder admitted for a manic episode in the context of treatment noncompliance. She was restarted on Risperdal and Depakote. She was given [place] sustenna injection on 9/8 to improve compliance. She was restless and insomniac on admission.  She reports some improvement today. She slept well last night. Ativan has been helpful for restlessness. She however is oversedated from it and we will lower the dose. Her thinking is getting better. There are no racing thoughts. Appetite is good. She participates in programming. She tolerates medications well. There are no somatic complaints.  Principal Problem: Paranoid schizophrenia Diagnosis:   Patient Active Problem List   Diagnosis Date Noted  . Paranoid schizophrenia [F20.0] 07/20/2014  . 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 Contact dermatitis [L25.9] 01/18/2013  . Vaginitis [N76.0] 01/18/2013  . Essential hypertension, benign [I10] 12/24/2012  . Heartburn [R12] 12/24/2012  . Hypothyroidism [E03.9] 12/24/2012  . 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 Acute DVT (deep venous thrombosis) [I82.409] 09/27/2012  . Chronic kidney disease [N18.9] 08/28/2012  . Fibroid uterus [D25.9] 06/12/2012   Total Time spent with patient: 20 minutes   Past Medical History:  Past Medical History  Diagnosis Date  . Thyroid disease   . Fibroid tumor   . DVT (deep venous thrombosis) 09/30/12    in upper extremities b/l and LLE  . Anemia   . Thyroid nodule     s/p biopsy  . Graves disease   . Multinodular goiter 10/01/2012  . Lupus anticoagulant positive 10/02/2012  . Hypertension   . 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 Arnoldo Morale on 12/13/2012.  Marland Kitchen Anticardiolipin antibody positive  04/26/2013    IgG  . Paranoid schizophrenia   . Delusional disorder     Past Surgical History  Procedure Laterality Date  . Supracervical abdominal hysterectomy  09/27/2012    Procedure: HYSTERECTOMY SUPRACERVICAL ABDOMINAL;  Surgeon: Jonnie Kind, MD;  Location: [facility];  Service: Gynecology;  Laterality: N/A;  . Hematoma evacuation  10/03/2012    Procedure: EVACUATION HEMATOMA;  Surgeon: Jonnie Kind, MD;  Location: [facility];  Service: Gynecology;  Laterality: N/A;  . Thyroidectomy  12/13/2012    Procedure: THYROIDECTOMY;  Surgeon: Jamesetta So, MD;  Location: [facility];  Service: General;  Laterality: Bilateral;  . Abdominal hysterectomy     Family History:  Family History  Problem Relation Age of Onset  . Diabetes Mother   . Hypertension Mother   . Cancer Father     prostate   . Cancer Sister     pancreatic cancer   Social History:  History  Alcohol Use No     History  Drug Use No    Social History   Social History  . Marital Status: Married    Spouse Name: N/A  . Number of Children: N/A  . Years of Education: N/A   Social History Main Topics  . Smoking status: Never Smoker   . Smokeless tobacco: Never Used  . Alcohol Use: No  . Drug Use: No  . Sexual Activity: Yes    Birth Control/ Protection: Surgical   Other Topics Concern  . None   Social History Narrative   Additional History:    Sleep: Good  Appetite:  Fair  Assessment:   Musculoskeletal: Strength & Muscle Tone: within normal limits Gait & Station: normal Patient leans: N/A   Psychiatric Specialty Exam: Physical Exam  Nursing note and vitals reviewed.   Review of Systems  All other systems reviewed and are negative.   Blood pressure 124/94, pulse 104, temperature 98.2 F (36.8 C), temperature source Oral, resp. rate 18, height 5\' 9"  (1.753 m), weight 120.657 kg (266 lb), last menstrual period 09/18/2012, SpO2 95 %.Body mass index is 39.26 kg/(m^2).  General Appearance: Casual   Eye Contact::  Fair  Speech:  Slow  Volume:  Decreased  Mood:  Dysphoric  Affect:  Labile  Thought Process:  Goal Directed  Orientation:  Full (Time, Place, and Person)  Thought Content:  WDL  Suicidal Thoughts:  No  Homicidal Thoughts:  No  Memory:  Immediate;   Fair Recent;   Fair Remote;   Fair  Judgement:  Impaired  Insight:  Shallow  Psychomotor Activity:  Decreased  Concentration:  Fair  Recall:  [place]: Fair  Akathisia:  No  Handed:  Right  AIMS (if indicated):     Assets:  Communication Skills Desire for Improvement Financial Resources/Insurance Housing Intimacy Physical Health Resilience Social Support  ADL's:  Intact  Cognition: WNL  Sleep:  Number of Hours: 7.25     Current Medications: Current Facility-Administered Medications  Medication Dose Route Frequency Provider Last Rate Last Dose  . acetaminophen (TYLENOL) tablet 650 mg  650 mg Oral Q6H PRN Jolanta B Pucilowska, MD      . alum & mag hydroxide-simeth (MAALOX/MYLANTA) 200-200-20 MG/5ML suspension 30 mL  30 mL Oral Q4H PRN Jolanta B Pucilowska, MD      . amLODipine (NORVASC) tablet 10 mg  10 mg Oral Daily Jolanta B Pucilowska, MD   10 mg at 09/07/15 1022  . benztropine (COGENTIN) tablet 0.5 mg  0.5 mg Oral BID Jolanta B Pucilowska, MD   0.5 mg at 09/07/15 1022  . clonazePAM (KLONOPIN) tablet 0.5 mg  0.5 mg Oral BID Clovis Fredrickson, MD   0.5 mg at 09/06/15 1555  . divalproex (DEPAKOTE SPRINKLE) capsule 500 mg  500 mg Oral 3 times per day Clovis Fredrickson, MD   500 mg at 09/07/15 0600  . feeding supplement (GLUCERNA SHAKE) (GLUCERNA SHAKE) liquid 237 mL  237 mL Oral TID BM Jolanta B Pucilowska, MD   237 mL at 09/06/15 1746  . hydrALAZINE (APRESOLINE) tablet 50 mg  50 mg Oral 3 times per day Clovis Fredrickson, MD   50 mg at 09/07/15 0645  . hydrochlorothiazide (HYDRODIURIL) tablet 25 mg  25 mg Oral Daily Jolanta B Pucilowska, MD   25 mg at 09/07/15 1022  .  levothyroxine (SYNTHROID, LEVOTHROID) tablet 125 mcg  125 mcg Oral QAC breakfast Clovis Fredrickson, MD   125 mcg at 09/07/15 0805  . LORazepam (ATIVAN) tablet 1 mg  1 mg Oral TID AC & HS Jolanta B Pucilowska, MD   1 mg at 09/06/15 1745  . magnesium hydroxide (MILK OF MAGNESIA) suspension 30 mL  30 mL Oral Daily PRN Jolanta B Pucilowska, MD      . metFORMIN (GLUCOPHAGE-XR) 24 hr tablet 500 mg  500 mg Oral Q breakfast Jolanta B Pucilowska, MD   500 mg at 09/07/15 0813  . metoprolol succinate (TOPROL-XL) 24 hr tablet 50 mg  50 mg Oral Daily Jolanta B Pucilowska, MD   50 mg at 09/07/15 1021  . risperiDONE (RISPERDAL M-TABS)  disintegrating tablet 3 mg  3 mg Oral BID Clovis Fredrickson, MD   3 mg at 09/07/15 1022  . traZODone (DESYREL) tablet 100 mg  100 mg Oral QHS Jolanta B Pucilowska, MD   100 mg at 09/06/15 2200  . zolpidem (AMBIEN) tablet 5 mg  5 mg Oral QHS PRN Clovis Fredrickson, MD        Lab Results:  Results for orders placed or performed during the hospital encounter of 09/06/15 (from the past 48 hour(s))  Glucose, capillary     Status: Abnormal   Collection Time: 09/06/15  5:06 PM  Result Value Ref Range   Glucose-Capillary 113 (H) 65 - 99 mg/dL  Glucose, capillary     Status: Abnormal   Collection Time: 09/07/15  6:58 AM  Result Value Ref Range   Glucose-Capillary 134 (H) 65 - 99 mg/dL   Comment 1 Notify RN     Physical Findings: AIMS:  , ,  ,  ,    CIWA:    COWS:     Treatment Plan Summary: Daily contact with patient to assess and evaluate symptoms and progress in treatment and Medication management   Medical Decision Making:  Established Problem, Stable/Improving (1), Review of Psycho-Social Stressors (1), Review or order clinical lab tests (1), Review of Medication Regimen & Side Effects (2) and Review of New Medication or Change in Dosage (2)   Ms. Fontanella is a 44 year old female with a history of schizophrenia admitted for disorganized psychotic, possibly manic  behavior in the context of medication noncompliance.  1. Psychosis. We restarted Risperdal and gave 234 mg of offered [place] sustenna injection to improve compliance. We started Depakote for mood stabilization and Ativan for restlessness. Will lower Ativan dose to 0.5 mg prn.  2. Hypertension. We will continue Norvasc, hydralazine and hydrochlorothiazide.  3. Diabetes. Metformin with blood glucose monitoring and diabetic diet.  4. Anxiety. We'll continue low-dose Klonopin.  5. Thyroid disease. We'll continue Synthroid.  6. Insomnia. We will offer trazodone tonight. She has available if needed   7. Poor oral intake. We will offer Glucerna.   8. Disposition. She will return home with her husband. She will follow up with her regular psychiatrist      Jolanta Pucilowska 09/07/2015, 1:41 PM

## 2015-09-08 LAB — GLUCOSE, CAPILLARY
GLUCOSE-CAPILLARY: 127 mg/dL — AB (ref 65–99)
GLUCOSE-CAPILLARY: 111 mg/dL — AB (ref 65–99)

## 2015-09-08 MED ORDER — TRAZODONE HCL 50 MG PO TABS
ORAL_TABLET | ORAL | Status: AC
  Administered 2015-09-08: 22:00:00
  Filled 2015-09-08: qty 2

## 2015-09-08 MED ORDER — INFLUENZA VAC SPLIT QUAD 0.5 ML IM SUSY
0.5000 mL | PREFILLED_SYRINGE | INTRAMUSCULAR | Status: AC
  Administered 2015-09-11: 0.5 mL via INTRAMUSCULAR
  Filled 2015-09-08: qty 0.5

## 2015-09-08 NOTE — Plan of Care (Signed)
Problem: Ineffective individual coping Goal: STG-Increase in ability to manage activities of daily living Outcome: Not Progressing Unable to stay focused, sleepy
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
## 2015-09-08 NOTE — Progress Notes (Signed)
D: Patient continue to voice of being  very tired in the am  Hours . No ADL's this am  Limited interaction  With her peers , patient returned to room following breakfast  To rest . Remains to have some difficulty with swallowing medications. Affect flat  and depressed  Mood. A: instructed patient on medication, verbalizing  Understanding , encourage patient  Participation with unit programing  R: Receptive to information received , voice no other concerns

## 2015-09-08 NOTE — Progress Notes (Signed)
[facility] MD Progress Note  09/08/2015 10:51 AM [name]  MRN:  [id number]  Subjective:  [name] is a 44 year old female with history of schizoaffective disorder admitted for a manic episode in the context of treatment noncompliance. [name] was restarted on Risperdal and Depakote. [name] was given [place] sustenna injection on 9/8 to improve compliance. [name] was restless and insomniac on admission. Patient reports doing well on the current medications. [name] denies any side effects. [name] states [name] is sleeping well. States her appetite has improved. [name] states that [name] was pacing but now that [name]'s been in the hospital and in getting medication that has stopped. He denies any physical aches or pains.   Principal Problem: Paranoid schizophrenia Diagnosis:   Patient Active Problem List   Diagnosis Date Noted  . Paranoid schizophrenia [F20.0] 07/20/2014  . 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 Contact dermatitis [L25.9] 01/18/2013  . Vaginitis [N76.0] 01/18/2013  . Essential hypertension, benign [I10] 12/24/2012  . Heartburn [R12] 12/24/2012  . Hypothyroidism [E03.9] 12/24/2012  . 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 Acute DVT (deep venous thrombosis) [I82.409] 09/27/2012  . Chronic kidney disease [N18.9] 08/28/2012  . Fibroid uterus [D25.9] 06/12/2012   Total Time spent with patient: 20 minutes   Past Medical History:  Past Medical History  Diagnosis Date  . Thyroid disease   . Fibroid tumor   . DVT (deep venous thrombosis) 09/30/12    in upper extremities b/l and LLE  . Anemia   . Thyroid nodule     s/p biopsy  . Graves disease   . Multinodular goiter 10/01/2012  . Lupus anticoagulant positive 10/02/2012  . Hypertension   . 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 Arnoldo Morale on 12/13/2012.  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 Paranoid schizophrenia    . Delusional disorder     Past Surgical History  Procedure Laterality Date  . Supracervical abdominal hysterectomy  09/27/2012    Procedure: HYSTERECTOMY SUPRACERVICAL ABDOMINAL;  Surgeon: Jonnie Kind, MD;  Location: [facility];  Service: Gynecology;  Laterality: N/A;  . Hematoma evacuation  10/03/2012    Procedure: EVACUATION HEMATOMA;  Surgeon: Jonnie Kind, MD;  Location: [facility];  Service: Gynecology;  Laterality: N/A;  . Thyroidectomy  12/13/2012    Procedure: THYROIDECTOMY;  Surgeon: Jamesetta So, MD;  Location: [facility];  Service: General;  Laterality: Bilateral;  . Abdominal hysterectomy     Family History:  Family History  Problem Relation Age of Onset  . Diabetes Mother   . Hypertension Mother   . Cancer Father     prostate   . Cancer Sister     pancreatic cancer   Social History:  History  Alcohol Use No     History  Drug Use No    Social History   Social History  . Marital Status: Married    Spouse Name: N/A  . Number of Children: N/A  . Years of Education: N/A   Social History Main Topics  . Smoking status: Never Smoker   . Smokeless tobacco: Never Used  . Alcohol Use: No  . Drug Use: No  . Sexual Activity: Yes    Birth Control/ Protection: Surgical   Other Topics Concern  . None   Social History Narrative   Additional History:    Sleep: Good  Appetite:  Fair   Assessment:  Musculoskeletal: Strength & Muscle Tone: within normal limits Gait & Station: normal Patient leans: N/A   Psychiatric Specialty Exam: Physical Exam  Nursing note and vitals reviewed.   Review of Systems  Psychiatric/Behavioral: Negative for depression, suicidal ideas, hallucinations, memory loss and substance abuse. The patient is not nervous/anxious and does not have insomnia.   All other systems reviewed and are negative.   Blood pressure 118/81, pulse 111, temperature 98.2 F (36.8 C), temperature source Oral, resp. rate 18, height 5\' 9"  (1.753 m), weight  266 lb (120.657 kg), last menstrual period 09/18/2012, SpO2 98 %.Body mass index is 39.26 kg/(m^2).  General Appearance: Casual  Eye Contact::  Fair  Speech:  Slow  Volume:  Decreased  Mood:  Bright today  Affect:  Calm and appropriate   Thought Process:  Goal Directed  Orientation:  Full (Time, Place, and Person)  Thought Content:  WDL  Suicidal Thoughts:  No  Homicidal Thoughts:  No  Memory:  Immediate;   Fair Recent;   Fair Remote;   Fair  Judgement:  Impaired  Insight:  Shallow  Psychomotor Activity:  Decreased  Concentration:  Fair  Recall:  [place]: Fair  Akathisia:  No  Handed:  Right  AIMS (if indicated):     Assets:  Communication Skills Desire for Improvement Financial Resources/Insurance Housing Intimacy Physical Health Resilience Social Support  ADL's:  Intact  Cognition: WNL  Sleep:  Number of Hours: 3.75     Current Medications: Current Facility-Administered Medications  Medication Dose Route Frequency Provider Last Rate Last Dose  . acetaminophen (TYLENOL) tablet 650 mg  650 mg Oral Q6H PRN Jolanta B Pucilowska, MD      . alum & mag hydroxide-simeth (MAALOX/MYLANTA) 200-200-20 MG/5ML suspension 30 mL  30 mL Oral Q4H PRN Jolanta B Pucilowska, MD      . amLODipine (NORVASC) tablet 10 mg  10 mg Oral Daily Clovis Fredrickson, MD   10 mg at 09/08/15 0929  . benztropine (COGENTIN) tablet 0.5 mg  0.5 mg Oral BID Clovis Fredrickson, MD   0.5 mg at 09/08/15 0931  . clonazePAM (KLONOPIN) tablet 0.5 mg  0.5 mg Oral BID Clovis Fredrickson, MD   0.5 mg at 09/08/15 0931  . divalproex (DEPAKOTE SPRINKLE) capsule 500 mg  500 mg Oral 3 times per day Clovis Fredrickson, MD   500 mg at 09/08/15 0634  . feeding supplement (GLUCERNA SHAKE) (GLUCERNA SHAKE) liquid 237 mL  237 mL Oral TID BM Jolanta B Pucilowska, MD   237 mL at 09/08/15 1015  . hydrALAZINE (APRESOLINE) tablet 50 mg  50 mg Oral 3 times per day Clovis Fredrickson, MD    50 mg at 09/08/15 0641  . hydrochlorothiazide (HYDRODIURIL) tablet 25 mg  25 mg Oral Daily Clovis Fredrickson, MD   25 mg at 09/08/15 0929  . [START ON 09/09/2015] Influenza vac split quadrivalent PF (FLUARIX) injection 0.5 mL  0.5 mL Intramuscular Tomorrow-1000 Jolanta B Pucilowska, MD      . levothyroxine (SYNTHROID, LEVOTHROID) tablet 125 mcg  125 mcg Oral QAC breakfast Clovis Fredrickson, MD   125 mcg at 09/08/15 0636  . LORazepam (ATIVAN) tablet 0.5 mg  0.5 mg Oral Q6H PRN Jolanta B Pucilowska, MD      . magnesium hydroxide (MILK OF MAGNESIA) suspension 30 mL  30 mL Oral Daily PRN Jolanta B Pucilowska, MD      . metFORMIN (GLUCOPHAGE-XR) 24 hr tablet 500 mg  500 mg Oral Q breakfast Clovis Fredrickson, MD   500 mg at 09/08/15 0758  . metoprolol succinate (TOPROL-XL) 24 hr tablet 50 mg  50 mg Oral Daily Jolanta B Pucilowska, MD   50 mg at 09/08/15 0929  . risperiDONE (RISPERDAL M-TABS) disintegrating tablet 3 mg  3 mg Oral BID Clovis Fredrickson, MD   3 mg at 09/08/15 0928  . traZODone (DESYREL) tablet 100 mg  100 mg Oral QHS Jolanta B Pucilowska, MD   100 mg at 09/07/15 2137  . zolpidem (AMBIEN) tablet 5 mg  5 mg Oral QHS PRN Clovis Fredrickson, MD        Lab Results:  Results for orders placed or performed during the hospital encounter of 09/06/15 (from the past 48 hour(s))  Glucose, capillary     Status: Abnormal   Collection Time: 09/06/15  5:06 PM  Result Value Ref Range   Glucose-Capillary 113 (H) 65 - 99 mg/dL  Glucose, capillary     Status: Abnormal   Collection Time: 09/07/15  6:58 AM  Result Value Ref Range   Glucose-Capillary 134 (H) 65 - 99 mg/dL   Comment 1 Notify RN   Glucose, capillary     Status: Abnormal   Collection Time: 09/07/15  4:27 PM  Result Value Ref Range   Glucose-Capillary 109 (H) 65 - 99 mg/dL   Comment 1 Notify RN     Physical Findings: AIMS:  , ,  ,  ,    CIWA:    COWS:     Treatment Plan Summary: Daily contact with patient to assess and  evaluate symptoms and progress in treatment and Medication management   Medical Decision Making:  Established Problem, Stable/Improving (1), Review of Psycho-Social Stressors (1), Review or order clinical lab tests (1), Review of Medication Regimen & Side Effects (2) and Review of New Medication or Change in Dosage (2)   [name] is a 44 year old female with a history of schizophrenia admitted for disorganized psychotic, possibly manic behavior in the context of medication noncompliance.  1. Psychosis. We restarted Risperdal and gave 234 mg of offered [place] sustenna injection to improve compliance. We started Depakote for mood stabilization and Ativan for restlessness. Will lower Ativan dose to 0.5 mg prn. We will check a Depakote level tomorrow morning.  2. Hypertension. We will continue Norvasc, hydralazine and hydrochlorothiazide.  3. Diabetes. Metformin with blood glucose monitoring and diabetic diet.  4. Anxiety. We'll continue low-dose Klonopin.  5. Thyroid disease. We'll continue Synthroid.  6. Insomnia. We will offer trazodone tonight. [name] has available if needed   7. Poor oral intake. We will offer Glucerna.   8. Disposition. [name] will return home with her husband. [name] will follow up with her regular psychiatrist      Faith Rogue 09/08/2015, 10:51 AM

## 2015-09-08 NOTE — BHH Group Notes (Signed)
[place] Group Notes:  (Nursing/MHT/Case Management/Adjunct)  Date:  09/08/2015  Time:  12:14 PM  Type of Therapy:  Psychoeducational Skills  Participation Level:  Did Not Attend   Celso Amy 09/08/2015, 12:14 PM

## 2015-09-08 NOTE — BHH Group Notes (Signed)
[place] LCSW Group Therapy  09/08/2015 2:11 PM  Type of Therapy:  Group Therapy  Participation Level:  Active  Participation Quality:  Attentive  Affect:  Appropriate  Cognitive:  Alert  Insight:  Improving  Engagement in Therapy:  Improving  Modes of Intervention:  Discussion, Education, Socialization and Support  Summary of Progress/Problems:Pt will identify unhealthy thoughts and how they impact their emotions and behavior. Pt will be encouraged to discuss these thoughts, emotions and behaviors with the group. [name] attended group and stayed the entire time. She states she is feeling much better today and believes her medications are working well. She states "my mind is clearing up." During group she discussed feeling depressed. Also, she discussed her faith and how it comforts her.   [place] MSW, [place]  09/08/2015, 2:11 PM

## 2015-09-09 LAB — VALPROIC ACID LEVEL: Valproic Acid Lvl: 90 ug/mL (ref 50.0–100.0)

## 2015-09-09 LAB — GLUCOSE, CAPILLARY
Glucose-Capillary: 109 mg/dL — ABNORMAL HIGH (ref 65–99)
Glucose-Capillary: 88 mg/dL (ref 65–99)
Glucose-Capillary: 191 mg/dL — ABNORMAL HIGH (ref 65–99)

## 2015-09-09 MED ORDER — AQUAPHOR EX OINT: TOPICAL_OINTMENT | Freq: Two times a day (BID) | CUTANEOUS | Status: DC | PRN

## 2015-09-09 NOTE — BHH Group Notes (Signed)
[name]  09/09/2015 2:47 PM  Type of Therapy:  Group Therapy  Participation Level:  Minimal  Participation Quality:  Drowsy  Affect:  Flat  Cognitive:  Appropriate  Insight:  Improving  Engagement in Therapy:  Improving  Modes of Intervention:  Discussion, Education, Socialization and Support  Summary of Progress/Problems: Feelings around Relapse. Group members discussed the meaning of relapse and shared personal stories of relapse, how it affected them and others, and how they perceived themselves during this time. Group members were encouraged to identify triggers, warning signs and coping skills used when facing the possibility of relapse. Social supports were discussed and explored in detail. [name] attended group and stayed the entire time. She sat quietly and listened to other group members. She reports feeling better today but she is feeling drowsy. She believes she is ready to be discharged.   [place] MSW, [place]  09/09/2015, 2:47 PM

## 2015-09-09 NOTE — Progress Notes (Signed)
[name] Progress Note  09/09/2015 1:43 PM [name]  MRN:  [id number]  Subjective:  [name] is a 44 year old female with history of schizoaffective disorder admitted for a manic episode in the context of treatment noncompliance. Patient indicated she was feeling good. She denied any physical complaints. She stated she did feel a little bit sleepy. She is denying any side effects or medications. She has been eating her meals. She states she's been sleeping well.  Principal Problem: Paranoid schizophrenia Diagnosis:   Patient Active Problem List   Diagnosis Date Noted  . Paranoid schizophrenia [F20.0] 07/20/2014  . 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 Contact dermatitis [L25.9] 01/18/2013  . Vaginitis [N76.0] 01/18/2013  . Essential hypertension, benign [I10] 12/24/2012  . Heartburn [R12] 12/24/2012  . Hypothyroidism [E03.9] 12/24/2012  . 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 Acute DVT (deep venous thrombosis) [I82.409] 09/27/2012  . Chronic kidney disease [N18.9] 08/28/2012  . Fibroid uterus [D25.9] 06/12/2012   Total Time spent with patient: 20 minutes   Past Medical History:  Past Medical History  Diagnosis Date  . Thyroid disease   . Fibroid tumor   . DVT (deep venous thrombosis) 09/30/12    in upper extremities b/l and LLE  . Anemia   . Thyroid nodule     s/p biopsy  . Graves disease   . Multinodular goiter 10/01/2012  . Lupus anticoagulant positive 10/02/2012  . Hypertension   . 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 Arnoldo Morale on 12/13/2012.  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 Paranoid schizophrenia   . Delusional disorder     Past Surgical History  Procedure Laterality Date  . Supracervical abdominal hysterectomy  09/27/2012    Procedure: HYSTERECTOMY SUPRACERVICAL ABDOMINAL;  Surgeon: Jonnie Kind, Conner;  Location: [facility];  Service: Gynecology;  Laterality: N/A;  . Hematoma evacuation  10/03/2012    Procedure: EVACUATION HEMATOMA;  Surgeon: Jonnie Kind, Conner;  Location: [facility];  Service: Gynecology;  Laterality: N/A;  . Thyroidectomy  12/13/2012    Procedure: THYROIDECTOMY;  Surgeon: Jamesetta So, Conner;  Location: [facility];  Service: General;  Laterality: Bilateral;  . Abdominal hysterectomy     Family History:  Family History  Problem Relation Age of Onset  . Diabetes Mother   . Hypertension Mother   . Cancer Father     prostate   . Cancer Sister     pancreatic cancer   Social History:  History  Alcohol Use No     History  Drug Use No    Social History   Social History  . Marital Status: Married    Spouse Name: N/A  . Number of Children: N/A  . Years of Education: N/A   Social History Main Topics  . Smoking status: Never Smoker   . Smokeless tobacco: Never Used  . Alcohol Use: No  . Drug Use: No  . Sexual Activity: Yes    Birth Control/ Protection: Surgical   Other Topics Concern  . None   Social History Narrative   Additional History:    Sleep: Good  Appetite:  Fair   Assessment:   Musculoskeletal: Strength & Muscle Tone: within normal limits Gait & Station: normal Patient leans: N/A   Psychiatric Specialty Exam: Physical Exam  Nursing note and vitals reviewed.   Review of Systems  Psychiatric/Behavioral: Negative for  depression, suicidal ideas, hallucinations, memory loss and substance abuse. The patient is not nervous/anxious and does not have insomnia.   All other systems reviewed and are negative.   Blood pressure 116/81, pulse 109, temperature 98.2 F (36.8 C), temperature source Oral, resp. rate 18, height 5\' 9"  (1.753 m), weight 266 lb (120.657 kg), last menstrual period 09/18/2012, SpO2 98 %.Body mass index is 39.26 kg/(m^2).  General Appearance: Casual  Eye Contact::  Fair  Speech:  Slow  Volume:  Decreased  Mood:  Bright today  Affect:  Calm and  appropriate   Thought Process:  Goal Directed  Orientation:  Full (Time, Place, and Person)  Thought Content:  WDL  Suicidal Thoughts:  No  Homicidal Thoughts:  No  Memory:  Immediate;   Fair Recent;   Fair Remote;   Fair  Judgement:  Impaired  Insight:  Shallow  Psychomotor Activity:  Decreased  Concentration:  Fair  Recall:  [name]: Fair  Akathisia:  No  Handed:  Right  AIMS (if indicated):     Assets:  Communication Skills Desire for Improvement Financial Resources/Insurance Housing Intimacy Physical Health Resilience Social Support  ADL's:  Intact  Cognition: WNL  Sleep:  Number of Hours: 8     Current Medications: Current Facility-Administered Medications  Medication Dose Route Frequency Provider Last Rate Last Dose  . acetaminophen (TYLENOL) tablet 650 mg  650 mg Oral Q6H PRN Jolanta B Pucilowska, Conner      . alum & mag hydroxide-simeth (MAALOX/MYLANTA) 200-200-20 MG/5ML suspension 30 mL  30 mL Oral Q4H PRN Jolanta B Pucilowska, Conner      . amLODipine (NORVASC) tablet 10 mg  10 mg Oral Daily Jolanta B Pucilowska, Conner   10 mg at 09/09/15 1030  . benztropine (COGENTIN) tablet 0.5 mg  0.5 mg Oral BID Jolanta B Pucilowska, Conner   0.5 mg at 09/09/15 1026  . clonazePAM (KLONOPIN) tablet 0.5 mg  0.5 mg Oral BID Jolanta B Pucilowska, Conner   0.5 mg at 09/09/15 1026  . divalproex (DEPAKOTE SPRINKLE) capsule 500 mg  500 mg Oral 3 times per day Clovis Fredrickson, Conner   500 mg at 09/09/15 8416  . feeding supplement (GLUCERNA SHAKE) (GLUCERNA SHAKE) liquid 237 mL  237 mL Oral TID BM Jolanta B Pucilowska, Conner   237 mL at 09/09/15 1000  . hydrALAZINE (APRESOLINE) tablet 50 mg  50 mg Oral 3 times per day Clovis Fredrickson, Conner   50 mg at 09/09/15 6063  . hydrochlorothiazide (HYDRODIURIL) tablet 25 mg  25 mg Oral Daily Jolanta B Pucilowska, Conner   25 mg at 09/09/15 1026  . Influenza vac split quadrivalent PF (FLUARIX) injection 0.5 mL  0.5 mL Intramuscular  Tomorrow-1000 Jolanta B Pucilowska, Conner   0.5 mL at 09/09/15 1000  . levothyroxine (SYNTHROID, LEVOTHROID) tablet 125 mcg  125 mcg Oral QAC breakfast Clovis Fredrickson, Conner   125 mcg at 09/09/15 0160  . LORazepam (ATIVAN) tablet 0.5 mg  0.5 mg Oral Q6H PRN Jolanta B Pucilowska, Conner      . magnesium hydroxide (MILK OF MAGNESIA) suspension 30 mL  30 mL Oral Daily PRN Jolanta B Pucilowska, Conner      . metFORMIN (GLUCOPHAGE-XR) 24 hr tablet 500 mg  500 mg Oral Q breakfast Clovis Fredrickson, Conner   500 mg at 09/09/15 0808  . metoprolol succinate (TOPROL-XL) 24 hr tablet 50 mg  50 mg Oral Daily Clovis Fredrickson, Conner  50 mg at 09/09/15 1025  . risperiDONE (RISPERDAL M-TABS) disintegrating tablet 3 mg  3 mg Oral BID Clovis Fredrickson, Conner   3 mg at 09/09/15 1025  . traZODone (DESYREL) tablet 100 mg  100 mg Oral QHS Jolanta B Pucilowska, Conner   100 mg at 09/08/15 2200  . zolpidem (AMBIEN) tablet 5 mg  5 mg Oral QHS PRN Clovis Fredrickson, Conner        Lab Results:  Results for orders placed or performed during the hospital encounter of 09/06/15 (from the past 48 hour(s))  Glucose, capillary     Status: Abnormal   Collection Time: 09/07/15  4:27 PM  Result Value Ref Range   Glucose-Capillary 109 (H) 65 - 99 mg/dL   Comment 1 Notify RN   Glucose, capillary     Status: Abnormal   Collection Time: 09/08/15  6:46 AM  Result Value Ref Range   Glucose-Capillary 127 (H) 65 - 99 mg/dL  Glucose, capillary     Status: Abnormal   Collection Time: 09/08/15  8:33 PM  Result Value Ref Range   Glucose-Capillary 111 (H) 65 - 99 mg/dL  Valproic acid level     Status: None   Collection Time: 09/09/15  6:19 AM  Result Value Ref Range   Valproic Acid Lvl 90 50.0 - 100.0 ug/mL  Glucose, capillary     Status: Abnormal   Collection Time: 09/09/15 12:12 PM  Result Value Ref Range   Glucose-Capillary 109 (H) 65 - 99 mg/dL    Physical Findings: AIMS:  , ,  ,  ,    CIWA:    COWS:     Treatment Plan  Summary: Daily contact with patient to assess and evaluate symptoms and progress in treatment and Medication management   Medical Decision Making:  Established Problem, Stable/Improving (1), Review of Psycho-Social Stressors (1), Review or order clinical lab tests (1), Review of Medication Regimen & Side Effects (2) and Review of New Medication or Change in Dosage (2)   Ms. Shippee is a 44 year old female with a history of schizophrenia admitted for disorganized psychotic, possibly manic behavior in the context of medication noncompliance.  1. Psychosis. We restarted Risperdal and gave 234 mg of offered [place] sustenna injection to improve compliance. We started Depakote for mood stabilization and Ativan for restlessness. Will lower Ativan dose to 0.5 mg prn. We will check a Depakote level tomorrow morning.  2. Hypertension. We will continue Norvasc, hydralazine and hydrochlorothiazide.  3. Diabetes. Metformin with blood glucose monitoring and diabetic diet.  4. Anxiety. We'll continue low-dose Klonopin.  5. Thyroid disease. We'll continue Synthroid.  6. Insomnia. We will offer trazodone tonight. She has available if needed   7. Poor oral intake. We will offer Glucerna.   8. Disposition. She will return home with her husband. She will follow up with her regular psychiatrist      Faith Rogue 09/09/2015, 1:43 PM

## 2015-09-09 NOTE — Progress Notes (Signed)
Resting in room at onset of shift. Was medication compliant. Denied AVH, SI, HI. Had an uneventful night.

## 2015-09-09 NOTE — Plan of Care (Signed)
Problem: Ineffective individual coping Goal: STG-Increase in ability to manage activities of daily living Outcome: Not Progressing Stayed in bed & states "I am tired & sleeping.'

## 2015-09-09 NOTE — Progress Notes (Signed)
Calm & hypoactive during this shift.Denies suicidal ideation & AV hallucination.With encouragement she changed her cloths but she did not take a shower.Complaint with medications & groups.Passive interaction with peers.

## 2015-09-09 NOTE — Plan of Care (Signed)
Problem: Diagnosis: Increased Risk For Suicide Attempt Goal: LTG-Patient Will Report Improved Mood and Deny Suicidal LTG (by discharge) Patient will report improved mood and deny suicidal ideation.  Outcome: Progressing Patient denies suicidal ideation.     

## 2015-09-10 LAB — AMMONIA: AMMONIA: 10 umol/L (ref 9–35)

## 2015-09-10 LAB — GLUCOSE, CAPILLARY: Glucose-Capillary: 123 mg/dL — ABNORMAL HIGH (ref 65–99)

## 2015-09-10 LAB — VALPROIC ACID LEVEL: VALPROIC ACID LVL: 107 ug/mL — AB (ref 50.0–100.0)

## 2015-09-10 MED ORDER — PALIPERIDONE PALMITATE 234 MG/1.5ML IM SUSP: 234.0000 mg | INTRAMUSCULAR | Status: DC

## 2015-09-10 MED ORDER — ZOLPIDEM TARTRATE 5 MG PO TABS: 5.0000 mg | ORAL_TABLET | Freq: Every evening | ORAL | Status: DC | PRN

## 2015-09-10 MED ORDER — TRAZODONE HCL 100 MG PO TABS: 100.0000 mg | ORAL_TABLET | Freq: Every day | ORAL | Status: DC

## 2015-09-10 MED ORDER — DIVALPROEX SODIUM 125 MG PO CSDR: 1000.0000 mg | DELAYED_RELEASE_CAPSULE | Freq: Every day | ORAL | Status: DC

## 2015-09-10 MED ORDER — BENZTROPINE MESYLATE 0.5 MG PO TABS: 0.5000 mg | ORAL_TABLET | Freq: Two times a day (BID) | ORAL | Status: DC

## 2015-09-10 MED ORDER — RISPERIDONE 3 MG PO TBDP
3.0000 mg | ORAL_TABLET | Freq: Two times a day (BID) | ORAL | Status: DC
Start: 2015-09-10 — End: 2015-09-11

## 2015-09-10 MED ORDER — DIVALPROEX SODIUM 125 MG PO CSDR: 1000.0000 mg | DELAYED_RELEASE_CAPSULE | Freq: Every day | ORAL | Status: AC

## 2015-09-10 MED ORDER — PALIPERIDONE PALMITATE 156 MG/ML IM SUSP
156.0000 mg | Freq: Once | INTRAMUSCULAR | Status: AC
  Administered 2015-09-10: 156 mg via INTRAMUSCULAR
  Filled 2015-09-10: qty 1

## 2015-09-10 MED ORDER — RISPERIDONE 1 MG PO TBDP: 3.0000 mg | ORAL_TABLET | Freq: Every day | ORAL | Status: DC

## 2015-09-10 NOTE — Plan of Care (Signed)
Problem: Ineffective individual coping Goal: LTG: Patient will report a decrease in negative feelings Outcome: Progressing Pt stated she felt better  Problem: Alteration in mood Goal: LTG-Patient reports reduction in suicidal thoughts (Patient reports reduction in suicidal thoughts and is able to verbalize a safety plan for whenever patient is feeling suicidal)  Outcome: Progressing Pt denies SI at this time

## 2015-09-10 NOTE — Progress Notes (Signed)
Patient was so sleepy this morning.Denies suicidal ideation.Attended groups.Compliant with meds.Amonia level checked & is WNL.Appetite fair.

## 2015-09-10 NOTE — BHH Group Notes (Signed)
[place] Group Notes:  (Nursing/MHT/Case Management/Adjunct)  Date:  09/10/2015  Time:  1:56 PM  Type of Therapy:  Psychoeducational Skills  Participation Level: left early   Celso Amy 09/10/2015, 1:56 PM

## 2015-09-10 NOTE — Progress Notes (Signed)
Recreation Therapy Notes  Date: 09.12.16 Time: 3:00 pm Location: Craft Room  Group Topic: Self-expression  Goal Area(s) Addresses:  Patient will identify one color per emotion listed on wheel. Patient will verbalize benefit of using art as a means of self-expression. Patient will verbalize one emotion experienced during session. Patient will be educated on other forms of self-expression.  Behavioral Response: Attentive, Interactive  Intervention: Emotion Wheel  Activity: Patients were given a worksheet with 7 different emotions and were instructed to pick a color for each emotion.  Education: LRT educated patient on different forms of self-expression.   Education Outcome: In group clarification offered  Clinical Observations/Feedback: Patient picked different colors for 6 emotions. Patient contributed to group discussion by stating what colors she picked for certain emotions.   Leonette Monarch, LRT/CTRS 09/10/2015 4:42 PM

## 2015-09-10 NOTE — [facility] Suicide Risk Assessment (Signed)
[facility] Discharge Suicide Risk Assessment   Demographic Factors:  Unemployed  Total Time spent with patient: 30 minutes  Musculoskeletal: Strength & Muscle Tone: within normal limits Gait & Station: normal Patient leans: N/A  Psychiatric Specialty Exam: Physical Exam  Nursing note and vitals reviewed. Eyes: Scleral icterus is present.    Review of Systems  HENT:       Difficulties swallowing.  All other systems reviewed and are negative.   Blood pressure 136/92, pulse 110, temperature 98.2 F (36.8 C), temperature source Oral, resp. rate 14, height 5\' 9"  (1.753 m), weight 120.657 kg (266 lb), last menstrual period 09/18/2012, SpO2 98 %.Body mass index is 39.26 kg/(m^2).  General Appearance: Casual  Eye Contact::  Good  Speech:  Clear and [id number]  Volume:  Normal  Mood:  Euthymic  Affect:  Appropriate  Thought Process:  Goal Directed  Orientation:  Full (Time, Place, and Person)  Thought Content:  WDL  Suicidal Thoughts:  No  Homicidal Thoughts:  No  Memory:  Immediate;   Fair Recent;   Fair Remote;   Fair  Judgement:  Fair  Insight:  Fair  Psychomotor Activity:  Normal  Concentration:  Fair  Recall:  [employer] [place]  Language: Fair  Akathisia:  No  Handed:  Right  AIMS (if indicated):     Assets:  Communication Skills Desire for Improvement Financial Resources/Insurance Housing Intimacy Physical Health Resilience Social Support  Sleep:  Number of Hours: 6  Cognition: WNL  ADL's:  Intact   Have you used any form of tobacco in the last 30 days? (Cigarettes, Smokeless Tobacco, Cigars, and/or Pipes): No  Has this patient used any form of tobacco in the last 30 days? (Cigarettes, Smokeless Tobacco, Cigars, and/or Pipes) No  Mental Status Per Nursing Assessment::   On Admission:     Current Mental Status by Physician: NA  Loss Factors: NA  Historical Factors: NA  Risk Reduction Factors:   Sense of responsibility to family, Living  with another person, especially a relative and Positive social support  Continued Clinical Symptoms:  Bipolar Disorder:   Mixed State  Cognitive Features That Contribute To Risk:  None    Suicide Risk:  Minimal: No identifiable suicidal ideation.  Patients presenting with no risk factors but with morbid ruminations; may be classified as minimal risk based on the severity of the depressive symptoms  Principal Problem: Paranoid schizophrenia Discharge Diagnoses:  Patient Active Problem List   Diagnosis Date Noted  . Paranoid schizophrenia [F20.0] 07/20/2014  . 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 Contact dermatitis [L25.9] 01/18/2013  . Vaginitis [N76.0] 01/18/2013  . Essential hypertension, benign [I10] 12/24/2012  . Heartburn [R12] 12/24/2012  . Hypothyroidism [E03.9] 12/24/2012  . 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 Acute DVT (deep venous thrombosis) [I82.409] 09/27/2012  . Chronic kidney disease [N18.9] 08/28/2012  . Fibroid uterus [D25.9] 06/12/2012      Plan Of Care/Follow-up recommendations:  Activity:  aas tolerated. Diet:  low sodium heart healtghy. Other:  keep follow up appointments.   Is patient on multiple antipsychotic therapies at discharge:  No   Has Patient had three or more failed trials of antipsychotic monotherapy by history:  No  Recommended Plan for Multiple Antipsychotic Therapies: NA    Rashay Barnette 09/10/2015, 12:52 PM

## 2015-09-10 NOTE — Progress Notes (Signed)
D: Pt denies SI/HI/AVH. Pt is pleasant and cooperative. Pt stated she was sleepy and was sleeping majority of day. Pt stated she felt "a lot better".   A: Pt was offered support and encouragement. Pt was given scheduled medications. Pt was encourage to attend groups. Q 15 minute checks were done for safety.    R:Pt attends groups and interacts well with peers and staff. Pt is taking medication. Pt has no complaints at this time .Pt receptive to treatment and safety maintained on unit.

## 2015-09-10 NOTE — Progress Notes (Signed)
[facility] MD Progress Note  09/10/2015 1:47 PM [name]  MRN:  [id number]  Subjective:  [name] feels very sedated today. She can hardly open her eyes. She fell asleep over lunch. Her Depakote level today is 107. It is likely that her ammonia is elevated we will check it now. She denies symptoms of depression, anxiety or psychosis. She is really not able to participate in the interview.  Principal Problem: Paranoid schizophrenia Diagnosis:   Patient Active Problem List   Diagnosis Date Noted  . Paranoid schizophrenia [F20.0] 07/20/2014  . 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 Contact dermatitis [L25.9] 01/18/2013  . Vaginitis [N76.0] 01/18/2013  . Essential hypertension, benign [I10] 12/24/2012  . Heartburn [R12] 12/24/2012  . Hypothyroidism [E03.9] 12/24/2012  . 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 Acute DVT (deep venous thrombosis) [I82.409] 09/27/2012  . Chronic kidney disease [N18.9] 08/28/2012  . Fibroid uterus [D25.9] 06/12/2012   Total Time spent with patient: 20 minutes   Past Medical History:  Past Medical History  Diagnosis Date  . Thyroid disease   . Fibroid tumor   . DVT (deep venous thrombosis) 09/30/12    in upper extremities b/l and LLE  . Anemia   . Thyroid nodule     s/p biopsy  . Graves disease   . Multinodular goiter 10/01/2012  . Lupus anticoagulant positive 10/02/2012  . Hypertension   . 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 Arnoldo Morale on 12/13/2012.  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 Paranoid schizophrenia   . Delusional disorder     Past Surgical History  Procedure Laterality Date  . Supracervical abdominal hysterectomy  09/27/2012    Procedure: HYSTERECTOMY SUPRACERVICAL ABDOMINAL;  Surgeon: Jonnie Kind, MD;  Location: [facility];  Service: Gynecology;  Laterality: N/A;  . Hematoma evacuation  10/03/2012   Procedure: EVACUATION HEMATOMA;  Surgeon: Jonnie Kind, MD;  Location: [facility];  Service: Gynecology;  Laterality: N/A;  . Thyroidectomy  12/13/2012    Procedure: THYROIDECTOMY;  Surgeon: Jamesetta So, MD;  Location: [facility];  Service: General;  Laterality: Bilateral;  . Abdominal hysterectomy     Family History:  Family History  Problem Relation Age of Onset  . Diabetes Mother   . Hypertension Mother   . Cancer Father     prostate   . Cancer Sister     pancreatic cancer   Social History:  History  Alcohol Use No     History  Drug Use No    Social History   Social History  . Marital Status: Married    Spouse Name: N/A  . Number of Children: N/A  . Years of Education: N/A   Social History Main Topics  . Smoking status: Never Smoker   . Smokeless tobacco: Never Used  . Alcohol Use: No  . Drug Use: No  . Sexual Activity: Yes    Birth Control/ Protection: Surgical   Other Topics Concern  . None   Social History Narrative   Additional History:    Sleep: Good  Appetite:  Good   Assessment:   Musculoskeletal: Strength & Muscle Tone: within normal limits Gait & Station: normal Patient leans: N/A   Psychiatric Specialty Exam: Physical Exam  Nursing note and vitals reviewed.   Review of Systems  Neurological: Positive for speech change.  All other systems reviewed and are negative.  Blood pressure 136/92, pulse 110, temperature 98.2 F (36.8 C), temperature source Oral, resp. rate 14, height 5\' 9"  (1.753 m), weight 120.657 kg (266 lb), last menstrual period 09/18/2012, SpO2 98 %.Body mass index is 39.26 kg/(m^2).  General Appearance: Disheveled  Eye Contact::  Minimal  Speech:  Slurred  Volume:  Decreased  Mood:  Depressed  Affect:  Flat  Thought Process:  Goal Directed  Orientation:  Full (Time, Place, and Person)  Thought Content:  WDL  Suicidal Thoughts:  No  Homicidal Thoughts:  No  Memory:  Immediate;   Fair Recent;   Fair Remote;   Fair   Judgement:  Fair  Insight:  Fair  Psychomotor Activity:  Psychomotor Retardation  Concentration:  Poor  Recall:  Poor  Fund of Knowledge:Fair  Language: Fair  Akathisia:  No  Handed:  Right  AIMS (if indicated):     Assets:  Communication Skills Desire for Improvement Financial Resources/Insurance Housing Intimacy Physical Health Resilience Social Support  ADL's:  Intact  Cognition: WNL  Sleep:  Number of Hours: 6     Current Medications: Current Facility-Administered Medications  Medication Dose Route Frequency Provider Last Rate Last Dose  . acetaminophen (TYLENOL) tablet 650 mg  650 mg Oral Q6H PRN Naziya Hegwood B Anadalay Macdonell, MD      . alum & mag hydroxide-simeth (MAALOX/MYLANTA) 200-200-20 MG/5ML suspension 30 mL  30 mL Oral Q4H PRN Annsleigh Dragoo B Creedon Danielski, MD      . amLODipine (NORVASC) tablet 10 mg  10 mg Oral Daily Relda Agosto B Yan Okray, MD   10 mg at 09/10/15 1052  . benztropine (COGENTIN) tablet 0.5 mg  0.5 mg Oral BID Zacory Fiola B [place] Flett, MD   0.5 mg at 09/10/15 1053  . clonazePAM (KLONOPIN) tablet 0.5 mg  0.5 mg Oral BID Kamila Broda B Jaimie Pippins, MD   0.5 mg at 09/10/15 1052  . [START ON 09/11/2015] divalproex (DEPAKOTE SPRINKLE) capsule 1,000 mg  1,000 mg Oral QHS Parke Jandreau B Kimiyo Carmicheal, MD      . feeding supplement (GLUCERNA SHAKE) (GLUCERNA SHAKE) liquid 237 mL  237 mL Oral TID BM Ashonti Leandro B Alsace Dowd, MD   237 mL at 09/10/15 1130  . hydrALAZINE (APRESOLINE) tablet 50 mg  50 mg Oral 3 times per day Clovis Fredrickson, MD   50 mg at 09/10/15 1325  . hydrochlorothiazide (HYDRODIURIL) tablet 25 mg  25 mg Oral Daily Jotham Ahn B Luna Audia, MD   25 mg at 09/10/15 1052  . Influenza vac split quadrivalent PF (FLUARIX) injection 0.5 mL  0.5 mL Intramuscular Tomorrow-1000 Mckenley Birenbaum B Atlee Kluth, MD   0.5 mL at 09/09/15 1000  . levothyroxine (SYNTHROID, LEVOTHROID) tablet 125 mcg  125 mcg Oral QAC breakfast Clovis Fredrickson, MD   125 mcg at 09/10/15 0702  . LORazepam (ATIVAN) tablet 0.5  mg  0.5 mg Oral Q6H PRN Janus Vlcek B Malike Foglio, MD      . magnesium hydroxide (MILK OF MAGNESIA) suspension 30 mL  30 mL Oral Daily PRN Laylani Pudwill B Shontelle Muska, MD      . metFORMIN (GLUCOPHAGE-XR) 24 hr tablet 500 mg  500 mg Oral Q breakfast Trust Crago B Ryeleigh Santore, MD   500 mg at 09/10/15 0811  . metoprolol succinate (TOPROL-XL) 24 hr tablet 50 mg  50 mg Oral Daily Terrica Duecker B Franshesca Chipman, MD   50 mg at 09/10/15 1056  . [START ON 10/08/2015] paliperidone (INVEGA SUSTENNA) injection 234 mg  234 mg Intramuscular Q28 days Dehlia Kilner B Raymond Bhardwaj, MD      . risperiDONE (RISPERDAL M-TABS) disintegrating  tablet 3 mg  3 mg Oral BID Clovis Fredrickson, MD   3 mg at 09/10/15 1052  . traZODone (DESYREL) tablet 100 mg  100 mg Oral QHS Naveen Clardy B Desirae Mancusi, MD   100 mg at 09/09/15 2205  . zolpidem (AMBIEN) tablet 5 mg  5 mg Oral QHS PRN Clovis Fredrickson, MD        Lab Results:  Results for orders placed or performed during the hospital encounter of 09/06/15 (from the past 48 hour(s))  Glucose, capillary     Status: Abnormal   Collection Time: 09/08/15  8:33 PM  Result Value Ref Range   Glucose-Capillary 111 (H) 65 - 99 mg/dL  Valproic acid level     Status: None   Collection Time: 09/09/15  6:19 AM  Result Value Ref Range   Valproic Acid Lvl 90 50.0 - 100.0 ug/mL  Glucose, capillary     Status: Abnormal   Collection Time: 09/09/15 12:12 PM  Result Value Ref Range   Glucose-Capillary 109 (H) 65 - 99 mg/dL  Glucose, capillary     Status: None   Collection Time: 09/09/15  4:42 PM  Result Value Ref Range   Glucose-Capillary 88 65 - 99 mg/dL   Comment 1 Notify RN   Glucose, capillary     Status: Abnormal   Collection Time: 09/09/15  8:29 PM  Result Value Ref Range   Glucose-Capillary 191 (H) 65 - 99 mg/dL  Valproic acid level     Status: Abnormal   Collection Time: 09/10/15  6:53 AM  Result Value Ref Range   Valproic Acid Lvl 107 (H) 50.0 - 100.0 ug/mL  Glucose, capillary     Status: Abnormal    Collection Time: 09/10/15  7:46 AM  Result Value Ref Range   Glucose-Capillary 123 (H) 65 - 99 mg/dL    Physical Findings: AIMS:  , ,  ,  ,    CIWA:    COWS:     Treatment Plan Summary: Daily contact with patient to assess and evaluate symptoms and progress in treatment and Medication management   Medical Decision Making:  Established Problem, Stable/Improving (1), New problem, with additional work up planned, Review of Psycho-Social Stressors (1), Review or order clinical lab tests (1), Review of Medication Regimen & Side Effects (2) and Review of New Medication or Change in Dosage (2)   [name] is a 44 year old female with a history of schizophrenia admitted for disorganized psychotic, possibly manic behavior in the context of medication noncompliance.  1. Psychosis. We restarted Risperdal and gave 234 mg of offered Invega sustenna injection to improve compliance. She will receive second injection today. We started Depakote for mood stabilization. VPA level 107. Will hold Depakote for now and check ammonia level.    2. Hypertension. We will continue Norvasc, hydralazine and hydrochlorothiazide.  3. Diabetes. Metformin with blood glucose monitoring and diabetic diet.  4. Anxiety. We'll hold low-dose Klonopin.  5. Thyroid disease. We'll continue Synthroid.  6. Insomnia. We will offer trazodone tonight. She has available if needed   7. Poor oral intake. We will offer Glucerna.   8. Disposition. She will return home with her husband. She will follow up with her regular psychiatrist         Zymiere Trostle 09/10/2015, 1:47 PM

## 2015-09-10 NOTE — [facility] Group Notes (Signed)
[facility] LCSW Group Therapy  09/10/2015 5:04 PM  Type of Therapy:  Group Therapy  Participation Level:  Did Not Attend   Keene Breath, MSW, LCSWA 09/10/2015, 5:04 PM

## 2015-09-10 NOTE — Plan of Care (Signed)
Problem: [facility] Participation in Recreation Therapeutic Interventions Goal: STG-Patient will demonstrate improved self esteem by identif STG: Self-Esteem - Within 3 treatment sessions, patient will verbalize at least 5 positive affirmation statements in one treatment session to increase self-esteem post d/c.  Outcome: Completed/Met Date Met:  09/10/15 Treatment Session 1; Completed 1 out of 1: At approximately 12:55 pm, LRT met with patient in patient room. Patient verbalized 5 positive affirmation statements. Patient reported it felt "good". LRT encouraged patient to continue saying positive affirmation statements.  Leonette Monarch, LRT/CTRS 09.12.16 2:19 pm Goal: STG-Other Recreation Therapy Goal (Specify) STG: Stress Management - Within 3 treatment sessions, patient will verbalize understanding of the stress management techniques in one treatment session to increase stress management skills post d/c.  Outcome: Completed/Met Date Met:  09/10/15 Treatment Session 1; Completed 1 out of 1: At approximately 12:55 pm, LRT met with patient in patient room. LRT educated and provided patient with handouts on the stress management techniques. Patient verbalized understanding. LRT encouraged patient to read over and practice the stress management techniques.  Leonette Monarch, LRT/CTRS 09.12.16 2:21 pm

## 2015-09-10 NOTE — Progress Notes (Signed)
D: Patient stated she slept good. She complains of low energy. She states 0 pain on a scale of 0-10. She denies having SI/HI. A: Patient was given encouragement. Patient was given scheduled medications. Patient was encouraged to complete ADLs including taking a shower.  R: Patient was receptive. She was cooperative when giving medication. She did not take shower. Patient fell asleep during meal.

## 2015-09-11 LAB — GLUCOSE, CAPILLARY
Glucose-Capillary: 109 mg/dL — ABNORMAL HIGH (ref 65–99)
Glucose-Capillary: 104 mg/dL — ABNORMAL HIGH (ref 65–99)

## 2015-09-11 MED ORDER — BENZTROPINE MESYLATE 0.5 MG PO TABS: 0.5000 mg | ORAL_TABLET | Freq: Two times a day (BID) | ORAL | Status: DC

## 2015-09-11 MED ORDER — LORAZEPAM 0.5 MG PO TABS: 0.5000 mg | ORAL_TABLET | Freq: Three times a day (TID) | ORAL | Status: DC | PRN

## 2015-09-11 MED ORDER — PALIPERIDONE PALMITATE 234 MG/1.5ML IM SUSP: 234.0000 mg | INTRAMUSCULAR | Status: DC

## 2015-09-11 MED ORDER — RISPERIDONE 3 MG PO TBDP: 3.0000 mg | ORAL_TABLET | Freq: Every day | ORAL | Status: AC

## 2015-09-11 NOTE — Progress Notes (Signed)
D: pt aware of discharge this shift, pt denies suicidal ideation or homicidal ideation, pt calm and cooperative, no distress noted  A: all personal items in locker returned to patient, medications returned from pharmacy, instructions given on discharge information, received prescriptions and follow up appointment  R: patient states she will comply with outpatient services and medications as prescribed.  Patient left with father and husband.

## 2015-09-11 NOTE — Progress Notes (Signed)
Recreation Therapy Notes  INPATIENT RECREATION TR PLAN  Patient Details Name: [name] MRN: [id number] DOB: 01-Aug-1971 Today's Date: 09/11/2015  Rec Therapy Plan Is patient appropriate for Therapeutic Recreation?: Yes Treatment times per week: At least once a week TR Treatment/Interventions: 1:1 session, Group participation (Comment) (Appropriate participation in daily recreation therapy tx)  Discharge Criteria Pt will be discharged from therapy if:: Discharged Treatment plan/goals/alternatives discussed and agreed upon by:: Patient/family  Discharge Summary Short term goals set: See Care Plan Short term goals met: Complete Progress toward goals comments: One-to-one attended Which groups?: Other (Comment), Coping skills, Leisure education Water engineer) One-to-one attended: Self-esteem, stress management  Reason goals not met: N/A Therapeutic equipment acquired: None Reason patient discharged from therapy: Discharge from hospital Pt/family agrees with progress & goals achieved: Yes Date patient discharged from therapy: 09/11/15   Leonette Monarch, LRT/CTRS 09/11/2015, 2:14 PM

## 2015-09-11 NOTE — Progress Notes (Signed)
  [facility] Adult Case Management Discharge Plan :  Will you be returning to the same living situation after discharge:  Yes,  home with husband At discharge, do you have transportation home?: Yes,  husband Thayer Jew will pick up Do you have the ability to pay for your medications: Yes,  patient has [employer]  Release of information consent forms completed and in the chart;  Patient's signature needed at discharge.  Patient to Follow up at: Follow-up Information    Follow up with [facility]. Go on 09/13/2015.   Why:  For follow-up care appt Thursday 09/13/15 8:30am   Contact information:   [place], [place] Ph [phone] Ph [phone] (outpatient only) Fax [phone]       Patient denies SI/HI: Yes,  patient deneis SI/HI    Safety Planning and Suicide Prevention discussed: Yes,  SPE discussed with patient and her husband [name] [phone]  Have you used any form of tobacco in the last 30 days? (Cigarettes, Smokeless Tobacco, Cigars, and/or Pipes): No  Has patient been referred to the Quitline?: N/A patient is not a smoker  [name], MSW, LCSWA  09/11/2015, 12:06 PM

## 2015-09-11 NOTE — Progress Notes (Signed)
D: Patient denies SI/HI/AVH.  Patient affect is flat and her mood is depressed.  Patient did NOT attend evening group. Patient visible on the milieu. No distress noted. A: Support and encouragement offered. Scheduled medications given to pt. Q 15 min checks continued for patient safety. R: Patient receptive. Patient remains safe on the unit.

## 2015-09-11 NOTE — Discharge Summary (Signed)
Physician Discharge Summary Note  Patient:  [name] is an 44 y.o., female MRN:  [id number] DOB:  December 30, 1970 Patient phone:  [phone] (home)  Patient address:   [address] [place]. [place] [address],  Total Time spent with patient: 30 minutes  Date of Admission:  09/06/2015 Date of Discharge: 09/11/2015  Reason for Admission:  Psychotic break.  Identifying data. [name] is a 44 year old female with history of schizoaffective disorder.  Chief complaint. "I need a therapist."  History of present illness. [name] has a long history of mental illness. She has been off her appetite psychotic for at least a year now. In the past 2 weeks she became insomniac, restless, lost her appetite, has racing thoughts, became irritable and somewhat unpredictable. She made an appointment with a therapist for next week. She felt so bad that she came to crisis center asking for help. She reports that in the past when on Risperdal she did very well. She feels anxious, restless, and tired from not sleeping or eating. She denies psychotic symptoms. She denies suicidal or homicidal ideation. She denies alcohol or illicit substance use.  Past psychiatric history. There are several hospitalizations at [facility], [facility] and [facility] always for manic and sometimes psychotic episodes. She remembers being treated with Depakote in the past but does not remember any other medication except for Risperdal. She denies ever attempting suicide.  Family psychiatric history. Nonreported.  Social history. She graduated from high school and obtained a degree in early childhood development. She has not been employed recently as she cannot find a job. She believes that she could work. She has been married for 7 years. She lives with her husband was supportive. She has no children. She has Multimedia programmer.  Principal Problem: Paranoid schizophrenia Discharge Diagnoses: Patient Active Problem List    Diagnosis Date Noted  . Paranoid schizophrenia [F20.0] 07/20/2014  . 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 Contact dermatitis [L25.9] 01/18/2013  . Vaginitis [N76.0] 01/18/2013  . Essential hypertension, benign [I10] 12/24/2012  . Heartburn [R12] 12/24/2012  . Hypothyroidism [E03.9] 12/24/2012  . 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 Acute DVT (deep venous thrombosis) [I82.409] 09/27/2012  . Chronic kidney disease [N18.9] 08/28/2012  . Fibroid uterus [D25.9] 06/12/2012    Musculoskeletal: Strength & Muscle Tone: within normal limits Gait & Station: normal Patient leans: N/A  Psychiatric Specialty Exam: Physical Exam  Nursing note and vitals reviewed.   Review of Systems  HENT:       Difficulties swallowing.  All other systems reviewed and are negative.   Blood pressure 110/77, pulse 120, temperature 98.2 F (36.8 C), temperature source Oral, resp. rate 18, height 5\' 9"  (1.753 m), weight 120.657 kg (266 lb), last menstrual period 09/18/2012, SpO2 98 %.Body mass index is 39.26 kg/(m^2).   See SRA.                                                  Sleep:  Number of Hours: 8   Have you used any form of tobacco in the last 30 days? (Cigarettes, Smokeless Tobacco, Cigars, and/or Pipes): No  Has this patient used any form of tobacco in the last 30 days? (Cigarettes, Smokeless Tobacco, Cigars, and/or Pipes) No  Past Medical History:  Past  Medical History  Diagnosis Date  . Thyroid disease   . Fibroid tumor   . DVT (deep venous thrombosis) 09/30/12    in upper extremities b/l and LLE  . Anemia   . Thyroid nodule     s/p biopsy  . Graves disease   . Multinodular goiter 10/01/2012  . Lupus anticoagulant positive 10/02/2012  . Hypertension   . 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 Arnoldo Morale on 12/13/2012.  Marland Kitchen Anticardiolipin antibody  positive 04/26/2013    IgG  . Paranoid schizophrenia   . Delusional disorder     Past Surgical History  Procedure Laterality Date  . Supracervical abdominal hysterectomy  09/27/2012    Procedure: HYSTERECTOMY SUPRACERVICAL ABDOMINAL;  Surgeon: Jonnie Kind, MD;  Location: [facility];  Service: Gynecology;  Laterality: N/A;  . Hematoma evacuation  10/03/2012    Procedure: EVACUATION HEMATOMA;  Surgeon: Jonnie Kind, MD;  Location: [facility];  Service: Gynecology;  Laterality: N/A;  . Thyroidectomy  12/13/2012    Procedure: THYROIDECTOMY;  Surgeon: Jamesetta So, MD;  Location: [facility];  Service: General;  Laterality: Bilateral;  . Abdominal hysterectomy     Family History:  Family History  Problem Relation Age of Onset  . Diabetes Mother   . Hypertension Mother   . Cancer Father     prostate   . Cancer Sister     pancreatic cancer   Social History:  History  Alcohol Use No     History  Drug Use No    Social History   Social History  . Marital Status: Married    Spouse Name: N/A  . Number of Children: N/A  . Years of Education: N/A   Social History Main Topics  . Smoking status: Never Smoker   . Smokeless tobacco: Never Used  . Alcohol Use: No  . Drug Use: No  . Sexual Activity: Yes    Birth Control/ Protection: Surgical   Other Topics Concern  . None   Social History Narrative    Past Psychiatric History: Hospitalizations:  Outpatient Care:  Substance Abuse Care:  Self-Mutilation:  Suicidal Attempts:  Violent Behaviors:   Risk to Self: Suicidal Ideation: Yes-Currently Present Suicidal Intent: Yes-Currently Present Is patient at risk for suicide?: Yes Suicidal Plan?: No Access to Means: Yes Specify Access to Suicidal Means: Has access to multiple means What has been your use of drugs/alcohol within the last 12 months?: None Reported How many times?: 0 Other Self Harm Risks: None Reported Triggers for Past Attempts: Other (Comment),  Hallucinations Intentional Self Injurious Behavior: None Risk to Others: Homicidal Ideation: No Thoughts of Harm to Others: No Current Homicidal Intent: No Current Homicidal Plan: No Access to Homicidal Means: No Identified Victim: None Reported History of harm to others?: No Assessment of Violence: None Noted Violent Behavior Description: None Reported Does patient have access to weapons?: No Criminal Charges Pending?: No Does patient have a court date: No Prior Inpatient Therapy: Prior Inpatient Therapy: Yes Prior Therapy Dates: 2015 & 2016 Prior [facility]ilty[facility]Provider([facility], [facility], [facility] Reason for Treatment: Schizophrenia Prior Outpatient Therapy: Prior Outpatient Therapy: Yes Prior Therapy Dates: Current Prior Therapy Facilty/Provider(s): [place] Reason for Treatment: Schizophrenia Does patient have an ACCT team?: No Does patient have Intensive In-House Services?  : No Does patient have [facility] services? : No Does patient have P4CC services?: No  Level of Care:  OP  Hospital Course:    Ms. Loveall is a 44 year old female with a history of schizophrenia admitted for disorganized psychotic, possibly manic behavior in the context of medication noncompliance.  1. Psychosis. We restarted Risperdal and gave two initial doses of Invega sustenna. We started Depakote for mood stabilization. VPA level 107, ammonia 10. We will lower Depakote dose to 1000 mg at bedtime.   2. Hypertension. We continued Norvasc, hydralazine and hydrochlorothiazide.  3. Diabetes. Metformin with blood glucose monitoring and diabetic diet.  4. Anxiety. She is on low-dose Klonopin.  5. Thyroid disease. We'll continue Synthroid. The patient has enlarged thyroid that requires surgery. She complains of difficulties swallowing. Thyroid function tests are normal.  6. Insomnia. We offered trazodone and as needed Ambien.    7. Disposition. She was discharged to home with her husband. She will follow up with [facility].    Consults:  None  Significant Diagnostic Studies:  None  Discharge Vitals:   Blood pressure 110/77, pulse 120, temperature 98.2 F (36.8 C), temperature source Oral, resp. rate 18, height 5\' 9"  (1.753 m), weight 120.657 kg (266 lb), last menstrual period 09/18/2012, SpO2 98 %. Body mass index is 39.26 kg/(m^2). Lab Results:   Results for orders placed or performed during the hospital encounter of 09/06/15 (from the past 72 hour(s))  Glucose, capillary     Status: Abnormal   Collection Time: 09/08/15  8:33 PM  Result Value Ref Range   Glucose-Capillary 111 (H) 65 - 99 mg/dL  Valproic acid level     Status: None   Collection Time: 09/09/15  6:19 AM  Result Value Ref Range   Valproic Acid Lvl 90 50.0 - 100.0 ug/mL  Glucose, capillary     Status: Abnormal   Collection Time: 09/09/15 12:12 PM  Result Value Ref Range   Glucose-Capillary 109 (H) 65 - 99 mg/dL  Glucose, capillary     Status: None   Collection Time: 09/09/15  4:42 PM  Result Value Ref Range   Glucose-Capillary 88 65 - 99 mg/dL   Comment 1 Notify RN   Glucose, capillary     Status: Abnormal   Collection Time: 09/09/15  8:29 PM  Result Value Ref Range   Glucose-Capillary 191 (H) 65 - 99 mg/dL  Valproic acid level     Status: Abnormal   Collection Time: 09/10/15  6:53 AM  Result Value Ref Range   Valproic Acid Lvl 107 (H) 50.0 - 100.0 ug/mL  Glucose, capillary     Status: Abnormal   Collection Time: 09/10/15  7:46 AM  Result Value Ref Range   Glucose-Capillary 123 (H) 65 - 99 mg/dL  Ammonia     Status: None   Collection Time: 09/10/15  1:47 PM  Result Value Ref Range   Ammonia 10 9 - 35 umol/L  Glucose, capillary     Status: Abnormal   Collection Time: 09/11/15  6:50 AM  Result Value Ref Range   Glucose-Capillary 104 (H) 65 - 99 mg/dL    Physical Findings: AIMS:  , ,  ,  ,    CIWA:    COWS:      See  Psychiatric Specialty Exam and Suicide Risk Assessment completed by Attending Physician prior to discharge.  Discharge destination:  Home  Is patient on multiple antipsychotic therapies at discharge:  Yes,   Do you recommend tapering to monotherapy for antipsychotics?  Yes   Has Patient had three or more failed trials of antipsychotic monotherapy by history:  No  Recommended Plan for Multiple Antipsychotic Therapies: Taper to monotherapy as described:  The patient will eventually continue on Invega Sustenna injections as guided by her outpatient provider  Discharge Instructions    Diet - low sodium heart healthy    Complete by:  As directed      Diet - low sodium heart healthy    Complete by:  As directed      Increase activity slowly    Complete by:  As directed      Increase activity slowly    Complete by:  As directed             Medication List    TAKE these medications      Indication   amLODipine 10 MG tablet  Commonly known as:  NORVASC  Take 1 tablet (10 mg total) by mouth daily.   Indication:  High Blood Pressure     benztropine 0.5 MG tablet  Commonly known as:  COGENTIN  Take 1 tablet (0.5 mg total) by mouth 2 (two) times daily.   Indication:  Extrapyramidal Reaction caused by Medications     benztropine 0.5 MG tablet  Commonly known as:  COGENTIN  Take 1 tablet (0.5 mg total) by mouth 2 (two) times daily.   Indication:  Extrapyramidal Reaction caused by Medications     clonazePAM 0.5 MG tablet  Commonly known as:  KLONOPIN  Take 1 tablet (0.5 mg total) by mouth 2 (two) times daily.   Indication:  Panic Disorder     divalproex 125 MG capsule  Commonly known as:  DEPAKOTE SPRINKLE  Take 8 capsules (1,000 mg total) by mouth at bedtime.   Indication:  Manic Phase of Manic-Depression     hydrALAZINE 50 MG tablet  Commonly known as:  APRESOLINE  Take 1 tablet (50 mg total) by mouth every 8 (eight) hours.   Indication:  High Blood Pressure      hydrochlorothiazide 25 MG tablet  Commonly known as:  HYDRODIURIL  Take 1 tablet (25 mg total) by mouth daily.   Indication:  High Blood Pressure     levothyroxine 125 MCG tablet  Commonly known as:  SYNTHROID  Take 1 tablet (125 mcg total) by mouth daily.   Indication:  Underactive Thyroid     metFORMIN 500 MG 24 hr tablet  Commonly known as:  GLUCOPHAGE-XR  Take 1 tablet (500 mg total) by mouth daily with breakfast.   Indication:  Type 2 Diabetes     metoprolol 50 MG tablet  Commonly known as:  LOPRESSOR  Take 1 tablet (50 mg total) by mouth 2 (two) times daily.   Indication:  High Blood Pressure     paliperidone 234 MG/1.5ML Susp injection  Commonly known as:  INVEGA SUSTENNA  Inject 234 mg into the muscle every 28 (twenty-eight) days.  Start taking on:  10/08/2015   Indication:  Schizoaffective Disorder     risperidone 3 MG disintegrating tablet  Commonly known as:  RISPERDAL M-TABS  Take 1 tablet (3 mg total) by mouth at bedtime.   Indication:  Manic-Depression     traZODone 100 MG tablet  Commonly known as:  DESYREL  Take 1 tablet (100 mg total) by mouth at bedtime.   Indication:  Trouble Sleeping     traZODone 100 MG tablet  Commonly known as:  DESYREL  Take 1 tablet (100 mg total) by mouth at bedtime.   Indication:  Trouble Sleeping     zolpidem 5 MG tablet  Commonly  known as:  AMBIEN  Take 1 tablet (5 mg total) by mouth at bedtime as needed for sleep.   Indication:  Trouble Sleeping           Follow-up Information    Go to [facility].   Why:  For follow-up care appt    Contact information:   [place], [place] Ph [phone] Ph [phone] (outpatient only) Fax [phone]       Follow-up recommendations:  Activity:  As tolerated. Diet:  Low sodium heart healthy. Other:  He follow-up appointments.  Comments:    Total Discharge Time: 35 min.  Signed: Orson Slick 09/11/2015, 10:17 AM

## 2015-09-11 NOTE — Clinical Social Work Note (Signed)
CSW called [facility] [phone] and patient is no longer able to be seen due to their "No Show Policy". Patient will need a referral that take her insurance [employer].

## 2015-09-11 NOTE — [facility] Suicide Risk Assessment (Signed)
[place] INPATIENT:  Family/Significant Other Suicide Prevention Education  Suicide Prevention Education:  Education Completed; Trenese Haft (husband) [phone] has been identified by the patient as the family member/significant other with whom the patient will be residing, and identified as the person(s) who will aid the patient in the event of a mental health crisis (suicidal ideations/suicide attempt).  With written consent from the patient, the family member/significant other has been provided the following suicide prevention education, prior to the and/or following the discharge of the patient.  The suicide prevention education provided includes the following:  Suicide risk factors  Suicide prevention and interventions  National Suicide Hotline telephone number  [facility] assessment telephone number  [facility] Emergency Assistance [place] and/or Residential Mobile Crisis Unit telephone number  Request made of family/significant other to:  Remove weapons (e.g., guns, rifles, knives), all items previously/currently identified as safety concern.    Remove drugs/medications (over-the-counter, prescriptions, illicit drugs), all items previously/currently identified as a safety concern.  The family member/significant other verbalizes understanding of the suicide prevention education information provided.  The family member/significant other agrees to remove the items of safety concern listed above.  Keene Breath, MSW, LCSWA 09/11/2015, 12:05 PM

## 2015-09-18 NOTE — Care Management Utilization Note (Signed)
This patient was authorized via [employer] portal by this Probation officer on 09/06/15. Per Randall Hiss, Transport planner at [facility], this patient with this date of birth and ID number is  NOT a [employer] [place] member. Auth was rescinded by [place]_ C.Coralyn Mark RN BSN

## 2015-10-15 ENCOUNTER — Other Ambulatory Visit: Payer: Self-pay | Admitting: Psychiatry

## 2018-07-24 ENCOUNTER — Inpatient Hospital Stay (HOSPITAL_COMMUNITY)
Admission: EM | Admit: 2018-07-24 | Discharge: 2018-08-11 | DRG: 870 | Disposition: A | Discharge status: Skilled Nursing Facility | Payer: BLUE CROSS/BLUE SHIELD | Attending: Internal Medicine | Admitting: Internal Medicine

## 2018-07-24 ENCOUNTER — Emergency Department (HOSPITAL_COMMUNITY): Payer: BLUE CROSS/BLUE SHIELD

## 2018-07-24 ENCOUNTER — Encounter (HOSPITAL_COMMUNITY): Payer: Self-pay | Admitting: Emergency Medicine

## 2018-07-24 DIAGNOSIS — J15211 Pneumonia due to Methicillin susceptible Staphylococcus aureus: Secondary | ICD-10-CM | POA: Diagnosis not present

## 2018-07-24 DIAGNOSIS — Z0189 Encounter for other specified special examinations: Secondary | ICD-10-CM

## 2018-07-24 DIAGNOSIS — J9600 Acute respiratory failure, unspecified whether with hypoxia or hypercapnia: Secondary | ICD-10-CM | POA: Diagnosis not present

## 2018-07-24 DIAGNOSIS — N182 Chronic kidney disease, stage 2 (mild): Secondary | ICD-10-CM | POA: Diagnosis present

## 2018-07-24 DIAGNOSIS — R05 Cough: Secondary | ICD-10-CM

## 2018-07-24 DIAGNOSIS — I1310 Hypertensive heart and chronic kidney disease without heart failure, with stage 1 through stage 4 chronic kidney disease, or unspecified chronic kidney disease: Secondary | ICD-10-CM | POA: Diagnosis present

## 2018-07-24 DIAGNOSIS — D696 Thrombocytopenia, unspecified: Secondary | ICD-10-CM | POA: Diagnosis present

## 2018-07-24 DIAGNOSIS — E870 Hyperosmolality and hypernatremia: Secondary | ICD-10-CM | POA: Diagnosis not present

## 2018-07-24 DIAGNOSIS — F200 Paranoid schizophrenia: Secondary | ICD-10-CM | POA: Diagnosis present

## 2018-07-24 DIAGNOSIS — E1310 Other specified diabetes mellitus with ketoacidosis without coma: Secondary | ICD-10-CM | POA: Diagnosis not present

## 2018-07-24 DIAGNOSIS — I10 Essential (primary) hypertension: Secondary | ICD-10-CM | POA: Diagnosis not present

## 2018-07-24 DIAGNOSIS — D638 Anemia in other chronic diseases classified elsewhere: Secondary | ICD-10-CM | POA: Diagnosis not present

## 2018-07-24 DIAGNOSIS — E1110 Type 2 diabetes mellitus with ketoacidosis without coma: Secondary | ICD-10-CM | POA: Diagnosis present

## 2018-07-24 DIAGNOSIS — R195 Other fecal abnormalities: Secondary | ICD-10-CM

## 2018-07-24 DIAGNOSIS — Z6841 Body Mass Index (BMI) 40.0 and over, adult: Secondary | ICD-10-CM

## 2018-07-24 DIAGNOSIS — Z452 Encounter for adjustment and management of vascular access device: Secondary | ICD-10-CM

## 2018-07-24 DIAGNOSIS — E1311 Other specified diabetes mellitus with ketoacidosis with coma: Secondary | ICD-10-CM | POA: Diagnosis not present

## 2018-07-24 DIAGNOSIS — R4182 Altered mental status, unspecified: Secondary | ICD-10-CM

## 2018-07-24 DIAGNOSIS — R1110 Vomiting, unspecified: Secondary | ICD-10-CM

## 2018-07-24 DIAGNOSIS — G9341 Metabolic encephalopathy: Secondary | ICD-10-CM | POA: Diagnosis present

## 2018-07-24 DIAGNOSIS — Y95 Nosocomial condition: Secondary | ICD-10-CM | POA: Diagnosis present

## 2018-07-24 DIAGNOSIS — E8770 Fluid overload, unspecified: Secondary | ICD-10-CM | POA: Diagnosis present

## 2018-07-24 DIAGNOSIS — Z79890 Hormone replacement therapy (postmenopausal): Secondary | ICD-10-CM | POA: Diagnosis not present

## 2018-07-24 DIAGNOSIS — E876 Hypokalemia: Secondary | ICD-10-CM | POA: Diagnosis not present

## 2018-07-24 DIAGNOSIS — D6489 Other specified anemias: Secondary | ICD-10-CM | POA: Diagnosis present

## 2018-07-24 DIAGNOSIS — L97419 Non-pressure chronic ulcer of right heel and midfoot with unspecified severity: Secondary | ICD-10-CM | POA: Diagnosis present

## 2018-07-24 DIAGNOSIS — N170 Acute kidney failure with tubular necrosis: Secondary | ICD-10-CM | POA: Diagnosis not present

## 2018-07-24 DIAGNOSIS — E1140 Type 2 diabetes mellitus with diabetic neuropathy, unspecified: Secondary | ICD-10-CM | POA: Diagnosis present

## 2018-07-24 DIAGNOSIS — Z86718 Personal history of other venous thrombosis and embolism: Secondary | ICD-10-CM

## 2018-07-24 DIAGNOSIS — E1165 Type 2 diabetes mellitus with hyperglycemia: Secondary | ICD-10-CM

## 2018-07-24 DIAGNOSIS — D72829 Elevated white blood cell count, unspecified: Secondary | ICD-10-CM | POA: Diagnosis not present

## 2018-07-24 DIAGNOSIS — R739 Hyperglycemia, unspecified: Secondary | ICD-10-CM | POA: Diagnosis present

## 2018-07-24 DIAGNOSIS — E1111 Type 2 diabetes mellitus with ketoacidosis with coma: Secondary | ICD-10-CM | POA: Diagnosis not present

## 2018-07-24 DIAGNOSIS — J9690 Respiratory failure, unspecified, unspecified whether with hypoxia or hypercapnia: Secondary | ICD-10-CM

## 2018-07-24 DIAGNOSIS — Z833 Family history of diabetes mellitus: Secondary | ICD-10-CM

## 2018-07-24 DIAGNOSIS — Z9114 Patient's other noncompliance with medication regimen: Secondary | ICD-10-CM | POA: Diagnosis not present

## 2018-07-24 DIAGNOSIS — D6862 Lupus anticoagulant syndrome: Secondary | ICD-10-CM | POA: Diagnosis present

## 2018-07-24 DIAGNOSIS — E890 Postprocedural hypothyroidism: Secondary | ICD-10-CM | POA: Diagnosis not present

## 2018-07-24 DIAGNOSIS — R579 Shock, unspecified: Secondary | ICD-10-CM | POA: Diagnosis present

## 2018-07-24 DIAGNOSIS — E1122 Type 2 diabetes mellitus with diabetic chronic kidney disease: Secondary | ICD-10-CM | POA: Diagnosis present

## 2018-07-24 DIAGNOSIS — N179 Acute kidney failure, unspecified: Secondary | ICD-10-CM | POA: Diagnosis not present

## 2018-07-24 DIAGNOSIS — E875 Hyperkalemia: Secondary | ICD-10-CM | POA: Diagnosis not present

## 2018-07-24 DIAGNOSIS — E039 Hypothyroidism, unspecified: Secondary | ICD-10-CM | POA: Diagnosis not present

## 2018-07-24 DIAGNOSIS — K5900 Constipation, unspecified: Secondary | ICD-10-CM | POA: Diagnosis not present

## 2018-07-24 DIAGNOSIS — R058 Other specified cough: Secondary | ICD-10-CM

## 2018-07-24 DIAGNOSIS — R059 Cough, unspecified: Secondary | ICD-10-CM

## 2018-07-24 DIAGNOSIS — H0520 Unspecified exophthalmos: Secondary | ICD-10-CM | POA: Diagnosis present

## 2018-07-24 DIAGNOSIS — J9601 Acute respiratory failure with hypoxia: Secondary | ICD-10-CM | POA: Diagnosis present

## 2018-07-24 DIAGNOSIS — Z90710 Acquired absence of both cervix and uterus: Secondary | ICD-10-CM

## 2018-07-24 DIAGNOSIS — L97429 Non-pressure chronic ulcer of left heel and midfoot with unspecified severity: Secondary | ICD-10-CM | POA: Diagnosis not present

## 2018-07-24 DIAGNOSIS — M329 Systemic lupus erythematosus, unspecified: Secondary | ICD-10-CM | POA: Diagnosis not present

## 2018-07-24 DIAGNOSIS — R0902 Hypoxemia: Secondary | ICD-10-CM

## 2018-07-24 DIAGNOSIS — A419 Sepsis, unspecified organism: Principal | ICD-10-CM | POA: Diagnosis present

## 2018-07-24 DIAGNOSIS — E860 Dehydration: Secondary | ICD-10-CM

## 2018-07-24 DIAGNOSIS — G40901 Epilepsy, unspecified, not intractable, with status epilepticus: Secondary | ICD-10-CM

## 2018-07-24 DIAGNOSIS — E11621 Type 2 diabetes mellitus with foot ulcer: Secondary | ICD-10-CM | POA: Diagnosis present

## 2018-07-24 DIAGNOSIS — E0911 Drug or chemical induced diabetes mellitus with ketoacidosis with coma: Secondary | ICD-10-CM | POA: Diagnosis not present

## 2018-07-24 DIAGNOSIS — E0810 Diabetes mellitus due to underlying condition with ketoacidosis without coma: Secondary | ICD-10-CM | POA: Diagnosis not present

## 2018-07-24 DIAGNOSIS — E878 Other disorders of electrolyte and fluid balance, not elsewhere classified: Secondary | ICD-10-CM | POA: Diagnosis present

## 2018-07-24 DIAGNOSIS — K922 Gastrointestinal hemorrhage, unspecified: Secondary | ICD-10-CM

## 2018-07-24 DIAGNOSIS — E861 Hypovolemia: Secondary | ICD-10-CM | POA: Diagnosis not present

## 2018-07-24 DIAGNOSIS — Z79899 Other long term (current) drug therapy: Secondary | ICD-10-CM

## 2018-07-24 DIAGNOSIS — R5381 Other malaise: Secondary | ICD-10-CM | POA: Diagnosis not present

## 2018-07-24 DIAGNOSIS — G4700 Insomnia, unspecified: Secondary | ICD-10-CM | POA: Diagnosis not present

## 2018-07-24 DIAGNOSIS — Z4659 Encounter for fitting and adjustment of other gastrointestinal appliance and device: Secondary | ICD-10-CM

## 2018-07-24 DIAGNOSIS — F250 Schizoaffective disorder, bipolar type: Secondary | ICD-10-CM | POA: Diagnosis not present

## 2018-07-24 DIAGNOSIS — Z7984 Long term (current) use of oral hypoglycemic drugs: Secondary | ICD-10-CM

## 2018-07-24 LAB — URINALYSIS, ROUTINE W REFLEX MICROSCOPIC
BILIRUBIN URINE: NEGATIVE
Bilirubin Urine: NEGATIVE
Glucose, UA: >=500 mg/dL — AB
KETONES UR: 5 mg/dL — AB
Ketones, ur: 5 mg/dL — AB
NITRITE: NEGATIVE
Nitrite: NEGATIVE
PH: 5 (ref 5.0–8.0)
PH: 5 (ref 5.0–8.0)
PROTEIN: 30 mg/dL — AB
Protein, ur: 30 mg/dL — AB
SPECIFIC GRAVITY, URINE: 1.023 (ref 1.005–1.030)
Specific Gravity, Urine: 1.022 (ref 1.005–1.030)

## 2018-07-24 LAB — CBC WITH DIFFERENTIAL/PLATELET
ABS IMMATURE GRANULOCYTES: 0.1 10*3/uL (ref 0.0–0.1)
BASOS PCT: 0 %
BASOS PCT: 1 %
Basophils Absolute: 0 10*3/uL (ref 0.0–0.1)
Basophils Absolute: 0.1 10*3/uL (ref 0.0–0.1)
EOS ABS: 0.1 10*3/uL (ref 0.0–0.7)
Eosinophils Absolute: 0 10*3/uL (ref 0.0–0.7)
Eosinophils Relative: 0 %
Eosinophils Relative: 2 %
HCT: 54.8 % — ABNORMAL HIGH (ref 36.0–46.0)
HCT: 47.2 % — ABNORMAL HIGH (ref 36.0–46.0)
Hemoglobin: 14.2 g/dL (ref 12.0–15.0)
Hemoglobin: 13.2 g/dL (ref 12.0–15.0)
IMMATURE GRANULOCYTES: 1 %
LYMPHS ABS: 0.9 10*3/uL (ref 0.7–4.0)
Lymphocytes Relative: 8 %
Lymphocytes Relative: 17 %
Lymphs Abs: 1.2 10*3/uL (ref 0.7–4.0)
MCH: 27.8 pg (ref 26.0–34.0)
MCH: 26.6 pg (ref 26.0–34.0)
MCHC: 25.9 g/dL — ABNORMAL LOW (ref 30.0–36.0)
MCHC: 28 g/dL — ABNORMAL LOW (ref 30.0–36.0)
MCV: 107.2 fL — ABNORMAL HIGH (ref 78.0–100.0)
MCV: 95 fL (ref 78.0–100.0)
MONOS PCT: 5 %
Monocytes Absolute: 0.7 10*3/uL (ref 0.1–1.0)
Monocytes Absolute: 0.4 10*3/uL (ref 0.1–1.0)
Monocytes Relative: 6 %
NEUTROS ABS: 5.3 10*3/uL (ref 1.7–7.7)
NEUTROS PCT: 74 %
Neutro Abs: 9.9 10*3/uL — ABNORMAL HIGH (ref 1.7–7.7)
Neutrophils Relative %: 86 %
PLATELETS: 177 10*3/uL (ref 150–400)
Platelets: 250 10*3/uL (ref 150–400)
RBC: 5.11 MIL/uL (ref 3.87–5.11)
RBC: 4.97 MIL/uL (ref 3.87–5.11)
RDW: 18.4 % — AB (ref 11.5–15.5)
RDW: 17.9 % — AB (ref 11.5–15.5)
WBC: 11.5 10*3/uL — ABNORMAL HIGH (ref 4.0–10.5)
WBC: 7.1 10*3/uL (ref 4.0–10.5)

## 2018-07-24 LAB — I-STAT CHEM 8, ED
BUN: 93 mg/dL — AB (ref 6–20)
Calcium, Ion: 1.2 mmol/L (ref 1.15–1.40)
Chloride: 101 mmol/L (ref 98–111)
Creatinine, Ser: 6.7 mg/dL — ABNORMAL HIGH (ref 0.44–1.00)
HEMATOCRIT: 48 % — AB (ref 36.0–46.0)
Hemoglobin: 16.3 g/dL — ABNORMAL HIGH (ref 12.0–15.0)
Potassium: 5.7 mmol/L — ABNORMAL HIGH (ref 3.5–5.1)
Sodium: 140 mmol/L (ref 135–145)
TCO2: 20 mmol/L — ABNORMAL LOW (ref 22–32)

## 2018-07-24 LAB — AMYLASE: Amylase: 891 U/L — ABNORMAL HIGH (ref 28–100)

## 2018-07-24 LAB — SALICYLATE LEVEL

## 2018-07-24 LAB — ETHANOL: Alcohol, Ethyl (B): <10 mg/dL (ref <10)

## 2018-07-24 LAB — COMPREHENSIVE METABOLIC PANEL
ALBUMIN: 3 g/dL — AB (ref 3.5–5.0)
ALK PHOS: 114 U/L (ref 38–126)
ALT: 45 U/L — ABNORMAL HIGH (ref 0–44)
ALT: 41 U/L (ref 0–44)
ANION GAP: 18 — AB (ref 5–15)
AST: 29 U/L (ref 15–41)
AST: 33 U/L (ref 15–41)
Albumin: 3.5 g/dL (ref 3.5–5.0)
Alkaline Phosphatase: 118 U/L (ref 38–126)
BUN: 95 mg/dL — ABNORMAL HIGH (ref 6–20)
BUN: 85 mg/dL — ABNORMAL HIGH (ref 6–20)
CHLORIDE: 115 mmol/L — AB (ref 98–111)
CO2: 18 mmol/L — ABNORMAL LOW (ref 22–32)
CO2: 18 mmol/L — AB (ref 22–32)
Calcium: 9.3 mg/dL (ref 8.9–10.3)
Calcium: 8.8 mg/dL — ABNORMAL LOW (ref 8.9–10.3)
Chloride: 96 mmol/L — ABNORMAL LOW (ref 98–111)
Creatinine, Ser: 7.72 mg/dL — ABNORMAL HIGH (ref 0.44–1.00)
Creatinine, Ser: 7.11 mg/dL — ABNORMAL HIGH (ref 0.44–1.00)
GFR calc Af Amer: 6 mL/min — ABNORMAL LOW (ref >60)
GFR calc Af Amer: 7 mL/min — ABNORMAL LOW (ref >60)
GFR calc non Af Amer: 5 mL/min — ABNORMAL LOW (ref >60)
GFR calc non Af Amer: 6 mL/min — ABNORMAL LOW (ref >60)
GLUCOSE: 729 mg/dL — AB (ref 70–99)
Glucose, Bld: 1556 mg/dL (ref 70–99)
POTASSIUM: 5.6 mmol/L — AB (ref 3.5–5.1)
POTASSIUM: 5.5 mmol/L — AB (ref 3.5–5.1)
SODIUM: 151 mmol/L — AB (ref 135–145)
Sodium: 139 mmol/L (ref 135–145)
TOTAL PROTEIN: 7.9 g/dL (ref 6.5–8.1)
Total Bilirubin: 1.5 mg/dL — ABNORMAL HIGH (ref 0.3–1.2)
Total Bilirubin: 1.5 mg/dL — ABNORMAL HIGH (ref 0.3–1.2)
Total Protein: 6.9 g/dL (ref 6.5–8.1)

## 2018-07-24 LAB — BLOOD GAS, ARTERIAL
ACID-BASE DEFICIT: 12.1 mmol/L — AB (ref 0.0–2.0)
BICARBONATE: 15 mmol/L — AB (ref 20.0–28.0)
Drawn by: 234301
FIO2: 100
LHR: 15 {breaths}/min
O2 SAT: 99.1 %
PATIENT TEMPERATURE: 37
PCO2 ART: 34.4 mmHg (ref 32.0–48.0)
PEEP: 5 cmH2O
PH ART: 7.232 — AB (ref 7.350–7.450)
VT: 530 mL
pO2, Arterial: 228 mmHg — ABNORMAL HIGH (ref 83.0–108.0)

## 2018-07-24 LAB — GLUCOSE, CAPILLARY
GLUCOSE-CAPILLARY: 270 mg/dL — AB (ref 70–99)
Glucose-Capillary: >600 mg/dL (ref 70–99)
Glucose-Capillary: >600 mg/dL (ref 70–99)
Glucose-Capillary: 541 mg/dL (ref 70–99)
Glucose-Capillary: 357 mg/dL — ABNORMAL HIGH (ref 70–99)
Glucose-Capillary: 401 mg/dL — ABNORMAL HIGH (ref 70–99)

## 2018-07-24 LAB — RAPID URINE DRUG SCREEN, HOSP PERFORMED
Amphetamines: NOT DETECTED
BARBITURATES: NOT DETECTED
BENZODIAZEPINES: NOT DETECTED
COCAINE: NOT DETECTED
Opiates: NOT DETECTED
TETRAHYDROCANNABINOL: NOT DETECTED

## 2018-07-24 LAB — PHOSPHORUS: PHOSPHORUS: 3.2 mg/dL (ref 2.5–4.6)

## 2018-07-24 LAB — PROTIME-INR
INR: 1.14
Prothrombin Time: 14.5 seconds (ref 11.4–15.2)

## 2018-07-24 LAB — LACTIC ACID, PLASMA
LACTIC ACID, VENOUS: 3.6 mmol/L — AB (ref 0.5–1.9)
Lactic Acid, Venous: 4.2 mmol/L (ref 0.5–1.9)

## 2018-07-24 LAB — MRSA PCR SCREENING: MRSA by PCR: NEGATIVE

## 2018-07-24 LAB — MAGNESIUM: Magnesium: 3.5 mg/dL — ABNORMAL HIGH (ref 1.7–2.4)

## 2018-07-24 LAB — CBG MONITORING, ED

## 2018-07-24 LAB — POC OCCULT BLOOD, ED: Fecal Occult Bld: POSITIVE — AB

## 2018-07-24 LAB — ACETAMINOPHEN LEVEL

## 2018-07-24 LAB — VALPROIC ACID LEVEL: Valproic Acid Lvl: <10 ug/mL — ABNORMAL LOW (ref 50.0–100.0)

## 2018-07-24 LAB — TROPONIN I: Troponin I: <0.03 ng/mL

## 2018-07-24 LAB — PROCALCITONIN: Procalcitonin: 1.04 ng/mL

## 2018-07-24 LAB — TSH: TSH: 6.379 u[IU]/mL — ABNORMAL HIGH (ref 0.350–4.500)

## 2018-07-24 MED ORDER — INSULIN REGULAR BOLUS VIA INFUSION
0.0000 [IU] | Freq: Three times a day (TID) | INTRAVENOUS | Status: DC
  Filled 2018-07-24: qty 10

## 2018-07-24 MED ORDER — LACTATED RINGERS IV BOLUS
1000.0000 mL | Freq: Once | INTRAVENOUS | Status: AC
  Administered 2018-07-24: 1000 mL via INTRAVENOUS

## 2018-07-24 MED ORDER — FENTANYL CITRATE (PF) 100 MCG/2ML IJ SOLN: 100.0000 ug | INTRAMUSCULAR | Status: DC | PRN

## 2018-07-24 MED ORDER — SODIUM CHLORIDE 0.9 % IV SOLN
0.0000 mg/h | INTRAVENOUS | Status: DC
  Administered 2018-07-24: 2 mg/h via INTRAVENOUS
  Administered 2018-07-24: 5 mg/h via INTRAVENOUS
  Administered 2018-07-25: 4 mg/h via INTRAVENOUS
  Filled 2018-07-24 (×3): qty 10

## 2018-07-24 MED ORDER — PANTOPRAZOLE SODIUM 40 MG IV SOLR
INTRAVENOUS | Status: AC
  Filled 2018-07-24: qty 40

## 2018-07-24 MED ORDER — MIDAZOLAM HCL 2 MG/2ML IJ SOLN
2.0000 mg | INTRAMUSCULAR | Status: DC | PRN
  Administered 2018-07-26 – 2018-07-30 (×2): 2 mg via INTRAVENOUS
  Filled 2018-07-24 (×2): qty 2

## 2018-07-24 MED ORDER — FENTANYL CITRATE (PF) 100 MCG/2ML IJ SOLN
100.0000 ug | INTRAMUSCULAR | Status: DC | PRN
  Administered 2018-07-25: 100 ug via INTRAVENOUS
  Filled 2018-07-24: qty 2

## 2018-07-24 MED ORDER — SODIUM CHLORIDE 0.9 % IV SOLN
250.0000 mL | INTRAVENOUS | Status: DC | PRN
  Administered 2018-07-28 – 2018-08-09 (×2): 250 mL via INTRAVENOUS

## 2018-07-24 MED ORDER — FAMOTIDINE IN NACL 20-0.9 MG/50ML-% IV SOLN: 20.0000 mg | Freq: Two times a day (BID) | INTRAVENOUS | Status: DC

## 2018-07-24 MED ORDER — SODIUM CHLORIDE 0.9 % IV SOLN
INTRAVENOUS | Status: DC
  Administered 2018-07-24: 13:00:00 via INTRAVENOUS

## 2018-07-24 MED ORDER — SODIUM CHLORIDE 0.9 % IV SOLN
INTRAVENOUS | Status: DC
  Administered 2018-07-24 – 2018-07-26 (×4): via INTRAVENOUS

## 2018-07-24 MED ORDER — SODIUM CHLORIDE 0.9 % IV SOLN
8.0000 mg/h | INTRAVENOUS | Status: DC
  Filled 2018-07-24 (×6): qty 80

## 2018-07-24 MED ORDER — ETOMIDATE 2 MG/ML IV SOLN
INTRAVENOUS | Status: AC | PRN
  Administered 2018-07-24: 20 mg via INTRAVENOUS

## 2018-07-24 MED ORDER — INSULIN REGULAR HUMAN 100 UNIT/ML IJ SOLN
INTRAMUSCULAR | Status: DC
Start: 2018-07-24 — End: 2018-07-27
  Administered 2018-07-24: 21.6 [IU]/h via INTRAVENOUS
  Administered 2018-07-24: 5.4 [IU]/h via INTRAVENOUS
  Administered 2018-07-25: 12 [IU]/h via INTRAVENOUS
  Administered 2018-07-26: 14.9 [IU]/h via INTRAVENOUS
  Administered 2018-07-26: 3.7 [IU]/h via INTRAVENOUS
  Administered 2018-07-27: 9.9 [IU]/h via INTRAVENOUS
  Administered 2018-07-27: 3.8 [IU]/h via INTRAVENOUS
  Filled 2018-07-24 (×7): qty 1

## 2018-07-24 MED ORDER — LEVOTHYROXINE SODIUM 100 MCG IV SOLR
62.5000 ug | Freq: Every day | INTRAVENOUS | Status: DC
  Administered 2018-07-24 – 2018-07-27 (×4): 62.5 ug via INTRAVENOUS
  Filled 2018-07-24 (×4): qty 5

## 2018-07-24 MED ORDER — MIDAZOLAM HCL 2 MG/2ML IJ SOLN: 2.0000 mg | INTRAMUSCULAR | Status: DC | PRN

## 2018-07-24 MED ORDER — SODIUM CHLORIDE 0.9 % IV BOLUS
2000.0000 mL | Freq: Once | INTRAVENOUS | Status: AC
  Administered 2018-07-24: 2000 mL via INTRAVENOUS

## 2018-07-24 MED ORDER — LORAZEPAM 2 MG/ML IJ SOLN
INTRAMUSCULAR | Status: AC
  Filled 2018-07-24: qty 1

## 2018-07-24 MED ORDER — LORAZEPAM 2 MG/ML IJ SOLN
INTRAMUSCULAR | Status: AC
  Administered 2018-07-24: 2 mg
  Filled 2018-07-24: qty 1

## 2018-07-24 MED ORDER — SODIUM CHLORIDE 0.9 % IV SOLN
INTRAVENOUS | Status: DC | PRN
  Administered 2018-07-24 – 2018-08-02 (×5): via INTRA_ARTERIAL

## 2018-07-24 MED ORDER — PROPOFOL 1000 MG/100ML IV EMUL
5.0000 ug/kg/min | Freq: Once | INTRAVENOUS | Status: AC
  Administered 2018-07-24: 11:00:00 via INTRAVENOUS

## 2018-07-24 MED ORDER — PROPOFOL 1000 MG/100ML IV EMUL
INTRAVENOUS | Status: AC
  Filled 2018-07-24: qty 100

## 2018-07-24 MED ORDER — LEVETIRACETAM IN NACL 1000 MG/100ML IV SOLN
1000.0000 mg | Freq: Once | INTRAVENOUS | Status: DC
  Filled 2018-07-24: qty 100

## 2018-07-24 MED ORDER — PANTOPRAZOLE SODIUM 40 MG IV SOLR
40.0000 mg | Freq: Once | INTRAVENOUS | Status: AC
  Administered 2018-07-24: 40 mg via INTRAVENOUS

## 2018-07-24 MED ORDER — DEXTROSE-NACL 5-0.45 % IV SOLN
INTRAVENOUS | Status: DC
  Administered 2018-07-25: 10:00:00 via INTRAVENOUS

## 2018-07-24 MED ORDER — SODIUM CHLORIDE 0.9 % IV BOLUS
1000.0000 mL | Freq: Once | INTRAVENOUS | Status: AC
  Administered 2018-07-24: 1000 mL via INTRAVENOUS

## 2018-07-24 MED ORDER — CHLORHEXIDINE GLUCONATE 0.12% ORAL RINSE (MEDLINE KIT)
15.0000 mL | Freq: Two times a day (BID) | OROMUCOSAL | Status: DC
  Administered 2018-07-24 – 2018-07-31 (×13): 15 mL via OROMUCOSAL

## 2018-07-24 MED ORDER — MIDAZOLAM BOLUS VIA INFUSION
1.0000 mg | INTRAVENOUS | Status: DC | PRN
  Administered 2018-07-24: 1 mg via INTRAVENOUS
  Administered 2018-07-24: 2 mg via INTRAVENOUS
  Administered 2018-07-25: 1 mg via INTRAVENOUS
  Administered 2018-07-25 (×2): 2 mg via INTRAVENOUS
  Filled 2018-07-24: qty 2

## 2018-07-24 MED ORDER — DEXTROSE 50 % IV SOLN
25.0000 mL | INTRAVENOUS | Status: DC | PRN
  Administered 2018-07-31: 25 mL via INTRAVENOUS
  Filled 2018-07-24: qty 50

## 2018-07-24 MED ORDER — SODIUM CHLORIDE 0.9 % IV SOLN
80.0000 mg | Freq: Once | INTRAVENOUS | Status: DC
  Filled 2018-07-24: qty 80

## 2018-07-24 MED ORDER — NALOXONE HCL 0.4 MG/ML IJ SOLN
0.4000 mg | Freq: Once | INTRAMUSCULAR | Status: AC
  Administered 2018-07-24: 0.4 mg via INTRAVENOUS

## 2018-07-24 MED ORDER — ORAL CARE MOUTH RINSE
15.0000 mL | OROMUCOSAL | Status: DC
  Administered 2018-07-24 – 2018-07-31 (×68): 15 mL via OROMUCOSAL

## 2018-07-24 MED ORDER — HEPARIN SODIUM (PORCINE) 5000 UNIT/ML IJ SOLN
5000.0000 [IU] | Freq: Three times a day (TID) | INTRAMUSCULAR | Status: DC
  Administered 2018-07-24 – 2018-07-31 (×20): 5000 [IU] via SUBCUTANEOUS
  Filled 2018-07-24 (×19): qty 1

## 2018-07-24 MED ORDER — LEVETIRACETAM IN NACL 1500 MG/100ML IV SOLN
1500.0000 mg | Freq: Once | INTRAVENOUS | Status: AC
  Administered 2018-07-24: 1500 mg via INTRAVENOUS
  Filled 2018-07-24: qty 100

## 2018-07-24 NOTE — Progress Notes (Signed)
eLink Physician-Brief Progress Note Patient Name: [name] DOB: 06/24/1971 MRN: [id number]   Date of Service  07/24/2018  HPI/Events of Note  Lactic Acid = 4.2 --> 3.6. Last LVEF = 55% to 60%.  eICU Interventions  Will order: 1. Bolus with 0.9 NaCl 1 liter IV over 1 hour now.  2. Continue to trend Lactic Acid.     Intervention Category Major Interventions: Acid-Base disturbance - evaluation and management;Respiratory failure - evaluation and management  Sommer,Steven Eugene 07/24/2018, 10:00 PM

## 2018-07-24 NOTE — Progress Notes (Signed)
Pt here from [facility] per [facility] on vent & insulin gtt

## 2018-07-24 NOTE — H&P (Addendum)
PULMONARY / CRITICAL CARE MEDICINE   Name: [name] MRN: [id number] DOB: Oct 27, 1971    ADMISSION DATE:  07/24/2018 CONSULTATION DATE:  07/24/2018  REFERRING MD:  EDP  CHIEF COMPLAINT:  Altered mental status, Elevated BS  HISTORY OF PRESENT ILLNESS:   47 year old with history of hypertension, lupus, goiter, paranoid schizophrenia, diabetes went to [facility] ED with altered mental status.  EMS called by family as she was sick for 3 days and lying on the couch.  Patient had seizure activity with EMS and given 5 mg of Versed. Found to have hypoglycemia with blood sugars of 1556, anion gap acidosis, elevated lactic acid. Intubated for altered mental status in the ED and transferred to [facility].  As per the family she did not have any fevers, nausea, vomiting, diarrhea, pain prior to this.  They are not aware that she has diabetes but she does have metformin on her med list.  There are no prior history of seizures.  She had not been taking any of her other medications except psychiatric medications for the past 2 to 3 months.  Family has noticed that she was having excessive urination over the past few weeks.  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  PAST SURGICAL HISTORY: She  has a past surgical history that includes Supracervical abdominal hysterectomy (09/27/2012); Hematoma evacuation (10/03/2012); Thyroidectomy (12/13/2012); and Abdominal hysterectomy.  No Known Allergies  No current facility-administered medications on file prior to encounter.    Current Outpatient Medications on File Prior to Encounter   Medication Sig  . amLODipine (NORVASC) 10 MG tablet Take 1 tablet (10 mg total) by mouth daily.  . benztropine (COGENTIN) 0.5 MG tablet Take 1 tablet (0.5 mg total) by mouth 2 (two) times daily.  . benztropine (COGENTIN) 0.5 MG tablet Take 1 tablet (0.5 mg total) by mouth 2 (two) times daily.  . clonazePAM (KLONOPIN) 0.5 MG tablet Take 1 tablet (0.5 mg total) by mouth 2 (two) times daily.  . divalproex (DEPAKOTE SPRINKLE) 125 MG capsule Take 8 capsules (1,000 mg total) by mouth at bedtime.  . hydrALAZINE (APRESOLINE) 50 MG tablet Take 1 tablet (50 mg total) by mouth every 8 (eight) hours.  . hydrochlorothiazide (HYDRODIURIL) 25 MG tablet Take 1 tablet (25 mg total) by mouth daily.  Marland Kitchen levothyroxine (SYNTHROID) 125 MCG tablet Take 1 tablet (125 mcg total) by mouth daily.  Marland Kitchen LORazepam (ATIVAN) 0.5 MG tablet Take 1 tablet (0.5 mg total) by mouth every 8 (eight) hours as needed for anxiety.  . metFORMIN (GLUCOPHAGE-XR) 500 MG 24 hr tablet Take 1 tablet (500 mg total) by mouth daily with breakfast.  . metoprolol (LOPRESSOR) 50 MG tablet Take 1 tablet (50 mg total) by mouth 2 (two) times daily.  . paliperidone (INVEGA SUSTENNA) 234 MG/1.5ML SUSP injection Inject 234 mg into the muscle every 28 (twenty-eight) days.  . risperiDONE (RISPERDAL M-TABS) 3 MG disintegrating tablet Take 1 tablet (3 mg total) by mouth at bedtime.  . traZODone (DESYREL) 100 MG tablet Take 1 tablet (100 mg total) by mouth at bedtime.  . traZODone (DESYREL) 100 MG tablet Take 1 tablet (100 mg total) by mouth at bedtime.  Marland Kitchen zolpidem (AMBIEN) 5 MG tablet Take 1 tablet (5  mg total) by mouth at bedtime as needed for sleep.    FAMILY HISTORY:  Her family history includes Cancer in her father and sister; Diabetes in her mother; Hypertension in her mother.  SOCIAL HISTORY: She  reports that she has never smoked. She has never used smokeless tobacco. She reports that she does not drink alcohol or use drugs.  REVIEW OF SYSTEMS:    Unable to obtain as patient is intubated  SUBJECTIVE:    VITAL SIGNS: BP (!) 115/59 (BP Location: Left Arm)   Pulse (!) 101   Temp 97.7 F (36.5 C) (Oral)   Resp 20   Ht 5\' 9"  (1.753 m)   Wt 283 lb 1.1 oz (128.4 kg)   LMP 09/18/2012   SpO2 100%   BMI 41.80 kg/m   HEMODYNAMICS:    VENTILATOR SETTINGS: Vent Mode: PRVC FiO2 (%):  [60 %-100 %] 60 % Set Rate:  [15 bmp-20 bmp] 20 bmp Vt Set:  [530 mL] 530 mL PEEP:  [0 cmH20-5 cmH20] 5 cmH20 Plateau Pressure:  [15 cmH20-17 cmH20] 17 cmH20  INTAKE / OUTPUT: No intake/output data recorded.  PHYSICAL EXAMINATION: Blood pressure (!) 115/59, pulse (!) 101, temperature 97.7 F (36.5 C), temperature source Oral, resp. rate 20, height 5\' 9"  (1.753 m), weight 283 lb 1.1 oz (128.4 kg), last menstrual period 09/18/2012, SpO2 100 %. Gen:      No acute distress, obese HEENT:  EOMI, sclera anicteric, bilateral exophthalmos.  ET tube in place Neck:     No masses; no thyromegaly Lungs:    Clear to auscultation bilaterally; normal respiratory effort CV:         Regular rate and rhythm; no murmurs Abd:      + bowel sounds; soft, non-tender; no palpable masses, no distension Ext:    No edema; adequate peripheral perfusion Skin:      Warm and dry; no rash Neuro: Sedated, unresponsive  LABS:  BMET Recent Labs  Lab 07/24/18 1021 07/24/18 1028  NA 139 140  K 5.6* 5.7*  CL 96* 101  CO2 18*  --   BUN 95* 93*  CREATININE 7.72* 6.70*  GLUCOSE 1,556* >700*    Electrolytes Recent Labs  Lab 07/24/18 1021  CALCIUM 9.3    CBC Recent Labs  Lab 07/24/18 1021 07/24/18 1028  WBC 11.5*  --   HGB 14.2 16.3*  HCT 54.8* 48.0*  PLT 250  --     Coag's Recent Labs  Lab 07/24/18 1021  INR 1.14    Sepsis Markers Recent Labs  Lab 07/24/18 1103  LATICACIDVEN 4.2*    ABG Recent Labs  Lab 07/24/18 1200  PHART 7.232*  PCO2ART 34.4  PO2ART 228*    Liver Enzymes Recent Labs  Lab 07/24/18 1021  AST 29  ALT 45*   ALKPHOS 118  BILITOT 1.5*  ALBUMIN 3.5    Cardiac Enzymes Recent Labs  Lab 07/24/18 1021  TROPONINI <0.03    Glucose Recent Labs  Lab 07/24/18 1017 07/24/18 1249 07/24/18 1502  GLUCAP >600* >600* >600*    Imaging Ct Head Wo Contrast  Result Date: 07/24/2018 CLINICAL DATA:  Altered mental status, unclear cause. Seizure activity. Patient responsive only to painful stimuli. Hyperglycemia. EXAM: CT HEAD WITHOUT CONTRAST TECHNIQUE: Contiguous axial images were obtained from the base of the skull through the vertex without intravenous contrast. COMPARISON:  None. FINDINGS: Brain: No acute infarct, hemorrhage, or mass lesion is present. The ventricles are of normal size. No significant white matter disease is  present. The brainstem and cerebellum are within normal limits. No significant extraaxial fluid collection is present. Vascular: No hyperdense vessel or unexpected calcification. Skull: Calvarium is intact. No acute or healing fractures are present. No focal lytic or blastic lesions are present. Sinuses/Orbits: The paranasal sinuses and mastoid air cells are clear. Bilateral exophthalmos is noted. No retro-orbital mass is present. Globes and orbits are otherwise within normal limits. IMPRESSION: 1. Normal CT appearance of brain. 2. Bilateral exophthalmos.  Question thyroid disease. Electronically Signed   By: San Morelle M.D.   On: 07/24/2018 12:58   Dg Chest Port 1 View  Addendum Date: 07/24/2018   ADDENDUM REPORT: 07/24/2018 11:19 ADDENDUM: These results were called by telephone at the time of interpretation on 07/24/2018 at 11:13 am to Dr. Francine Graven , who verbally acknowledged these results. Electronically Signed   By: Fidela Salisbury M.D.   On: 07/24/2018 11:19   Result Date: 07/24/2018 CLINICAL DATA:  Post intubation. EXAM: PORTABLE CHEST 1 VIEW COMPARISON:  Body CT 09/30/2012 FINDINGS: The endotracheal tube terminates in the proximal right mainstem bronchus.  Retraction by approximately 3 cm is recommended. Enteric catheter tip overlies the expected location of gastric cardia. Cardiomediastinal silhouette is normal. Mediastinal contours appear intact. There is no evidence of pneumothorax. Low lung volumes with increased interstitial markings. Elevation of the left hemidiaphragm suggestive of atelectatic changes of the left lung. Osseous structures are without acute abnormality. Soft tissues are grossly normal. IMPRESSION: Right mainstem bronchus intubation. Suggested retraction of the endotracheal tube by approximately 3 cm. Low lung volumes bilaterally with more prominent atelectatic changes of the left lung. Electronically Signed: By: Fidela Salisbury M.D. On: 07/24/2018 11:07    STUDIES:   CULTURES:  ANTIBIOTICS:  SIGNIFICANT EVENTS:  LINES/TUBES: ETT 7/27 >  DISCUSSION: 47 year old with history of schizophrenia, goiter diabetes admitted with DKA due to non compliance with meds  ASSESSMENT / PLAN:  PULMONARY A: Intubated due to altered mental status P:   Continue vent support Follow chest x-ray, ABG  CARDIOVASCULAR A:  Hypotension, lactic acidosis P:  Fluid resuscitate.  Follow lactic acid.  Check troponin, CK  RENAL A:   AKI, creatinine 7.72 Hyperkalemia P:   Fluid resuscitate, monitor urine output and creatinine Renal u;trasound if there is no improvement with hydration  GASTROINTESTINAL A:   Stable P:   N.p.o., PPI for ulcer prophylaxis  HEMATOLOGIC A:   Elevated hematocrit secondary to dehydration P:  Follow CBC  INFECTIOUS A:   No evidence of infection P:   Check cultures, procalcitonin. Observe off antibiotics.  ENDOCRINE A:   Admitted with DKA Goiter, exophthalmos P:   Insulin drip, DKA protocol Continue Synthroid, check TSH Hold metformin.  NEUROLOGIC A:   Encephalopathy, seizures CT head is unremarkable. P:   Likely secondary to DKA, acidosis Check EEG.  Monitor neuro  status.  FAMILY  - Updates: Family updated at bedside - Inter-disciplinary family meet or Palliative Care meeting due by:    The patient is critically ill with multiple organ system failure and requires high complexity decision making for assessment and support, frequent evaluation and titration of therapies, advanced monitoring, review of radiographic studies and interpretation of complex data.   Critical Care Time devoted to patient care services, exclusive of separately billable procedures, described in this note is 35 minutes.   Marshell Garfinkel MD [facility] 07/24/2018, 4:04 PM

## 2018-07-24 NOTE — ED Notes (Signed)
Date and time results received: 07/24/18 1030 (use smartphrase ".now" to insert current time)  Test: glucose hb  Critical Value: >700, 16.3,   Name of Provider Notified: [email or web address]  Orders Received? Or Actions Taken?: see orders

## 2018-07-24 NOTE — ED Notes (Signed)
Mouth care done prior to intubation.  Minimal gag noted.

## 2018-07-24 NOTE — ED Triage Notes (Signed)
Pt brought in by ems after family called for "sick".  Pt had seizure activity with ems and was given Versed 5mg  nasally.  Pt responds to painful stimuli on arrival to ED.  Old medications brought in with pt from over a year ago.  Glucose reads high.

## 2018-07-24 NOTE — Progress Notes (Signed)
LAC PIV noted to be swollen and red. PIV removed, and warm compress applied. Will continue to monitor.

## 2018-07-24 NOTE — Progress Notes (Signed)
[place] notified of elevated Lactic acid@ 3.6. Will continue to monitor.

## 2018-07-24 NOTE — ED Notes (Signed)
Pt returned from ct bs >600 insulin gtt increased to 10.8 units/hr. Family at bedside at this time.

## 2018-07-24 NOTE — ED Notes (Signed)
CBG >600 per CBG

## 2018-07-24 NOTE — ED Notes (Signed)
Date and time results received: 07/24/18 11:43 AM  (use smartphrase ".now" to insert current time)  Test: Lactic  Critical Value: 4.2  Name of Provider Notified: Thurnell Garbe  Orders Received? Or Actions Taken?: Orders Received - See Orders for details

## 2018-07-24 NOTE — Code Documentation (Signed)
Pt being preoxygenated with 100% NRB.

## 2018-07-24 NOTE — Code Documentation (Signed)
Pt will have transient periods of responsiveness and will attempt to sit up, but does not follow commands.

## 2018-07-24 NOTE — Code Documentation (Signed)
Upon intubation with glidescope, noted copious amt of dried oral secretions caked into back of throat.  Does not appear to be food particles.

## 2018-07-24 NOTE — Progress Notes (Signed)
PT transferred from [facility] on ventilator on arrival

## 2018-07-24 NOTE — ED Provider Notes (Signed)
[facility] Provider Note   CSN: [id number] Arrival date & time: 07/24/18  1015     History   Chief Complaint Chief Complaint  Patient presents with  . Altered Mental Status    HPI [name] is a 47 y.o. female.  The history is provided by the EMS personnel. The history is limited by the condition of the patient (AMS).  Altered Mental Status    Pt was seen at 1010. Pt seen on arrival to ED exam room. EMS states they were called to scene for "sick call." Pt was found by family with AMS "today" and "was OK yesterday." EMS noted pt to have head and UE's "shaking" en route, gave versed 62m nasally with good effect. CBG read "high." Pt's meds (labels from last year) transported with pt. Pt unresponsive on arrival to ED, but will intermittently lift her head up and look around.   Past Medical History:  Diagnosis Date  . Anemia   . 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 Delusional disorder ([place]   . DVT (deep venous thrombosis) ([place] 09/30/12   in upper extremities b/l and LLE  . Fibroid tumor   . Graves disease   . 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 JArnoldo Moraleon 12/13/2012.  .Marland KitchenHypertension   . Lupus anticoagulant positive 10/02/2012  . Multinodular goiter 10/01/2012  . Paranoid schizophrenia ([place]   . Thyroid disease   . Thyroid nodule    s/p biopsy    Patient Active Problem List   Diagnosis Date Noted  . DKA (diabetic ketoacidosis) ([place] 07/24/2018  . Paranoid schizophrenia ([place] 07/20/2014  . 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 Contact dermatitis 01/18/2013  . Vaginitis 01/18/2013  . Essential hypertension, benign 12/24/2012  . Heartburn 12/24/2012  . Hypothyroidism 12/24/2012  . Lupus anticoagulant positive 10/02/2012  . Acute DVT (deep venous thrombosis) ([place] 09/27/2012  . Chronic kidney disease 08/28/2012  . Fibroid uterus  06/12/2012    Past Surgical History:  Procedure Laterality Date  . ABDOMINAL HYSTERECTOMY    . HEMATOMA EVACUATION  10/03/2012   Procedure: EVACUATION HEMATOMA;  Surgeon: JJonnie Kind MD;  Location: [facility];  Service: Gynecology;  Laterality: N/A;  . SUPRACERVICAL ABDOMINAL HYSTERECTOMY  09/27/2012   Procedure: HYSTERECTOMY SUPRACERVICAL ABDOMINAL;  Surgeon: JJonnie Kind MD;  Location: [facility];  Service: Gynecology;  Laterality: N/A;  . THYROIDECTOMY  12/13/2012   Procedure: THYROIDECTOMY;  Surgeon: MJamesetta So MD;  Location: [facility];  Service: General;  Laterality: Bilateral;     OB History   None      Home Medications    Prior to Admission medications   Medication Sig Start Date End Date Taking? Authorizing Provider  amLODipine (NORVASC) 10 MG tablet Take 1 tablet (10 mg total) by mouth daily. 06/11/15   DNiel Hummer NP  benztropine (COGENTIN) 0.5 MG tablet Take 1 tablet (0.5 mg total) by mouth 2 (two) times daily. 06/11/15   DNiel Hummer NP  benztropine (COGENTIN) 0.5 MG tablet Take 1 tablet (0.5 mg total) by mouth 2 (two) times daily. 09/11/15   Pucilowska, JHerma ArdB, MD  clonazePAM (KLONOPIN) 0.5 MG tablet Take 1 tablet (0.5 mg total) by mouth 2 (two) times daily. 06/11/15   DNiel Hummer NP  divalproex (DEPAKOTE SPRINKLE) 125 MG capsule Take 8 capsules (1,000 mg total) by mouth at bedtime. 09/11/15  Pucilowska, Jolanta B, MD  hydrALAZINE (APRESOLINE) 50 MG tablet Take 1 tablet (50 mg total) by mouth every 8 (eight) hours. 06/11/15   Niel Hummer, NP  hydrochlorothiazide (HYDRODIURIL) 25 MG tablet Take 1 tablet (25 mg total) by mouth daily. 06/11/15   Niel Hummer, NP  levothyroxine (SYNTHROID) 125 MCG tablet Take 1 tablet (125 mcg total) by mouth daily. 06/11/15   Niel Hummer, NP  LORazepam (ATIVAN) 0.5 MG tablet Take 1 tablet (0.5 mg total) by mouth every 8 (eight) hours as needed for anxiety. 09/11/15   Pucilowska, Wardell Honour, MD  metFORMIN (GLUCOPHAGE-XR) 500 MG  24 hr tablet Take 1 tablet (500 mg total) by mouth daily with breakfast. 06/11/15   Niel Hummer, NP  metoprolol (LOPRESSOR) 50 MG tablet Take 1 tablet (50 mg total) by mouth 2 (two) times daily. 06/11/15   Niel Hummer, NP  paliperidone (INVEGA SUSTENNA) 234 MG/1.5ML SUSP injection Inject 234 mg into the muscle every 28 (twenty-eight) days. 10/08/15   Pucilowska, Jolanta B, MD  risperiDONE (RISPERDAL M-TABS) 3 MG disintegrating tablet Take 1 tablet (3 mg total) by mouth at bedtime. 09/11/15   Pucilowska, Herma Ard B, MD  traZODone (DESYREL) 100 MG tablet Take 1 tablet (100 mg total) by mouth at bedtime. 06/11/15   Niel Hummer, NP  traZODone (DESYREL) 100 MG tablet Take 1 tablet (100 mg total) by mouth at bedtime. 09/10/15   Pucilowska, Herma Ard B, MD  zolpidem (AMBIEN) 5 MG tablet Take 1 tablet (5 mg total) by mouth at bedtime as needed for sleep. 09/10/15   Clovis Fredrickson, MD    Family History Family History  Problem Relation Age of Onset  . Diabetes Mother   . Hypertension Mother   . Cancer Father        prostate   . Cancer Sister        pancreatic cancer    Social History Social History   Tobacco Use  . Smoking status: Never Smoker  . Smokeless tobacco: Never Used  Substance Use Topics  . Alcohol use: No  . Drug use: No     Allergies   Patient has no known allergies.   Review of Systems Review of Systems  Unable to perform ROS: Mental status change     Physical Exam Updated Vital Signs BP (!) 115/59 (BP Location: Left Arm)   Pulse (!) 101   Temp 97.7 F (36.5 C) (Oral)   Resp 20   Ht _0  (1.753 m)   Wt 124.7 kg (275 lb)   LMP 09/18/2012   SpO2 100%   BMI 40.61 kg/m   Patient Vitals for the past 24 hrs:  BP Temp Temp src Pulse Resp SpO2 Height Weight  07/24/18 1507 (!) 115/59 97.7 F (36.5 C) Oral (!) 101 20 100 % _1  (1.753 m) 128.4 kg (283 lb 1.1 oz)  07/24/18 1459 - - - - - 99 % - -  07/24/18 1315 (!) 108/45 97.7 F (36.5 C) - - (!) 24 - -  -  07/24/18 1300 (!) 114/49 97.7 F (36.5 C) - (!) 106 (!) 41 (!) 72 % - -  07/24/18 1245 (!) 116/52 97.7 F (36.5 C) - (!) 110 (!) 32 100 % - -  07/24/18 1230 - (!) 97.5 F (36.4 C) - - (!) 23 - - -  07/24/18 1215 (!) 85/40 97.9 F (36.6 C) - - 19 - - -  07/24/18 1200 (!) 102/39 97.9 F (36.6  C) - (!) 109 17 100 % - -  07/24/18 1145 (!) 97/43 98.2 F (36.8 C) - - (!) 26 - - -  07/24/18 1130 (!) 85/44 98.2 F (36.8 C) - (!) 106 18 100 % - -  07/24/18 1115 (!) 119/51 98.4 F (36.9 C) - - (!) 26 - - -  07/24/18 1105 (!) 88/66 98.4 F (36.9 C) - - 18 - - -  07/24/18 1053 - - - - - - _0  (1.753 m) -  07/24/18 1052 92/64 98.6 F (37 C) - (!) 107 (!) 24 100 % - -  07/24/18 1046 101/79 98.6 F (37 C) - (!) 113 (!) 24 98 % - -  07/24/18 1030 (!) 106/92 98.4 F (36.9 C) - (!) 112 (!) 28 95 % - -  07/24/18 1020 - - - - - - _1  (1.753 m) 124.7 kg (275 lb)     Physical Exam 1015: Physical examination:  Nursing notes reviewed; Vital signs and O2 SAT reviewed;  Constitutional: Well developed, Well nourished, In no acute distress; Head:  Normocephalic, atraumatic; Eyes: EOMI, PERRL, No scleral icterus. +mild conjunctival injection bilat. +exopthalmos bilat..; ENMT: Mouth and pharynx normal, Mucous membranes very dry and cracked; Neck: Supple, Full range of motion, No lymphadenopathy; Cardiovascular: Tachycardic rate and rhythm, No gallop; Respiratory: Breath sounds clear & equal bilaterally, No wheezes. Normal respiratory effort/excursion; Chest: Nontender, Movement normal; Abdomen: Soft, large. Nontender, Nondistended, Normal bowel sounds; Genitourinary: No CVA tenderness; Extremities: Peripheral pulses normal, No deformity.  No edema, No calf edema or asymmetry.; Neuro:  Laying eyes closed. No facial droop. Moves all extremities spontaneously on stretcher..; Skin: Color normal, Warm, Dry.   ED Treatments / Results  Labs (all labs ordered are listed, but only abnormal results are  displayed)   EKG EKG Interpretation  Date/Time:  Saturday July 24 2018 10:21:39 EDT Ventricular Rate:  115 PR Interval:    QRS Duration: 77 QT Interval:  295 QTC Calculation: 408 R Axis:   30 Text Interpretation:  Sinus tachycardia Nonspecific T abnormalities, inferior leads When compared with ECG of 06/05/2015 Rate faster Confirmed by Francine Graven [phone]) on 07/24/2018 10:58:51 AM   Radiology   Procedures Procedures (including critical care time)   Airway procedure:  Timeout: Pre-procedure timeout not performed due to emergent nature of procedure; Indication: Unresponsive;  Oxygen Saturation: 100 %; Oxygen concentration: 100 %; Preoxygenation: Bag-valve-mask;  Medication: Ativan, Etomidate; Procedure: Suctioning, Glidescope laryngoscopy, Endotracheal intubation with 7.45m cuffed endotracheal tube, Bag-valve-tube ventilation, Mechanical ventilation;  Reassessment: Successful intubation, Oral mucosa dry and cracking.  No bleeding or obvious trauma in oral cavity or posterior pharynx. Teeth intact, without obvious trauma. Breath sounds equal bilaterally, No breath sounds heard over stomach, Chest movement symmetrical, CO2 detector color change, Endotracheal tube fogging, Oxygen saturation normal. Post-procedure xray obtained.     Medications Ordered in ED Medications  LORazepam (ATIVAN) 2 MG/ML injection (  Not Given 07/24/18 1029)  dextrose 5 %-0.45 % sodium chloride infusion ( Intravenous Not Given 07/24/18 1100)  insulin regular (NOVOLIN R,HUMULIN R) 100 Units in sodium chloride 0.9 % 100 mL (1 Units/mL) infusion (10.5 Units/hr Intravenous Rate/Dose Change 07/24/18 1300)  dextrose 50 % solution 25 mL (has no administration in time range)  0.9 %  sodium chloride infusion ( Intravenous New Bag/Given 07/24/18 1318)  midazolam (VERSED) 50 mg in sodium chloride 0.9 % 50 mL (1 mg/mL) infusion (8 mg/hr Intravenous Rate/Dose Change 07/24/18 1500)  midazolam (VERSED) bolus via infusion  1-2  mg (has no administration in time range)  fentaNYL (SUBLIMAZE) injection 100 mcg (has no administration in time range)  fentaNYL (SUBLIMAZE) injection 100 mcg (has no administration in time range)  pantoprazole (PROTONIX) 40 MG injection (  Not Given 07/24/18 1115)  chlorhexidine gluconate (MEDLINE KIT) (PERIDEX) 0.12 % solution 15 mL (has no administration in time range)  MEDLINE mouth rinse (has no administration in time range)  naloxone [facility]) injection 0.4 mg (0.4 mg Intravenous Given 07/24/18 1025)  levETIRAcetam (KEPPRA) IVPB 1500 mg/ 100 mL premix (0 mg Intravenous Stopped 07/24/18 1137)  sodium chloride 0.9 % bolus 2,000 mL (0 mLs Intravenous Stopped 07/24/18 1301)  LORazepam (ATIVAN) 2 MG/ML injection (2 mg  Given 07/24/18 1041)  etomidate (AMIDATE) injection (20 mg Intravenous Given 07/24/18 1042)  propofol (DIPRIVAN) 1000 MG/100ML infusion (0 mcg/kg/min  124.7 kg Intravenous Stopped 07/24/18 1109)  pantoprazole (PROTONIX) injection 40 mg (40 mg Intravenous Given 07/24/18 1113)     Initial Impression / Assessment and Plan / ED Course  I have reviewed the triage vital signs and the nursing notes.  Pertinent labs & imaging results that were available during my care of the patient were reviewed by me and considered in my medical decision making (see chart for details).  MDM Reviewed: previous chart, nursing note and vitals Reviewed previous: labs and ECG Interpretation: labs, ECG, x-ray and CT scan Total time providing critical care: 75-105 minutes. This excludes time spent performing separately reportable procedures and services. Consults: critical care    CRITICAL CARE Performed by: Alfonzo Feller Total critical care time: 90 minutes Critical care time was exclusive of separately billable procedures and treating other patients. Critical care was necessary to treat or prevent imminent or life-threatening deterioration. Critical care was time spent personally by me on the  following activities: development of treatment plan with patient and/or surrogate as well as nursing, discussions with consultants, evaluation of patient's response to treatment, examination of patient, obtaining history from patient or surrogate, ordering and performing treatments and interventions, ordering and review of laboratory studies, ordering and review of radiographic studies, pulse oximetry and re-evaluation of patient's condition.   Results for orders placed or performed during the hospital encounter of 07/24/18  Acetaminophen level  Result Value Ref Range   Acetaminophen (Tylenol), Serum <10 (L) 10 - 30 ug/mL  Comprehensive metabolic panel  Result Value Ref Range   Sodium 139 135 - 145 mmol/L   Potassium 5.6 (H) 3.5 - 5.1 mmol/L   Chloride 96 (L) 98 - 111 mmol/L   CO2 18 (L) 22 - 32 mmol/L   Glucose, Bld 1,556 (HH) 70 - 99 mg/dL   BUN 95 (H) 6 - 20 mg/dL   Creatinine, Ser 7.72 (H) 0.44 - 1.00 mg/dL   Calcium 9.3 8.9 - 10.3 mg/dL   Total Protein 7.9 6.5 - 8.1 g/dL   Albumin 3.5 3.5 - 5.0 g/dL   AST 29 15 - 41 U/L   ALT 45 (H) 0 - 44 U/L   Alkaline Phosphatase 118 38 - 126 U/L   Total Bilirubin 1.5 (H) 0.3 - 1.2 mg/dL   GFR calc non Af Amer 5 (L) >60 mL/min   GFR calc Af Amer 6 (L) >60 mL/min   Anion gap >20 (H) 5 - 15  Ethanol  Result Value Ref Range   Alcohol, Ethyl (B) <10 <10 mg/dL  Troponin I  Result Value Ref Range   Troponin I <7.94 <8.01 ng/mL  Salicylate level  Result Value Ref Range  Salicylate Lvl <2.3 2.8 - 30.0 mg/dL  Lactic acid, plasma  Result Value Ref Range   Lactic Acid, Venous 4.2 (HH) 0.5 - 1.9 mmol/L  CBC with Differential  Result Value Ref Range   WBC 11.5 (H) 4.0 - 10.5 K/uL   RBC 5.11 3.87 - 5.11 MIL/uL   Hemoglobin 14.2 12.0 - 15.0 g/dL   HCT 54.8 (H) 36.0 - 46.0 %   MCV 107.2 (H) 78.0 - 100.0 fL   MCH 27.8 26.0 - 34.0 pg   MCHC 25.9 (L) 30.0 - 36.0 g/dL   RDW 18.4 (H) 11.5 - 15.5 %   Platelets 250 150 - 400 K/uL   Neutrophils  Relative % 86 %   Neutro Abs 9.9 (H) 1.7 - 7.7 K/uL   Lymphocytes Relative 8 %   Lymphs Abs 0.9 0.7 - 4.0 K/uL   Monocytes Relative 6 %   Monocytes Absolute 0.7 0.1 - 1.0 K/uL   Eosinophils Relative 0 %   Eosinophils Absolute 0.0 0.0 - 0.7 K/uL   Basophils Relative 0 %   Basophils Absolute 0.0 0.0 - 0.1 K/uL  Protime-INR  Result Value Ref Range   Prothrombin Time 14.5 11.4 - 15.2 seconds   INR 1.14   Valproic acid level  Result Value Ref Range   Valproic Acid Lvl <10 (L) 50.0 - 100.0 ug/mL  Urine rapid drug screen (hosp performed)  Result Value Ref Range   Opiates NONE DETECTED NONE DETECTED   Cocaine NONE DETECTED NONE DETECTED   Benzodiazepines NONE DETECTED NONE DETECTED   Amphetamines NONE DETECTED NONE DETECTED   Tetrahydrocannabinol NONE DETECTED NONE DETECTED   Barbiturates NONE DETECTED NONE DETECTED  Urinalysis, Routine w reflex microscopic  Result Value Ref Range   Color, Urine YELLOW YELLOW   APPearance HAZY (A) CLEAR   Specific Gravity, Urine 1.023 1.005 - 1.030   pH 5.0 5.0 - 8.0   Glucose, UA >=500 (A) NEGATIVE mg/dL   Hgb urine dipstick SMALL (A) NEGATIVE   Bilirubin Urine NEGATIVE NEGATIVE   Ketones, ur 5 (A) NEGATIVE mg/dL   Protein, ur 30 (A) NEGATIVE mg/dL   Nitrite NEGATIVE NEGATIVE   Leukocytes, UA TRACE (A) NEGATIVE   RBC / HPF 0-5 0 - 5 RBC/hpf   WBC, UA 6-10 0 - 5 WBC/hpf   Bacteria, UA RARE (A) NONE SEEN   Squamous Epithelial / LPF 0-5 0 - 5   Mucus PRESENT    Hyaline Casts, UA PRESENT   Blood gas, arterial  Result Value Ref Range   FIO2 100.00    Delivery systems VENTILATOR    Mode PRESSURE REGULATED VOLUME CONTROL    VT 530 mL   LHR 15 resp/min   Peep/cpap 5.0 cm H20   pH, Arterial 7.232 (L) 7.350 - 7.450   pCO2 arterial 34.4 32.0 - 48.0 mmHg   pO2, Arterial 228 (H) 83.0 - 108.0 mmHg   Bicarbonate 15.0 (L) 20.0 - 28.0 mmol/L   Acid-base deficit 12.1 (H) 0.0 - 2.0 mmol/L   O2 Saturation 99.1 %   Patient temperature 37.0     Collection site LEFT RADIAL    Drawn by [id number]    Sample type ARTERIAL DRAW    Allens test (pass/fail) PASS PASS  Glucose, capillary  Result Value Ref Range   Glucose-Capillary >600 (HH) 70 - 99 mg/dL  CBG monitoring, ED  Result Value Ref Range   Glucose-Capillary >600 (HH) 70 - 99 mg/dL  I-stat Chem 8, ED  Result Value Ref Range  Sodium 140 135 - 145 mmol/L   Potassium 5.7 (H) 3.5 - 5.1 mmol/L   Chloride 101 98 - 111 mmol/L   BUN 93 (H) 6 - 20 mg/dL   Creatinine, Ser 6.70 (H) 0.44 - 1.00 mg/dL   Glucose, Bld >700 (HH) 70 - 99 mg/dL   Calcium, Ion 1.20 1.15 - 1.40 mmol/L   TCO2 20 (L) 22 - 32 mmol/L   Hemoglobin 16.3 (H) 12.0 - 15.0 g/dL   HCT 48.0 (H) 36.0 - 46.0 %  POC occult blood, ED  Result Value Ref Range   Fecal Occult Bld POSITIVE (A) NEGATIVE  CBG monitoring, ED  Result Value Ref Range   Glucose-Capillary >600 (HH) 70 - 99 mg/dL   Ct Head Wo Contrast Result Date: 07/24/2018 CLINICAL DATA:  Altered mental status, unclear cause. Seizure activity. Patient responsive only to painful stimuli. Hyperglycemia. EXAM: CT HEAD WITHOUT CONTRAST TECHNIQUE: Contiguous axial images were obtained from the base of the skull through the vertex without intravenous contrast. COMPARISON:  None. FINDINGS: Brain: No acute infarct, hemorrhage, or mass lesion is present. The ventricles are of normal size. No significant white matter disease is present. The brainstem and cerebellum are within normal limits. No significant extraaxial fluid collection is present. Vascular: No hyperdense vessel or unexpected calcification. Skull: Calvarium is intact. No acute or healing fractures are present. No focal lytic or blastic lesions are present. Sinuses/Orbits: The paranasal sinuses and mastoid air cells are clear. Bilateral exophthalmos is noted. No retro-orbital mass is present. Globes and orbits are otherwise within normal limits. IMPRESSION: 1. Normal CT appearance of brain. 2. Bilateral exophthalmos.   Question thyroid disease. Electronically Signed   By: San Morelle M.D.   On: 07/24/2018 12:58   Dg Chest Port 1 View Addendum Date: 07/24/2018   ADDENDUM REPORT: 07/24/2018 11:19 ADDENDUM: These results were called by telephone at the time of interpretation on 07/24/2018 at 11:13 am to Dr. Francine Graven , who verbally acknowledged these results. Electronically Signed   By: Fidela Salisbury M.D.   On: 07/24/2018 11:19   Result Date: 07/24/2018 CLINICAL DATA:  Post intubation. EXAM: PORTABLE CHEST 1 VIEW COMPARISON:  Body CT 09/30/2012 FINDINGS: The endotracheal tube terminates in the proximal right mainstem bronchus. Retraction by approximately 3 cm is recommended. Enteric catheter tip overlies the expected location of gastric cardia. Cardiomediastinal silhouette is normal. Mediastinal contours appear intact. There is no evidence of pneumothorax. Low lung volumes with increased interstitial markings. Elevation of the left hemidiaphragm suggestive of atelectatic changes of the left lung. Osseous structures are without acute abnormality. Soft tissues are grossly normal. IMPRESSION: Right mainstem bronchus intubation. Suggested retraction of the endotracheal tube by approximately 3 cm. Low lung volumes bilaterally with more prominent atelectatic changes of the left lung. Electronically Signed: By: Fidela Salisbury M.D. On: 07/24/2018 11:07    1030:  I-stat chem with glucose >700; IVF boluses ordered. Pt continued to have intermittent short episodes of seizure activity: shaking of bilat UE's and head only, no movement bilat LE's. Pt will then open her eyes and try to sit up, pull at IV's, etc. Pt will then lay there for short time, then cycle repeats. IV keppra loading dose ordered.  IV ativan given with IV etomidate for intubation with good results. Pt intubated without difficulty.   1100:  OGT passed with brown-black drainage. Stool OB is positive. IV protonix bolus given. IV insulin gtt  ordered.   1115:  IV propofol given with good sedative effect, but  pt's BP will drop. BP recovers when IV propofol held. Will change sedation to IV versed gtt and IV fentanyl boluses.   1125:  T/C returned from [place] Dr. Debbora Dus, case discussed, including:  HPI, pertinent PM/SHx, VS/PE, dx testing, ED course and treatment:  Agreeable to accept transfer/admit.   1135:  Pt's family is here now:  They state pt has been unresponsive for the past 3 days, began to "shake" today, which is why they called EMS. Pt also has been "very thirsty and urinating a lot" over the past 1 week. Pt does not have hx DM. Medical records received from PMD's office: pt was started on lisinopril and HCTA last fall. This likely contributing to AKI. IVF NS continues, with IV insulin gtt, IV versed gtt and IV fentanyl boluses. CXR as above: ETT repositioned.   1200:  CBG continues elevated; IV insulin gtt continues to be titrated. Pt having intermittent RUE and head "shaking." IV versed bolus given with good effect. The patient is noted to have a lactate>4. With the current information available to me, I don't think the patient is in septic shock. The lactate>4, is related to seizure activity, DKA, AKI.  1300:  Pt continues intermittently agitated, attempting to lift head up, spontaneously moving all extremities on stretcher. IV sedation and analgesia continue. BP will dip after IV meds given, then recover quickly. Resps without distress on vent, Sats 98-100%.  1330:  [facility] to [facility] to [facility].      Final Clinical Impressions(s) / ED Diagnoses   Final diagnoses:  Altered mental status, unspecified altered mental status type  Diabetic ketoacidosis with coma associated with other specified diabetes mellitus ([place])  AKI (acute kidney injury) ([place])  Status epilepticus ([place])  Dehydration  Occult GI bleeding  Upper GI bleed  Acute respiratory failure, unspecified whether with hypoxia or hypercapnia [facility])     ED Discharge Orders    None       Francine Graven, DO 07/26/18 1218

## 2018-07-24 NOTE — Progress Notes (Signed)
Lynn notified of K=5.5 Will continue to monitor.

## 2018-07-24 NOTE — ED Notes (Signed)
Date and time results received: 07/24/18 11:30 AM  (use smartphrase ".now" to insert current time)  Test: Glucose Critical Value: 1556  Name of Provider Notified: Thurnell Garbe  Orders Received? Or Actions Taken?: Orders Received - See Orders for details

## 2018-07-25 ENCOUNTER — Inpatient Hospital Stay (HOSPITAL_COMMUNITY): Payer: BLUE CROSS/BLUE SHIELD

## 2018-07-25 ENCOUNTER — Other Ambulatory Visit: Payer: Self-pay

## 2018-07-25 ENCOUNTER — Other Ambulatory Visit (HOSPITAL_COMMUNITY): Payer: Self-pay

## 2018-07-25 LAB — CBC
HEMATOCRIT: 43.4 % (ref 36.0–46.0)
Hemoglobin: 13.1 g/dL (ref 12.0–15.0)
MCH: 26.7 pg (ref 26.0–34.0)
MCHC: 30.2 g/dL (ref 30.0–36.0)
MCV: 88.4 fL (ref 78.0–100.0)
Platelets: 178 10*3/uL (ref 150–400)
RBC: 4.91 MIL/uL (ref 3.87–5.11)
RDW: 16.8 % — ABNORMAL HIGH (ref 11.5–15.5)
WBC: 8.6 10*3/uL (ref 4.0–10.5)

## 2018-07-25 LAB — GLUCOSE, CAPILLARY
GLUCOSE-CAPILLARY: 136 mg/dL — AB (ref 70–99)
GLUCOSE-CAPILLARY: 139 mg/dL — AB (ref 70–99)
GLUCOSE-CAPILLARY: 229 mg/dL — AB (ref 70–99)
GLUCOSE-CAPILLARY: 254 mg/dL — AB (ref 70–99)
GLUCOSE-CAPILLARY: 272 mg/dL — AB (ref 70–99)
GLUCOSE-CAPILLARY: 249 mg/dL — AB (ref 70–99)
GLUCOSE-CAPILLARY: 176 mg/dL — AB (ref 70–99)
GLUCOSE-CAPILLARY: 169 mg/dL — AB (ref 70–99)
GLUCOSE-CAPILLARY: 144 mg/dL — AB (ref 70–99)
GLUCOSE-CAPILLARY: 151 mg/dL — AB (ref 70–99)
GLUCOSE-CAPILLARY: 129 mg/dL — AB (ref 70–99)
GLUCOSE-CAPILLARY: 200 mg/dL — AB (ref 70–99)
Glucose-Capillary: 125 mg/dL — ABNORMAL HIGH (ref 70–99)
Glucose-Capillary: 128 mg/dL — ABNORMAL HIGH (ref 70–99)
Glucose-Capillary: 142 mg/dL — ABNORMAL HIGH (ref 70–99)
Glucose-Capillary: 179 mg/dL — ABNORMAL HIGH (ref 70–99)
Glucose-Capillary: 192 mg/dL — ABNORMAL HIGH (ref 70–99)
Glucose-Capillary: 193 mg/dL — ABNORMAL HIGH (ref 70–99)
Glucose-Capillary: 240 mg/dL — ABNORMAL HIGH (ref 70–99)
Glucose-Capillary: 274 mg/dL — ABNORMAL HIGH (ref 70–99)
Glucose-Capillary: 286 mg/dL — ABNORMAL HIGH (ref 70–99)
Glucose-Capillary: 294 mg/dL — ABNORMAL HIGH (ref 70–99)

## 2018-07-25 LAB — BASIC METABOLIC PANEL
ANION GAP: 10 (ref 5–15)
ANION GAP: 13 (ref 5–15)
BUN: 83 mg/dL — ABNORMAL HIGH (ref 6–20)
BUN: 81 mg/dL — ABNORMAL HIGH (ref 6–20)
CHLORIDE: 128 mmol/L — AB (ref 98–111)
CO2: 20 mmol/L — AB (ref 22–32)
CO2: 22 mmol/L (ref 22–32)
Calcium: 8.1 mg/dL — ABNORMAL LOW (ref 8.9–10.3)
Calcium: 8.2 mg/dL — ABNORMAL LOW (ref 8.9–10.3)
Chloride: 124 mmol/L — ABNORMAL HIGH (ref 98–111)
Creatinine, Ser: 6.85 mg/dL — ABNORMAL HIGH (ref 0.44–1.00)
Creatinine, Ser: 6.91 mg/dL — ABNORMAL HIGH (ref 0.44–1.00)
GFR calc Af Amer: 7 mL/min — ABNORMAL LOW (ref >60)
GFR calc Af Amer: 7 mL/min — ABNORMAL LOW (ref >60)
GFR calc non Af Amer: 6 mL/min — ABNORMAL LOW (ref >60)
GFR calc non Af Amer: 6 mL/min — ABNORMAL LOW (ref >60)
GLUCOSE: 130 mg/dL — AB (ref 70–99)
Glucose, Bld: 201 mg/dL — ABNORMAL HIGH (ref 70–99)
POTASSIUM: 3.5 mmol/L (ref 3.5–5.1)
Potassium: 4.5 mmol/L (ref 3.5–5.1)
Sodium: 157 mmol/L — ABNORMAL HIGH (ref 135–145)
Sodium: 160 mmol/L — ABNORMAL HIGH (ref 135–145)

## 2018-07-25 LAB — TROPONIN I
TROPONIN I: 0.03 ng/mL — AB (ref <0.03)
Troponin I: 0.03 ng/mL (ref <0.03)

## 2018-07-25 LAB — LACTIC ACID, PLASMA
LACTIC ACID, VENOUS: 1.5 mmol/L (ref 0.5–1.9)
Lactic Acid, Venous: 2.1 mmol/L (ref 0.5–1.9)
Lactic Acid, Venous: 2.4 mmol/L (ref 0.5–1.9)

## 2018-07-25 LAB — POCT I-STAT 3, ART BLOOD GAS (G3+)
Acid-base deficit: 8 mmol/L — ABNORMAL HIGH (ref 0.0–2.0)
BICARBONATE: 17.4 mmol/L — AB (ref 20.0–28.0)
O2 Saturation: 96 %
PO2 ART: 85 mmHg (ref 83.0–108.0)
Patient temperature: 98.5
TCO2: 18 mmol/L — ABNORMAL LOW (ref 22–32)
pCO2 arterial: 32.8 mmHg (ref 32.0–48.0)
pH, Arterial: 7.333 — ABNORMAL LOW (ref 7.350–7.450)

## 2018-07-25 LAB — URINE CULTURE
CULTURE: NO GROWTH
Culture: NO GROWTH

## 2018-07-25 LAB — BETA-HYDROXYBUTYRIC ACID: Beta-Hydroxybutyric Acid: 0.31 mmol/L — ABNORMAL HIGH (ref 0.05–0.27)

## 2018-07-25 LAB — HIV ANTIBODY (ROUTINE TESTING W REFLEX): HIV SCREEN 4TH GENERATION: NONREACTIVE

## 2018-07-25 LAB — MAGNESIUM: Magnesium: 2.7 mg/dL — ABNORMAL HIGH (ref 1.7–2.4)

## 2018-07-25 LAB — PHOSPHORUS: PHOSPHORUS: 1.7 mg/dL — AB (ref 2.5–4.6)

## 2018-07-25 MED ORDER — DOCUSATE SODIUM 50 MG/5ML PO LIQD
100.0000 mg | Freq: Two times a day (BID) | ORAL | Status: DC | PRN
  Administered 2018-07-28 – 2018-07-29 (×2): 100 mg
  Filled 2018-07-25 (×3): qty 10

## 2018-07-25 MED ORDER — INSULIN GLARGINE 100 UNIT/ML ~~LOC~~ SOLN
10.0000 [IU] | Freq: Every day | SUBCUTANEOUS | Status: DC
  Administered 2018-07-25: 10 [IU] via SUBCUTANEOUS
  Filled 2018-07-25 (×2): qty 0.1

## 2018-07-25 MED ORDER — FENTANYL CITRATE (PF) 100 MCG/2ML IJ SOLN
50.0000 ug | Freq: Once | INTRAMUSCULAR | Status: AC
  Administered 2018-07-25: 50 ug via INTRAVENOUS

## 2018-07-25 MED ORDER — INSULIN ASPART 100 UNIT/ML ~~LOC~~ SOLN
0.0000 [IU] | SUBCUTANEOUS | Status: DC
  Administered 2018-07-25: 8 [IU] via SUBCUTANEOUS
  Administered 2018-07-25: 3 [IU] via SUBCUTANEOUS

## 2018-07-25 MED ORDER — FENTANYL BOLUS VIA INFUSION
50.0000 ug | INTRAVENOUS | Status: DC | PRN
  Administered 2018-07-25 – 2018-07-26 (×6): 50 ug via INTRAVENOUS
  Filled 2018-07-25: qty 50

## 2018-07-25 MED ORDER — FENTANYL 2500MCG IN NS 250ML (10MCG/ML) PREMIX INFUSION
25.0000 ug/h | INTRAVENOUS | Status: DC
  Administered 2018-07-25: 50 ug/h via INTRAVENOUS
  Administered 2018-07-26 – 2018-07-27 (×2): 75 ug/h via INTRAVENOUS
  Administered 2018-07-29 (×2): 150 ug/h via INTRAVENOUS
  Filled 2018-07-25 (×6): qty 250

## 2018-07-25 MED ORDER — DEXTROSE 5 % IV SOLN
40.0000 meq | Freq: Once | INTRAVENOUS | Status: AC
  Administered 2018-07-25: 40 meq via INTRAVENOUS
  Filled 2018-07-25: qty 9.09

## 2018-07-25 NOTE — Consult Note (Signed)
[facility] Nurse wound consult note Reason for Consult:Intertriginous dermatitis to inframammary region, moist significant to left breast.  Fungal component present with satellite lesions present to wound perimeter.  Wound type:Moisture associated skin damage to skin folds Pressure Injury POA: /NA Measurement: Left breast:  10 cm x 6.9 cm x 0.1 cm  Right breast intact, erythema measures 4 cm x 3 cm  Wound ZTI:WPYK and moist  Both breasts are moist Abdominal pannus is dry and intact Drainage (amount, consistency, odor) moderate serous weeping  Musty odor beneath breasts Periwound: intact Dressing procedure/placement/frequency:interdry Ag beneath breasts: Measure and cut length of InterDry Ag+ to fit in skin folds that have skin breakdown Tuck InterDry  Ag+ fabric into skin folds in a single layer, allow for 2 inches of overhang from skin edges to allow for wicking to occur May remove to bathe; dry area thoroughly and then tuck into affected areas again Do not apply any creams or ointments when using InterDry Ag+ DO NOT THROW AWAY FOR 5 DAYS unless soiled with stool DO NOT [place] product, this will inactivate the silver in the material  New sheet of Interdry Ag+ should be applied after 5 days of use if patient continues to have skin breakdown   Will not follow at this time.  Please re-consult if needed.  Domenic Moras RN BSN [place] Pager [phone]

## 2018-07-25 NOTE — Progress Notes (Signed)
PULMONARY / CRITICAL CARE MEDICINE   Name: [name] MRN: [id number] DOB: 12/23/1971    ADMISSION DATE:  07/24/2018 CONSULTATION DATE:  07/24/2018  REFERRING MD:  EDP  CHIEF COMPLAINT:  Altered mental status, Elevated BS  HISTORY OF PRESENT ILLNESS:   47 year old with history of hypertension, lupus, goiter, paranoid schizophrenia, diabetes went to [facility] ED with altered mental status.  EMS called by family as she was sick for 3 days and lying on the couch.  Patient had seizure activity with EMS and given 5 mg of Versed. Found to have hypoglycemia with blood sugars of 1556, anion gap acidosis, elevated lactic acid. Intubated for altered mental status in the ED and transferred to [facility].  As per the family she did not have any fevers, nausea, vomiting, diarrhea, pain prior to this.  They are not aware that she has diabetes but she does have metformin on her med list.  There are no prior history of seizures.  She had not been taking any of her other medications except psychiatric medications for the past 2 to 3 months.  Family has noticed that she was having excessive urination over the past few weeks.   SUBJECTIVE: RN reports pt transitioned off propofol for agitation due to hypotension to versed gtt.  Reports pt moves everything but does not follow commands.    VITAL SIGNS: BP 131/73   Pulse 96   Temp 98.2 F (36.8 C) (Oral)   Resp (!) 30   Ht 5\' 9"  (1.753 m)   Wt 283 lb 1.1 oz (128.4 kg)   LMP 09/18/2012   SpO2 (!) 86%   BMI 41.80 kg/m   HEMODYNAMICS:    VENTILATOR SETTINGS: Vent Mode: CPAP;PSV FiO2 (%):  [40 %-60 %] 40 % Set Rate:  [20 bmp] 20 bmp Vt Set:  [530 mL] 530 mL PEEP:  [0 cmH20-5 cmH20] 5 cmH20 Pressure Support:  [10 cmH20] 10 cmH20 Plateau Pressure:  [17 cmH20-18 cmH20] 18 cmH20  INTAKE / OUTPUT: I/O last 3 completed shifts: In: 8085.1 [I.V.:2959.3; IV Piggyback:5125.8] Out: 450 [Urine:400; Emesis/NG output:50]  PHYSICAL  EXAMINATION: General: obese female in NAD, on vent  HEENT: MM pink/moist, ETT, exophthalmus, scleral edema   Neuro: sedate  CV: s1s2 rrr, no m/r/g PULM: even/non-labored, lungs bilaterally clear  GI: soft, non-tender, bsx4 active  Extremities: warm/dry, no significant edema  Skin: no rashes or lesions  LABS:  BMET Recent Labs  Lab 07/24/18 2010 07/24/18 2345 07/25/18 0251  NA 151* 157* 160*  K 5.5* 4.5 3.5  CL 115* 124* 128*  CO2 18* 20* 22  BUN 85* 83* 81*  CREATININE 7.11* 6.85* 6.91*  GLUCOSE 729* 201* 130*    Electrolytes Recent Labs  Lab 07/24/18 2010 07/24/18 2345 07/25/18 0251  CALCIUM 8.8* 8.1* 8.2*  MG 3.5*  --  2.7*  PHOS 3.2  --  1.7*    CBC Recent Labs  Lab 07/24/18 1021 07/24/18 1028 07/24/18 1704 07/25/18 0251  WBC 11.5*  --  7.1 8.6  HGB 14.2 16.3* 13.2 13.1  HCT 54.8* 48.0* 47.2* 43.4  PLT 250  --  177 178    Coag's Recent Labs  Lab 07/24/18 1021  INR 1.14    Sepsis Markers Recent Labs  Lab 07/24/18 2010 07/24/18 2345 07/25/18 0251 07/25/18 0852  LATICACIDVEN 3.6* 2.4* 1.5 2.1*  PROCALCITON 1.04  --   --   --     ABG Recent Labs  Lab 07/24/18 1200 07/25/18 0412  PHART  7.232* 7.333*  PCO2ART 34.4 32.8  PO2ART 228* 85.0    Liver Enzymes Recent Labs  Lab 07/24/18 1021 07/24/18 2010  AST 29 33  ALT 45* 41  ALKPHOS 118 114  BILITOT 1.5* 1.5*  ALBUMIN 3.5 3.0*    Cardiac Enzymes Recent Labs  Lab 07/24/18 2010 07/24/18 2345 07/25/18 0251  TROPONINI <0.03 0.03* 0.03*    Glucose Recent Labs  Lab 07/25/18 0516 07/25/18 0606 07/25/18 0658 07/25/18 0757 07/25/18 0900 07/25/18 1010  GLUCAP 229* 254* 272* 286* 294* 240*    Imaging Ct Head Wo Contrast  Result Date: 07/24/2018 CLINICAL DATA:  Altered mental status, unclear cause. Seizure activity. Patient responsive only to painful stimuli. Hyperglycemia. EXAM: CT HEAD WITHOUT CONTRAST TECHNIQUE: Contiguous axial images were obtained from the base of  the skull through the vertex without intravenous contrast. COMPARISON:  None. FINDINGS: Brain: No acute infarct, hemorrhage, or mass lesion is present. The ventricles are of normal size. No significant white matter disease is present. The brainstem and cerebellum are within normal limits. No significant extraaxial fluid collection is present. Vascular: No hyperdense vessel or unexpected calcification. Skull: Calvarium is intact. No acute or healing fractures are present. No focal lytic or blastic lesions are present. Sinuses/Orbits: The paranasal sinuses and mastoid air cells are clear. Bilateral exophthalmos is noted. No retro-orbital mass is present. Globes and orbits are otherwise within normal limits. IMPRESSION: 1. Normal CT appearance of brain. 2. Bilateral exophthalmos.  Question thyroid disease. Electronically Signed   By: San Morelle M.D.   On: 07/24/2018 12:58   US Renal  Result Date: 07/25/2018 CLINICAL DATA:  Acute renal injury EXAM: RENAL / URINARY TRACT ULTRASOUND COMPLETE COMPARISON:  01/12/2015 CT of the abdomen and pelvis FINDINGS: Right Kidney: Length: 13.9 cm. Echogenicity within normal limits. No mass or hydronephrosis visualized. Left Kidney: Length: 12.9 cm. Echogenicity within normal limits. No mass or hydronephrosis visualized. Bladder: Appears normal for degree of bladder distention. IMPRESSION: No acute abnormality identified. Electronically Signed   By: Inez Catalina M.D.   On: 07/25/2018 09:09   Dg Chest Port 1 View  Result Date: 07/25/2018 CLINICAL DATA:  Acute respiratory failure EXAM: PORTABLE CHEST 1 VIEW COMPARISON:  07/24/2018 FINDINGS: Cardiac shadow is stable. Endotracheal tube is been withdrawn in the interval now lying in the distal trachea. The lungs are well aerated with the exception of minimal left basilar atelectasis. Nasogastric catheter is noted within the stomach. Postoperative changes in the neck are seen. No bony abnormality is noted. IMPRESSION: Tubes  and lines in satisfactory position. Mild left basilar atelectasis is noted. Electronically Signed   By: Inez Catalina M.D.   On: 07/25/2018 06:56   Dg Chest Port 1 View  Addendum Date: 07/24/2018   ADDENDUM REPORT: 07/24/2018 11:19 ADDENDUM: These results were called by telephone at the time of interpretation on 07/24/2018 at 11:13 am to Dr. Francine Graven , who verbally acknowledged these results. Electronically Signed   By: Fidela Salisbury M.D.   On: 07/24/2018 11:19   Result Date: 07/24/2018 CLINICAL DATA:  Post intubation. EXAM: PORTABLE CHEST 1 VIEW COMPARISON:  Body CT 09/30/2012 FINDINGS: The endotracheal tube terminates in the proximal right mainstem bronchus. Retraction by approximately 3 cm is recommended. Enteric catheter tip overlies the expected location of gastric cardia. Cardiomediastinal silhouette is normal. Mediastinal contours appear intact. There is no evidence of pneumothorax. Low lung volumes with increased interstitial markings. Elevation of the left hemidiaphragm suggestive of atelectatic changes of the left lung. Osseous structures are  without acute abnormality. Soft tissues are grossly normal. IMPRESSION: Right mainstem bronchus intubation. Suggested retraction of the endotracheal tube by approximately 3 cm. Low lung volumes bilaterally with more prominent atelectatic changes of the left lung. Electronically Signed: By: Fidela Salisbury M.D. On: 07/24/2018 11:07    STUDIES:  Renal US 7/28 >>   CULTURES: BCx2 7/27 >>  UC 7/27 >>   ANTIBIOTICS:   SIGNIFICANT EVENTS: 7/27  Admit   LINES/TUBES: ETT 7/27 >>  DISCUSSION: 47 year old with history of schizophrenia, goiter diabetes admitted with DKA due to non compliance with meds  ASSESSMENT / PLAN:  PULMONARY A: Intubated due to altered mental status P:   PRVC 8 cc/kg  Wean PEEP / FiO2 for sats >905  CARDIOVASCULAR A:  Hypotension, lactic acidosis P:  ICU monitoring  Fluids per DKA protocol  Trend  lactate   RENAL A:   AKI, creatinine 7.72 Hyperkalemia P:   Trend BMP / urinary output Replace electrolytes as indicated Avoid nephrotoxic agents, ensure adequate renal perfusion Assess renal US  GASTROINTESTINAL A:   Stable P:   PPI for SUP  Consider TF 7/28 if remains intubated / sugars stable  HEMATOLOGIC A:   Elevated hematocrit secondary to dehydration P:  Trend CBC   INFECTIOUS A:   No evidence of infection P:   Follow cultures  Monitor off abx  ENDOCRINE A:   DKA Goiter, exophthalmos Hypothyroidism - TSH 6.379 on admit P:   DKA protocol  Continue synthroid  Hold home metformin   NEUROLOGIC A:   Acute Metabolic Encephalopathy - AKI, DKA, ? Post ictal Seizure- reported on admit, no hx of  CT head is unremarkable. P:   Supportive care  Await EEG Follow neuro exam  PRN versed for seizure Monitor off AED's for now   FAMILY  - Updates: No family at bedside am 7/28  - Inter-disciplinary family meet or Palliative Care meeting due by:  8/5    Noe Gens, NP-C [facility] Pgr: [phone] or if no answer [phone] 07/25/2018, 10:53 AM

## 2018-07-25 NOTE — Progress Notes (Signed)
Initial Nutrition Assessment  DOCUMENTATION CODES:   Obesity unspecified  INTERVENTION:  Replete P04-  If patient remains intubated for another 24 hours and blood sugars remain stable, recommend:  Vital High Protein at 56mL/hr Pro-stat 67mL TID  Provides 1740 calories, 171 grams of protein, and 1236mL free water  NUTRITION DIAGNOSIS:   Inadequate oral intake related to inability to eat as evidenced by NPO status.  GOAL:   Provide needs based on ASPEN/SCCM guidelines  MONITOR:   I & O's, Weight trends  REASON FOR ASSESSMENT:   Ventilator    ASSESSMENT:   Patient with PMH HTN, lupus, goiter, paranoid schizophrenia, diabetes presented with AMS. Family states she was sick for 3 days and just lying on the couch. Blood sugar upon presentation was 1556, admitted for DKA, AKI.   No family at bedside. Discussed with RN. Patient will be transitioned off of insulin gtt.  Patient is currently intubated on ventilator support MV: 10.4 L/min Temp (24hrs), Avg:98.3 F (36.8 C), Min:98.2 F (36.8 C), Max:98.6 F (37 C)  Propofol: None  OGT to LIWS  Medications reviewed and include:  D5 1/2 NS at 45mL/hr --> 306 calories Fentanyl gtt Insulin gtt  Labs reviewed:  CBGs 192, 249, 240, 294 BUN/Cr 81/6.91 PO4- 1.7, Mg 2.7   Intake/Output Summary (Last 24 hours) at 07/25/2018 1638 Last data filed at 07/25/2018 1600 Gross per 24 hour  Intake 6723.64 ml  Output 475 ml  Net 6248.64 ml  8.6L Fluid Positive  NUTRITION - FOCUSED PHYSICAL EXAM:    Most Recent Value  Orbital Region  No depletion  Upper Arm Region  No depletion  Thoracic and Lumbar Region  No depletion  Buccal Region  No depletion  Temple Region  No depletion  Clavicle Bone Region  No depletion  Clavicle and Acromion Bone Region  No depletion  Scapular Bone Region  No depletion  Dorsal Hand  No depletion  Patellar Region  No depletion  Anterior Thigh Region  No depletion  Posterior Calf Region  No  depletion  Edema (RD Assessment)  Unable to assess  Hair  Reviewed  Eyes  Unable to assess  Mouth  Unable to assess  Skin  Reviewed  Nails  Reviewed       Diet Order:   Diet Order           Diet NPO time specified  Diet effective now          EDUCATION NEEDS:   Not appropriate for education at this time  Skin:  Skin Assessment: Reviewed RN Assessment(MSAD to L breast)  Last BM:  PTA  Height:   Ht Readings from Last 1 Encounters:  07/24/18 5\' 9"  (1.753 m)    Weight:   Wt Readings from Last 1 Encounters:  07/25/18 283 lb 1.1 oz (128.4 kg)    Ideal Body Weight:  65.9 kg  BMI:  Body mass index is 41.8 kg/m.  Estimated Nutritional Needs:   Kcal:  1371-1746 calories (11-14 cal/kg ABW)  Protein:  >164 grams (2.5g/kg IBW)  Fluid:  >1.5L    Debra Anis. Debra Ojala, MS, RD LDN Inpatient Clinical Dietitian Pager [phone]

## 2018-07-25 NOTE — Progress Notes (Signed)
[place] notified of LA=2.4 and troponin of 0.03. El[facility] also notified of pt only voiding 26mls since 1900.   Will continue to monitor.

## 2018-07-25 NOTE — Progress Notes (Signed)
Versed 68ml's WIS, witnessed by [employer], Therapist, sports.

## 2018-07-26 ENCOUNTER — Inpatient Hospital Stay (HOSPITAL_COMMUNITY): Payer: BLUE CROSS/BLUE SHIELD

## 2018-07-26 DIAGNOSIS — R4182 Altered mental status, unspecified: Secondary | ICD-10-CM

## 2018-07-26 DIAGNOSIS — N179 Acute kidney failure, unspecified: Secondary | ICD-10-CM

## 2018-07-26 DIAGNOSIS — E1111 Type 2 diabetes mellitus with ketoacidosis with coma: Secondary | ICD-10-CM

## 2018-07-26 DIAGNOSIS — J9600 Acute respiratory failure, unspecified whether with hypoxia or hypercapnia: Secondary | ICD-10-CM

## 2018-07-26 LAB — COMPREHENSIVE METABOLIC PANEL
ALBUMIN: 2.2 g/dL — AB (ref 3.5–5.0)
ALT: 32 U/L (ref 0–44)
ANION GAP: 15 (ref 5–15)
AST: 27 U/L (ref 15–41)
Alkaline Phosphatase: 96 U/L (ref 38–126)
BUN: 86 mg/dL — AB (ref 6–20)
CHLORIDE: 122 mmol/L — AB (ref 98–111)
CO2: 17 mmol/L — ABNORMAL LOW (ref 22–32)
Calcium: 7.2 mg/dL — ABNORMAL LOW (ref 8.9–10.3)
Creatinine, Ser: 8.63 mg/dL — ABNORMAL HIGH (ref 0.44–1.00)
GFR calc Af Amer: 6 mL/min — ABNORMAL LOW (ref >60)
GFR, EST NON AFRICAN AMERICAN: 5 mL/min — AB (ref >60)
GLUCOSE: 527 mg/dL — AB (ref 70–99)
POTASSIUM: 5 mmol/L (ref 3.5–5.1)
SODIUM: 154 mmol/L — AB (ref 135–145)
Total Bilirubin: 0.7 mg/dL (ref 0.3–1.2)
Total Protein: 6 g/dL — ABNORMAL LOW (ref 6.5–8.1)

## 2018-07-26 LAB — CBC
HEMATOCRIT: 41.3 % (ref 36.0–46.0)
HEMOGLOBIN: 12.5 g/dL (ref 12.0–15.0)
MCH: 26.7 pg (ref 26.0–34.0)
MCHC: 30.3 g/dL (ref 30.0–36.0)
MCV: 88.2 fL (ref 78.0–100.0)
Platelets: 90 10*3/uL — ABNORMAL LOW (ref 150–400)
RBC: 4.68 MIL/uL (ref 3.87–5.11)
RDW: 17.6 % — ABNORMAL HIGH (ref 11.5–15.5)
WBC: 8.2 10*3/uL (ref 4.0–10.5)

## 2018-07-26 LAB — GLUCOSE, CAPILLARY
GLUCOSE-CAPILLARY: 320 mg/dL — AB (ref 70–99)
GLUCOSE-CAPILLARY: 309 mg/dL — AB (ref 70–99)
GLUCOSE-CAPILLARY: 234 mg/dL — AB (ref 70–99)
GLUCOSE-CAPILLARY: 182 mg/dL — AB (ref 70–99)
GLUCOSE-CAPILLARY: 162 mg/dL — AB (ref 70–99)
GLUCOSE-CAPILLARY: 131 mg/dL — AB (ref 70–99)
GLUCOSE-CAPILLARY: 124 mg/dL — AB (ref 70–99)
GLUCOSE-CAPILLARY: 114 mg/dL — AB (ref 70–99)
Glucose-Capillary: >600 mg/dL (ref 70–99)
Glucose-Capillary: 419 mg/dL — ABNORMAL HIGH (ref 70–99)
Glucose-Capillary: 434 mg/dL — ABNORMAL HIGH (ref 70–99)
Glucose-Capillary: 459 mg/dL — ABNORMAL HIGH (ref 70–99)
Glucose-Capillary: 425 mg/dL — ABNORMAL HIGH (ref 70–99)
Glucose-Capillary: 387 mg/dL — ABNORMAL HIGH (ref 70–99)
Glucose-Capillary: 203 mg/dL — ABNORMAL HIGH (ref 70–99)
Glucose-Capillary: 178 mg/dL — ABNORMAL HIGH (ref 70–99)
Glucose-Capillary: 89 mg/dL (ref 70–99)
Glucose-Capillary: 112 mg/dL — ABNORMAL HIGH (ref 70–99)
Glucose-Capillary: 125 mg/dL — ABNORMAL HIGH (ref 70–99)

## 2018-07-26 MED ORDER — FREE WATER
250.0000 mL | Status: DC
  Administered 2018-07-26 – 2018-07-27 (×6): 250 mL

## 2018-07-26 MED ORDER — PANTOPRAZOLE SODIUM 40 MG PO PACK
40.0000 mg | PACK | Freq: Every day | ORAL | Status: DC
  Administered 2018-07-26 – 2018-08-06 (×12): 40 mg
  Filled 2018-07-26 (×10): qty 20

## 2018-07-26 MED ORDER — INSULIN ASPART 100 UNIT/ML ~~LOC~~ SOLN
18.0000 [IU] | Freq: Once | SUBCUTANEOUS | Status: AC
  Administered 2018-07-26: 18 [IU] via SUBCUTANEOUS

## 2018-07-26 NOTE — Progress Notes (Signed)
Inpatient Diabetes Program Recommendations  AACE/ADA: New Consensus Statement on Inpatient Glycemic Control (2015)  Target Ranges:  Prepandial:   less than 140 mg/dL      Peak postprandial:   less than 180 mg/dL (1-2 hours)      Critically ill patients:  140 - 180 mg/dL   Lab Results  Component Value Date   GLUCAP 203 (H) 07/26/2018   HGBA1C 6.1 (H) 06/03/2015    Review of Glycemic ControlResults for ARCHER, VISE (MRN [id number]) as of 07/26/2018 14:35  Ref. Range 07/26/2018 09:07 07/26/2018 10:12 07/26/2018 11:17 07/26/2018 13:14 07/26/2018 14:13  Glucose-Capillary Latest Ref Range: 70 - 99 mg/dL 387 (H) 320 (H) 309 (H) 234 (H) 203 (H)    Diabetes history: No previous history noted Current orders for Inpatient glycemic control:  IV insulin  Inpatient Diabetes Program Recommendations:    Note that patient admitted with glucose>1500.  This appears to be new diagnosis for patient.  Attempted wean off insulin drip on 7/28 however blood sugars back up to 400's.  Now back on insulin drip and drip rates are high.   -Please check A1C.   -Consider delaying transition off insulin drip until CBG's consistently < 180 mg/dL, drip rates less than 4 unit/hr, and DKA resolved.    Will see patient when appropriate.   Thanks,  Adah Perl, RN, BC-ADM Inpatient Diabetes Coordinator Pager [phone] (8a-5p)

## 2018-07-26 NOTE — Progress Notes (Signed)
[place] notified of pt's blood sugar 419. 18 units of humalog given. Will recheck sugar in 2 hrs.

## 2018-07-26 NOTE — Progress Notes (Signed)
MEDICATION RELATED NOTE   Pharmacy Re:  Home Meds  Estimated Creatinine Clearance: 11.6 mL/min (A) (by C-G formula based on SCr of 8.63 mg/dL (H)). Assessment: We attempted to complete this patients home meds by reviewing her prescription fill history as well as bottles brought in by family.  She has not had anything filled through [facility] since 2018.    We have left all of her medications on her profile but cannot confirm if she is actually taking any of them.  Plan:  - Please resume those medications that you feel are most appropriate for her care at this time. - We have marked her record complete.  Medications:  Medications Prior to Admission  Medication Sig Dispense Refill Last Dose  . ibuprofen (ADVIL,MOTRIN) 200 MG tablet Take 400 mg by mouth every 6 (six) hours as needed for headache (pain).   week ago  . amLODipine (NORVASC) 10 MG tablet Take 1 tablet (10 mg total) by mouth daily. 30 tablet 0 09/05/2015 at Unknown time  . benztropine (COGENTIN) 0.5 MG tablet Take 1 tablet (0.5 mg total) by mouth 2 (two) times daily. 60 tablet 0 09/05/2015 at Unknown time  . benztropine (COGENTIN) 0.5 MG tablet Take 1 tablet (0.5 mg total) by mouth 2 (two) times daily. 60 tablet 0   . clonazePAM (KLONOPIN) 0.5 MG tablet Take 1 tablet (0.5 mg total) by mouth 2 (two) times daily. 60 tablet 0 09/05/2015 at Unknown time  . divalproex (DEPAKOTE SPRINKLE) 125 MG capsule Take 8 capsules (1,000 mg total) by mouth at bedtime. 240 capsule 0 unknown  . ferrous sulfate 325 (65 FE) MG tablet Take 325 mg by mouth 2 (two) times daily with a meal.   unknown  . hydrALAZINE (APRESOLINE) 50 MG tablet Take 1 tablet (50 mg total) by mouth every 8 (eight) hours. 90 tablet 0 09/05/2015 at Unknown time  . hydrochlorothiazide (HYDRODIURIL) 25 MG tablet Take 1 tablet (25 mg total) by mouth daily. 30 tablet 0 09/05/2015 at Unknown time  . levothyroxine (SYNTHROID) 125 MCG tablet Take 1 tablet (125 mcg total) by mouth daily. 90  tablet 0 09/05/2015 at Unknown time  . lisinopril-hydrochlorothiazide (PRINZIDE,ZESTORETIC) 10-12.5 MG tablet Take 1 tablet by mouth daily.   unknown  . LORazepam (ATIVAN) 0.5 MG tablet Take 1 tablet (0.5 mg total) by mouth every 8 (eight) hours as needed for anxiety. 60 tablet 0   . metFORMIN (GLUCOPHAGE-XR) 500 MG 24 hr tablet Take 1 tablet (500 mg total) by mouth daily with breakfast.  5 unknown  . metoprolol (LOPRESSOR) 50 MG tablet Take 1 tablet (50 mg total) by mouth 2 (two) times daily. 60 tablet 0 09/05/2015 at Unknown time  . paliperidone (INVEGA SUSTENNA) 234 MG/1.5ML SUSP injection Inject 234 mg into the muscle every 28 (twenty-eight) days. 0.9 mL 1   . potassium chloride SA (K-DUR,KLOR-CON) 20 MEQ tablet Take 20 mEq by mouth 2 (two) times daily.   unknown  . risperiDONE (RISPERDAL M-TABS) 3 MG disintegrating tablet Take 1 tablet (3 mg total) by mouth at bedtime. 30 tablet 0   . traZODone (DESYREL) 100 MG tablet Take 1 tablet (100 mg total) by mouth at bedtime. 30 tablet 0 09/05/2015 at Unknown time  . traZODone (DESYREL) 100 MG tablet Take 1 tablet (100 mg total) by mouth at bedtime. 30 tablet 0   . Vitamin D, Ergocalciferol, (DRISDOL) 50000 units CAPS capsule Take 50,000 Units by mouth every Monday.   unknown  . zolpidem (AMBIEN) 5 MG tablet Take  1 tablet (5 mg total) by mouth at bedtime as needed for sleep. 30 tablet 0    Rober Minion, PharmD., MS Clinical Pharmacist Pager:  [phone] Thank you for allowing pharmacy to be part of this patients care team. 07/26/2018,11:34 AM

## 2018-07-26 NOTE — Progress Notes (Addendum)
PULMONARY / CRITICAL CARE MEDICINE   Name: [name] MRN: [id number] DOB: 1971-04-06    ADMISSION DATE:  07/24/2018 CONSULTATION DATE:  07/24/2018  REFERRING MD:  EDP  CHIEF COMPLAINT:  Altered mental status, Elevated BS  HISTORY OF PRESENT ILLNESS:   47 year old with history of hypertension, lupus, goiter, paranoid schizophrenia, diabetes went to [facility] ED with altered mental status.  EMS called by family as she was sick for 3 days and lying on the couch.  Patient had seizure activity with EMS and given 5 mg of Versed. Found to have hypoglycemia with blood sugars of 1556, anion gap acidosis, elevated lactic acid. Intubated for altered mental status in the ED and transferred to [facility].  As per the family she did not have any fevers, nausea, vomiting, diarrhea, pain prior to this.  They are not aware that she has diabetes but she does have metformin on her med list.  There are no prior history of seizures.  She had not been taking any of her other medications except psychiatric medications for the past 2 to 3 months.  Family has noticed that she was having excessive urination over the past few weeks.   SUBJECTIVE:  Pt restarted on insulin overnight.  Remains on fentanyl gtt.   VITAL SIGNS: BP 114/74   Pulse (!) 105   Temp 98.5 F (36.9 C) (Oral)   Resp (!) 22   Ht 5\' 9"  (1.753 m)   Wt 293 lb 3.4 oz (133 kg)   LMP 09/18/2012   SpO2 98%   BMI 43.30 kg/m   HEMODYNAMICS:    VENTILATOR SETTINGS: Vent Mode: PSV;CPAP FiO2 (%):  [40 %-50 %] 50 % Set Rate:  [20 bmp] 20 bmp Vt Set:  [530 mL] 530 mL PEEP:  [5 cmH20] 5 cmH20 Pressure Support:  [10 cmH20] 10 cmH20 Plateau Pressure:  [18 cmH20] 18 cmH20  INTAKE / OUTPUT: I/O last 3 completed shifts: In: 9803.4 [I.V.:8252.6; IV Piggyback:1550.9] Out: 815 [Urine:565; Emesis/NG output:250]  PHYSICAL EXAMINATION: General: Obese female lying in bed on vent HEENT: MM pink/moist, ETT, exophthalmos Neuro:  Eye-opening to name, does not follow commands CV: s1s2 rrr, no m/r/g PULM: even/non-labored, lungs bilaterally clear anterior, diminished bases TD:DUKG, non-tender, bsx4 active  Extremities: warm/dry, no significant edema  Skin: no rashes or lesions  LABS:  BMET Recent Labs  Lab 07/24/18 2345 07/25/18 0251 07/26/18 0402  NA 157* 160* 154*  K 4.5 3.5 5.0  CL 124* 128* 122*  CO2 20* 22 17*  BUN 83* 81* 86*  CREATININE 6.85* 6.91* 8.63*  GLUCOSE 201* 130* 527*    Electrolytes Recent Labs  Lab 07/24/18 2010 07/24/18 2345 07/25/18 0251 07/26/18 0402  CALCIUM 8.8* 8.1* 8.2* 7.2*  MG 3.5*  --  2.7*  --   PHOS 3.2  --  1.7*  --     CBC Recent Labs  Lab 07/24/18 1704 07/25/18 0251 07/26/18 0722  WBC 7.1 8.6 8.2  HGB 13.2 13.1 12.5  HCT 47.2* 43.4 41.3  PLT 177 178 90*    Coag's Recent Labs  Lab 07/24/18 1021  INR 1.14    Sepsis Markers Recent Labs  Lab 07/24/18 2010 07/24/18 2345 07/25/18 0251 07/25/18 0852  LATICACIDVEN 3.6* 2.4* 1.5 2.1*  PROCALCITON 1.04  --   --   --     ABG Recent Labs  Lab 07/24/18 1200 07/25/18 0412  PHART 7.232* 7.333*  PCO2ART 34.4 32.8  PO2ART 228* 85.0    Liver Enzymes  Recent Labs  Lab 07/24/18 1021 07/24/18 2010 07/26/18 0402  AST 29 33 27  ALT 45* 41 32  ALKPHOS 118 114 96  BILITOT 1.5* 1.5* 0.7  ALBUMIN 3.5 3.0* 2.2*    Cardiac Enzymes Recent Labs  Lab 07/24/18 2010 07/24/18 2345 07/25/18 0251  TROPONINI <0.03 0.03* 0.03*    Glucose Recent Labs  Lab 07/26/18 0337 07/26/18 0629 07/26/18 0710 07/26/18 0809 07/26/18 0907 07/26/18 1012  GLUCAP 419* 434* 459* 425* 387* 320*    Imaging Dg Chest Port 1 View  Result Date: 07/26/2018 CLINICAL DATA:  Acute respiratory failure with hypoxia. Intubated patient. EXAM: PORTABLE CHEST 1 VIEW COMPARISON:  Portable chest x-ray of July 25, 2018 FINDINGS: The lungs are mildly hypoinflated. There is persistent increased density in the retrocardiac region  and obscuring the left hemidiaphragm. The right lung is clear. The heart is top-normal in size. The pulmonary vascularity is mildly prominent. The endotracheal tube tip projects at the level of the clavicular heads approximately 4 cm above the carina. The esophagogastric tube appears to be coiled in a hiatal hernia with the tip in proximal port below the left hemidiaphragm. IMPRESSION: Left lower lobe atelectasis or pneumonia. Small left pleural effusion. Mild central pulmonary vascular prominence may reflect low-grade CHF. No significant interstitial edema. Coiling of the esophagogastric tube in the left retrocardiac region likely in a hiatal hernia. Electronically Signed   By: David  Martinique M.D.   On: 07/26/2018 07:03    STUDIES:  Renal US 7/28 >> no acute abnormality  CULTURES: BCx2 7/27 >>  UC 7/27 >> negative  ANTIBIOTICS:   SIGNIFICANT EVENTS: 7/27  Admit  7/28 Sedation adjusted to fentanyl drip. No follow commands 7/29 nephrology consulted  LINES/TUBES: ETT 7/27 >>  DISCUSSION: 47 year old with history of schizophrenia, goiter diabetes admitted with DKA due to non compliance with meds & AKI.  Progressive rise in serum creatinine with borderline urinary output.  ASSESSMENT / PLAN:  PULMONARY A: Acute Respiratory Insufficiency - in the setting of acute metabolic encephalopathy  P:   PRVC 8 cc/kg Wean PEEP/FiO2 for sats greater than 90% Follow intermittent chest x-ray Daily PSV trials as tolerated with goal for extubation once mental status permits  CARDIOVASCULAR A:  Hypotension, lactic acidosis -resolved P:  ICU monitoring  Fluids per DKA protocol  Trend lactate   RENAL A:   AKI - rising sr cr, borderline oliguric Hyperkalemia AGMA - in setting of renal failure, DKA P:   Trend BMP / urinary output Replace electrolytes as indicated Avoid nephrotoxic agents, ensure adequate renal perfusion Nephrology consulted 7/29, appreciate input.  Keep foley catheter for  strict I/O   GASTROINTESTINAL A:   Morbid Obesity  P:   PPI for SUP  Consider TF 7/29 if remains intubated / sugars will tolerate   HEMATOLOGIC A:   Elevated hematocrit secondary to dehydration P:  Trend CBC   INFECTIOUS A:   No evidence of infection P:   Monitor off abx  Follow cultures   ENDOCRINE A:   DKA Goiter, exophthalmos Hypothyroidism - TSH 6.379 on admit P:   DKA protocol Hold home metformin Continue Synthroid  NEUROLOGIC A:   Acute Metabolic Encephalopathy - AKI, DKA, ? Post ictal Seizure- reported on admit, no hx of  CT head is unremarkable. P:   Frequent neuro exams PRN Versed for seizures Monitor off antiepileptics for now Await EEG > no further clinical seizure activity noted  FAMILY  - Updates: Family available on a.m. 7/29 NP rounds.  -  Inter-disciplinary family meet or Palliative Care meeting due by:  8/5  CC Time: 5 minutes   Noe Gens, NP-C [place] [facility] Pgr: [phone] or if no answer [phone] 07/26/2018, 10:25 AM

## 2018-07-26 NOTE — Progress Notes (Signed)
Nutrition Follow-up  DOCUMENTATION CODES:   Obesity unspecified  INTERVENTION:   Once able to begin TF:   Vital High Protein @ 65 ml/hr (1550ml) via OGT   Provides: 1560 kcals, 137 grams protein, 1310 ml free water. Meets 100% of needs.   NUTRITION DIAGNOSIS:   Inadequate oral intake related to inability to eat as evidenced by NPO status.  Ongoing  GOAL:   Provide needs based on ASPEN/SCCM guidelines  Possibly start TF tomorrow   MONITOR:   I & O's, Weight trends  REASON FOR ASSESSMENT:   Ventilator    ASSESSMENT:   Patient with PMH HTN, lupus, goiter, paranoid schizophrenia, diabetes presented with AMS. Family states she was sick for 3 days and just lying on the couch. Blood sugar upon presentation was 1556, admitted for DKA, AKI.   Mental status remains a barrier to extubation. Pt transition off propofol yesterday.  Nephrology consulted for increased creatinine and borderline oliguria.   Spoke with CCM. Plan to possibly start TF tomorrow due to high CBG today. Insulin drip has been started.  Nutrition needs adjusted.   Patient is currently intubated on ventilator support MV: 12.5 L/min Temp (24hrs), Avg:98.5 F (36.9 C), Min:98.2 F (36.8 C), Max:98.9 F (37.2 C)  I/O: + 14.7 L since admit UOP: 435 ml x 24 hrs NGT: 200 ml dark brown color x 24 hrs  Medications reviewed and include: fentanyl Labs reviewed: Na 154 (h) CBG 527 (H) Creatinine 8.63 (H) corrected calcium 8.6 (L)  Diet Order:   Diet Order           Diet NPO time specified  Diet effective now          EDUCATION NEEDS:   Not appropriate for education at this time  Skin:  Skin Assessment: Reviewed RN Assessment(MSAD to L breast)  Last BM:  PTA  Height:   Ht Readings from Last 1 Encounters:  07/24/18 5\' 9"  (1.753 m)    Weight:   Wt Readings from Last 1 Encounters:  07/26/18 293 lb 3.4 oz (133 kg)    Ideal Body Weight:  65.9 kg  BMI:  Body mass index is 43.3  kg/m.  Estimated Nutritional Needs:   Kcal:  1371-1746 kcal  Protein:  >/= 132 grams  Fluid:  >/=1.3 L/day    Mariana Single RD, LDN Clinical Nutrition Pager # - [phone]

## 2018-07-26 NOTE — Progress Notes (Signed)
EEG DONE; RESULTS PENDING. NO SKIN BREAK DOWN AT TIME OF EEG PLACEMENT.

## 2018-07-26 NOTE — Progress Notes (Signed)
RT note: patient placed on CPAP/PSV of 10/5 at 0740.  Currently tolerating well.  Will continue to monitor.  

## 2018-07-26 NOTE — Procedures (Signed)
ELECTROENCEPHALOGRAM REPORT   Patient: [name]       Room #: [facility] EEG No. ID: [id number] Age: 47 y.o.        Sex: female Referring Physician: Byrum Report Date:  07/26/2018        Interpreting Physician: Alexis Goodell  History: [name] is an 47 y.o. female presenting with seizure who now has altered mental status  Medications:  Synthroid, Protonix, Fentanyl, Insulin  Conditions of Recording:  This is a 21 channel routine scalp EEG performed with bipolar and monopolar montages arranged in accordance to the international 10/20 system of electrode placement. One channel was dedicated to EKG recording.  The patient is in the intubated and sedated state.  Description:  Muscle and movement artifact are prominent during the recording often obscuring the background and making the record difficult to interpret.  The background activity, when visualized, is slow and poorly organized.  It consists of a low voltage mixture of delta and theta activity that is diffusely distributed.  At times intermittent periods of alpha can be appreciated as well, but it is difficult to distinguish this activity from artifact.  No distinct epileptiform activity is noted.   Hyperventilation and intermittent photic stimulation were not performed.   IMPRESSION: This is a technically difficult record due to the predominance of muscle and movement artifact.  Background activity when visualized is slow and poorly organized and although this may represent a diffuse disturbance, such as a metabolic encephalopathy, can not rule out medication effect as well.  No evidence of electrographic seizure activity noted.     Alexis Goodell, MD Neurology [phone] 07/26/2018, 1:20 PM

## 2018-07-26 NOTE — Consult Note (Addendum)
[facility] CONSULT NOTE    Date: 07/26/2018                  Patient Name:  [name]  MRN: [id number]  DOB: [birth year]  Age / Sex: 47 y.o., female         PCP: Celene Squibb, MD                 Service Requesting Consult: Dr. Vaughan Browner                  Reason for Consult: Acute renal failure             History of Present Illness: Patient is a 47 y.o. female with a PMHx of uncontrolled T2DM, hypothyroidism s/p RAI due to Grave's disease, HTN, schizophrenia, and medication noncompliance x 2-3 months  who was admitted to [facility] on 07/24/2018 for evaluation of altered mental status and seizure activity and found to be in DKA. [name] was initially seen at [facility] when found to be in DKA with an elevated Cr to 7.72 from 1.1 ten days prior to admission. Her mental status continued to worsened and she was intubated due to inability to protect her airway. She was transferred to [facility] for ICU level care. Her creatinine unfortunately has continued to trend up despite aggressive IVF resuscitation and she is now oliguric with UOP in 400s for the past 2 days. Potassium 5.0 as of this morning. She had a renal ultrasound yesterday that showed no acute abnormalities. UA 7/27 showed pyuria, proteinuria, and hematuria. She is currently mildly tachycardic but hemodynamically stable. Not on pressor support. On insulin gtt.    Medications: Outpatient medications: Medications Prior to Admission  Medication Sig Dispense Refill Last Dose  . ibuprofen (ADVIL,MOTRIN) 200 MG tablet Take 400 mg by mouth every 6 (six) hours as needed for headache (pain).   week ago  . amLODipine (NORVASC) 10 MG tablet Take 1 tablet (10 mg total) by mouth daily. 30 tablet 0 09/05/2015 at Unknown time  . benztropine (COGENTIN) 0.5 MG tablet Take 1 tablet (0.5 mg total) by mouth 2 (two) times daily. 60 tablet 0 09/05/2015 at Unknown time  . benztropine (COGENTIN) 0.5 MG tablet Take 1 tablet (0.5 mg total) by  mouth 2 (two) times daily. 60 tablet 0   . clonazePAM (KLONOPIN) 0.5 MG tablet Take 1 tablet (0.5 mg total) by mouth 2 (two) times daily. 60 tablet 0 09/05/2015 at Unknown time  . divalproex (DEPAKOTE SPRINKLE) 125 MG capsule Take 8 capsules (1,000 mg total) by mouth at bedtime. 240 capsule 0 unknown  . ferrous sulfate 325 (65 FE) MG tablet Take 325 mg by mouth 2 (two) times daily with a meal.   unknown  . hydrALAZINE (APRESOLINE) 50 MG tablet Take 1 tablet (50 mg total) by mouth every 8 (eight) hours. 90 tablet 0 09/05/2015 at Unknown time  . hydrochlorothiazide (HYDRODIURIL) 25 MG tablet Take 1 tablet (25 mg total) by mouth daily. 30 tablet 0 09/05/2015 at Unknown time  . levothyroxine (SYNTHROID) 125 MCG tablet Take 1 tablet (125 mcg total) by mouth daily. 90 tablet 0 09/05/2015 at Unknown time  . lisinopril-hydrochlorothiazide (PRINZIDE,ZESTORETIC) 10-12.5 MG tablet Take 1 tablet by mouth daily.   unknown  . LORazepam (ATIVAN) 0.5 MG tablet Take 1 tablet (0.5 mg total) by mouth every 8 (eight) hours as needed for anxiety. 60 tablet 0   . metFORMIN (GLUCOPHAGE-XR) 500 MG 24 hr tablet Take  1 tablet (500 mg total) by mouth daily with breakfast.  5 unknown  . metoprolol (LOPRESSOR) 50 MG tablet Take 1 tablet (50 mg total) by mouth 2 (two) times daily. 60 tablet 0 09/05/2015 at Unknown time  . paliperidone (INVEGA SUSTENNA) 234 MG/1.5ML SUSP injection Inject 234 mg into the muscle every 28 (twenty-eight) days. 0.9 mL 1   . potassium chloride SA (K-DUR,KLOR-CON) 20 MEQ tablet Take 20 mEq by mouth 2 (two) times daily.   unknown  . risperiDONE (RISPERDAL M-TABS) 3 MG disintegrating tablet Take 1 tablet (3 mg total) by mouth at bedtime. 30 tablet 0   . traZODone (DESYREL) 100 MG tablet Take 1 tablet (100 mg total) by mouth at bedtime. 30 tablet 0 09/05/2015 at Unknown time  . traZODone (DESYREL) 100 MG tablet Take 1 tablet (100 mg total) by mouth at bedtime. 30 tablet 0   . Vitamin D, Ergocalciferol, (DRISDOL) 50000  units CAPS capsule Take 50,000 Units by mouth every Monday.   unknown  . zolpidem (AMBIEN) 5 MG tablet Take 1 tablet (5 mg total) by mouth at bedtime as needed for sleep. 30 tablet 0     Current medications: Current Facility-Administered Medications  Medication Dose Route Frequency Provider Last Rate Last Dose  . 0.9 %  sodium chloride infusion  250 mL Intravenous PRN Mannam, Praveen, MD      . 0.9 %  sodium chloride infusion   Intravenous Continuous Mannam, Praveen, MD 150 mL/hr at 07/26/18 1000    . 0.9 %  sodium chloride infusion   Intra-arterial PRN Mannam, Praveen, MD 10 mL/hr at 07/26/18 1000    . chlorhexidine gluconate (MEDLINE KIT) (PERIDEX) 0.12 % solution 15 mL  15 mL Mouth Rinse BID Tarry Kos, MD   15 mL at 07/26/18 0810  . dextrose 50 % solution 25 mL  25 mL Intravenous PRN Francine Graven, DO      . docusate (COLACE) 50 MG/5ML liquid 100 mg  100 mg Per Tube BID PRN Ollis, Brandi L, NP      . fentaNYL (SUBLIMAZE) bolus via infusion 50 mcg  50 mcg Intravenous Q1H PRN Noe Gens L, NP   50 mcg at 07/26/18 0717  . fentaNYL 251mg in NS 2525m(1060mml) infusion-PREMIX  25-400 mcg/hr Intravenous Continuous Ollis, Brandi L, NP 5 mL/hr at 07/26/18 1000 50 mcg/hr at 07/26/18 1000  . heparin injection 5,000 Units  5,000 Units Subcutaneous Q8H Mannam, Praveen, MD   5,000 Units at 07/26/18 0549  . insulin regular (NOVOLIN R,HUMULIN R) 100 Units in sodium chloride 0.9 % 100 mL (1 Units/mL) infusion   Intravenous Continuous McMFrancine GravenO 13.1 mL/hr at 07/26/18 1000    . levothyroxine (SYNTHROID, LEVOTHROID) injection 62.5 mcg  62.5 mcg Intravenous Daily Mannam, Praveen, MD   62.5 mcg at 07/26/18 0911  . MEDLINE mouth rinse  15 mL Mouth Rinse 10 times per day RosTarry KosD   15 mL at 07/26/18 0911  . midazolam (VERSED) injection 2 mg  2 mg Intravenous Q2H PRN Mannam, Praveen, MD   2 mg at 07/26/18 0036      Allergies: No Known Allergies    Past Medical  History: Past Medical History:  Diagnosis Date  . Anemia   . 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 Delusional disorder ([facility] . DVT (deep venous thrombosis) ([facility] [place]/[facility]   in upper extremities b/l and LLE  . Fibroid tumor   . Graves disease   .  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 Arnoldo Morale on 12/13/2012.  Marland Kitchen Hypertension   . Lupus anticoagulant positive 10/02/2012  . Multinodular goiter 10/01/2012  . Paranoid schizophrenia ([place])   . Thyroid disease   . Thyroid nodule    s/p biopsy     Past Surgical History: Past Surgical History:  Procedure Laterality Date  . ABDOMINAL HYSTERECTOMY    . HEMATOMA EVACUATION  10/03/2012   Procedure: EVACUATION HEMATOMA;  Surgeon: Jonnie Kind, MD;  Location: [facility];  Service: Gynecology;  Laterality: N/A;  . SUPRACERVICAL ABDOMINAL HYSTERECTOMY  09/27/2012   Procedure: HYSTERECTOMY SUPRACERVICAL ABDOMINAL;  Surgeon: Jonnie Kind, MD;  Location: [facility];  Service: Gynecology;  Laterality: N/A;  . THYROIDECTOMY  12/13/2012   Procedure: THYROIDECTOMY;  Surgeon: Jamesetta So, MD;  Location: [facility];  Service: General;  Laterality: Bilateral;     Family History: Family History  Problem Relation Age of Onset  . Diabetes Mother   . Hypertension Mother   . Cancer Father        prostate   . Cancer Sister        pancreatic cancer     Social History: Social History   Socioeconomic History  . Marital status: Married    Spouse name: Not on file  . Number of children: Not on file  . Years of education: Not on file  . Highest education level: Not on file  Occupational History  . Not on file  Social Needs  . Financial resource strain: Not on file  . Food insecurity:    Worry: Not on file    Inability: Not on file  . Transportation needs:    Medical: Not on file    Non-medical: Not on file  Tobacco Use  . Smoking status: Never Smoker  . Smokeless tobacco: Never Used  Substance and  Sexual Activity  . Alcohol use: No  . Drug use: No  . Sexual activity: Yes    Birth control/protection: Surgical  Lifestyle  . Physical activity:    Days per week: Not on file    Minutes per session: Not on file  . Stress: Not on file  Relationships  . Social connections:    Talks on phone: Not on file    Gets together: Not on file    Attends religious service: Not on file    Active member of club or organization: Not on file    Attends meetings of clubs or organizations: Not on file    Relationship status: Not on file  . Intimate partner violence:    Fear of current or ex partner: Not on file    Emotionally abused: Not on file    Physically abused: Not on file    Forced sexual activity: Not on file  Other Topics Concern  . Not on file  Social History Narrative  . Not on file     Review of Systems: As per HPI  Vital Signs: Blood pressure 120/66, pulse (!) 109, temperature 98.5 F (36.9 C), temperature source Oral, resp. rate (!) 23, height _0  (1.753 m), weight 293 lb 3.4 oz (133 kg), last menstrual period 09/18/2012, SpO2 98 %.  Weight trends: Filed Weights   07/24/18 1507 07/25/18 0500 07/26/18 0500  Weight: 283 lb 1.1 oz (128.4 kg) 283 lb 1.1 oz (128.4 kg) 293 lb 3.4 oz (133 kg)    Physical Exam: General: Sedated and intubated. Does not follow commands, but withdraws  to pain.  Head: Normocephalic, atraumatic.  Eyes: PERRL, exophthalmos, No signs of anemia or jaundince.  Nose: Mucous membranes moist, not inflammed, nonerythematous.  Throat: ET and OG tubes in place.   Neck: No deformities, masses, or tenderness noted.Supple, No carotid Bruits, no JVD.  Lungs:  ET tube in place, oxygenating well. Mechanical breath sounds heard anteriorly bilaterally.   Heart: Tachycardic. S1 and S2 normal without gallop, murmur, or rubs.  Abdomen:  BS normoactive. Soft, Nondistended, non-tender.  No masses or organomegaly.  Extremities: Trace bilateral LE edema.  Neurologic:  Sedated and intubated. Does not follow commands but withdraws to pain. Able to move all 4 extremities spontaneously.   Skin: No visible rashes, scars.    Lab results: Basic Metabolic Panel: Recent Labs  Lab 07/24/18 2010 07/24/18 2345 07/25/18 0251 07/26/18 0402  NA 151* 157* 160* 154*  K 5.5* 4.5 3.5 5.0  CL 115* 124* 128* 122*  CO2 18* 20* 22 17*  GLUCOSE 729* 201* 130* 527*  BUN 85* 83* 81* 86*  CREATININE 7.11* 6.85* 6.91* 8.63*  CALCIUM 8.8* 8.1* 8.2* 7.2*  MG 3.5*  --  2.7*  --   PHOS 3.2  --  1.7*  --     Liver Function Tests: Recent Labs  Lab 07/24/18 1021 07/24/18 2010 07/26/18 0402  AST 29 33 27  ALT 45* 41 32  ALKPHOS 118 114 96  BILITOT 1.5* 1.5* 0.7  PROT 7.9 6.9 6.0*  ALBUMIN 3.5 3.0* 2.2*   Recent Labs  Lab 07/24/18 2010  AMYLASE 891*   No results for input(s): AMMONIA in the last 168 hours.  CBC: Recent Labs  Lab 07/24/18 1021  07/24/18 1704 07/25/18 0251 07/26/18 0722  WBC 11.5*  --  7.1 8.6 8.2  NEUTROABS 9.9*  --  5.3  --   --   HGB 14.2   < > 13.2 13.1 12.5  HCT 54.8*   < > 47.2* 43.4 41.3  MCV 107.2*  --  95.0 88.4 88.2  PLT 250  --  177 178 90*   < > = values in this interval not displayed.    Cardiac Enzymes: Recent Labs  Lab 07/24/18 1021 07/24/18 2010 07/24/18 2345 07/25/18 0251  TROPONINI <0.03 <0.03 0.03* 0.03*    BNP: Invalid input(s): POCBNP  CBG: Recent Labs  Lab 07/26/18 0629 07/26/18 0710 07/26/18 0809 07/26/18 0907 07/26/18 1012  GLUCAP 434* 459* 425* 387* 320*    Microbiology: Results for orders placed or performed during the hospital encounter of 07/24/18  Urine culture     Status: None   Collection Time: 07/24/18 10:20 AM  Result Value Ref Range Status   Specimen Description   Final    URINE, CLEAN CATCH Performed at [facility], [address], [place], [place] [address]    Special Requests   Final    NONE Performed at [facility], [address] [place], [place] [address]    Culture    Final    NO GROWTH Performed at [facility], [place] [address] [place], [place] [address]    Report Status 07/25/2018 FINAL  Final  Culture, blood (routine x 2)     Status: None (Preliminary result)   Collection Time: 07/24/18  4:49 PM  Result Value Ref Range Status   Specimen Description BLOOD LEFT FOOT  Final   Special Requests   Final    BOTTLES DRAWN AEROBIC ONLY Blood Culture results may not be optimal due to an inadequate volume  of blood received in culture bottles   Culture   Final    NO GROWTH < 24 HOURS Performed at [facility], [place] [address], [place], [place] [address]    Report Status PENDING  Incomplete  Urine culture     Status: None   Collection Time: 07/24/18  6:54 PM  Result Value Ref Range Status   Specimen Description URINE, RANDOM  Final   Special Requests NONE  Final   Culture   Final    NO GROWTH Performed at [facility], [address], [place], [place] [address]    Report Status 07/25/2018 FINAL  Final  MRSA PCR Screening     Status: None   Collection Time: 07/24/18  9:03 PM  Result Value Ref Range Status   MRSA by PCR NEGATIVE NEGATIVE Final    Comment:        The GeneXpert MRSA Assay (FDA approved for NASAL specimens only), is one component of a comprehensive MRSA colonization surveillance program. It is not intended to diagnose MRSA infection nor to guide or monitor treatment for MRSA infections. Performed at [facility], [place] [address], [place],  [address]     Coagulation Studies: Recent Labs    07/24/18 1021  LABPROT 14.5  INR 1.14    Urinalysis: Recent Labs    07/24/18 1020 07/24/18 1740  COLORURINE YELLOW YELLOW  LABSPEC 1.023 1.022  PHURINE 5.0 5.0  GLUCOSEU >=500* >=500*  HGBUR SMALL* LARGE*  BILIRUBINUR NEGATIVE NEGATIVE  KETONESUR 5* 5*  PROTEINUR 30* 30*  NITRITE NEGATIVE NEGATIVE  LEUKOCYTESUR TRACE* TRACE*      Imaging: Ct Head Wo Contrast  Result Date: 07/24/2018 CLINICAL  DATA:  Altered mental status, unclear cause. Seizure activity. Patient responsive only to painful stimuli. Hyperglycemia. EXAM: CT HEAD WITHOUT CONTRAST TECHNIQUE: Contiguous axial images were obtained from the base of the skull through the vertex without intravenous contrast. COMPARISON:  None. FINDINGS: Brain: No acute infarct, hemorrhage, or mass lesion is present. The ventricles are of normal size. No significant white matter disease is present. The brainstem and cerebellum are within normal limits. No significant extraaxial fluid collection is present. Vascular: No hyperdense vessel or unexpected calcification. Skull: Calvarium is intact. No acute or healing fractures are present. No focal lytic or blastic lesions are present. Sinuses/Orbits: The paranasal sinuses and mastoid air cells are clear. Bilateral exophthalmos is noted. No retro-orbital mass is present. Globes and orbits are otherwise within normal limits. IMPRESSION: 1. Normal CT appearance of brain. 2. Bilateral exophthalmos.  Question thyroid disease. Electronically Signed   By: San Morelle M.D.   On: 07/24/2018 12:58   US Renal  Result Date: 07/25/2018 CLINICAL DATA:  Acute renal injury EXAM: RENAL / URINARY TRACT ULTRASOUND COMPLETE COMPARISON:  01/12/2015 CT of the abdomen and pelvis FINDINGS: Right Kidney: Length: 13.9 cm. Echogenicity within normal limits. No mass or hydronephrosis visualized. Left Kidney: Length: 12.9 cm. Echogenicity within normal limits. No mass or hydronephrosis visualized. Bladder: Appears normal for degree of bladder distention. IMPRESSION: No acute abnormality identified. Electronically Signed   By: Inez Catalina M.D.   On: 07/25/2018 09:09   Dg Chest Port 1 View  Result Date: 07/26/2018 CLINICAL DATA:  Acute respiratory failure with hypoxia. Intubated patient. EXAM: PORTABLE CHEST 1 VIEW COMPARISON:  Portable chest x-ray of July 25, 2018 FINDINGS: The lungs are mildly hypoinflated. There is persistent  increased density in the retrocardiac region and obscuring the left hemidiaphragm. The right lung  is clear. The heart is top-normal in size. The pulmonary vascularity is mildly prominent. The endotracheal tube tip projects at the level of the clavicular heads approximately 4 cm above the carina. The esophagogastric tube appears to be coiled in a hiatal hernia with the tip in proximal port below the left hemidiaphragm. IMPRESSION: Left lower lobe atelectasis or pneumonia. Small left pleural effusion. Mild central pulmonary vascular prominence may reflect low-grade CHF. No significant interstitial edema. Coiling of the esophagogastric tube in the left retrocardiac region likely in a hiatal hernia. Electronically Signed   By: David  Martinique M.D.   On: 07/26/2018 07:03   Dg Chest Port 1 View  Result Date: 07/25/2018 CLINICAL DATA:  Acute respiratory failure EXAM: PORTABLE CHEST 1 VIEW COMPARISON:  07/24/2018 FINDINGS: Cardiac shadow is stable. Endotracheal tube is been withdrawn in the interval now lying in the distal trachea. The lungs are well aerated with the exception of minimal left basilar atelectasis. Nasogastric catheter is noted within the stomach. Postoperative changes in the neck are seen. No bony abnormality is noted. IMPRESSION: Tubes and lines in satisfactory position. Mild left basilar atelectasis is noted. Electronically Signed   By: Inez Catalina M.D.   On: 07/25/2018 06:56   Dg Chest Port 1 View  Addendum Date: 07/24/2018   ADDENDUM REPORT: 07/24/2018 11:19 ADDENDUM: These results were called by telephone at the time of interpretation on 07/24/2018 at 11:13 am to Dr. Francine Graven , who verbally acknowledged these results. Electronically Signed   By: Fidela Salisbury M.D.   On: 07/24/2018 11:19   Result Date: 07/24/2018 CLINICAL DATA:  Post intubation. EXAM: PORTABLE CHEST 1 VIEW COMPARISON:  Body CT 09/30/2012 FINDINGS: The endotracheal tube terminates in the proximal right mainstem  bronchus. Retraction by approximately 3 cm is recommended. Enteric catheter tip overlies the expected location of gastric cardia. Cardiomediastinal silhouette is normal. Mediastinal contours appear intact. There is no evidence of pneumothorax. Low lung volumes with increased interstitial markings. Elevation of the left hemidiaphragm suggestive of atelectatic changes of the left lung. Osseous structures are without acute abnormality. Soft tissues are grossly normal. IMPRESSION: Right mainstem bronchus intubation. Suggested retraction of the endotracheal tube by approximately 3 cm. Low lung volumes bilaterally with more prominent atelectatic changes of the left lung. Electronically Signed: By: Fidela Salisbury M.D. On: 07/24/2018 11:07      Assessment & Plan: Patient is a 47 y.o. female with a PMHx of uncontrolled T2DM, hypothyroidism s/p RAI due to Grave's disease, HTN, schizophrenia, and medication noncompliance x 2-3 months  who was admitted to [facility] on 07/24/2018 for evaluation acute metabolic encephalopathy and was found to be in DKA and acute renal failure.   1. Acute renal failure: Likely secondary to severe dehydration in the setting of DKA and transient hypotension after initiation of propofol for sedation (now on Versed). Could also have a component of CKD. Baseline Cr unclear but last Cr 1.2 in 06/2015. Found to have elevated creatinine on admission 7.7. Renal function unfortunately with progressive worsening despite aggressive IVF resuscitation and patient now oliguric as well with UOP 635cc today. K 5.0. Her weight is up 10 lbs and she does appear mildly hypervolemic on exam. Renal US without acute abnormalities and UA with pyuria, proteinuria, and hematuria (new, ?traumatic from Foley placement).  - No immediate need for dialysis at this time but will closely monitor  - Should be able to tolerate HD if needed  - Continue to monitor renal function closely - Strict I/Os + daily  weights  -  Avoid nephrotoxic agents  - Stopped IVF, she is net +14L and 10 lbs up since admission   2. Hypernatremia with hyperchloremia: Likely secondary to aggressive IVF resuscitation.  Calculated free water deficit 6.6L.  - Stopped IVF  - Free water 250 mL q4h per OG tube  - Continue to monitor Na   3. Thrombocytopenia: New. PLt 178--> 70. Recommend getting coags and close monitoring. No signs/symptoms of active bleeding at this time. Should also consider HIT given 50% decrease in platelets while on heparin for VTE ppx.   4. DKA: History of T2DM, only on metformin at home but does not appear she follows up with PCP. Last A1c 6.1 in 05/2015. Remains on insulin gtt with BG 300-500. Stopped IVF as above.  5. HTN: on several antihypertensive medications at home including amlodipine 10 mg QD, hydralazine 26m TID, HCTZ 25 mg QD AND lisinopril-HCTZ 10-12.5 mgQd, and metoprolol 50 mg BID. Unclear if she has been taking all of these meds at home.     5. Hypothyroidism: after RAI for Grave's disease. Appears to be non-compliant with home Synthroid. TSH 6.3. Restarted Synthroid.   6. Schizophrenia: On risperidone and Cogentin at home   I saw the patient and personally evaluated her with the resident.  Severe oliguric AKI in setting of DKA.  She was presumably hypovolemic on admission and had NSAIDs, ACE-I on her medication list which certainly could precipitate ATN.  She does not currently appear hypovolemic and in the setting of worsening renal function and oliguria have held her IV fluids today.  Pulmonary status is ok but if worsening would try aggressive dose of diuretics.  She may develop indications for dialysis in the next 24-48 and we will follow closely with you.  LJannifer HickMD

## 2018-07-26 NOTE — Progress Notes (Signed)
eLink Physician-Brief Progress Note Patient Name: [name] DOB: September 20, 1971 MRN: [id number]   Date of Service  07/26/2018  HPI/Events of Note  Blood glucose = 434  eICU Interventions  Will order: 1. Restart insulin IV infusion per DKA protocol.      Intervention Category Major Interventions: Hyperglycemia - active titration of insulin therapy  [name] Cornelia Copa 07/26/2018, 6:39 AM

## 2018-07-26 NOTE — Progress Notes (Signed)
eLink Physician-Brief Progress Note Patient Name: [name] DOB: May 18, 1971 MRN: [id number]   Date of Service  07/26/2018  HPI/Events of Note  Blood glucose = 419.  eICU Interventions  Will order: 1. Novolog Insulin 18 units [place] now.   If she continues to drift higher, she may need to go back on an Insulin IV infusion.      Intervention Category Major Interventions: Hyperglycemia - active titration of insulin therapy  [name] 07/26/2018, 3:52 AM

## 2018-07-26 NOTE — Care Management Note (Signed)
Case Management Note  Patient Details  Name: [name] MRN: [id number] Date of Birth: 14-Oct-1971  Subjective/Objective:  From home with spouse, on vent with pressure support, presented with dka due to noncompliance with meds and AKI with cret elevated, acute metabolic encephalopathy. Daily psv trials with goal for extubation once mental status permits.                    Action/Plan: NCM will follow for transition of care needs.   Expected Discharge Date:  07/31/18               Expected Discharge Plan:     In-House Referral:     Discharge planning Services  CM Consult  Post Acute Care Choice:    Choice offered to:     DME Arranged:    DME Agency:     HH Arranged:    HH Agency:     Status of Service:  In process, will continue to follow  If discussed at Long Length of Stay Meetings, dates discussed:    Additional Comments:  Zenon Mayo, RN 07/26/2018, 12:31 PM

## 2018-07-27 ENCOUNTER — Inpatient Hospital Stay (HOSPITAL_COMMUNITY): Payer: BLUE CROSS/BLUE SHIELD

## 2018-07-27 DIAGNOSIS — J9601 Acute respiratory failure with hypoxia: Secondary | ICD-10-CM

## 2018-07-27 LAB — GLUCOSE, CAPILLARY
GLUCOSE-CAPILLARY: 196 mg/dL — AB (ref 70–99)
GLUCOSE-CAPILLARY: 222 mg/dL — AB (ref 70–99)
GLUCOSE-CAPILLARY: 157 mg/dL — AB (ref 70–99)
GLUCOSE-CAPILLARY: 146 mg/dL — AB (ref 70–99)
GLUCOSE-CAPILLARY: 157 mg/dL — AB (ref 70–99)
GLUCOSE-CAPILLARY: 150 mg/dL — AB (ref 70–99)
GLUCOSE-CAPILLARY: 146 mg/dL — AB (ref 70–99)
GLUCOSE-CAPILLARY: 159 mg/dL — AB (ref 70–99)
GLUCOSE-CAPILLARY: 157 mg/dL — AB (ref 70–99)
Glucose-Capillary: 111 mg/dL — ABNORMAL HIGH (ref 70–99)
Glucose-Capillary: 121 mg/dL — ABNORMAL HIGH (ref 70–99)
Glucose-Capillary: 139 mg/dL — ABNORMAL HIGH (ref 70–99)
Glucose-Capillary: 167 mg/dL — ABNORMAL HIGH (ref 70–99)
Glucose-Capillary: 174 mg/dL — ABNORMAL HIGH (ref 70–99)
Glucose-Capillary: 174 mg/dL — ABNORMAL HIGH (ref 70–99)
Glucose-Capillary: 175 mg/dL — ABNORMAL HIGH (ref 70–99)
Glucose-Capillary: 181 mg/dL — ABNORMAL HIGH (ref 70–99)
Glucose-Capillary: 181 mg/dL — ABNORMAL HIGH (ref 70–99)
Glucose-Capillary: 194 mg/dL — ABNORMAL HIGH (ref 70–99)
Glucose-Capillary: 195 mg/dL — ABNORMAL HIGH (ref 70–99)
Glucose-Capillary: 209 mg/dL — ABNORMAL HIGH (ref 70–99)
Glucose-Capillary: 213 mg/dL — ABNORMAL HIGH (ref 70–99)
Glucose-Capillary: 230 mg/dL — ABNORMAL HIGH (ref 70–99)

## 2018-07-27 LAB — PHOSPHORUS
PHOSPHORUS: 5.4 mg/dL — AB (ref 2.5–4.6)
Phosphorus: 4.8 mg/dL — ABNORMAL HIGH (ref 2.5–4.6)
Phosphorus: 5.8 mg/dL — ABNORMAL HIGH (ref 2.5–4.6)

## 2018-07-27 LAB — MAGNESIUM
Magnesium: 2.1 mg/dL (ref 1.7–2.4)
Magnesium: 2 mg/dL (ref 1.7–2.4)

## 2018-07-27 LAB — BASIC METABOLIC PANEL
ANION GAP: 13 (ref 5–15)
BUN: 98 mg/dL — AB (ref 6–20)
CHLORIDE: 125 mmol/L — AB (ref 98–111)
CO2: 17 mmol/L — AB (ref 22–32)
Calcium: 6.7 mg/dL — ABNORMAL LOW (ref 8.9–10.3)
Creatinine, Ser: 10.9 mg/dL — ABNORMAL HIGH (ref 0.44–1.00)
GFR calc non Af Amer: 4 mL/min — ABNORMAL LOW (ref >60)
GFR, EST AFRICAN AMERICAN: 4 mL/min — AB (ref >60)
GLUCOSE: 235 mg/dL — AB (ref 70–99)
Potassium: 4.2 mmol/L (ref 3.5–5.1)
Sodium: 155 mmol/L — ABNORMAL HIGH (ref 135–145)

## 2018-07-27 LAB — CBC
HCT: 35.5 % — ABNORMAL LOW (ref 36.0–46.0)
Hemoglobin: 10.6 g/dL — ABNORMAL LOW (ref 12.0–15.0)
MCH: 26.6 pg (ref 26.0–34.0)
MCHC: 29.9 g/dL — AB (ref 30.0–36.0)
MCV: 89 fL (ref 78.0–100.0)
PLATELETS: 86 10*3/uL — AB (ref 150–400)
RBC: 3.99 MIL/uL (ref 3.87–5.11)
RDW: 17.8 % — ABNORMAL HIGH (ref 11.5–15.5)
WBC: 7.3 10*3/uL (ref 4.0–10.5)

## 2018-07-27 LAB — PROCALCITONIN: PROCALCITONIN: 6.65 ng/mL

## 2018-07-27 LAB — STREP PNEUMONIAE URINARY ANTIGEN: STREP PNEUMO URINARY ANTIGEN: NEGATIVE

## 2018-07-27 MED ORDER — DEXTROSE 10 % IV SOLN: INTRAVENOUS | Status: DC | PRN

## 2018-07-27 MED ORDER — IPRATROPIUM-ALBUTEROL 0.5-2.5 (3) MG/3ML IN SOLN: 3.0000 mL | RESPIRATORY_TRACT | Status: DC | PRN

## 2018-07-27 MED ORDER — FREE WATER
400.0000 mL | Status: DC
  Administered 2018-07-27 – 2018-07-28 (×6): 400 mL

## 2018-07-27 MED ORDER — ACETAMINOPHEN 160 MG/5ML PO SOLN
650.0000 mg | Freq: Four times a day (QID) | ORAL | Status: DC | PRN
  Administered 2018-07-27 – 2018-07-28 (×2): 650 mg
  Filled 2018-07-27 (×3): qty 20.3

## 2018-07-27 MED ORDER — INSULIN DETEMIR 100 UNIT/ML ~~LOC~~ SOLN
20.0000 [IU] | Freq: Two times a day (BID) | SUBCUTANEOUS | Status: DC
  Administered 2018-07-27 – 2018-08-05 (×15): 20 [IU] via SUBCUTANEOUS
  Filled 2018-07-27 (×20): qty 0.2

## 2018-07-27 MED ORDER — INSULIN ASPART 100 UNIT/ML ~~LOC~~ SOLN
1.0000 [IU] | SUBCUTANEOUS | Status: DC
  Administered 2018-07-27: 3 [IU] via SUBCUTANEOUS

## 2018-07-27 MED ORDER — INSULIN ASPART 100 UNIT/ML ~~LOC~~ SOLN
7.0000 [IU] | SUBCUTANEOUS | Status: DC
  Administered 2018-07-27 – 2018-08-06 (×43): 7 [IU] via SUBCUTANEOUS

## 2018-07-27 MED ORDER — VITAL HIGH PROTEIN PO LIQD
1000.0000 mL | ORAL | Status: DC
  Administered 2018-07-27 – 2018-07-28 (×2): 1000 mL

## 2018-07-27 MED ORDER — PIPERACILLIN-TAZOBACTAM IN DEX 2-0.25 GM/50ML IV SOLN
2.2500 g | Freq: Three times a day (TID) | INTRAVENOUS | Status: DC
  Administered 2018-07-27 – 2018-07-28 (×5): 2.25 g via INTRAVENOUS
  Filled 2018-07-27 (×6): qty 50

## 2018-07-27 MED ORDER — LEVOTHYROXINE SODIUM 25 MCG PO TABS
125.0000 ug | ORAL_TABLET | Freq: Every day | ORAL | Status: DC
  Administered 2018-07-28 – 2018-08-06 (×10): 125 ug
  Filled 2018-07-27 (×10): qty 1

## 2018-07-27 NOTE — Progress Notes (Signed)
Sputum sample obtained and sent down to main lab without complications.  

## 2018-07-27 NOTE — Progress Notes (Addendum)
[facility] ROUNDING NOTE   Subjective:   Interval History: Febrile overnight with Tmax 101.8. Started on Zosyn and blood and urine cultures sent. Remains tachycardic and now with soft BP but mostly maintaining MAPs above 65. Not on pressors. Remains intubated and sedated on Fentanyl gtt.   Objective:  Vital signs in last 24 hours:  Temp:  [98.2 F (36.8 C)-101.8 F (38.8 C)] 100.1 F (37.8 C) (07/30 0725) Pulse Rate:  [105-121] 109 (07/30 0800) Resp:  [13-36] 21 (07/30 0800) BP: (73-148)/(41-91) 108/62 (07/30 0800) SpO2:  [89 %-98 %] 94 % (07/30 0800) FiO2 (%):  [40 %-50 %] 40 % (07/30 0738) Weight:  [303 lb 12.7 oz (137.8 kg)] 303 lb 12.7 oz (137.8 kg) (07/30 0457)  Weight change: 10 lb 9.3 oz (4.8 kg) Filed Weights   07/25/18 0500 07/26/18 0500 07/27/18 0457  Weight: 283 lb 1.1 oz (128.4 kg) 293 lb 3.4 oz (133 kg) (!) 303 lb 12.7 oz (137.8 kg)    Intake/Output: I/O last 3 completed shifts: In: 4594.2 [I.V.:3319.1; NG/GT:750; IV Piggyback:525.1] Out: 820 [Urine:470; Emesis/NG output:350]   Intake/Output this shift:  Total I/O In: 345.8 [I.V.:45.8; NG/GT:250; IV Piggyback:50] Out: -   General: patient is intubated and sedated, does not respond to verbal stimuli or follows commands, withdraws to pain  CVS- tachycardic, no mrg  RS- mechanical breath sounds anteriorly  ABD- BS present soft non-distended EXT- trace bilateral LE edema    Basic Metabolic Panel: Recent Labs  Lab 07/24/18 2010 07/24/18 2345 07/25/18 0251 07/26/18 0402 07/26/18 2334 07/27/18 0547  NA 151* 157* 160* 154*  --  155*  K 5.5* 4.5 3.5 5.0  --  4.2  CL 115* 124* 128* 122*  --  125*  CO2 18* 20* 22 17*  --  17*  GLUCOSE 729* 201* 130* 527*  --  235*  BUN 85* 83* 81* 86*  --  98*  CREATININE 7.11* 6.85* 6.91* 8.63*  --  10.90*  CALCIUM 8.8* 8.1* 8.2* 7.2*  --  6.7*  MG 3.5*  --  2.7*  --   --  2.1  PHOS 3.2  --  1.7*  --  5.4* 4.8*    Liver Function Tests: Recent Labs   Lab 07/24/18 1021 07/24/18 2010 07/26/18 0402  AST 29 33 27  ALT 45* 41 32  ALKPHOS 118 114 96  BILITOT 1.5* 1.5* 0.7  PROT 7.9 6.9 6.0*  ALBUMIN 3.5 3.0* 2.2*   Recent Labs  Lab 07/24/18 2010  AMYLASE 891*   No results for input(s): AMMONIA in the last 168 hours.  CBC: Recent Labs  Lab 07/24/18 1021 07/24/18 1028 07/24/18 1704 07/25/18 0251 07/26/18 0722 07/27/18 0547  WBC 11.5*  --  7.1 8.6 8.2 7.3  NEUTROABS 9.9*  --  5.3  --   --   --   HGB 14.2 16.3* 13.2 13.1 12.5 10.6*  HCT 54.8* 48.0* 47.2* 43.4 41.3 35.5*  MCV 107.2*  --  95.0 88.4 88.2 89.0  PLT 250  --  177 178 90* 86*    Cardiac Enzymes: Recent Labs  Lab 07/24/18 1021 07/24/18 2010 07/24/18 2345 07/25/18 0251  TROPONINI <0.03 <0.03 0.03* 0.03*    BNP: Invalid input(s): POCBNP  CBG: Recent Labs  Lab 07/27/18 0437 07/27/18 0532 07/27/18 0634 07/27/18 0728 07/27/18 0835  GLUCAP 222* 230* 181* 174* 174*    Microbiology: Results for orders placed or performed during the hospital encounter of 07/24/18  Urine culture  Status: None   Collection Time: 07/24/18 10:20 AM  Result Value Ref Range Status   Specimen Description   Final    URINE, CLEAN CATCH Performed at [facility], [address], [place], [place] [address]    Special Requests   Final    NONE Performed at [facility], [address], [place], [place] [address]    Culture   Final    NO GROWTH Performed at [facility], [place] [address], [place], [place] [address]    Report Status 07/25/2018 FINAL  Final  Culture, blood (routine x 2)     Status: None (Preliminary result)   Collection Time: 07/24/18  4:49 PM  Result Value Ref Range Status   Specimen Description BLOOD LEFT FOOT  Final   Special Requests   Final    BOTTLES DRAWN AEROBIC ONLY Blood Culture results may not be optimal due to an inadequate volume of blood received in culture bottles   Culture   Final    NO GROWTH 2 DAYS Performed at [facility], [place] [address] [place], [place] [address]    Report Status PENDING  Incomplete  Urine culture     Status: None   Collection Time: 07/24/18  6:54 PM  Result Value Ref Range Status   Specimen Description URINE, RANDOM  Final   Special Requests NONE  Final   Culture   Final    NO GROWTH Performed at [facility], [address] [place], [place] [address]    Report Status 07/25/2018 FINAL  Final  MRSA PCR Screening     Status: None   Collection Time: 07/24/18  9:03 PM  Result Value Ref Range Status   MRSA by PCR NEGATIVE NEGATIVE Final    Comment:        The GeneXpert MRSA Assay (FDA approved for NASAL specimens only), is one component of a comprehensive MRSA colonization surveillance program. It is not intended to diagnose MRSA infection nor to guide or monitor treatment for MRSA infections. Performed at [facility], [place] [address], [place], [place] [address]   Culture, blood (routine x 2)     Status: None (Preliminary result)   Collection Time: 07/25/18  5:18 AM  Result Value Ref Range Status   Specimen Description BLOOD LEFT HAND  Final   Special Requests   Final    BOTTLES DRAWN AEROBIC ONLY Blood Culture results may not be optimal due to an inadequate volume of blood received in culture bottles   Culture   Final    NO GROWTH 1 DAY Performed at [facility], [place]  [address], [place], [place] [address]    Report Status PENDING  Incomplete    Coagulation Studies: Recent Labs    07/24/18 1021  LABPROT 14.5  INR 1.14    Urinalysis: Recent Labs    07/24/18 1020 07/24/18 1740  COLORURINE YELLOW YELLOW  LABSPEC 1.023 1.022  PHURINE 5.0 5.0  GLUCOSEU >=500* >=500*  HGBUR SMALL* LARGE*  BILIRUBINUR NEGATIVE NEGATIVE  KETONESUR 5* 5*  PROTEINUR 30* 30*  NITRITE NEGATIVE NEGATIVE  LEUKOCYTESUR TRACE* TRACE*      Imaging: Dg Chest Port 1 View  Result Date: 07/27/2018 CLINICAL DATA:  Acute respiratory failure EXAM: PORTABLE CHEST  1 VIEW COMPARISON:  07/26/2018 FINDINGS: Cardiac shadow is stable. Nasogastric catheter is noted within the stomach. Endotracheal tube is seen in satisfactory position. The overall inspiratory effort is poor with persistent left basilar changes stable  from the prior exam. No new bony abnormality is seen. IMPRESSION: Stable left basilar infiltrate. Tubes and lines as described. Electronically Signed   By: Inez Catalina M.D.   On: 07/27/2018 07:47   Dg Chest Port 1 View  Result Date: 07/26/2018 CLINICAL DATA:  Acute respiratory failure with hypoxia. Intubated patient. EXAM: PORTABLE CHEST 1 VIEW COMPARISON:  Portable chest x-ray of July 25, 2018 FINDINGS: The lungs are mildly hypoinflated. There is persistent increased density in the retrocardiac region and obscuring the left hemidiaphragm. The right lung is clear. The heart is top-normal in size. The pulmonary vascularity is mildly prominent. The endotracheal tube tip projects at the level of the clavicular heads approximately 4 cm above the carina. The esophagogastric tube appears to be coiled in a hiatal hernia with the tip in proximal port below the left hemidiaphragm. IMPRESSION: Left lower lobe atelectasis or pneumonia. Small left pleural effusion. Mild central pulmonary vascular prominence may reflect low-grade CHF. No significant interstitial edema. Coiling of the esophagogastric tube in the left retrocardiac region likely in a hiatal hernia. Electronically Signed   By: David  Martinique M.D.   On: 07/26/2018 07:03     Medications:   . sodium chloride    . sodium chloride Stopped (07/27/18 9622)  . fentaNYL infusion INTRAVENOUS 50 mcg/hr (07/27/18 0800)  . insulin (NOVOLIN-R) infusion 9.2 mL/hr at 07/27/18 0800  . piperacillin-tazobactam (ZOSYN)  IV Stopped (07/27/18 0557)   . chlorhexidine gluconate (MEDLINE KIT)  15 mL Mouth Rinse BID  . free water  250 mL Per Tube Q4H  . heparin  5,000 Units Subcutaneous Q8H  . levothyroxine  62.5 mcg  Intravenous Daily  . mouth rinse  15 mL Mouth Rinse 10 times per day  . pantoprazole sodium  40 mg Per Tube Daily   sodium chloride, Place/Maintain arterial line **AND** sodium chloride, acetaminophen (TYLENOL) oral liquid 160 mg/5 mL, dextrose, docusate, fentaNYL, midazolam  Assessment/ Plan:  Patient is a 47 y.o. female with a PMHx of uncontrolled T2DM, hypothyroidism s/p RIA due to Grave's disease, HTN, schizophrenia, and medication noncompliance x 2-3 months  who was admitted to [facility] on 07/24/2018 for evaluation acute metabolic encephalopathy and was found to be in DKA and acute renal failure.  1. Acute renal failure: Likely secondary to severe dehydration in the setting of DKA and ATN from transient hypotension after initiation of propofol for sedation as well as possible NSAID and ACEi use at home. Could also have a component of CKD. Baseline Cr unclear but last Cr 1.2 in 06/2015. Unfortunately, her renal function continues to worsen (BUN 98/Cr 10.9) and urine output now slowing down. UOP 230 cc today from 400cc yesterday. Weight is 10 lbs from yesterday and overall 28 lbs up from admission, though initial weight may be inaccurate. She is net +15L since admission K 4.2. Blood pressures are soft with MAPs 57-74. May need CRRT vs iHD. May need dialysis in the next 24 hours.  Will plan for tomorrow or sooner if she develops an acute indication.  - Continue to monitor renal function closely - Strict I/Os + daily weights  - Avoid nephrotoxic agents   2. Hypernatremia with hyperchloremia: Likely secondary to aggressive IVF resuscitation.  Calculated free water deficit 6.6L. Na 154-->155. Will increase free water flushes.  - Free water 250--> 400 mL q4h per OG tube  - Continue to monitor Na   3. Thrombocytopenia: New. Platelets continue to trend down 90-> 86. Recommend getting coags and close monitoring. No signs/symptoms  of active bleeding at this time. Should also consider HIT given 50% decrease  in platelets while on heparin for VTE ppx. Could also be secondary to increase consumption in the setting of active infection given new fever.   4. DKA: History of T2DM, only on metformin at home but does not appear she follows up with PCP. Last A1c 6.1 in 05/2015. Still on insulin gtt with BG 170-230s.   5. HTN: on several antihypertensive medications at home including amlodipine 10 mg QD, hydralazine 7m TID, HCTZ 25 mg QD AND lisinopril-HCTZ 10-12.5 mgQd, and metoprolol 50 mg BID. Unclear if she has been taking all of these meds at home.     5. Hypothyroidism: after RIA for Grave's disease. Appears to be non-compliant with home Synthroid. TSH 6.3. On  IV levothyroxine.   6. Schizophrenia: On risperidone and Cogentin at home   7. Fever: Agree with infectious workup (blood and urine cultures). CXR with stable L basilar infiltrate. Started on Zosyn.   I saw the patient and personally evaluated her.  Severe, oliguric AKI but no current indications for dialysis though I think she'll develop them in the next 24-48 hours.  Newly febrile overnight and cultures, antibiotics initiated.  D/C IVF for now so she doesn't develop pulmonary edema.  Continue free water flushes for hypernatremia, titrating as needed (increased from yesterday).  Please call u[place] clinical changes, we will be following closely.  LJannifer HickMD   LOS: 3 [name] 07/27/2018 8:54 AM

## 2018-07-27 NOTE — Progress Notes (Signed)
Pharmacy Antibiotic Note  [name] is a 47 y.o. female admitted on 07/24/2018 with AMS, now spiking fevers.  Pharmacy has been consulted for Zosyn dosing.  Pt w/ AKI.  Plan: Zosyn 2.25g IV q8h.  Height: 5\' 9"  (175.3 cm) Weight: 293 lb 3.4 oz (133 kg) IBW/kg (Calculated) : 66.2  Temp (24hrs), Avg:99.4 F (37.4 C), Min:98.2 F (36.8 C), Max:101.8 F (38.8 C)  Recent Labs  Lab 07/24/18 1021 07/24/18 1028 07/24/18 1103 07/24/18 1704 07/24/18 2010 07/24/18 2345 07/25/18 0251 07/25/18 0852 07/26/18 0402 07/26/18 0722  WBC 11.5*  --   --  7.1  --   --  8.6  --   --  8.2  CREATININE 7.72* 6.70*  --   --  7.11* 6.85* 6.91*  --  8.63*  --   LATICACIDVEN  --   --  4.2*  --  3.6* 2.4* 1.5 2.1*  --   --     Estimated Creatinine Clearance: 11.6 mL/min (A) (by C-G formula based on SCr of 8.63 mg/dL (H)).    No Known Allergies   Thank you for allowing pharmacy to be a part of this patient's care.  Wynona Neat, PharmD, BCPS  07/27/2018 3:59 AM

## 2018-07-27 NOTE — Progress Notes (Signed)
RT note: attempted SBT on patient this AM however when switched to CPAP/PSV patient's sats began to decrease to low 90s.  Suctioned patient and obtained a copious amount of thick, yellow secretions.  Placed patient back on full support with improvement in sats to 97%.  Will continue to monitor.

## 2018-07-27 NOTE — Progress Notes (Signed)
PULMONARY / CRITICAL CARE MEDICINE   Name: [name] MRN: [id number] DOB: May 21, 1971    ADMISSION DATE:  07/24/2018 CONSULTATION DATE:  07/24/2018  REFERRING MD:  EDP  CHIEF COMPLAINT:  Altered mental status, Elevated BS  HISTORY OF PRESENT ILLNESS:   47 year old with history of hypertension, lupus, goiter, paranoid schizophrenia, diabetes went to [facility] ED with altered mental status.  EMS called by family as she was sick for 3 days and lying on the couch.  Patient had seizure activity with EMS and given 5 mg of Versed. Found to have hypoglycemia with blood sugars of 1556, anion gap acidosis, elevated lactic acid. Intubated for altered mental status in the ED and transferred to [facility].  As per the family she did not have any fevers, nausea, vomiting, diarrhea, pain prior to this.  They are not aware that she has diabetes but she does have metformin on her med list.  There are no prior history of seizures.  She had not been taking any of her other medications except psychiatric medications for the past 2 to 3 months.  Family has noticed that she was having excessive urination over the past few weeks.   SUBJECTIVE:  Pt restarted on insulin overnight.  Remains on fentanyl gtt.   VITAL SIGNS: BP 108/62 (BP Location: Right Arm)   Pulse (!) 109   Temp 100.1 F (37.8 C) (Oral)   Resp (!) 21   Ht 5\' 9"  (1.753 m)   Wt (!) 303 lb 12.7 oz (137.8 kg)   LMP 09/18/2012   SpO2 94%   BMI 44.86 kg/m   HEMODYNAMICS:    VENTILATOR SETTINGS: Vent Mode: PRVC FiO2 (%):  [40 %-50 %] 40 % Set Rate:  [20 bmp] 20 bmp Vt Set:  [530 mL] 530 mL PEEP:  [5 cmH20] 5 cmH20 Pressure Support:  [10 cmH20] 10 cmH20 Plateau Pressure:  [19 cmH20-26 cmH20] 20 cmH20  INTAKE / OUTPUT: I/O last 3 completed shifts: In: 4594.2 [I.V.:3319.1; NG/GT:750; IV Piggyback:525.1] Out: 87 [Urine:470; Emesis/NG output:350]  PHYSICAL EXAMINATION: General: Obese female lying in bed on vent HEENT: MM  pink/moist, ETT, exophthalmos Neuro: Eye-opening to name, does not follow commands CV: s1s2 rrr, no m/r/g PULM: even/non-labored, lungs bilaterally clear anterior, diminished bases NO:BSJG, non-tender, bsx4 active  Extremities: warm/dry, no significant edema  Skin: no rashes or lesions  LABS:  BMET Recent Labs  Lab 07/25/18 0251 07/26/18 0402 07/27/18 0547  NA 160* 154* 155*  K 3.5 5.0 4.2  CL 128* 122* 125*  CO2 22 17* 17*  BUN 81* 86* 98*  CREATININE 6.91* 8.63* 10.90*  GLUCOSE 130* 527* 235*    Electrolytes Recent Labs  Lab 07/24/18 2010  07/25/18 0251 07/26/18 0402 07/26/18 2334 07/27/18 0547  CALCIUM 8.8*   < > 8.2* 7.2*  --  6.7*  MG 3.5*  --  2.7*  --   --  2.1  PHOS 3.2  --  1.7*  --  5.4* 4.8*   < > = values in this interval not displayed.    CBC Recent Labs  Lab 07/25/18 0251 07/26/18 0722 07/27/18 0547  WBC 8.6 8.2 7.3  HGB 13.1 12.5 10.6*  HCT 43.4 41.3 35.5*  PLT 178 90* 86*    Coag's Recent Labs  Lab 07/24/18 1021  INR 1.14    Sepsis Markers Recent Labs  Lab 07/24/18 2010 07/24/18 2345 07/25/18 0251 07/25/18 0852 07/27/18 0547  LATICACIDVEN 3.6* 2.4* 1.5 2.1*  --   PROCALCITON  1.04  --   --   --  6.65    ABG Recent Labs  Lab 07/24/18 1200 07/25/18 0412  PHART 7.232* 7.333*  PCO2ART 34.4 32.8  PO2ART 228* 85.0    Liver Enzymes Recent Labs  Lab 07/24/18 1021 07/24/18 2010 07/26/18 0402  AST 29 33 27  ALT 45* 41 32  ALKPHOS 118 114 96  BILITOT 1.5* 1.5* 0.7  ALBUMIN 3.5 3.0* 2.2*    Cardiac Enzymes Recent Labs  Lab 07/24/18 2010 07/24/18 2345 07/25/18 0251  TROPONINI <0.03 0.03* 0.03*    Glucose Recent Labs  Lab 07/27/18 0332 07/27/18 0437 07/27/18 0532 07/27/18 0634 07/27/18 0728 07/27/18 0835  GLUCAP 209* 222* 230* 181* 174* 174*    Imaging Dg Chest Port 1 View  Result Date: 07/27/2018 CLINICAL DATA:  Acute respiratory failure EXAM: PORTABLE CHEST 1 VIEW COMPARISON:  07/26/2018 FINDINGS:  Cardiac shadow is stable. Nasogastric catheter is noted within the stomach. Endotracheal tube is seen in satisfactory position. The overall inspiratory effort is poor with persistent left basilar changes stable from the prior exam. No new bony abnormality is seen. IMPRESSION: Stable left basilar infiltrate. Tubes and lines as described. Electronically Signed   By: Inez Catalina M.D.   On: 07/27/2018 07:47    STUDIES:  Renal US 7/28 >> no acute abnormality  CULTURES: BCx2 7/27 >>  BC 7/28>> UC 7/27 >> negative BC x 2 7/30>> Sputum  7/30  ANTIBIOTICS: Zosyn 7/30>>  SIGNIFICANT EVENTS: 7/27  Admit  7/28 Sedation adjusted to fentanyl drip. No follow commands 7/29 nephrology consulted  LINES/TUBES: ETT 7/27 >>  DISCUSSION: 47 year old with history of schizophrenia, goiter diabetes admitted with DKA due to non compliance with meds & AKI.  Progressive rise in serum creatinine with borderline urinary output. New fever 7/30 am  ASSESSMENT / PLAN:  PULMONARY A: Acute Respiratory Insufficiency - in the setting of acute metabolic encephalopathy  L basilar infiltrate Failed SBT 7/30 am P:   PRVC 8 cc/kg Wean PEEP/FiO2 for sats greater than 90% Follow intermittent chest x-ray Daily PSV trials as tolerated with goal for extubation once mental status permits  CARDIOVASCULAR A:  Hypotension, lactic acidosis -resolved + 15 L since admission P:  ICU monitoring  Fluids per DKA protocol  Trend lactate   RENAL A:   AKI - rising sr cr, borderline oliguric Hyperkalemia AGMA - in setting of renal failure, DKA [place] no acute abnormalities + 15 L on 7/30 P:   Trend BMP / urinary output Replace electrolytes as indicated Avoid nephrotoxic agents, ensure adequate renal perfusion Nephrology consulted 7/29 and following  appreciate input.  Keep foley catheter for strict I/O   GASTROINTESTINAL A:   Morbid Obesity  P:   PPI for SUP  Dietitian consult for TF 7/30 >> remains on insulin  gtt  Cortrak  Placement ordered   HEMATOLOGIC A:   Elevated hematocrit secondary to dehydration>> resolved Anemia P:  Trend CBC  Monitor for any obvious bleeding Transfuse for HGB < 7  INFECTIOUS A:   Temp spike to 101.8 on 7/30 early am Pro calcitonin bump 7/30 to 6.65 P:   ABX as above  Trend PCT Trend fever curve and WBC Sputum for culture Urine for strep and legionella Follow cultures   ENDOCRINE A:   DKA Goiter, exophthalmos Hypothyroidism - TSH 6.379 on admit P:   DKA protocol CBG's Q 1 while on insulin gtt Hold home metformin Continue Synthroid  NEUROLOGIC A:   Acute Metabolic Encephalopathy - AKI,  DKA, ? Post ictal Seizure- reported on admit, no hx of  CT head is unremarkable. P:   Frequent neuro exams PRN Versed for seizures Monitor off antiepileptics for now Await EEG > no further clinical seizure activity noted  FAMILY  - Updates: No Family available on a.m. 7/30 on  NP rounds.  - Inter-disciplinary family meet or Palliative Care meeting due by:  8/5  CC Time: 50 minutes   Magdalen Spatz, AGACNP-BC [facility] Pgr: [phone] 07/27/2018, 9:12 AM

## 2018-07-27 NOTE — Progress Notes (Signed)
eLink Physician-Brief Progress Note Patient Name: [name] DOB: 1971/09/29 MRN: [id number]   Date of Service  07/27/2018  HPI/Events of Note  Fever to 101.8 F - Bedside nurse states that urine is cloudy. WBC yesterday = 8.2. AST and ALT both normal.   eICU Interventions  Will order: 1. Blood cultures X 2. 2. Urine culture now.  3. Zosyn per pharmacy consultation.  4. Procalcitonin sepsis protocol. 5. Tylenol liquid 650 mg per tube Q 6 hours PRN fever.      Intervention Category Major Interventions: Infection - evaluation and management  Sommer,Steven Eugene 07/27/2018, 3:54 AM

## 2018-07-27 NOTE — Progress Notes (Signed)
Nutrition Follow-up  DOCUMENTATION CODES:   Obesity unspecified  INTERVENTION:   Tube Feeding: Vital High Protein @ 65 ml/hr Provides: 1560 kcals, 137 grams protein, 1310 ml free water. Meets 100% of needs.  With free water flushes of 400 mL q 4 hours, pt receiving total of 3710 mL of free water q 24 hours  NUTRITION DIAGNOSIS:   Inadequate oral intake related to inability to eat as evidenced by NPO status.  Being addressed via TF   GOAL:   Provide needs based on ASPEN/SCCM guidelines  Progressing  MONITOR:   I & O's, Weight trends  REASON FOR ASSESSMENT:   Ventilator    ASSESSMENT:   Patient with PMH HTN, lupus, goiter, paranoid schizophrenia, diabetes presented with AMS. Family states she was sick for 3 days and just lying on the couch. Blood sugar upon presentation was 1556, admitted for DKA, AKI.  OG tube in place Pt remains on vent support, febrile Pt remains on insulin drip, anion gap closed. Recommend continuing insulin drip with initiation of TF today Free water flushes of 400 mL q 4 hours for hypernatremia  Noted pt may require renal replacement therapy, iHD vs CRRT, in the next 24 hours  Weight trending up; pt weighed 283 pounds (128.4 kg) on admission; current wt 303 pounds (137.8 kg). Net +16L per I/O flowsheet  Labs:  BUN 98, Creatinine 10.90, sodium 155, potassium wdl, phosphorus 4.8, CBGs 111-181 Meds: insulin drip   Diet Order:   Diet Order           Diet NPO time specified  Diet effective now          EDUCATION NEEDS:   Not appropriate for education at this time  Skin:  Skin Assessment: Reviewed RN Assessment(MSAD to L breast)  Last BM:  PTA  Height:   Ht Readings from Last 1 Encounters:  07/24/18 5\' 9"  (1.753 m)    Weight:   Wt Readings from Last 1 Encounters:  07/27/18 (!) 303 lb 12.7 oz (137.8 kg)    Ideal Body Weight:  65.9 kg  BMI:  Body mass index is 44.86 kg/m.  Estimated Nutritional Needs:   Kcal:   1371-1746 kcal  Protein:  >/= 132 grams  Fluid:  >/=1.3 L/day   Kerman Passey MS, RD, LDN, CNSC [phone] Pager  [phone] Weekend/On-Call Pager

## 2018-07-28 ENCOUNTER — Inpatient Hospital Stay (HOSPITAL_COMMUNITY): Payer: BLUE CROSS/BLUE SHIELD

## 2018-07-28 DIAGNOSIS — E0810 Diabetes mellitus due to underlying condition with ketoacidosis without coma: Secondary | ICD-10-CM

## 2018-07-28 LAB — BASIC METABOLIC PANEL
Anion gap: 13 (ref 5–15)
BUN: 115 mg/dL — AB (ref 6–20)
CALCIUM: 6.5 mg/dL — AB (ref 8.9–10.3)
CO2: 17 mmol/L — ABNORMAL LOW (ref 22–32)
CREATININE: 12.54 mg/dL — AB (ref 0.44–1.00)
Chloride: 117 mmol/L — ABNORMAL HIGH (ref 98–111)
GFR calc Af Amer: 4 mL/min — ABNORMAL LOW (ref >60)
GFR, EST NON AFRICAN AMERICAN: 3 mL/min — AB (ref >60)
GLUCOSE: 378 mg/dL — AB (ref 70–99)
Potassium: 4 mmol/L (ref 3.5–5.1)
Sodium: 147 mmol/L — ABNORMAL HIGH (ref 135–145)

## 2018-07-28 LAB — LEGIONELLA PNEUMOPHILA SEROGP 1 UR AG: L. PNEUMOPHILA SEROGP 1 UR AG: NEGATIVE

## 2018-07-28 LAB — CBC
HCT: 35 % — ABNORMAL LOW (ref 36.0–46.0)
Hemoglobin: 10.8 g/dL — ABNORMAL LOW (ref 12.0–15.0)
MCH: 26.6 pg (ref 26.0–34.0)
MCHC: 30.9 g/dL (ref 30.0–36.0)
MCV: 86.2 fL (ref 78.0–100.0)
PLATELETS: 73 10*3/uL — AB (ref 150–400)
RBC: 4.06 MIL/uL (ref 3.87–5.11)
RDW: 17.8 % — AB (ref 11.5–15.5)
WBC: 5.6 10*3/uL (ref 4.0–10.5)

## 2018-07-28 LAB — RENAL FUNCTION PANEL
ALBUMIN: 1.5 g/dL — AB (ref 3.5–5.0)
Anion gap: 14 (ref 5–15)
BUN: 105 mg/dL — ABNORMAL HIGH (ref 6–20)
CALCIUM: 6.2 mg/dL — AB (ref 8.9–10.3)
CO2: 16 mmol/L — ABNORMAL LOW (ref 22–32)
Chloride: 115 mmol/L — ABNORMAL HIGH (ref 98–111)
Creatinine, Ser: 11.16 mg/dL — ABNORMAL HIGH (ref 0.44–1.00)
GFR calc Af Amer: 4 mL/min — ABNORMAL LOW (ref >60)
GFR, EST NON AFRICAN AMERICAN: 4 mL/min — AB (ref >60)
GLUCOSE: 292 mg/dL — AB (ref 70–99)
PHOSPHORUS: 4.5 mg/dL (ref 2.5–4.6)
POTASSIUM: 3.9 mmol/L (ref 3.5–5.1)
SODIUM: 145 mmol/L (ref 135–145)

## 2018-07-28 LAB — URINE CULTURE: Special Requests: NORMAL

## 2018-07-28 LAB — GLUCOSE, CAPILLARY
GLUCOSE-CAPILLARY: 313 mg/dL — AB (ref 70–99)
Glucose-Capillary: 274 mg/dL — ABNORMAL HIGH (ref 70–99)
Glucose-Capillary: 276 mg/dL — ABNORMAL HIGH (ref 70–99)
Glucose-Capillary: 248 mg/dL — ABNORMAL HIGH (ref 70–99)
Glucose-Capillary: 216 mg/dL — ABNORMAL HIGH (ref 70–99)
Glucose-Capillary: 195 mg/dL — ABNORMAL HIGH (ref 70–99)

## 2018-07-28 LAB — PHOSPHORUS
Phosphorus: 4.8 mg/dL — ABNORMAL HIGH (ref 2.5–4.6)
Phosphorus: 4.4 mg/dL (ref 2.5–4.6)

## 2018-07-28 LAB — MAGNESIUM
MAGNESIUM: 1.9 mg/dL (ref 1.7–2.4)
Magnesium: 2 mg/dL (ref 1.7–2.4)

## 2018-07-28 LAB — LACTIC ACID, PLASMA: Lactic Acid, Venous: 1.2 mmol/L (ref 0.5–1.9)

## 2018-07-28 LAB — PROCALCITONIN: Procalcitonin: 11.61 ng/mL

## 2018-07-28 MED ORDER — GERHARDT'S BUTT CREAM
TOPICAL_CREAM | Freq: Two times a day (BID) | CUTANEOUS | Status: DC
  Administered 2018-07-28 – 2018-08-03 (×12): via TOPICAL
  Administered 2018-08-04: 1 via TOPICAL
  Administered 2018-08-04: 23:00:00 via TOPICAL
  Administered 2018-08-05: 1 via TOPICAL
  Administered 2018-08-05 – 2018-08-10 (×11): via TOPICAL
  Filled 2018-07-28: qty 1

## 2018-07-28 MED ORDER — PIPERACILLIN-TAZOBACTAM 3.375 G IVPB
3.3750 g | Freq: Four times a day (QID) | INTRAVENOUS | Status: DC
  Filled 2018-07-28: qty 50

## 2018-07-28 MED ORDER — PRISMASOL BGK 4/2.5 32-4-2.5 MEQ/L IV SOLN
INTRAVENOUS | Status: DC
  Administered 2018-07-28 – 2018-08-02 (×27): via INTRAVENOUS_CENTRAL
  Filled 2018-07-28 (×41): qty 5000

## 2018-07-28 MED ORDER — ONDANSETRON HCL 4 MG/2ML IJ SOLN
4.0000 mg | Freq: Four times a day (QID) | INTRAMUSCULAR | Status: DC | PRN
  Administered 2018-08-01 – 2018-08-09 (×3): 4 mg via INTRAVENOUS
  Filled 2018-07-28 (×4): qty 2

## 2018-07-28 MED ORDER — SODIUM CHLORIDE 0.9 % FOR CRRT
INTRAVENOUS_CENTRAL | Status: DC | PRN
  Administered 2018-07-30 (×2): via INTRAVENOUS_CENTRAL
  Filled 2018-07-28: qty 1000

## 2018-07-28 MED ORDER — PHENYLEPHRINE HCL-NACL 10-0.9 MG/250ML-% IV SOLN
0.0000 ug/min | INTRAVENOUS | Status: DC
  Administered 2018-07-29: 20 ug/min via INTRAVENOUS
  Administered 2018-07-30: 60 ug/min via INTRAVENOUS
  Administered 2018-07-30: 15 ug/min via INTRAVENOUS
  Administered 2018-07-30: 30 ug/min via INTRAVENOUS
  Administered 2018-07-30: 60 ug/min via INTRAVENOUS
  Filled 2018-07-28 (×7): qty 250

## 2018-07-28 MED ORDER — INSULIN ASPART 100 UNIT/ML ~~LOC~~ SOLN
0.0000 [IU] | SUBCUTANEOUS | Status: DC
  Administered 2018-07-28: 7 [IU] via SUBCUTANEOUS
  Administered 2018-07-28: 11 [IU] via SUBCUTANEOUS
  Administered 2018-07-28: 15 [IU] via SUBCUTANEOUS
  Administered 2018-07-28: 7 [IU] via SUBCUTANEOUS
  Administered 2018-07-28: 11 [IU] via SUBCUTANEOUS
  Administered 2018-07-29 (×2): 4 [IU] via SUBCUTANEOUS
  Administered 2018-07-29 – 2018-07-30 (×5): 3 [IU] via SUBCUTANEOUS
  Administered 2018-07-30: 4 [IU] via SUBCUTANEOUS
  Administered 2018-07-31: 3 [IU] via SUBCUTANEOUS
  Administered 2018-07-31: 4 [IU] via SUBCUTANEOUS
  Administered 2018-07-31: 3 [IU] via SUBCUTANEOUS
  Administered 2018-08-01: 4 [IU] via SUBCUTANEOUS
  Administered 2018-08-01: 3 [IU] via SUBCUTANEOUS
  Administered 2018-08-01: 11 [IU] via SUBCUTANEOUS
  Administered 2018-08-01 (×2): 7 [IU] via SUBCUTANEOUS
  Administered 2018-08-01 (×2): 4 [IU] via SUBCUTANEOUS
  Administered 2018-08-02: 15 [IU] via SUBCUTANEOUS
  Administered 2018-08-02 (×2): 11 [IU] via SUBCUTANEOUS
  Administered 2018-08-02: 3 [IU] via SUBCUTANEOUS
  Administered 2018-08-02 – 2018-08-03 (×2): 7 [IU] via SUBCUTANEOUS
  Administered 2018-08-03: 11 [IU] via SUBCUTANEOUS
  Administered 2018-08-03 (×3): 7 [IU] via SUBCUTANEOUS
  Administered 2018-08-03: 4 [IU] via SUBCUTANEOUS
  Administered 2018-08-04 (×4): 7 [IU] via SUBCUTANEOUS
  Administered 2018-08-04: 11 [IU] via SUBCUTANEOUS
  Administered 2018-08-04: 7 [IU] via SUBCUTANEOUS
  Administered 2018-08-05: 11 [IU] via SUBCUTANEOUS
  Administered 2018-08-05 (×2): 7 [IU] via SUBCUTANEOUS
  Administered 2018-08-05 (×2): 4 [IU] via SUBCUTANEOUS
  Administered 2018-08-05: 7 [IU] via SUBCUTANEOUS
  Administered 2018-08-06 (×4): 4 [IU] via SUBCUTANEOUS

## 2018-07-28 MED ORDER — PRISMASOL BGK 4/2.5 32-4-2.5 MEQ/L IV SOLN
INTRAVENOUS | Status: DC
  Administered 2018-07-28 – 2018-07-31 (×8): via INTRAVENOUS_CENTRAL
  Filled 2018-07-28 (×14): qty 5000

## 2018-07-28 MED ORDER — FREE WATER
250.0000 mL | Status: DC
  Administered 2018-07-28 – 2018-08-01 (×12): 250 mL

## 2018-07-28 MED ORDER — PRISMASOL BGK 4/2.5 32-4-2.5 MEQ/L IV SOLN
INTRAVENOUS | Status: DC
  Administered 2018-07-28 – 2018-08-02 (×9): via INTRAVENOUS_CENTRAL
  Filled 2018-07-28 (×14): qty 5000

## 2018-07-28 MED ORDER — SODIUM CHLORIDE 0.9 % IV SOLN
2500.0000 mg | Freq: Once | INTRAVENOUS | Status: AC
  Administered 2018-07-28: 2500 mg via INTRAVENOUS
  Filled 2018-07-28: qty 2500

## 2018-07-28 MED ORDER — HEPARIN SODIUM (PORCINE) 1000 UNIT/ML DIALYSIS
1000.0000 [IU] | INTRAMUSCULAR | Status: DC | PRN
  Filled 2018-07-28: qty 6

## 2018-07-28 MED ORDER — SODIUM CHLORIDE 0.9 % IV BOLUS
1000.0000 mL | Freq: Once | INTRAVENOUS | Status: AC
  Administered 2018-07-28: 1000 mL via INTRAVENOUS

## 2018-07-28 MED ORDER — SODIUM CHLORIDE 0.9 % IV SOLN
1250.0000 mg | INTRAVENOUS | Status: DC
  Filled 2018-07-28: qty 1250

## 2018-07-28 NOTE — Progress Notes (Signed)
Pharmacy Antibiotic Note  [name] is a 47 y.o. female admitted on 07/24/2018 with AMS and DKA.  She developed fever and PNA and was started on Zosyn yesterday.  Tracheal aspirate culture growing Staph aureus (preliminary result) so vancomycin loading dose was given today.   Pharmacy has been consulted to continue vancomycin and hold Zosyn for now until culture returns.  If MSSA, will de-escalate to Ancef.  Patient has AKI and to start CRRT.  Tmax 101.3, WBC WNL, LA 1.2, PCT 11.61.   Plan: Vanc 1250mg  IV Q24H, start tomorrow Monitor for start/tolerance of CRRT, micro data to adjust antibiotic further   Height: 5\' 9"  (175.3 cm) Weight: (!) 304 lb 7.3 oz (138.1 kg) IBW/kg (Calculated) : 66.2  Temp (24hrs), Avg:99.9 F (37.7 C), Min:98.4 F (36.9 C), Max:101.3 F (38.5 C)  Recent Labs  Lab 07/24/18 1704 07/24/18 2010 07/24/18 2345 07/25/18 0251 07/25/18 0852 07/26/18 0402 07/26/18 0722 07/27/18 0547 07/28/18 0511 07/28/18 0515  WBC 7.1  --   --  8.6  --   --  8.2 7.3 5.6  --   CREATININE  --  7.11* 6.85* 6.91*  --  8.63*  --  10.90* 12.54*  --   LATICACIDVEN  --  3.6* 2.4* 1.5 2.1*  --   --   --   --  1.2    Estimated Creatinine Clearance: 8.1 mL/min (A) (by C-G formula based on SCr of 12.54 mg/dL (H)).    No Known Allergies   Vanc 7/31 >> Zosyn 7/30 >> 7/31  7/30 resp cx: staph aureus 7/30 blood cx: pending 7/30 urine cx: 100k unknown org 7/27 blood cx: ngtd 7/27 urine cx: neg 7/27 mrsa pcr: neg    [name] D. Mina Marble, PharmD, BCPS, [place] 07/28/2018, 2:41 PM

## 2018-07-28 NOTE — Progress Notes (Signed)
eLink Physician-Brief Progress Note Patient Name: [name] DOB: [birth year] MRN: [id number]   Date of Service  07/28/2018  HPI/Events of Note  Vomiting x 2 following a 250 ml bolus of free water. Bowel sound diminished. Abdomen soft & non-tender. Mild ileus?  eICU Interventions  Zofran 4 mg iv q 6 hrs prn nausea/ vomiting. Hold tube feeding and resume in 2 hours. Will consider KUB if vomiting recurs.        Kerry Kass [name] 07/28/2018, 8:54 PM

## 2018-07-28 NOTE — Progress Notes (Addendum)
[facility] ROUNDING NOTE   Subjective:   Interval History: Remains febrile with Tmax 101.3 while on Zosyn. Also tachycardic with HR in the 100s and hypotensive with sBP 80-90s. Not on pressor support. Respiratory status improving, now on PSV and tolerating well.   Objective:  Vital signs in last 24 hours:  Temp:  [98.4 F (36.9 C)-101.3 F (38.5 C)] 98.4 F (36.9 C) (07/31 0700) Pulse Rate:  [104-113] 106 (07/31 0800) Resp:  [15-23] 19 (07/31 0800) BP: (81-126)/(44-79) 94/56 (07/31 0800) SpO2:  [92 %-98 %] 96 % (07/31 0800) FiO2 (%):  [40 %] 40 % (07/31 0729) Weight:  [304 lb 7.3 oz (138.1 kg)] 304 lb 7.3 oz (138.1 kg) (07/31 0447)  Weight change: 10.6 oz (0.3 kg) Filed Weights   07/26/18 0500 07/27/18 0457 07/28/18 0447  Weight: 293 lb 3.4 oz (133 kg) (!) 303 lb 12.7 oz (137.8 kg) (!) 304 lb 7.3 oz (138.1 kg)    Intake/Output: I/O last 3 completed shifts: In: 3598.6 [I.V.:680.6; NG/GT:2310; IV Piggyback:608] Out: 320 [Urine:320]   Intake/Output this shift:  Total I/O In: 961.3 [NG/GT:400; IV Piggyback:561.3] Out: -   General - ill-appearing female, sedated and intubated, does not follow commands  CVS- RRR, no mrg  RS- mechanical breath sounds anteriorly  ABD- BS present soft non-distended EXT- trace bilateral LE edema   Basic Metabolic Panel: Recent Labs  Lab 07/24/18 2010 07/24/18 2345 07/25/18 0251 07/26/18 0402 07/26/18 2334 07/27/18 0547 07/27/18 1602 07/28/18 0511  NA 151* 157* 160* 154*  --  155*  --  147*  K 5.5* 4.5 3.5 5.0  --  4.2  --  4.0  CL 115* 124* 128* 122*  --  125*  --  117*  CO2 18* 20* 22 17*  --  17*  --  17*  GLUCOSE 729* 201* 130* 527*  --  235*  --  378*  BUN 85* 83* 81* 86*  --  98*  --  115*  CREATININE 7.11* 6.85* 6.91* 8.63*  --  10.90*  --  12.54*  CALCIUM 8.8* 8.1* 8.2* 7.2*  --  6.7*  --  6.5*  MG 3.5*  --  2.7*  --   --  2.1 2.0 2.0  PHOS 3.2  --  1.7*  --  5.4* 4.8* 5.8* 4.8*    Liver Function  Tests: Recent Labs  Lab 07/24/18 1021 07/24/18 2010 07/26/18 0402  AST 29 33 27  ALT 45* 41 32  ALKPHOS 118 114 96  BILITOT 1.5* 1.5* 0.7  PROT 7.9 6.9 6.0*  ALBUMIN 3.5 3.0* 2.2*   Recent Labs  Lab 07/24/18 2010  AMYLASE 891*   No results for input(s): AMMONIA in the last 168 hours.  CBC: Recent Labs  Lab 07/24/18 1021  07/24/18 1704 07/25/18 0251 07/26/18 0722 07/27/18 0547 07/28/18 0511  WBC 11.5*  --  7.1 8.6 8.2 7.3 5.6  NEUTROABS 9.9*  --  5.3  --   --   --   --   HGB 14.2   < > 13.2 13.1 12.5 10.6* 10.8*  HCT 54.8*   < > 47.2* 43.4 41.3 35.5* 35.0*  MCV 107.2*  --  95.0 88.4 88.2 89.0 86.2  PLT 250  --  177 178 90* 86* 73*   < > = values in this interval not displayed.    Cardiac Enzymes: Recent Labs  Lab 07/24/18 1021 07/24/18 2010 07/24/18 2345 07/25/18 0251  TROPONINI <0.03 <0.03 0.03* 0.03*    BNP:  Invalid input(s): POCBNP  CBG: Recent Labs  Lab 07/27/18 2047 07/27/18 2137 07/27/18 2302 07/28/18 0313 07/28/18 0705  GLUCAP 181* 194* 213* 274* 313*    Microbiology: Results for orders placed or performed during the hospital encounter of 07/24/18  Urine culture     Status: None   Collection Time: 07/24/18 10:20 AM  Result Value Ref Range Status   Specimen Description   Final    URINE, CLEAN CATCH Performed at [facility], [address] [place], [place] [address]    Special Requests   Final    NONE Performed at [facility], [address] [place], [place] [address]    Culture   Final    NO GROWTH Performed at [facility], [place] [address], [place], [place] [address]    Report Status 07/25/2018 FINAL  Final  Culture, blood (routine x 2)     Status: None (Preliminary result)   Collection Time: 07/24/18  4:49 PM  Result Value Ref Range Status   Specimen Description BLOOD LEFT FOOT  Final   Special Requests   Final    BOTTLES DRAWN AEROBIC ONLY Blood Culture results may not be optimal due to an inadequate volume of blood  received in culture bottles   Culture   Final    NO GROWTH 3 DAYS Performed at [facility], [place] [address], [place], [place] [address]    Report Status PENDING  Incomplete  Urine culture     Status: None   Collection Time: 07/24/18  6:54 PM  Result Value Ref Range Status   Specimen Description URINE, RANDOM  Final   Special Requests NONE  Final   Culture   Final    NO GROWTH Performed at [facility], [address], [place], [place] [address]    Report Status 07/25/2018 FINAL  Final  MRSA PCR Screening     Status: None   Collection Time: 07/24/18  9:03 PM  Result Value Ref Range Status   MRSA by PCR NEGATIVE NEGATIVE Final    Comment:        The GeneXpert MRSA Assay (FDA approved for NASAL specimens only), is one component of a comprehensive MRSA colonization surveillance program. It is not intended to diagnose MRSA infection nor to guide or monitor treatment for MRSA infections. Performed at [facility], [place] [address] [place], [place] [address]   Culture, blood (routine x 2)     Status: None (Preliminary result)   Collection Time: 07/25/18  5:18 AM  Result Value Ref Range Status   Specimen Description BLOOD LEFT HAND  Final   Special Requests   Final    BOTTLES DRAWN AEROBIC ONLY Blood Culture results may not be optimal due to an inadequate volume of blood received in culture bottles   Culture   Final    NO GROWTH 2 DAYS Performed at [facility], [place] [address], [place], [place] [address]    Report Status PENDING  Incomplete  Urine Culture     Status: Abnormal (Preliminary result)   Collection Time: 07/27/18  4:03 AM  Result Value Ref Range Status   Specimen Description URINE, CATHETERIZED  Final   Special Requests Normal  Final   Culture (A)  Final    >=100,000 COLONIES/mL UNIDENTIFIED ORGANISM Performed at [facility], [place] [address], [place],  [address]    Report Status PENDING  Incomplete  Culture, respiratory  (non-expectorated)  Status: None (Preliminary result)   Collection Time: 07/27/18 11:35 AM  Result Value Ref Range Status   Specimen Description TRACHEAL ASPIRATE  Final   Special Requests NONE  Final   Gram Stain   Final    ABUNDANT DEGENERATED CELLULAR MATERIAL PRESENT ABUNDANT GRAM POSITIVE COCCI IN PAIRS MODERATE GRAM VARIABLE ROD Performed at [facility], [place] [address] [place], [place] [address]    Culture PENDING  Incomplete   Report Status PENDING  Incomplete    Coagulation Studies: No results for input(s): LABPROT, INR in the last 72 hours.  Urinalysis: No results for input(s): COLORURINE, LABSPEC, PHURINE, GLUCOSEU, HGBUR, BILIRUBINUR, KETONESUR, PROTEINUR, UROBILINOGEN, NITRITE, LEUKOCYTESUR in the last 72 hours.  Invalid input(s): APPERANCEUR    Imaging: Dg Chest Port 1 View  Result Date: 07/28/2018 CLINICAL DATA:  Respiratory failure. EXAM: PORTABLE CHEST 1 VIEW COMPARISON:  Radiograph of July 27, 2018. FINDINGS: Stable cardiomegaly. Endotracheal and nasogastric tubes are unchanged in position. No pneumothorax is noted. Mild bibasilar opacities are noted consistent with subsegmental atelectasis or infiltrates. Probable mild left pleural effusion is noted. Bony thorax is unremarkable. IMPRESSION: Stable support apparatus. Bibasilar subsegmental atelectasis or infiltrates are noted. Small left pleural effusion is noted. Electronically Signed   By: Marijo Conception, M.D.   On: 07/28/2018 07:14   Dg Chest Port 1 View  Result Date: 07/27/2018 CLINICAL DATA:  Acute respiratory failure EXAM: PORTABLE CHEST 1 VIEW COMPARISON:  07/26/2018 FINDINGS: Cardiac shadow is stable. Nasogastric catheter is noted within the stomach. Endotracheal tube is seen in satisfactory position. The overall inspiratory effort is poor with persistent left basilar changes stable from the prior exam. No new bony abnormality is seen. IMPRESSION: Stable left basilar infiltrate. Tubes and lines as  described. Electronically Signed   By: Inez Catalina M.D.   On: 07/27/2018 07:47     Medications:   . sodium chloride    . sodium chloride Stopped (07/28/18 0332)  . feeding supplement (VITAL HIGH PROTEIN) 65 mL/hr at 07/28/18 0600  . fentaNYL infusion INTRAVENOUS 50 mcg/hr (07/28/18 0634)  . piperacillin-tazobactam (ZOSYN)  IV Stopped (07/28/18 0402)   . chlorhexidine gluconate (MEDLINE KIT)  15 mL Mouth Rinse BID  . free water  400 mL Per Tube Q4H  . heparin  5,000 Units Subcutaneous Q8H  . insulin aspart  0-20 Units Subcutaneous Q4H  . insulin aspart  7 Units Subcutaneous Q4H  . insulin detemir  20 Units Subcutaneous Q12H  . levothyroxine  125 mcg Per Tube QAC breakfast  . mouth rinse  15 mL Mouth Rinse 10 times per day  . pantoprazole sodium  40 mg Per Tube Daily   sodium chloride, Place/Maintain arterial line **AND** sodium chloride, acetaminophen (TYLENOL) oral liquid 160 mg/5 mL, dextrose, docusate, fentaNYL, ipratropium-albuterol, midazolam  Assessment/ Plan:  Patient is a47 y.o.femalewith a PMHx of uncontrolled T2DM, hypothyroidisms/p RIA due to Grave's disease, HTN,schizophrenia, and medication noncompliance x 2-3 monthswho was admitted to [facility] on 7/27/2019for evaluationacute metabolic encephalopathy and was found to be in DKA and acute renal failure.  1. Severe oliguric AKI: Likely secondary to severe dehydration in the setting of DKA and ATN from transient hypotension after initiation of propofol for sedation as well as possible NSAID and ACEi use at home.Could also have a component of CKD. Baseline Cr unclear but last Cr 1.2 in 06/2015.Unfortunately, her renal function continues to worsen (BUN 115/Cr 12.5) and urine output remains marginal, 240cc. Net +18.8L since admission. Weight up 1 lb. K 4.0.  Blood pressures are soft with MAPs below 65 this AM. Not on pressor support at this time.  - Will initiate CRRT today as patient unlikely to tolerate HD due to hypotension  with low MAPs - Will need HD cath placement to start CRRT   - Continue to monitor renal function closely - Strict I/Os + daily weights  - Avoid nephrotoxic agents   2. Hypernatremiawith hyperchloremia: Improving. Will continue free water flushes and titrate as needed.   - Free water 400 mL q4h per OG tube  - Continue to monitor Na  3. Thrombocytopenia:New.Platelet continue to trend down and Hgb now 2 point down 12.8-> 10.8. DDx: sepsis, HIT, DIC. Recommend further workup.   4. TJW:WZLYTSS of T2DM, only on metformin at home but does not appear she follows up with PCP. Last A1c 6.1 in 05/2015. Now off insulin gtt and transitioned to SQ insulin. BG still 200-300s.   5. HTN: on several antihypertensive medications at home including amlodipine 10 mg QD, hydralazine 30m TID, HCTZ 25 mg QD AND lisinopril-HCTZ 10-12.5 mgQd, and metoprolol 50 mg BID. Unclear if she has been taking all of these meds at home.  5. Hypothyroidism:after RAI for Grave's disease. On levothyroxine.  6. Schizophrenia:On risperidone and Cogentin at home  7. Fever: Agree with infectious workup (blood and urine cultures). CXR with stable L basilar infiltrate. Started on Zosyn. Ucx positive, awaiting speciation.    LOS: 4 [name] 07/28/2018 8:44 AM   I personally saw and evaluated the patient.  Worsening oliguric, severe AKI now with volume overload.  Initiate CRRT given marginal BPs currently (sepsis).  Appreciate CCM placing catheter.  Please call with concerns.  LJannifer HickMD

## 2018-07-28 NOTE — Progress Notes (Signed)
RT note: upon arrival to do AM assessment and perform SBT, noted that patient's ETT was at 20cm at the lip, advanced back to 23cm at the lip.  After advancing ETT placed patient on CPAP/PSV 10/5 at 0730 and patient is currently tolerating well.  Will continue to monitor.

## 2018-07-28 NOTE — Progress Notes (Signed)
2000  Patient vomited twice for approx.  150 ml emesis.  Suctioned mouth out and turned off tube feeds.  No evidence noted of aspiration at this time.  MD

## 2018-07-28 NOTE — Procedures (Signed)
Hemodialysis Insertion Procedure Note GILDA ABBOUD [id number] 12-20-1971  Procedure: Insertion of Hemodialysis Catheter Type: 3 port  Indications: Hemodialysis   Procedure Details Consent: Risks of procedure as well as the alternatives and risks of each were explained to the (patient/caregiver).  Consent for procedure obtained. Time Out: Verified patient identification, verified procedure, site/side was marked, verified correct patient position, special equipment/implants available, medications/allergies/relevent history reviewed, required imaging and test results available.  Performed  Maximum sterile technique was used including antiseptics, cap, gloves, gown, hand hygiene, mask and sheet. Skin prep: Chlorhexidine; local anesthetic administered A antimicrobial bonded/coated triple lumen catheter was placed in the right internal jugular vein using the Seldinger technique. Ultrasound guidance used.Yes.   Catheter placed to 16 cm. Blood aspirated via all 3 ports and then flushed x 3. Line sutured x 2 and dressing applied.  Evaluation Blood flow good Complications: No apparent complications Patient did tolerate procedure well. Chest X-ray ordered to verify placement.  CXR: pending       Richardson Landry Samanthajo Payano ACNP Maryanna Shape PCCM Pager [phone] till 1 pm If no answer page [phone] 07/28/2018, 11:06 AM

## 2018-07-28 NOTE — Progress Notes (Signed)
Inpatient Diabetes Program Recommendations  AACE/ADA: New Consensus Statement on Inpatient Glycemic Control (2015)  Target Ranges:  Prepandial:   less than 140 mg/dL      Peak postprandial:   less than 180 mg/dL (1-2 hours)      Critically ill patients:  140 - 180 mg/dL   Lab Results  Component Value Date   GLUCAP 276 (H) 07/28/2018   HGBA1C 6.1 (H) 06/03/2015    Review of Glycemic Control Results for [name], [name] (MRN [id number]) as of 07/28/2018 13:33  Ref. Range 07/27/2018 21:37 07/27/2018 23:02 07/28/2018 03:13 07/28/2018 07:05 07/28/2018 11:10  Glucose-Capillary Latest Ref Range: 70 - 99 mg/dL 194 (H) 213 (H) 274 (H) 313 (H) 276 (H)   Diabetes history: No previous history noted Current orders for Inpatient glycemic control:  Novolog resistant q 4 hours, Novolog 7 units q 4 hours, Levemir 20 units bid  Inpatient Diabetes Program Recommendations:   Note that CBG's increased after transition off insulin drip.  May need to restart insulin drip if CBG's remain increased.    Thanks,  Adah Perl, RN, BC-ADM Inpatient Diabetes Coordinator Pager [phone] (8a-5p)

## 2018-07-28 NOTE — Progress Notes (Signed)
Upon arrival to do 12:00 ventilator check, noted that patient had been switched back to full support.  Patient was placed on full support due to catheter placement.  Also changed patient's ETT holder due to getting soiled during procedure.  Patient tolerating well.  Will continue to monitor.

## 2018-07-28 NOTE — Consult Note (Signed)
[place] Nurse wound consult note Reason for Consult:Ongoing MASD to breast folds.  New onset partial thickness skin loss to bilateral buttocks. Lower extremities are edematous today Wound type:Ongoing MASD now involving buttocks. Will begin barrier cream with antifungal  Pressure Injury POA: NA Measurement:4 cm x 2 cm x 0.1 cm  Wound INO:MVEH and moist Drainage (amount, consistency, odor) minimal weeping Periwound:intact, edema to lower extremities Dressing procedure/placement/frequency:Gerhardts butt paste twice daily.  Will not follow at this time.  Please re-consult if needed.  Domenic Moras RN BSN [place] Pager [phone]

## 2018-07-28 NOTE — Progress Notes (Addendum)
PULMONARY / CRITICAL CARE MEDICINE   Name: [name] MRN: [id number] DOB: Dec 16, 1971    ADMISSION DATE:  07/24/2018 CONSULTATION DATE:  07/24/2018  REFERRING MD:  EDP  CHIEF COMPLAINT:  Altered mental status, Elevated BS  HISTORY OF PRESENT ILLNESS:   47 year old with history of hypertension, lupus, goiter, paranoid schizophrenia, diabetes went to [facility] ED with altered mental status.  EMS called by family as she was sick for 3 days and lying on the couch.  Patient had seizure activity with EMS and given 5 mg of Versed. Found to have hypoglycemia with blood sugars of 1556, anion gap acidosis, elevated lactic acid. Intubated for altered mental status in the ED and transferred to [facility].  As per the family she did not have any fevers, nausea, vomiting, diarrhea, pain prior to this.  They are not aware that she has diabetes but she does have metformin on her med list.  There are no prior history of seizures.  She had not been taking any of her other medications except psychiatric medications for the past 2 to 3 months.  Family has noticed that she was having excessive urination over the past few weeks.   SUBJECTIVE: Placed hemodialysis catheter right IJ.  VITAL SIGNS: BP (!) 103/58   Pulse (!) 108   Temp 98.4 F (36.9 C) (Oral)   Resp 20   Ht 5\' 9"  (1.753 m)   Wt (!) 304 lb 7.3 oz (138.1 kg)   LMP 09/18/2012   SpO2 94%   BMI 44.96 kg/m   HEMODYNAMICS:    VENTILATOR SETTINGS: Vent Mode: PSV;CPAP FiO2 (%):  [40 %] 40 % Set Rate:  [20 bmp] 20 bmp Vt Set:  [530 mL] 530 mL PEEP:  [5 cmH20] 5 cmH20 Pressure Support:  [10 cmH20] 10 cmH20 Plateau Pressure:  [15 cmH20-21 cmH20] 19 cmH20  INTAKE / OUTPUT: I/O last 3 completed shifts: In: 3598.6 [I.V.:680.6; NG/GT:2310; IV Piggyback:608] Out: 320 [Urine:320]  PHYSICAL EXAMINATION: General: Morbidly obese female full mechanical ventilatory support HEENT: Edema is noted.  Endotracheal tube in place Neuro:  Following verbal commands CV: Sounds are regular PULM: even/non-labored, lungs bilaterally coarse rhonchi IW:PYKD, non-tender, bsx4 active  Extremities: warm/dry, 3+ edema  Skin: no rashes or lesions   LABS:  BMET Recent Labs  Lab 07/26/18 0402 07/27/18 0547 07/28/18 0511  NA 154* 155* 147*  K 5.0 4.2 4.0  CL 122* 125* 117*  CO2 17* 17* 17*  BUN 86* 98* 115*  CREATININE 8.63* 10.90* 12.54*  GLUCOSE 527* 235* 378*    Electrolytes Recent Labs  Lab 07/26/18 0402  07/27/18 0547 07/27/18 1602 07/28/18 0511  CALCIUM 7.2*  --  6.7*  --  6.5*  MG  --   --  2.1 2.0 2.0  PHOS  --    < > 4.8* 5.8* 4.8*   < > = values in this interval not displayed.    CBC Recent Labs  Lab 07/26/18 0722 07/27/18 0547 07/28/18 0511  WBC 8.2 7.3 5.6  HGB 12.5 10.6* 10.8*  HCT 41.3 35.5* 35.0*  PLT 90* 86* 73*    Coag's Recent Labs  Lab 07/24/18 1021  INR 1.14    Sepsis Markers Recent Labs  Lab 07/24/18 2010  07/25/18 0251 07/25/18 0852 07/27/18 0547 07/28/18 0511 07/28/18 0515  LATICACIDVEN 3.6*   < > 1.5 2.1*  --   --  1.2  PROCALCITON 1.04  --   --   --  6.65 11.61  --    < > =  values in this interval not displayed.    ABG Recent Labs  Lab 07/24/18 1200 07/25/18 0412  PHART 7.232* 7.333*  PCO2ART 34.4 32.8  PO2ART 228* 85.0    Liver Enzymes Recent Labs  Lab 07/24/18 1021 07/24/18 2010 07/26/18 0402  AST 29 33 27  ALT 45* 41 32  ALKPHOS 118 114 96  BILITOT 1.5* 1.5* 0.7  ALBUMIN 3.5 3.0* 2.2*    Cardiac Enzymes Recent Labs  Lab 07/24/18 2010 07/24/18 2345 07/25/18 0251  TROPONINI <0.03 0.03* 0.03*    Glucose Recent Labs  Lab 07/27/18 1945 07/27/18 2047 07/27/18 2137 07/27/18 2302 07/28/18 0313 07/28/18 0705  GLUCAP 157* 181* 194* 213* 274* 313*    Imaging Dg Chest Port 1 View  Result Date: 07/28/2018 CLINICAL DATA:  Respiratory failure. EXAM: PORTABLE CHEST 1 VIEW COMPARISON:  Radiograph of July 27, 2018. FINDINGS: Stable  cardiomegaly. Endotracheal and nasogastric tubes are unchanged in position. No pneumothorax is noted. Mild bibasilar opacities are noted consistent with subsegmental atelectasis or infiltrates. Probable mild left pleural effusion is noted. Bony thorax is unremarkable. IMPRESSION: Stable support apparatus. Bibasilar subsegmental atelectasis or infiltrates are noted. Small left pleural effusion is noted. Electronically Signed   By: Marijo Conception, M.D.   On: 07/28/2018 07:14    STUDIES:  Renal US 7/28 >> no acute abnormality  CULTURES: BCx2 7/27 >>  BC 7/28>> UC 7/27 >> negative Cultures from 02/2018>> greater than 100,000 colonies unidentified organism>> BC x 2 7/30>>  Sputum  7/30>> sa pending SS>>  ANTIBIOTICS: Zosyn 7/30>> Vanc 7/31 x 1 dose till cultures back>>  SIGNIFICANT EVENTS: 7/27  Admit  7/28 Sedation adjusted to fentanyl drip. No follow commands 7/29 nephrology consulted  LINES/TUBES: ETT 7/27 >> 07/28/2018 right IJ hemodialysis cath>>  DISCUSSION: 47 year old with history of schizophrenia, goiter diabetes admitted with DKA due to non compliance with meds & AKI.  Progressive rise in serum creatinine with borderline urinary output. New fever 7/30 am. 07/28/2018 renal function continues to worsen she will require a hemodialysis catheter placement.  ASSESSMENT / PLAN:  PULMONARY A: Acute Respiratory Insufficiency - in the setting of acute metabolic encephalopathy  L basilar infiltrate Failed SBT 7/30 am P:   Vent bundle Daily wake-up assessment Mental status prevents any consideration of extubation.  CARDIOVASCULAR A:  Hypotension, lactic acidosis -resolved + 15 L since admission P:   ICU monitoring Pressor support as needed  RENAL Lab Results  Component Value Date   CREATININE 12.54 (H) 07/28/2018   CREATININE 10.90 (H) 07/27/2018   CREATININE 8.63 (H) 07/26/2018   CREATININE 0.84 10/22/2012   Recent Labs  Lab 07/26/18 0402 07/27/18 0547  07/28/18 0511  K 5.0 4.2 4.0   Recent Labs  Lab 07/26/18 0402 07/27/18 0547 07/28/18 0511  NA 154* 155* 147*  ' A:   AKI - rising sr cr, borderline oliguric Hyperkalemia resolved AGMA - in setting of renal failure, DKA [place] no acute abnormalities + 15 L on 7/30 P:   Nephrology follow-up Hemodialysis cath to be placed on 07/28/2018 CVVH per nephrology  GASTROINTESTINAL A:   Morbid Obesity  P:   PPI for SUP  Dietitian consult for TF 7/30  Cortrak  Placement ordered   HEMATOLOGIC Recent Labs    07/27/18 0547 07/28/18 0511  HGB 10.6* 10.8*    A:   Elevated hematocrit secondary to dehydration>> resolved Anemia P:  Follow CBC  INFECTIOUS A:   Temp spike to 101.8 on 7/30 early am Pro calcitonin bump 7/30  to 6.65 P:   Continue antimicrobial therapy, one dose of vanc 7/31 sa in sputum>> Temperature curve follow  ENDOCRINE CBG (last 3)  Recent Labs    07/27/18 2302 07/28/18 0313 07/28/18 0705  GLUCAP 213* 274* 313*    A:   DKA Goiter, exophthalmos Hypothyroidism - TSH 6.379 on admit P:   DKA protocol Currently off insulin drip   NEUROLOGIC A:   Acute Metabolic Encephalopathy - AKI, DKA, ? Post ictal Seizure- reported on admit, no hx of  CT head is unremarkable. P:   Examinations in stable PRN Versed   FAMILY  - Updates: 07/28/2018 no family at bedside.  - Inter-disciplinary family meet or Palliative Care meeting due by:  8/5  CC Time: 35 minutes   Richardson Landry Minor ACNP Maryanna Shape PCCM Pager [phone] till 1 pm If no answer page [phone] 07/28/2018, 10:15 AM

## 2018-07-29 ENCOUNTER — Inpatient Hospital Stay (HOSPITAL_COMMUNITY): Payer: BLUE CROSS/BLUE SHIELD

## 2018-07-29 DIAGNOSIS — E1311 Other specified diabetes mellitus with ketoacidosis with coma: Secondary | ICD-10-CM

## 2018-07-29 LAB — BASIC METABOLIC PANEL
Anion gap: 11 (ref 5–15)
BUN: 67 mg/dL — ABNORMAL HIGH (ref 6–20)
CHLORIDE: 110 mmol/L (ref 98–111)
CO2: 22 mmol/L (ref 22–32)
Calcium: 7.1 mg/dL — ABNORMAL LOW (ref 8.9–10.3)
Creatinine, Ser: 7.52 mg/dL — ABNORMAL HIGH (ref 0.44–1.00)
GFR calc non Af Amer: 6 mL/min — ABNORMAL LOW (ref >60)
GFR, EST AFRICAN AMERICAN: 7 mL/min — AB (ref >60)
Glucose, Bld: 171 mg/dL — ABNORMAL HIGH (ref 70–99)
Potassium: 3.6 mmol/L (ref 3.5–5.1)
SODIUM: 143 mmol/L (ref 135–145)

## 2018-07-29 LAB — CULTURE, BLOOD (ROUTINE X 2): CULTURE: NO GROWTH

## 2018-07-29 LAB — GLUCOSE, CAPILLARY
GLUCOSE-CAPILLARY: 100 mg/dL — AB (ref 70–99)
GLUCOSE-CAPILLARY: 103 mg/dL — AB (ref 70–99)
GLUCOSE-CAPILLARY: 116 mg/dL — AB (ref 70–99)
GLUCOSE-CAPILLARY: 148 mg/dL — AB (ref 70–99)
Glucose-Capillary: 155 mg/dL — ABNORMAL HIGH (ref 70–99)
Glucose-Capillary: 127 mg/dL — ABNORMAL HIGH (ref 70–99)
Glucose-Capillary: 85 mg/dL (ref 70–99)

## 2018-07-29 LAB — RENAL FUNCTION PANEL
Albumin: 1.6 g/dL — ABNORMAL LOW (ref 3.5–5.0)
Anion gap: 11 (ref 5–15)
BUN: 37 mg/dL — ABNORMAL HIGH (ref 6–20)
CO2: 23 mmol/L (ref 22–32)
Calcium: 7.3 mg/dL — ABNORMAL LOW (ref 8.9–10.3)
Chloride: 107 mmol/L (ref 98–111)
Creatinine, Ser: 4.2 mg/dL — ABNORMAL HIGH (ref 0.44–1.00)
GFR, EST AFRICAN AMERICAN: 13 mL/min — AB (ref >60)
GFR, EST NON AFRICAN AMERICAN: 11 mL/min — AB (ref >60)
Glucose, Bld: 91 mg/dL (ref 70–99)
POTASSIUM: 3.8 mmol/L (ref 3.5–5.1)
Phosphorus: 2.6 mg/dL (ref 2.5–4.6)
Sodium: 141 mmol/L (ref 135–145)

## 2018-07-29 LAB — PHOSPHORUS: PHOSPHORUS: 4 mg/dL (ref 2.5–4.6)

## 2018-07-29 LAB — MAGNESIUM: MAGNESIUM: 2 mg/dL (ref 1.7–2.4)

## 2018-07-29 LAB — PROCALCITONIN: Procalcitonin: 5.97 ng/mL

## 2018-07-29 MED ORDER — METOCLOPRAMIDE HCL 5 MG/ML IJ SOLN
5.0000 mg | Freq: Four times a day (QID) | INTRAMUSCULAR | Status: AC
  Administered 2018-07-29 – 2018-07-31 (×8): 5 mg via INTRAVENOUS
  Filled 2018-07-29 (×8): qty 2

## 2018-07-29 MED ORDER — CEFAZOLIN SODIUM-DEXTROSE 2-4 GM/100ML-% IV SOLN
2.0000 g | Freq: Two times a day (BID) | INTRAVENOUS | Status: DC
  Administered 2018-07-29 – 2018-08-02 (×9): 2 g via INTRAVENOUS
  Filled 2018-07-29 (×9): qty 100

## 2018-07-29 MED ORDER — VITAL HIGH PROTEIN PO LIQD
1000.0000 mL | ORAL | Status: DC
  Administered 2018-07-29 – 2018-07-30 (×3): 1000 mL
  Filled 2018-07-29: qty 1000

## 2018-07-29 NOTE — Progress Notes (Signed)
eLink Physician-Brief Progress Note Patient Name: [name] DOB: [birth year] MRN: [id number]   Date of Service  07/29/2018  HPI/Events of Note  Hypoactive bowels without frank ileus-likely multifactorial including Fentanyl, diabetic neuropathy, immobility & critical illness.  eICU Interventions  Trial of Reglan 5 mg iv Q 6 hrs along with close monitoring while continuing trickle feeds        [name] 07/29/2018, 8:17 PM

## 2018-07-29 NOTE — Progress Notes (Signed)
2115  Started tube feeds at 71ml/hr at 2000.  Patient vomited tube feeds.  Suctioned mouth.  Aspiration NOT noted at this time.  MD called.  Tube feeds stopped.  Will do an extra blood glucose check to ensure patient does not bottom out.  Noted that 2000 inslulin coverage was held due to contraindications.  Increased fentanyl gtt to relax patient.  Continuing to closely monitor

## 2018-07-29 NOTE — Progress Notes (Signed)
[facility] ROUNDING NOTE   Subjective:   Interval History: Started CRRT yesterday.  I/Os +916, UOP 1L. Copious vomit last PM.  Off pressors this morning Urine culture with lactobacillus, o/w NGTD.  Objective:  Vital signs in last 24 hours:  Temp:  [98 F (36.7 C)-99.9 F (37.7 C)] 98 F (36.7 C) (08/01 0729) Pulse Rate:  [94-110] 96 (08/01 0600) Resp:  [18-26] 18 (08/01 0700) BP: (80-159)/(39-86) 159/86 (08/01 0700) SpO2:  [93 %-100 %] 100 % (08/01 0600) FiO2 (%):  [40 %] 40 % (08/01 0410) Weight:  [141.6 kg (312 lb 2.7 oz)] 141.6 kg (312 lb 2.7 oz) (08/01 0500)  Weight change: 3.5 kg (7 lb 11.5 oz) Filed Weights   07/27/18 0457 07/28/18 0447 07/29/18 0500  Weight: (!) 137.8 kg (303 lb 12.7 oz) (!) 138.1 kg (304 lb 7.3 oz) (!) 141.6 kg (312 lb 2.7 oz)    Intake/Output: I/O last 3 completed shifts: In: 5148.9 [I.V.:950.5; NG/GT:2579.1; IV Piggyback:1619.3] Out: 2970 [Urine:1155; TXMIW:8032]   Intake/Output this shift:  No intake/output data recorded.  General - ill-appearing female, sedated and intubated, does not follow commands  CVS- RRR, no mrg  RS- mechanical breath sounds anteriorly  ABD- BS present soft non-distended EXT- trace bilateral LE edema   Basic Metabolic Panel: Recent Labs  Lab 07/26/18 0402  07/27/18 0547 07/27/18 1602 07/28/18 0511 07/28/18 1623 07/29/18 0450  NA 154*  --  155*  --  147* 145 143  K 5.0  --  4.2  --  4.0 3.9 3.6  CL 122*  --  125*  --  117* 115* 110  CO2 17*  --  17*  --  17* 16* 22  GLUCOSE 527*  --  235*  --  378* 292* 171*  BUN 86*  --  98*  --  115* 105* 67*  CREATININE 8.63*  --  10.90*  --  12.54* 11.16* 7.52*  CALCIUM 7.2*  --  6.7*  --  6.5* 6.2* 7.1*  MG  --   --  2.1 2.0 2.0 1.9 2.0  PHOS  --    < > 4.8* 5.8* 4.8* 4.5  4.4 4.0   < > = values in this interval not displayed.    Liver Function Tests: Recent Labs  Lab 07/24/18 1021 07/24/18 2010 07/26/18 0402 07/28/18 1623  AST 29 33 27  --    ALT 45* 41 32  --   ALKPHOS 118 114 96  --   BILITOT 1.5* 1.5* 0.7  --   PROT 7.9 6.9 6.0*  --   ALBUMIN 3.5 3.0* 2.2* 1.5*   Recent Labs  Lab 07/24/18 2010  AMYLASE 891*   No results for input(s): AMMONIA in the last 168 hours.  CBC: Recent Labs  Lab 07/24/18 1021  07/24/18 1704 07/25/18 0251 07/26/18 0722 07/27/18 0547 07/28/18 0511  WBC 11.5*  --  7.1 8.6 8.2 7.3 5.6  NEUTROABS 9.9*  --  5.3  --   --   --   --   HGB 14.2   < > 13.2 13.1 12.5 10.6* 10.8*  HCT 54.8*   < > 47.2* 43.4 41.3 35.5* 35.0*  MCV 107.2*  --  95.0 88.4 88.2 89.0 86.2  PLT 250  --  177 178 90* 86* 73*   < > = values in this interval not displayed.    Cardiac Enzymes: Recent Labs  Lab 07/24/18 1021 07/24/18 2010 07/24/18 2345 07/25/18 0251  TROPONINI <0.03 <0.03 0.03* 0.03*  BNP: Invalid input(s): POCBNP  CBG: Recent Labs  Lab 07/28/18 1515 07/28/18 1905 07/28/18 2315 07/29/18 0343 07/29/18 0725  GLUCAP 248* 216* 195* 148* 155*    Microbiology: Results for orders placed or performed during the hospital encounter of 07/24/18  Urine culture     Status: None   Collection Time: 07/24/18 10:20 AM  Result Value Ref Range Status   Specimen Description   Final    URINE, CLEAN CATCH Performed at [facility], [address] [place], [place] [address]    Special Requests   Final    NONE Performed at [facility], [address], [place], [place] [address]    Culture   Final    NO GROWTH Performed at [facility], [place] [address], [place], [place] [address]    Report Status 07/25/2018 FINAL  Final  Culture, blood (routine x 2)     Status: None (Preliminary result)   Collection Time: 07/24/18  4:49 PM  Result Value Ref Range Status   Specimen Description BLOOD LEFT FOOT  Final   Special Requests   Final    BOTTLES DRAWN AEROBIC ONLY Blood Culture results may not be optimal due to an inadequate volume of blood received in culture bottles   Culture   Final    NO GROWTH  4 DAYS Performed at [facility], [place] [address], [place], [place] [address]    Report Status PENDING  Incomplete  Urine culture     Status: None   Collection Time: 07/24/18  6:54 PM  Result Value Ref Range Status   Specimen Description URINE, RANDOM  Final   Special Requests NONE  Final   Culture   Final    NO GROWTH Performed at [facility], [address], [place], [place] [address]    Report Status 07/25/2018 FINAL  Final  MRSA PCR Screening     Status: None   Collection Time: 07/24/18  9:03 PM  Result Value Ref Range Status   MRSA by PCR NEGATIVE NEGATIVE Final    Comment:        The GeneXpert MRSA Assay (FDA approved for NASAL specimens only), is one component of a comprehensive MRSA colonization surveillance program. It is not intended to diagnose MRSA infection nor to guide or monitor treatment for MRSA infections. Performed at [facility], [place] [address], [place], [place] [address]   Culture, blood (routine x 2)     Status: None (Preliminary result)   Collection Time: 07/25/18  5:18 AM  Result Value Ref Range Status   Specimen Description BLOOD LEFT HAND  Final   Special Requests   Final    BOTTLES DRAWN AEROBIC ONLY Blood Culture results may not be optimal due to an inadequate volume of blood received in culture bottles   Culture   Final    NO GROWTH 3 DAYS Performed at [facility], [place] [address], [place], [place] [address]    Report Status PENDING  Incomplete  Urine Culture     Status: Abnormal   Collection Time: 07/27/18  4:03 AM  Result Value Ref Range Status   Specimen Description URINE, CATHETERIZED  Final   Special Requests Normal  Final   Culture (A)  Final    >=100,000 COLONIES/mL LACTOBACILLUS SPECIES Standardized susceptibility testing for this organism is not available. Performed at [facility], [place] [address], [place], [place] [address]    Report Status 07/28/2018 FINAL  Final  Culture, blood (routine x 2)      Status: None (Preliminary result)   Collection Time: 07/27/18  5:25 AM  Result Value Ref Range Status   Specimen Description BLOOD RIGHT HAND  Final   Special Requests IN PEDIATRIC BOTTLE Blood Culture adequate volume  Final   Culture   Final    NO GROWTH 1 DAY Performed at [facility], [place] [address] [place], [place] [address]    Report Status PENDING  Incomplete  Culture, blood (routine x 2)     Status: None (Preliminary result)   Collection Time: 07/27/18  5:45 AM  Result Value Ref Range Status   Specimen Description BLOOD RIGHT HAND  Final   Special Requests   Final    BOTTLES DRAWN AEROBIC ONLY Blood Culture adequate volume   Culture   Final    NO GROWTH 1 DAY Performed at [facility], [place] [address], [place], [place] [address]    Report Status PENDING  Incomplete  Culture, respiratory (non-expectorated)     Status: None (Preliminary result)   Collection Time: 07/27/18 11:35 AM  Result Value Ref Range Status   Specimen Description TRACHEAL ASPIRATE  Final   Special Requests NONE  Final   Gram Stain   Final    ABUNDANT DEGENERATED CELLULAR MATERIAL PRESENT ABUNDANT GRAM POSITIVE COCCI IN PAIRS MODERATE GRAM VARIABLE ROD    Culture   Final    ABUNDANT STAPHYLOCOCCUS AUREUS SUSCEPTIBILITIES TO FOLLOW Performed at [facility], [place] [address] [place],  [address]    Report Status PENDING  Incomplete    Coagulation Studies: No results for input(s): LABPROT, INR in the last 72 hours.  Urinalysis: No results for input(s): COLORURINE, LABSPEC, PHURINE, GLUCOSEU, HGBUR, BILIRUBINUR, KETONESUR, PROTEINUR, UROBILINOGEN, NITRITE, LEUKOCYTESUR in the last 72 hours.  Invalid input(s): APPERANCEUR    Imaging: Dg Chest Port 1 View  Result Date: 07/28/2018 CLINICAL DATA:  Central line placement. EXAM: PORTABLE CHEST 1 VIEW COMPARISON:  07/28/2018 at 0519 hours FINDINGS: Endotracheal tube terminates near the superior margin of the clavicular heads.  Enteric tube courses into the left upper abdomen with tip not imaged. A new right jugular catheter terminates over the mid upper SVC. The cardiomediastinal silhouette is unchanged. Mild right basilar opacity has improved and may reflect atelectasis. There is persistent left basilar consolidation, likely with a small left pleural effusion. No pneumothorax is identified. IMPRESSION: 1. Right jugular catheter placement as above.  No pneumothorax. 2. Improved aeration of the right lung base. Persistent left basilar consolidation and likely small left pleural effusion. Electronically Signed   By: Logan Bores M.D.   On: 07/28/2018 11:50   Dg Chest Port 1 View  Result Date: 07/28/2018 CLINICAL DATA:  Respiratory failure. EXAM: PORTABLE CHEST 1 VIEW COMPARISON:  Radiograph of July 27, 2018. FINDINGS: Stable cardiomegaly. Endotracheal and nasogastric tubes are unchanged in position. No pneumothorax is noted. Mild bibasilar opacities are noted consistent with subsegmental atelectasis or infiltrates. Probable mild left pleural effusion is noted. Bony thorax is unremarkable. IMPRESSION: Stable support apparatus. Bibasilar subsegmental atelectasis or infiltrates are noted. Small left pleural effusion is noted. Electronically Signed   By: Marijo Conception, M.D.   On: 07/28/2018 07:14     Medications:   . sodium chloride 10 mL/hr at 07/29/18 0700  . sodium chloride 20 mL/hr at 07/29/18 0700  . feeding supplement (VITAL HIGH PROTEIN) Stopped (07/28/18 2013)  . fentaNYL infusion INTRAVENOUS 150 mcg/hr (07/29/18 0700)  .  phenylephrine (NEO-SYNEPHRINE) Adult infusion 40 mcg/min (07/29/18 0700)  . dialysis replacement fluid (prismasate) 500 mL/hr at 07/29/18 0251  . dialysis replacement fluid (prismasate) 500 mL/hr at 07/29/18 0304  . dialysate (PRISMASATE) 1,500 mL/hr at 07/29/18 0636  . sodium chloride    . vancomycin     . chlorhexidine gluconate (MEDLINE KIT)  15 mL Mouth Rinse BID  . free water  250 mL Per  Tube Q4H  . Gerhardt's butt cream   Topical BID  . heparin  5,000 Units Subcutaneous Q8H  . insulin aspart  0-20 Units Subcutaneous Q4H  . insulin aspart  7 Units Subcutaneous Q4H  . insulin detemir  20 Units Subcutaneous Q12H  . levothyroxine  125 mcg Per Tube QAC breakfast  . mouth rinse  15 mL Mouth Rinse 10 times per day  . pantoprazole sodium  40 mg Per Tube Daily   sodium chloride, Place/Maintain arterial line **AND** sodium chloride, acetaminophen (TYLENOL) oral liquid 160 mg/5 mL, dextrose, docusate, fentaNYL, heparin, ipratropium-albuterol, midazolam, ondansetron (ZOFRAN) IV, sodium chloride  Assessment/ Plan:  Patient is a47 y.o.femalewith a PMHx of uncontrolled T2DM, hypothyroidisms/p RIA due to Grave's disease, HTN,schizophrenia, and medication noncompliance x 2-3 monthswho was admitted to [facility]2019for evaluationacute metabolic encephalopathy and was found to be in DKA and acute renal failure.  1. Severe oliguric AKI: Likely secondary to severe dehydration in the setting of DKA and ATN from transient hypotension after initiation of propofol for sedation as well as possible NSAID and ACEi use at home.Could also have a component of CKD. Baseline Cr unclear but last Cr 1.2 in 06/2015.Started CRRT for volume overload and oliguria yesterday, BUN ~100.   --Continue CRRT for now but recent BPs are much improved so may be able to hold through the day.  --We are hopeful for renal recovery.   2. Hypernatremiawith hyperchloremia: Resolved. Will continue free water flushes and titrate as needed.   - Free water 400 mL q4h per OG tube  - Continue to monitor Na  3. Thrombocytopenia:New.were 30 yesterday, we are not giving heparin with HD but there is an order for heparin for catheter if she's off RRT.  Let us know if suspicion for HIT and we can modify that.   4. XKG:YJEH per CCM.  Appears resolved.  5. HTN: on several antihypertensive medications at home including  amlodipine 10 mg QD, hydralazine 58m TID, HCTZ 25 mg QD AND lisinopril-HCTZ 10-12.5 mgQd, and metoprolol 50 mg BID. Unclear if she has been taking all of these meds at home. -Holding currently in light of shock.   LOS: 5 KJustin Mend7/31/2019 7:34 AM   I personally saw and evaluated the patient.  Worsening oliguric, severe AKI now with volume overload.  Initiate CRRT given marginal BPs currently (sepsis).  Appreciate CCM placing catheter.  Please call with concerns.  LJannifer HickMD

## 2018-07-29 NOTE — Progress Notes (Signed)
PULMONARY / CRITICAL CARE MEDICINE   Name: [name] MRN: [id number] DOB: 10-11-1971    ADMISSION DATE:  07/24/2018 CONSULTATION DATE:  07/24/2018  REFERRING MD:  EDP  CHIEF COMPLAINT:  Altered mental status, Elevated BS  HISTORY OF PRESENT ILLNESS:   47 year old with history of hypertension, lupus, goiter, paranoid schizophrenia, diabetes went to [facility] ED with altered mental status.  EMS called by family as she was sick for 3 days and lying on the couch.  Patient had seizure activity with EMS and given 5 mg of Versed. Found to have hypoglycemia with blood sugars of 1556, anion gap acidosis, elevated lactic acid. Intubated for altered mental status in the ED and transferred to [facility].  As per the family she did not have any fevers, nausea, vomiting, diarrhea, pain prior to this.  They are not aware that she has diabetes but she does have metformin on her med list.  There are no prior history of seizures.  She had not been taking any of her other medications except psychiatric medications for the past 2 to 3 months.  Family has noticed that she was having excessive urination over the past few weeks.   SUBJECTIVE:  Currently on CVVH  VITAL SIGNS: BP (!) 145/75   Pulse 100   Temp 98.7 F (37.1 C) (Oral)   Resp 19   Ht 5\' 9"  (1.753 m)   Wt (!) 312 lb 2.7 oz (141.6 kg)   LMP 09/18/2012   SpO2 98%   BMI 46.10 kg/m   HEMODYNAMICS:    VENTILATOR SETTINGS: Vent Mode: PRVC FiO2 (%):  [40 %] 40 % Set Rate:  [20 bmp] 20 bmp Vt Set:  [530 mL] 530 mL PEEP:  [5 cmH20] 5 cmH20 Plateau Pressure:  [11 cmH20-27 cmH20] 15 cmH20  INTAKE / OUTPUT: I/O last 3 completed shifts: In: 5148.9 [I.V.:950.5; NG/GT:2579.1; IV Piggyback:1619.3] Out: 2970 [Urine:1155; Other:1815]  PHYSICAL EXAMINATION: General: Female currently on full mechanical dilatory support HEENT: Scleral edema noted Neuro: Arouses to voice CV: Has regular PULM: Creased breath sounds in the  bases DZ:HGDJ, non-tender, bsx4 active  Extremities: warm/dry, 3+ edema  Skin: no rashes or lesions    LABS:  BMET Recent Labs  Lab 07/28/18 0511 07/28/18 1623 07/29/18 0450  NA 147* 145 143  K 4.0 3.9 3.6  CL 117* 115* 110  CO2 17* 16* 22  BUN 115* 105* 67*  CREATININE 12.54* 11.16* 7.52*  GLUCOSE 378* 292* 171*    Electrolytes Recent Labs  Lab 07/28/18 0511 07/28/18 1623 07/29/18 0450  CALCIUM 6.5* 6.2* 7.1*  MG 2.0 1.9 2.0  PHOS 4.8* 4.5  4.4 4.0    CBC Recent Labs  Lab 07/26/18 0722 07/27/18 0547 07/28/18 0511  WBC 8.2 7.3 5.6  HGB 12.5 10.6* 10.8*  HCT 41.3 35.5* 35.0*  PLT 90* 86* 73*    Coag's Recent Labs  Lab 07/24/18 1021  INR 1.14    Sepsis Markers Recent Labs  Lab 07/25/18 0251 07/25/18 0852 07/27/18 0547 07/28/18 0511 07/28/18 0515 07/29/18 0450  LATICACIDVEN 1.5 2.1*  --   --  1.2  --   PROCALCITON  --   --  6.65 11.61  --  5.97    ABG Recent Labs  Lab 07/24/18 1200 07/25/18 0412  PHART 7.232* 7.333*  PCO2ART 34.4 32.8  PO2ART 228* 85.0    Liver Enzymes Recent Labs  Lab 07/24/18 1021 07/24/18 2010 07/26/18 0402 07/28/18 1623  AST 29 33 27  --  ALT 45* 41 32  --   ALKPHOS 118 114 96  --   BILITOT 1.5* 1.5* 0.7  --   ALBUMIN 3.5 3.0* 2.2* 1.5*    Cardiac Enzymes Recent Labs  Lab 07/24/18 2010 07/24/18 2345 07/25/18 0251  TROPONINI <0.03 0.03* 0.03*    Glucose Recent Labs  Lab 07/28/18 1515 07/28/18 1905 07/28/18 2315 07/29/18 0343 07/29/18 0725 07/29/18 1132  GLUCAP 248* 216* 195* 148* 155* 127*    Imaging Dg Chest Port 1 View  Result Date: 07/29/2018 CLINICAL DATA:  Respiratory failure. EXAM: PORTABLE CHEST 1 VIEW COMPARISON:  07/28/2018. FINDINGS: Endotracheal tube, NG tube, right IJ line stable position. Heart size stable. Bibasilar atelectasis. Small left pleural effusion, improved from prior exam. No pneumothorax. Surgical clips base of the neck. IMPRESSION: 1.  Lines and tubes in  stable position. 2.  Small left pleural effusion.  Improved from prior exam. Electronically Signed   By: Marcello Moores  Register   On: 07/29/2018 08:56    STUDIES:  Renal US 7/28 >> no acute abnormality  CULTURES: BCx2 7/27 >>  BC 7/28>> UC 7/27 >> negative Cultures from 02/2018>> greater than 100,000 colonies unidentified organism>> BC x 2 7/30>>  Sputum  7/30>> sa pending SS>> MSSA  ANTIBIOTICS: Zosyn 7/30>>7/31 Vanc 7/31 x 1 dose till cultures back>> Ancef 7/31>>  SIGNIFICANT EVENTS: 7/27  Admit  7/28 Sedation adjusted to fentanyl drip. No follow commands 7/29 nephrology consulted  LINES/TUBES: ETT 7/27 >> 07/28/2018 right IJ hemodialysis cath>>  DISCUSSION: 47 year old with history of schizophrenia, goiter diabetes admitted with DKA due to non compliance with meds & AKI.  Progressive rise in serum creatinine with borderline urinary output. New fever 7/30 am. 07/28/2018 renal function continues to worsen she will require a hemodialysis catheter placement. 1 currently day 2 of CVVH.  More awake.  Glucose well controlled.  Off pressors.  ASSESSMENT / PLAN:  PULMONARY A: Acute Respiratory Insufficiency - in the setting of acute metabolic encephalopathy  L basilar infiltrate Failed SBT 7/30 am P:   Ventilator bundle Daily wake-up assessment Mental status precludes extubation at this time  CARDIOVASCULAR A:  Hypotension, lactic acidosis -resolved + 15 L since admission P:   ICU monitoring Pressor support as needed, currently off pressor support  RENAL Lab Results  Component Value Date   CREATININE 7.52 (H) 07/29/2018   CREATININE 11.16 (H) 07/28/2018   CREATININE 12.54 (H) 07/28/2018   CREATININE 0.84 10/22/2012   Recent Labs  Lab 07/28/18 0511 07/28/18 1623 07/29/18 0450  K 4.0 3.9 3.6   Recent Labs  Lab 07/28/18 0511 07/28/18 1623 07/29/18 0450  NA 147* 145 143  ' A:   AKI - rising sr cr, borderline oliguric Hyperkalemia resolved AGMA - in setting  of renal failure, DKA [place] no acute abnormalities + 15 L on 7/30 P:   Day 2 of CVVH Nephrology is following  GASTROINTESTINAL A:   Morbid Obesity  P:   PPI for SUP  Dietitian consult for TF 7/30  Cortrak  Placement ordered   HEMATOLOGIC Recent Labs    07/27/18 0547 07/28/18 0511  HGB 10.6* 10.8*    A:   Elevated hematocrit secondary to dehydration>> resolved Anemia P:  Trend CBC  INFECTIOUS A:   Temp spike to 101.8 on 7/30 early am Pro calcitonin bump 7/30 to 6.65 P:   Continue antimicrobial therapy, one dose of vanc 7/31 sa in sputum which is methicillin sensitive staph aureus therefore no further vancomycin Temperature curve follow Currently on Ancef  ENDOCRINE CBG (last 3)  Recent Labs    07/29/18 0343 07/29/18 0725 07/29/18 1132  GLUCAP 148* 155* 127*    A:   DKA Goiter, exophthalmos Hypothyroidism - TSH 6.379 on admit P:   DKA protocol Goes well controlled at 127 Synthroid   NEUROLOGIC A:   Acute Metabolic Encephalopathy - AKI, DKA, ? Post ictal Seizure- reported on admit, no hx of  CT head is unremarkable. P:   Remains on full mechanical ventilatory support Intermittently follows commands   FAMILY  - Updates: 07/28/2018 no family at bedside.  - Inter-disciplinary family meet or Palliative Care meeting due by:  8/5  CC Time: 30 minutes   Richardson Landry Salvatrice Morandi ACNP Maryanna Shape PCCM Pager [phone] till 1 pm If no answer page [phone] 07/29/2018, 12:09 PM

## 2018-07-29 NOTE — Procedures (Signed)
Patient evaluated on CRRT and changes were made as appropriate.   Electrolytes evaluated.   Jannifer Hick MD

## 2018-07-29 NOTE — Progress Notes (Signed)
Nutrition Follow-up  DOCUMENTATION CODES:   Obesity unspecified  INTERVENTION:   If abdominal xray clear, recommend resuming Vital High Protein @ 20 ml/hr  If pt does not tolerate gastric feedings and abdominal x-ray clear, recommend insertion of post-pyloric Cortrak tube  With hx of poorly controlled DM and current acute illness, pt at high risk for delayed gastric emptying. Pt may benefit from addition of pro-motility agent such as reglan or erthyromycin   Pt may also benefit from adjustment of bowel regimen if constipation continues  NUTRITION DIAGNOSIS:   Inadequate oral intake related to inability to eat as evidenced by NPO status.  Continues but being addressed  GOAL:   Provide needs based on ASPEN/SCCM guidelines  Not Met  MONITOR:   I & O's, Weight trends  REASON FOR ASSESSMENT:   Ventilator    ASSESSMENT:   Patient with PMH HTN, lupus, goiter, paranoid schizophrenia, diabetes presented with AMS. Family states she was sick for 3 days and just lying on the couch. Blood sugar upon presentation was 1556, admitted for DKA, AKI.  7/31 CRRT initiated for volume overload and oliguria in AKI  Pt remains on vent support, off pressors, off insulin drip  Hypernatremia improved, free water decreased to 250 mL q 4 hours yesterday Vital High Protein @ 65 ml/hr; per report, pt with vomiting x 2 last night post administration of free water blolus Elink notified and recommended holding TF for 2 hours and then resuming with plan for KUB if vomiting continues. Zofran also ordered +Constipation, no BM since admission. Abdomen soft, obese. BS present Discussed with RN on rounds this AM, TF as not been resumed since episode of vomiting.   Discussed nutrition poc with MD McQuaid. MD plans to obtain abdominal xray. If negative, plan to resume trickle TF today. If pt does not tolerate trickle TF, plan to place post-pyloric Cortrak tube tomorrow when service available  Labs: CBGs  127-276 Meds: ss novolog, levemir, novloog q 4 hours   Diet Order:   Diet Order           Diet NPO time specified  Diet effective now          EDUCATION NEEDS:   Not appropriate for education at this time  Skin:  Skin Assessment: Reviewed RN Assessment(MSAD to L breast) Skin Integrity Issues:: Other (Comment) Other: MASD: breast, buttock;  Last BM:  no documented BM  Height:   Ht Readings from Last 1 Encounters:  07/24/18 _0  (1.753 m)    Weight:   Wt Readings from Last 1 Encounters:  07/29/18 (!) 312 lb 2.7 oz (141.6 kg)    Ideal Body Weight:  65.9 kg  BMI:  Body mass index is 46.1 kg/m.  Estimated Nutritional Needs:   Kcal:  1371-1746 kcal  Protein:  >/= 132 grams  Fluid:  >/=1.3 L/day   Kerman Passey MS, RD, LDN, CNSC [phone] Pager  [phone] Weekend/On-Call Pager

## 2018-07-30 ENCOUNTER — Inpatient Hospital Stay (HOSPITAL_COMMUNITY): Payer: BLUE CROSS/BLUE SHIELD

## 2018-07-30 DIAGNOSIS — E0911 Drug or chemical induced diabetes mellitus with ketoacidosis with coma: Secondary | ICD-10-CM

## 2018-07-30 LAB — GLUCOSE, CAPILLARY
GLUCOSE-CAPILLARY: 107 mg/dL — AB (ref 70–99)
GLUCOSE-CAPILLARY: 130 mg/dL — AB (ref 70–99)
GLUCOSE-CAPILLARY: 140 mg/dL — AB (ref 70–99)
GLUCOSE-CAPILLARY: 161 mg/dL — AB (ref 70–99)
Glucose-Capillary: 121 mg/dL — ABNORMAL HIGH (ref 70–99)
Glucose-Capillary: 88 mg/dL (ref 70–99)

## 2018-07-30 LAB — RENAL FUNCTION PANEL
ALBUMIN: 1.3 g/dL — AB (ref 3.5–5.0)
Anion gap: 8 (ref 5–15)
BUN: 26 mg/dL — AB (ref 6–20)
CALCIUM: 7.5 mg/dL — AB (ref 8.9–10.3)
CO2: 26 mmol/L (ref 22–32)
CREATININE: 3.59 mg/dL — AB (ref 0.44–1.00)
Chloride: 107 mmol/L (ref 98–111)
GFR calc Af Amer: 16 mL/min — ABNORMAL LOW (ref >60)
GFR, EST NON AFRICAN AMERICAN: 14 mL/min — AB (ref >60)
GLUCOSE: 97 mg/dL (ref 70–99)
PHOSPHORUS: 3.1 mg/dL (ref 2.5–4.6)
Potassium: 3.7 mmol/L (ref 3.5–5.1)
Sodium: 141 mmol/L (ref 135–145)

## 2018-07-30 LAB — BASIC METABOLIC PANEL
Anion gap: 13 (ref 5–15)
BUN: 33 mg/dL — AB (ref 6–20)
CO2: 24 mmol/L (ref 22–32)
CREATININE: 4.23 mg/dL — AB (ref 0.44–1.00)
Calcium: 7.8 mg/dL — ABNORMAL LOW (ref 8.9–10.3)
Chloride: 104 mmol/L (ref 98–111)
GFR calc Af Amer: 13 mL/min — ABNORMAL LOW (ref >60)
GFR, EST NON AFRICAN AMERICAN: 11 mL/min — AB (ref >60)
Glucose, Bld: 146 mg/dL — ABNORMAL HIGH (ref 70–99)
Potassium: 3.7 mmol/L (ref 3.5–5.1)
Sodium: 141 mmol/L (ref 135–145)

## 2018-07-30 LAB — MAGNESIUM: Magnesium: 2.4 mg/dL (ref 1.7–2.4)

## 2018-07-30 LAB — CULTURE, RESPIRATORY

## 2018-07-30 LAB — CULTURE, RESPIRATORY W GRAM STAIN

## 2018-07-30 LAB — CULTURE, BLOOD (ROUTINE X 2): CULTURE: NO GROWTH

## 2018-07-30 LAB — PHOSPHORUS: Phosphorus: 3.7 mg/dL (ref 2.5–4.6)

## 2018-07-30 MED ORDER — FENTANYL CITRATE (PF) 100 MCG/2ML IJ SOLN
100.0000 ug | INTRAMUSCULAR | Status: AC | PRN
  Administered 2018-07-30 – 2018-07-31 (×3): 100 ug via INTRAVENOUS
  Filled 2018-07-30 (×3): qty 2

## 2018-07-30 MED ORDER — FENTANYL CITRATE (PF) 100 MCG/2ML IJ SOLN: 100.0000 ug | INTRAMUSCULAR | Status: DC | PRN

## 2018-07-30 MED ORDER — DEXMEDETOMIDINE HCL IN NACL 400 MCG/100ML IV SOLN
0.0000 ug/kg/h | INTRAVENOUS | Status: DC
  Administered 2018-07-30: 1.2 ug/kg/h via INTRAVENOUS
  Administered 2018-07-30: 0.8 ug/kg/h via INTRAVENOUS
  Administered 2018-07-31: 0.4 ug/kg/h via INTRAVENOUS
  Filled 2018-07-30 (×7): qty 100

## 2018-07-30 MED ORDER — MIDAZOLAM HCL 2 MG/2ML IJ SOLN
2.0000 mg | INTRAMUSCULAR | Status: DC | PRN
  Administered 2018-07-30: 2 mg via INTRAVENOUS
  Filled 2018-07-30: qty 2

## 2018-07-30 MED ORDER — MIDAZOLAM HCL 2 MG/2ML IJ SOLN
2.0000 mg | INTRAMUSCULAR | Status: AC | PRN
  Administered 2018-07-30 – 2018-07-31 (×3): 2 mg via INTRAVENOUS
  Filled 2018-07-30 (×3): qty 2

## 2018-07-30 NOTE — Progress Notes (Signed)
[facility] ROUNDING NOTE   Subjective:   Interval History: CRRT yesterday. I/Os 2.2/3.8 (net neg 1.6 in 24h); UOP 69m yesterday.  CCM hoping to extubate today.  Still on low dose phenylephrine.  Per RN filter pressuring going up, UOP down a lot overnight and into the AM.   Objective:  Vital signs in last 24 hours:  Temp:  [97.7 F (36.5 C)-98.9 F (37.2 C)] 98.1 F (36.7 C) (08/02 0725) Pulse Rate:  [84-109] 96 (08/02 0743) Resp:  [12-27] 14 (08/02 0900) BP: (84-145)/(43-93) 130/83 (08/02 0800) SpO2:  [93 %-100 %] 93 % (08/02 0743) FiO2 (%):  [40 %] 40 % (08/02 0744) Weight:  [140 kg (308 lb 10.3 oz)] 140 kg (308 lb 10.3 oz) (08/02 0500)  Weight change: -1.6 kg (-3 lb 8.4 oz) Filed Weights   07/28/18 0447 07/29/18 0500 07/30/18 0500  Weight: (!) 138.1 kg (304 lb 7.3 oz) (!) 141.6 kg (312 lb 2.7 oz) (!) 140 kg (308 lb 10.3 oz)    Intake/Output: I/O last 3 completed shifts: In: 3235.8 [I.V.:2375; Other:60; NSJ/GG:836.6 IV Piggyback:333.4] Out: 52947 [MLYYT:0354 OSFKCL:2751]  Intake/Output this shift:  Total I/O In: 310.4 [I.V.:310.4] Out: -   General - ill-appearing female, sedated and intubated, does not follow commands  CVS- RRR, no mrg  RS- mechanical breath sounds anteriorly  ABD- BS present soft non-distended EXT- trace bilateral LE edema   Basic Metabolic Panel: Recent Labs  Lab 07/27/18 1602 07/28/18 0511 07/28/18 1623 07/29/18 0450 07/29/18 1646 07/30/18 0600  NA  --  147* 145 143 141 141  K  --  4.0 3.9 3.6 3.8 3.7  CL  --  117* 115* 110 107 104  CO2  --  17* 16* 22 23 24   GLUCOSE  --  378* 292* 171* 91 146*  BUN  --  115* 105* 67* 37* 33*  CREATININE  --  12.54* 11.16* 7.52* 4.20* 4.23*  CALCIUM  --  6.5* 6.2* 7.1* 7.3* 7.8*  MG 2.0 2.0 1.9 2.0  --  2.4  PHOS 5.8* 4.8* 4.5  4.4 4.0 2.6 3.7    Liver Function Tests: Recent Labs  Lab 07/24/18 1021 07/24/18 2010 07/26/18 0402 07/28/18 1623 07/29/18 1646  AST 29 33 27  --    --   ALT 45* 41 32  --   --   ALKPHOS 118 114 96  --   --   BILITOT 1.5* 1.5* 0.7  --   --   PROT 7.9 6.9 6.0*  --   --   ALBUMIN 3.5 3.0* 2.2* 1.5* 1.6*   Recent Labs  Lab 07/24/18 2010  AMYLASE 891*   No results for input(s): AMMONIA in the last 168 hours.  CBC: Recent Labs  Lab 07/24/18 1021  07/24/18 1704 07/25/18 0251 07/26/18 0722 07/27/18 0547 07/28/18 0511  WBC 11.5*  --  7.1 8.6 8.2 7.3 5.6  NEUTROABS 9.9*  --  5.3  --   --   --   --   HGB 14.2   < > 13.2 13.1 12.5 10.6* 10.8*  HCT 54.8*   < > 47.2* 43.4 41.3 35.5* 35.0*  MCV 107.2*  --  95.0 88.4 88.2 89.0 86.2  PLT 250  --  177 178 90* 86* 73*   < > = values in this interval not displayed.    Cardiac Enzymes: Recent Labs  Lab 07/24/18 1021 07/24/18 2010 07/24/18 2345 07/25/18 0251  TROPONINI <0.03 <0.03 0.03* 0.03*    BNP:  Invalid input(s): POCBNP  CBG: Recent Labs  Lab 07/29/18 1956 07/29/18 2210 07/29/18 2341 07/30/18 0342 07/30/18 0734  GLUCAP 85 100* 116* 140* 121*    Microbiology: Results for orders placed or performed during the hospital encounter of 07/24/18  Urine culture     Status: None   Collection Time: 07/24/18 10:20 AM  Result Value Ref Range Status   Specimen Description   Final    URINE, CLEAN CATCH Performed at [facility], [address], [place], [place] [address]    Special Requests   Final    NONE Performed at [facility], [address] [place], [place] [address]    Culture   Final    NO GROWTH Performed at [facility], [place] [address], [place], [place] [address]    Report Status 07/25/2018 FINAL  Final  Culture, blood (routine x 2)     Status: None   Collection Time: 07/24/18  4:49 PM  Result Value Ref Range Status   Specimen Description BLOOD LEFT FOOT  Final   Special Requests   Final    BOTTLES DRAWN AEROBIC ONLY Blood Culture results may not be optimal due to an inadequate volume of blood received in culture bottles   Culture   Final    NO  GROWTH 5 DAYS Performed at [facility], [place] [address] [place], [place] [address]    Report Status 07/29/2018 FINAL  Final  Urine culture     Status: None   Collection Time: 07/24/18  6:54 PM  Result Value Ref Range Status   Specimen Description URINE, RANDOM  Final   Special Requests NONE  Final   Culture   Final    NO GROWTH Performed at [facility], [place] [address] [place], [place] [address]    Report Status 07/25/2018 FINAL  Final  MRSA PCR Screening     Status: None   Collection Time: 07/24/18  9:03 PM  Result Value Ref Range Status   MRSA by PCR NEGATIVE NEGATIVE Final    Comment:        The GeneXpert MRSA Assay (FDA approved for NASAL specimens only), is one component of a comprehensive MRSA colonization surveillance program. It is not intended to diagnose MRSA infection nor to guide or monitor treatment for MRSA infections. Performed at [facility], [place] [address] [place], [place] [address]   Culture, blood (routine x 2)     Status: None (Preliminary result)   Collection Time: 07/25/18  5:18 AM  Result Value Ref Range Status   Specimen Description BLOOD LEFT HAND  Final   Special Requests   Final    BOTTLES DRAWN AEROBIC ONLY Blood Culture results may not be optimal due to an inadequate volume of blood received in culture bottles   Culture   Final    NO GROWTH 4 DAYS Performed at [facility], [place] [address], [place], [place] [address]    Report Status PENDING  Incomplete  Urine Culture     Status: Abnormal   Collection Time: 07/27/18  4:03 AM  Result Value Ref Range Status   Specimen Description URINE, CATHETERIZED  Final   Special Requests Normal  Final   Culture (A)  Final    >=100,000 COLONIES/mL LACTOBACILLUS SPECIES Standardized susceptibility testing for this organism is not available. Performed at [facility], [place] [address], [place], [place] [address]    Report Status 07/28/2018 FINAL  Final  Culture,  blood  (routine x 2)     Status: None (Preliminary result)   Collection Time: 07/27/18  5:25 AM  Result Value Ref Range Status   Specimen Description BLOOD RIGHT HAND  Final   Special Requests IN PEDIATRIC BOTTLE Blood Culture adequate volume  Final   Culture   Final    NO GROWTH 2 DAYS Performed at [facility], [place] [address], [place], [place] [address]    Report Status PENDING  Incomplete  Culture, blood (routine x 2)     Status: None (Preliminary result)   Collection Time: 07/27/18  5:45 AM  Result Value Ref Range Status   Specimen Description BLOOD RIGHT HAND  Final   Special Requests   Final    BOTTLES DRAWN AEROBIC ONLY Blood Culture adequate volume   Culture   Final    NO GROWTH 2 DAYS Performed at [facility], [place] [address], [place], [place] [address]    Report Status PENDING  Incomplete  Culture, respiratory (non-expectorated)     Status: None (Preliminary result)   Collection Time: 07/27/18 11:35 AM  Result Value Ref Range Status   Specimen Description TRACHEAL ASPIRATE  Final   Special Requests NONE  Final   Gram Stain   Final    ABUNDANT DEGENERATED CELLULAR MATERIAL PRESENT ABUNDANT GRAM POSITIVE COCCI IN PAIRS MODERATE GRAM VARIABLE ROD Performed at [facility], [place] [address], [place], [place] [address]    Culture ABUNDANT STAPHYLOCOCCUS AUREUS  Final   Report Status PENDING  Incomplete   Organism ID, Bacteria STAPHYLOCOCCUS AUREUS  Final      Susceptibility   Staphylococcus aureus - MIC*    CIPROFLOXACIN 1 SENSITIVE Sensitive     ERYTHROMYCIN >=8 RESISTANT Resistant     GENTAMICIN <=0.5 SENSITIVE Sensitive     OXACILLIN 0.5 SENSITIVE Sensitive     TETRACYCLINE <=1 SENSITIVE Sensitive     VANCOMYCIN 1 SENSITIVE Sensitive     TRIMETH/SULFA <=10 SENSITIVE Sensitive     CLINDAMYCIN RESISTANT Resistant     RIFAMPIN <=0.5 SENSITIVE Sensitive     Inducible Clindamycin POSITIVE Resistant     * ABUNDANT STAPHYLOCOCCUS AUREUS    Coagulation  Studies: No results for input(s): LABPROT, INR in the last 72 hours.  Urinalysis: No results for input(s): COLORURINE, LABSPEC, PHURINE, GLUCOSEU, HGBUR, BILIRUBINUR, KETONESUR, PROTEINUR, UROBILINOGEN, NITRITE, LEUKOCYTESUR in the last 72 hours.  Invalid input(s): APPERANCEUR    Imaging: Dg Abd 1 View  Result Date: 07/30/2018 CLINICAL DATA:  Orogastric tube placement. EXAM: ABDOMEN - 1 VIEW COMPARISON:  Radiographs of July 29, 2018. FINDINGS: The bowel gas pattern is normal. Distal tip of orogastric tube is seen in distal stomach. No radio-opaque calculi or other significant radiographic abnormality are seen. IMPRESSION: Distal tip of orogastric tube seen in distal stomach. No evidence of bowel obstruction or ileus. Electronically Signed   By: Marijo Conception, M.D.   On: 07/30/2018 08:59   Dg Chest Port 1 View  Result Date: 07/30/2018 CLINICAL DATA:  Follow-up respiratory failure. EXAM: PORTABLE CHEST 1 VIEW COMPARISON:  07/29/2018 FINDINGS: Endotracheal tube tip is 3 cm above the carina. Nasogastric tube extends only to the distal esophagus. Right internal jugular central line is unchanged with its tips in the SVC. Right lung is clear. Mild left lower lobe volume loss is slightly worsened. IMPRESSION: Mild worsening of left lower lobe volume loss. Nasogastric tube tip only in the distal esophagus. These results will be called to the ordering clinician  or representative by the Radiologist Assistant, and communication documented in the PACS or zVision Dashboard. Electronically Signed   By: Nelson Chimes M.D.   On: 07/30/2018 07:27   Dg Chest Port 1 View  Result Date: 07/29/2018 CLINICAL DATA:  Respiratory failure. EXAM: PORTABLE CHEST 1 VIEW COMPARISON:  07/28/2018. FINDINGS: Endotracheal tube, NG tube, right IJ line stable position. Heart size stable. Bibasilar atelectasis. Small left pleural effusion, improved from prior exam. No pneumothorax. Surgical clips base of the neck. IMPRESSION: 1.  Lines  and tubes in stable position. 2.  Small left pleural effusion.  Improved from prior exam. Electronically Signed   By: Marcello Moores  Register   On: 07/29/2018 08:56   Dg Chest Port 1 View  Result Date: 07/28/2018 CLINICAL DATA:  Central line placement. EXAM: PORTABLE CHEST 1 VIEW COMPARISON:  07/28/2018 at 0519 hours FINDINGS: Endotracheal tube terminates near the superior margin of the clavicular heads. Enteric tube courses into the left upper abdomen with tip not imaged. A new right jugular catheter terminates over the mid upper SVC. The cardiomediastinal silhouette is unchanged. Mild right basilar opacity has improved and may reflect atelectasis. There is persistent left basilar consolidation, likely with a small left pleural effusion. No pneumothorax is identified. IMPRESSION: 1. Right jugular catheter placement as above.  No pneumothorax. 2. Improved aeration of the right lung base. Persistent left basilar consolidation and likely small left pleural effusion. Electronically Signed   By: Logan Bores M.D.   On: 07/28/2018 11:50   Dg Abd Portable 1v  Result Date: 07/29/2018 CLINICAL DATA:  Vomiting EXAM: PORTABLE ABDOMEN - 1 VIEW COMPARISON:  None. FINDINGS: Scattered large and small bowel gas is noted. Nasogastric catheter is noted within the stomach. No obstructive changes are noted. No bony abnormality is seen. IMPRESSION: No acute abnormality noted. Electronically Signed   By: Inez Catalina M.D.   On: 07/29/2018 12:21     Medications:   . sodium chloride 10 mL/hr at 07/30/18 0900  . sodium chloride 20 mL/hr at 07/30/18 0900  .  ceFAZolin (ANCEF) IV 2 g (07/30/18 0953)  . feeding supplement (VITAL HIGH PROTEIN) Stopped (07/29/18 2115)  . fentaNYL infusion INTRAVENOUS 150 mcg/hr (07/30/18 0900)  . phenylephrine (NEO-SYNEPHRINE) Adult infusion 30 mcg/min (07/30/18 0950)  . dialysis replacement fluid (prismasate) 500 mL/hr at 07/30/18 0003  . dialysis replacement fluid (prismasate) 500 mL/hr at  07/30/18 0109  . dialysate (PRISMASATE) 1,500 mL/hr at 07/30/18 0951  . sodium chloride 999 mL/hr at 07/30/18 0200   . chlorhexidine gluconate (MEDLINE KIT)  15 mL Mouth Rinse BID  . free water  250 mL Per Tube Q4H  . Gerhardt's butt cream   Topical BID  . heparin  5,000 Units Subcutaneous Q8H  . insulin aspart  0-20 Units Subcutaneous Q4H  . insulin aspart  7 Units Subcutaneous Q4H  . insulin detemir  20 Units Subcutaneous Q12H  . levothyroxine  125 mcg Per Tube QAC breakfast  . mouth rinse  15 mL Mouth Rinse 10 times per day  . metoCLOPramide (REGLAN) injection  5 mg Intravenous Q6H  . pantoprazole sodium  40 mg Per Tube Daily   sodium chloride, Place/Maintain arterial line **AND** sodium chloride, acetaminophen (TYLENOL) oral liquid 160 mg/5 mL, dextrose, docusate, fentaNYL, heparin, ipratropium-albuterol, midazolam, ondansetron (ZOFRAN) IV, sodium chloride  Assessment/ Plan:  Patient is a47 y.o.femalewith a PMHx of uncontrolled T2DM, hypothyroidisms/p RIA due to Grave's disease, HTN,schizophrenia, and medication noncompliance x 2-3 monthswho was admitted to [facility]2019for evaluationacute metabolic  encephalopathy and was found to be in DKA and acute renal failure.  1. Severe oliguric AKI: Likely secondary to severe dehydration in the setting of DKA and ATN from transient hypotension after initiation of propofol for sedation as well as possible NSAID and ACEi use at home.Could also have a component of CKD. Baseline Cr unclear but last Cr 1.2 in 06/2015.Started CRRT for volume overload and oliguria 7/31, BUN ~100.   --UOP fair in past 24 hours but has really tailed off overnight and this AM ? Hypovolemia.   --Cont CRRT but decrease FRR to net even  --RN to check CVP and let me know what it's reading   2. Hypernatremiawith hyperchloremia: Resolved. Will continue free water flushes and titrate as needed.   - Free water 400 mL q4h per OG tube  - Continue to monitor  Na  3. Thrombocytopenia:New.were 73 7/31, we are not giving heparin with HD but there is an order for heparin for catheter if she's off RRT.  Let us know if suspicion for HIT and we can modify that.   4. VAN:VBTY per CCM.  Appears resolved.  5. HTN: on several antihypertensive medications at home including amlodipine 10 mg QD, hydralazine 51m TID, HCTZ 25 mg QD AND lisinopril-HCTZ 10-12.5 mgQd, and metoprolol 50 mg BID. Unclear if she has been taking all of these meds at home. -Holding currently in light of shock.   LOS: 6 KJustin Mend7/31/2019 10:06 AM

## 2018-07-30 NOTE — Progress Notes (Signed)
Wasted 50 mL of fentanyl down sink down with Investment banker, operational.

## 2018-07-30 NOTE — Progress Notes (Signed)
Cortrak Tube Team Note:  Consult received to place a Cortrak feeding tube.   A 10 F Cortrak tube was placed in the LEFT nare and secured with a nasal bridle at 87 cm. Per the Cortrak monitor reading the tube tip is post-pyloric, D-3.   X-ray is required, abdominal x-ray has been ordered by the Cortrak team. Please confirm tube placement before using the Cortrak tube.   If the tube becomes dislodged please keep the tube and contact the Cortrak team at [email or web address] (password [email or web address]) for replacement.  If after hours and replacement cannot be delayed, place a NG tube and confirm placement with an abdominal x-ray.    Gaynell Face, MS, RD, LDN Pager: [phone] Weekend/After Hours: [phone]

## 2018-07-30 NOTE — Progress Notes (Signed)
Cortrak Tube Team Note:  RD reviewed x-ray from previous Cortrak placement earlier today. X-ray shows that "NG tube extends into the gastric antrum. Interval placement of feeding tube which extends into the stomach, loops in the gastric antrum, and tip positioned in the gastric body."  Post-pyloric placement is required. RD returned to attempt to reposition tube and obtain post-pyloric placement.  A 10 F Cortrak tube remains in the LEFT nare and secured with a nasal bridle at 85 cm. Per the Cortrak monitor reading the tube tip is post-pyloric.   X-ray is required, abdominal x-ray has been ordered by the Cortrak team. Please confirm tube placement before using the Cortrak tube.  If the tube becomes dislodged please keep the tube and contact the Cortrak team at [email or web address] (password [email or web address]) for replacement.  If after hours and replacement cannot be delayed, place a NG tube and confirm placement with an abdominal x-ray.    Gaynell Face, MS, RD, LDN Pager: [phone] Weekend/After Hours: [phone]

## 2018-07-30 NOTE — Progress Notes (Signed)
Nutrition Follow-up  DOCUMENTATION CODES:   Obesity unspecified  INTERVENTION:   Tube Feeding:  Resume trickle TF post Cortrak tube placement Vital High Protein @ 20 ml/hr Goal rate: Vital High Protein @ 65 ml/hr Provides 1560 kcals, 137 g of protein and 1310 mL of free water Meets 100% estimated calorie and protein needs  Continues without BM; consider modifying bowel regimen  NUTRITION DIAGNOSIS:   Inadequate oral intake related to inability to eat as evidenced by NPO status.  Continues but being addressed via TF   GOAL:   Provide needs based on ASPEN/SCCM guidelines  Not Met but progressing  MONITOR:   I & O's, Weight trends  REASON FOR ASSESSMENT:   Ventilator    ASSESSMENT:   Patient with PMH HTN, lupus, goiter, paranoid schizophrenia, diabetes presented with AMS. Family states she was sick for 3 days and just lying on the couch. Blood sugar upon presentation was 1556, admitted for DKA, AKI.  Pt remains on vent support CRRT continues  Vital High Protein restarted at 20 ml/hr yesterday post abdominal xray. Tube feeds started at 2000 and pt began vomiting 2115. Noted Elink MD started reglan Free water flushes of 250 mL q 4 hours  Recommend post-pyloric tube placement today with re-initiation of trickle TF post placement  BS present, abdomen soft/obese. Constipation continues, no BM. Abdominal xray on 8/2 negative for obstruction or ileus  Labs: reviewed Meds: ss novolog, levemir, reglan  Diet Order:   Diet Order           Diet NPO time specified  Diet effective now          EDUCATION NEEDS:   Not appropriate for education at this time  Skin:  Skin Assessment: Reviewed RN Assessment(MSAD to L breast) Skin Integrity Issues:: Other (Comment) Other: MASD: breast, buttock;  Last BM:  no documented BM  Height:   Ht Readings from Last 1 Encounters:  07/24/18 _0  (1.753 m)    Weight:   Wt Readings from Last 1 Encounters:  07/30/18 (!) 308  lb 10.3 oz (140 kg)    Ideal Body Weight:  65.9 kg  BMI:  Body mass index is 45.58 kg/m.  Estimated Nutritional Needs:   Kcal:  1371-1746 kcal  Protein:  >/= 132 grams  Fluid:  >/=1.3 L/day   Kerman Passey MS, RD, LDN, CNSC [phone] Pager  [phone] Weekend/On-Call Pager

## 2018-07-30 NOTE — Progress Notes (Signed)
[facility] P[facility]M  Given lack of spontaneous breathing this morning will change sedation to precedex from fentanyl infusion  Roselie Awkward, MD [place] [facility] Pager: [phone] Cell: [phone] After 3pm or if no response, call [phone]

## 2018-07-30 NOTE — Procedures (Signed)
Patient evaluated on CRRT and changes were made as appropriate.   Electrolytes evaluated. All OK.  Jannifer Hick MD

## 2018-07-30 NOTE — Progress Notes (Signed)
PULMONARY / CRITICAL CARE MEDICINE   Name: [name] MRN: [id number] DOB: [birth year]    ADMISSION DATE:  07/24/2018 CONSULTATION DATE:  07/24/2018  REFERRING MD:  EDP  CHIEF COMPLAINT:  Altered mental status, Elevated BS  HISTORY OF PRESENT ILLNESS:   47 year old with history of hypertension, lupus, goiter, paranoid schizophrenia, diabetes went to [facility] ED with altered mental status.  EMS called by family as she was sick for 3 days and lying on the couch.  Patient had seizure activity with EMS and given 5 mg of Versed. Found to have hypoglycemia with blood sugars of 1556, anion gap acidosis, elevated lactic acid. Intubated for altered mental status in the ED and transferred to [facility].  As per the family she did not have any fevers, nausea, vomiting, diarrhea, pain prior to this.  They are not aware that she has diabetes but she does have metformin on her med list.  There are no prior history of seizures.  She had not been taking any of her other medications except psychiatric medications for the past 2 to 3 months.  Family has noticed that she was having excessive urination over the past few weeks.   SUBJECTIVE:  Continues with CVVH reported to be awake at times.  VITAL SIGNS: BP 107/69   Pulse 88   Temp 98.1 F (36.7 C) (Oral)   Resp (!) 21   Ht 5\' 9"  (1.753 m)   Wt (!) 308 lb 10.3 oz (140 kg)   LMP 09/18/2012   SpO2 97%   BMI 45.58 kg/m   HEMODYNAMICS:    VENTILATOR SETTINGS: Vent Mode: CPAP;PSV FiO2 (%):  [40 %] 40 % Set Rate:  [20 bmp] 20 bmp Vt Set:  [530 mL] 530 mL PEEP:  [5 cmH20] 5 cmH20 Pressure Support:  [5 cmH20] 5 cmH20 Plateau Pressure:  [13 cmH20-16 cmH20] 16 cmH20  INTAKE / OUTPUT: I/O last 3 completed shifts: In: 3235.8 [I.V.:2375; Other:60; TI/WP:809.9; IV Piggyback:333.4] Out: 8338 [SNKNL:9767; HALPF:7902]  PHYSICAL EXAMINATION: General: Morbidly obese female sedated on vent HEENT: Tracheal tube to ventilator Neuro:  Currently awake and follows commands CV: Sounds are regular PULM: even/non-labored, lungs bilaterally diminished IO:XBDZ, non-tender, bsx4 active  Extremities: warm/dry, 2+ edema  Skin: no rashes or lesions     LABS:  BMET Recent Labs  Lab 07/29/18 0450 07/29/18 1646 07/30/18 0600  NA 143 141 141  K 3.6 3.8 3.7  CL 110 107 104  CO2 22 23 24   BUN 67* 37* 33*  CREATININE 7.52* 4.20* 4.23*  GLUCOSE 171* 91 146*    Electrolytes Recent Labs  Lab 07/28/18 1623 07/29/18 0450 07/29/18 1646 07/30/18 0600  CALCIUM 6.2* 7.1* 7.3* 7.8*  MG 1.9 2.0  --  2.4  PHOS 4.5  4.4 4.0 2.6 3.7    CBC Recent Labs  Lab 07/26/18 0722 07/27/18 0547 07/28/18 0511  WBC 8.2 7.3 5.6  HGB 12.5 10.6* 10.8*  HCT 41.3 35.5* 35.0*  PLT 90* 86* 73*    Coag's Recent Labs  Lab 07/24/18 1021  INR 1.14    Sepsis Markers Recent Labs  Lab 07/25/18 0251 07/25/18 0852 07/27/18 0547 07/28/18 0511 07/28/18 0515 07/29/18 0450  LATICACIDVEN 1.5 2.1*  --   --  1.2  --   PROCALCITON  --   --  6.65 11.61  --  5.97    ABG Recent Labs  Lab 07/24/18 1200 07/25/18 0412  PHART 7.232* 7.333*  PCO2ART 34.4 32.8  PO2ART 228* 85.0  Liver Enzymes Recent Labs  Lab 07/24/18 1021 07/24/18 2010 07/26/18 0402 07/28/18 1623 07/29/18 1646  AST 29 33 27  --   --   ALT 45* 41 32  --   --   ALKPHOS 118 114 96  --   --   BILITOT 1.5* 1.5* 0.7  --   --   ALBUMIN 3.5 3.0* 2.2* 1.5* 1.6*    Cardiac Enzymes Recent Labs  Lab 07/24/18 2010 07/24/18 2345 07/25/18 0251  TROPONINI <0.03 0.03* 0.03*    Glucose Recent Labs  Lab 07/29/18 1533 07/29/18 1956 07/29/18 2210 07/29/18 2341 07/30/18 0342 07/30/18 0734  GLUCAP 103* 85 100* 116* 140* 121*    Imaging Dg Abd 1 View  Result Date: 07/30/2018 CLINICAL DATA:  Orogastric tube placement. EXAM: ABDOMEN - 1 VIEW COMPARISON:  Radiographs of July 29, 2018. FINDINGS: The bowel gas pattern is normal. Distal tip of orogastric tube is  seen in distal stomach. No radio-opaque calculi or other significant radiographic abnormality are seen. IMPRESSION: Distal tip of orogastric tube seen in distal stomach. No evidence of bowel obstruction or ileus. Electronically Signed   By: Marijo Conception, M.D.   On: 07/30/2018 08:59   Dg Chest Port 1 View  Result Date: 07/30/2018 CLINICAL DATA:  Follow-up respiratory failure. EXAM: PORTABLE CHEST 1 VIEW COMPARISON:  07/29/2018 FINDINGS: Endotracheal tube tip is 3 cm above the carina. Nasogastric tube extends only to the distal esophagus. Right internal jugular central line is unchanged with its tips in the SVC. Right lung is clear. Mild left lower lobe volume loss is slightly worsened. IMPRESSION: Mild worsening of left lower lobe volume loss. Nasogastric tube tip only in the distal esophagus. These results will be called to the ordering clinician or representative by the Radiologist Assistant, and communication documented in the PACS or zVision Dashboard. Electronically Signed   By: Nelson Chimes M.D.   On: 07/30/2018 07:27   Dg Abd Portable 1v  Result Date: 07/29/2018 CLINICAL DATA:  Vomiting EXAM: PORTABLE ABDOMEN - 1 VIEW COMPARISON:  None. FINDINGS: Scattered large and small bowel gas is noted. Nasogastric catheter is noted within the stomach. No obstructive changes are noted. No bony abnormality is seen. IMPRESSION: No acute abnormality noted. Electronically Signed   By: Inez Catalina M.D.   On: 07/29/2018 12:21    STUDIES:  Renal US 7/28 >> no acute abnormality  CULTURES: BCx2 7/27 >>  BC 7/28>> UC 7/27 >> negative Cultures from 02/2018>> greater than 100,000 colonies unidentified organism>> BC x 2 7/30>>  Sputum  7/30>> sa pending SS>> MSSA  ANTIBIOTICS: Zosyn 7/30>>7/31 Vanc 7/31 x 1 dose till cultures back>> Ancef 7/31>>  SIGNIFICANT EVENTS: 7/27  Admit  7/28 Sedation adjusted to fentanyl drip. No follow commands 7/29 nephrology consulted 07/30/2018 continues to  improve  LINES/TUBES: ETT 7/27 >> 07/28/2018 right IJ hemodialysis cath>>  DISCUSSION: 47 year old with history of schizophrenia, goiter diabetes admitted with DKA due to non compliance with meds & AKI.  Progressive rise in serum creatinine with borderline urinary output. New fever 7/30 am. 07/28/2018 renal function continues to worsen she will require a hemodialysis catheter placement. Continues to improve metabolically  ASSESSMENT / PLAN:  PULMONARY A: Acute Respiratory Insufficiency - in the setting of acute metabolic encephalopathy  L basilar infiltrate Failed SBT 7/30 am P:   Ventilator bundle Wean per protocol Mental status precludes extubation at this time   CARDIOVASCULAR A:  Hypotension, lactic acidosis -resolved + 15 L since admission P:   To  monitor Pressor support as needed  RENAL Lab Results  Component Value Date   CREATININE 4.23 (H) 07/30/2018   CREATININE 4.20 (H) 07/29/2018   CREATININE 7.52 (H) 07/29/2018   CREATININE 0.84 10/22/2012   Recent Labs  Lab 07/29/18 0450 07/29/18 1646 07/30/18 0600  K 3.6 3.8 3.7   Recent Labs  Lab 07/29/18 0450 07/29/18 1646 07/30/18 0600  NA 143 141 141  ' A:   AKI - rising sr cr, borderline oliguric Hyperkalemia resolved AGMA - in setting of renal failure, DKA [place] no acute abnormalities + 15 L on 7/30 P:   Day 3 of CVVH Adjustments per nephrology  GASTROINTESTINAL A:   Morbid Obesity  Vomiting episodes 07/29/2018 P:   PPI. Abdominal film performed on 07/30/2018 without acute abnormality 07/30/2018 resume trickle tube feeding   HEMATOLOGIC Recent Labs    07/28/18 0511  HGB 10.8*    A:   Elevated hematocrit secondary to dehydration>> resolved Anemia P:  Trend CBC  INFECTIOUS A:   Temp spike to 101.8 on 7/30 early am Pro calcitonin bump 7/30 to 6.65 P:   MSSA sputum Continue Ancef.  Stop date in place  ENDOCRINE CBG (last 3)  Recent Labs    07/29/18 2341 07/30/18 0342  07/30/18 0734  GLUCAP 116* 140* 121*    A:   DKA Goiter, exophthalmos Hypothyroidism - TSH 6.379 on admit P:   DKA is resolved Glucose control Continue Synthroid   NEUROLOGIC A:   Acute Metabolic Encephalopathy - AKI, DKA, ? Post ictal Seizure- reported on admit, no hx of  CT head is unremarkable. P:   Remains on full mechanical ventilatory support Intermittently follows commands with   FAMILY  - Updates: 07/30/2018 no family at bedside  - Inter-disciplinary family meet or Palliative Care meeting due by:  8/5  CC Time: 30 minutes   Richardson Landry Cigi Bega ACNP Maryanna Shape PCCM Pager [phone] till 1 pm If no answer page [phone] 07/30/2018, 11:14 AM

## 2018-07-31 ENCOUNTER — Inpatient Hospital Stay (HOSPITAL_COMMUNITY): Payer: BLUE CROSS/BLUE SHIELD

## 2018-07-31 LAB — CBC
HCT: 29.8 % — ABNORMAL LOW (ref 36.0–46.0)
Hemoglobin: 9.2 g/dL — ABNORMAL LOW (ref 12.0–15.0)
MCH: 26.5 pg (ref 26.0–34.0)
MCHC: 30.9 g/dL (ref 30.0–36.0)
MCV: 85.9 fL (ref 78.0–100.0)
PLATELETS: 64 10*3/uL — AB (ref 150–400)
RBC: 3.47 MIL/uL — AB (ref 3.87–5.11)
RDW: 16.8 % — AB (ref 11.5–15.5)
WBC: 8.6 10*3/uL (ref 4.0–10.5)

## 2018-07-31 LAB — BASIC METABOLIC PANEL
ANION GAP: 9 (ref 5–15)
BUN: 23 mg/dL — ABNORMAL HIGH (ref 6–20)
CALCIUM: 7.9 mg/dL — AB (ref 8.9–10.3)
CO2: 25 mmol/L (ref 22–32)
Chloride: 105 mmol/L (ref 98–111)
Creatinine, Ser: 2.74 mg/dL — ABNORMAL HIGH (ref 0.44–1.00)
GFR, EST AFRICAN AMERICAN: 22 mL/min — AB (ref >60)
GFR, EST NON AFRICAN AMERICAN: 19 mL/min — AB (ref >60)
Glucose, Bld: 165 mg/dL — ABNORMAL HIGH (ref 70–99)
POTASSIUM: 3.5 mmol/L (ref 3.5–5.1)
SODIUM: 139 mmol/L (ref 135–145)

## 2018-07-31 LAB — GLUCOSE, CAPILLARY
GLUCOSE-CAPILLARY: 137 mg/dL — AB (ref 70–99)
GLUCOSE-CAPILLARY: 143 mg/dL — AB (ref 70–99)
Glucose-Capillary: 139 mg/dL — ABNORMAL HIGH (ref 70–99)
Glucose-Capillary: 120 mg/dL — ABNORMAL HIGH (ref 70–99)
Glucose-Capillary: 141 mg/dL — ABNORMAL HIGH (ref 70–99)
Glucose-Capillary: 61 mg/dL — ABNORMAL LOW (ref 70–99)
Glucose-Capillary: 92 mg/dL (ref 70–99)

## 2018-07-31 LAB — PROTIME-INR
INR: 1.2
PROTHROMBIN TIME: 15.1 s (ref 11.4–15.2)

## 2018-07-31 LAB — RENAL FUNCTION PANEL
ALBUMIN: 1.5 g/dL — AB (ref 3.5–5.0)
ANION GAP: 11 (ref 5–15)
BUN: 20 mg/dL (ref 6–20)
CALCIUM: 8.6 mg/dL — AB (ref 8.9–10.3)
CO2: 28 mmol/L (ref 22–32)
CREATININE: 2.24 mg/dL — AB (ref 0.44–1.00)
Chloride: 102 mmol/L (ref 98–111)
GFR, EST AFRICAN AMERICAN: 28 mL/min — AB (ref >60)
GFR, EST NON AFRICAN AMERICAN: 25 mL/min — AB (ref >60)
Glucose, Bld: 125 mg/dL — ABNORMAL HIGH (ref 70–99)
PHOSPHORUS: 3 mg/dL (ref 2.5–4.6)
Potassium: 3.5 mmol/L (ref 3.5–5.1)
SODIUM: 141 mmol/L (ref 135–145)

## 2018-07-31 LAB — PHOSPHORUS: PHOSPHORUS: 3.2 mg/dL (ref 2.5–4.6)

## 2018-07-31 LAB — POCT I-STAT EG7
ACID-BASE EXCESS: 6 mmol/L — AB (ref 0.0–2.0)
Acid-Base Excess: 6 mmol/L — ABNORMAL HIGH (ref 0.0–2.0)
Bicarbonate: 30.6 mmol/L — ABNORMAL HIGH (ref 20.0–28.0)
Bicarbonate: 30.7 mmol/L — ABNORMAL HIGH (ref 20.0–28.0)
CALCIUM ION: 1.03 mmol/L — AB (ref 1.15–1.40)
Calcium, Ion: 0.61 mmol/L — CL (ref 1.15–1.40)
HCT: 29 % — ABNORMAL LOW (ref 36.0–46.0)
HCT: 30 % — ABNORMAL LOW (ref 36.0–46.0)
HEMOGLOBIN: 10.2 g/dL — AB (ref 12.0–15.0)
Hemoglobin: 9.9 g/dL — ABNORMAL LOW (ref 12.0–15.0)
O2 SAT: 47 %
O2 SAT: 54 %
PCO2 VEN: 45.2 mmHg (ref 44.0–60.0)
PH VEN: 7.481 — AB (ref 7.250–7.430)
PO2 VEN: 25 mmHg — AB (ref 32.0–45.0)
PO2 VEN: 26 mmHg — AB (ref 32.0–45.0)
POTASSIUM: 3.2 mmol/L — AB (ref 3.5–5.1)
Potassium: 3.1 mmol/L — ABNORMAL LOW (ref 3.5–5.1)
Sodium: 140 mmol/L (ref 135–145)
Sodium: 140 mmol/L (ref 135–145)
TCO2: 32 mmol/L (ref 22–32)
TCO2: 32 mmol/L (ref 22–32)
pCO2, Ven: 40.8 mmHg — ABNORMAL LOW (ref 44.0–60.0)
pH, Ven: 7.439 — ABNORMAL HIGH (ref 7.250–7.430)

## 2018-07-31 LAB — MAGNESIUM: MAGNESIUM: 2.4 mg/dL (ref 1.7–2.4)

## 2018-07-31 LAB — APTT: APTT: 42 s — AB (ref 24–36)

## 2018-07-31 MED ORDER — SODIUM CHLORIDE 0.9% FLUSH: 3.0000 mL | INTRAVENOUS | Status: DC | PRN

## 2018-07-31 MED ORDER — HALOPERIDOL LACTATE 5 MG/ML IJ SOLN: 5.0000 mg | Freq: Four times a day (QID) | INTRAMUSCULAR | Status: DC | PRN

## 2018-07-31 MED ORDER — SODIUM CHLORIDE 0.9 % IV SOLN: 250.0000 mL | INTRAVENOUS | Status: DC | PRN

## 2018-07-31 MED ORDER — ORAL CARE MOUTH RINSE
15.0000 mL | Freq: Two times a day (BID) | OROMUCOSAL | Status: DC
  Administered 2018-08-01 – 2018-08-02 (×4): 15 mL via OROMUCOSAL

## 2018-07-31 MED ORDER — CHLORHEXIDINE GLUCONATE 0.12 % MT SOLN
15.0000 mL | Freq: Two times a day (BID) | OROMUCOSAL | Status: DC
  Administered 2018-07-31 – 2018-08-11 (×18): 15 mL via OROMUCOSAL
  Filled 2018-07-31 (×16): qty 15

## 2018-07-31 MED ORDER — HEPARIN SODIUM (PORCINE) 1000 UNIT/ML DIALYSIS
1000.0000 [IU] | INTRAMUSCULAR | Status: DC | PRN
  Filled 2018-07-31: qty 6

## 2018-07-31 MED ORDER — DEXTROSE 5 % IV SOLN
Status: DC
  Administered 2018-07-31 – 2018-08-02 (×10): via INTRAVENOUS_CENTRAL
  Filled 2018-07-31 (×7): qty 1500

## 2018-07-31 MED ORDER — LORAZEPAM 2 MG/ML IJ SOLN: 0.5000 mg | INTRAMUSCULAR | Status: DC | PRN

## 2018-07-31 MED ORDER — SODIUM CHLORIDE 0.9 % IJ SOLN
250.0000 [IU]/h | INTRAMUSCULAR | Status: DC
  Filled 2018-07-31: qty 2

## 2018-07-31 MED ORDER — SODIUM CHLORIDE 0.9% FLUSH: 3.0000 mL | Freq: Two times a day (BID) | INTRAVENOUS | Status: DC

## 2018-07-31 MED ORDER — FUROSEMIDE 10 MG/ML IJ SOLN
120.0000 mg | Freq: Once | INTRAVENOUS | Status: AC
  Administered 2018-07-31: 120 mg via INTRAVENOUS
  Filled 2018-07-31 (×2): qty 12
  Filled 2018-07-31: qty 10
  Filled 2018-07-31: qty 12

## 2018-07-31 MED ORDER — DEXTROSE 5 % IV SOLN
20.0000 g | INTRAVENOUS | Status: DC
  Administered 2018-07-31 – 2018-08-01 (×2): 20 g via INTRAVENOUS_CENTRAL
  Filled 2018-07-31 (×5): qty 200

## 2018-07-31 MED ORDER — FENTANYL CITRATE (PF) 100 MCG/2ML IJ SOLN: 12.5000 ug | INTRAMUSCULAR | Status: DC | PRN

## 2018-07-31 MED ORDER — HEPARIN BOLUS VIA INFUSION (CRRT)
1000.0000 [IU] | INTRAVENOUS | Status: DC | PRN
  Filled 2018-07-31: qty 1000

## 2018-07-31 MED ORDER — ANTICOAGULANT SODIUM CITRATE 4% (200MG/5ML) IV SOLN
5.0000 mL | Status: DC | PRN
  Administered 2018-08-02: 2.4 mL via INTRAVENOUS
  Filled 2018-07-31 (×4): qty 5

## 2018-07-31 NOTE — Progress Notes (Signed)
SLP Cancellation Note  Patient Details Name: [name] MRN: [id number] DOB: 03/30/1971   Cancelled treatment:  Attempted to see pt for swallowing evaluation.  Pt is presently intubated and is not appropriate for evaluation at this time.  SLP will re-attempt post-extubation when medically appropriate for PO trials.         [place] 07/31/2018, 12:57 PM

## 2018-07-31 NOTE — Progress Notes (Signed)
[facility] ROUNDING NOTE   Subjective:   Interval History: CRRT yesterday. I/Os 3.1/2.9; UOP 975myesterday.  Switched to precidex yesterday given apnea thought from fentanyl. Still on low dose phenylephrine.  Filter only lasting ~9-10h.   Objective:  Vital signs in last 24 hours:  Temp:  [96.5 F (35.8 C)-98.4 F (36.9 C)] 96.5 F (35.8 C) (08/03 0405) Pulse Rate:  [47-96] 58 (08/03 0600) Resp:  [12-22] 15 (08/03 0600) BP: (94-150)/(55-96) 104/74 (08/03 0600) SpO2:  [93 %-100 %] 100 % (08/03 0600) FiO2 (%):  [40 %] 40 % (08/03 0319) Weight:  [139.6 kg (307 lb 12.2 oz)] 139.6 kg (307 lb 12.2 oz) (08/03 0500)  Weight change: -0.4 kg (-14.1 oz) Filed Weights   07/29/18 0500 07/30/18 0500 07/31/18 0500  Weight: (!) 141.6 kg (312 lb 2.7 oz) (!) 140 kg (308 lb 10.3 oz) (!) 139.6 kg (307 lb 12.2 oz)    Intake/Output: I/O last 3 completed shifts: In: 4412.8 [I.V.:3584.4; Other:60; NG/GT:468.3; IV Piggyback:300.1] Out: 47341[Urine:1200; OPFXTK:2409]  Intake/Output this shift:  No intake/output data recorded.  General - ill-appearing female, sedated and intubated, does not follow commands  CVS- RRR, no mrg  RS- mechanical breath sounds anteriorly  ABD- BS present soft non-distended EXT- 1+ generalized edema   Basic Metabolic Panel: Recent Labs  Lab 07/28/18 0511 07/28/18 1623 07/29/18 0450 07/29/18 1646 07/30/18 0600 07/30/18 1709 07/31/18 0413  NA 147* 145 143 141 141 141 139  K 4.0 3.9 3.6 3.8 3.7 3.7 3.5  CL 117* 115* 110 107 104 107 105  CO2 17* 16* 22 23 24 26 25   GLUCOSE 378* 292* 171* 91 146* 97 165*  BUN 115* 105* 67* 37* 33* 26* 23*  CREATININE 12.54* 11.16* 7.52* 4.20* 4.23* 3.59* 2.74*  CALCIUM 6.5* 6.2* 7.1* 7.3* 7.8* 7.5* 7.9*  MG 2.0 1.9 2.0  --  2.4  --  2.4  PHOS 4.8* 4.5  4.4 4.0 2.6 3.7 3.1 3.2    Liver Function Tests: Recent Labs  Lab 07/24/18 1021 07/24/18 2010 07/26/18 0402 07/28/18 1623 07/29/18 1646 07/30/18 1709   AST 29 33 27  --   --   --   ALT 45* 41 32  --   --   --   ALKPHOS 118 114 96  --   --   --   BILITOT 1.5* 1.5* 0.7  --   --   --   PROT 7.9 6.9 6.0*  --   --   --   ALBUMIN 3.5 3.0* 2.2* 1.5* 1.6* 1.3*   Recent Labs  Lab 07/24/18 2010  AMYLASE 891*   No results for input(s): AMMONIA in the last 168 hours.  CBC: Recent Labs  Lab 07/24/18 1021  07/24/18 1704 07/25/18 0251 07/26/18 0722 07/27/18 0547 07/28/18 0511  WBC 11.5*  --  7.1 8.6 8.2 7.3 5.6  NEUTROABS 9.9*  --  5.3  --   --   --   --   HGB 14.2   < > 13.2 13.1 12.5 10.6* 10.8*  HCT 54.8*   < > 47.2* 43.4 41.3 35.5* 35.0*  MCV 107.2*  --  95.0 88.4 88.2 89.0 86.2  PLT 250  --  177 178 90* 86* 73*   < > = values in this interval not displayed.    Cardiac Enzymes: Recent Labs  Lab 07/24/18 1021 07/24/18 2010 07/24/18 2345 07/25/18 0251  TROPONINI <0.03 <0.03 0.03* 0.03*    BNP: Invalid input(s): POCBNP  CBG:  Recent Labs  Lab 07/30/18 1124 07/30/18 1516 07/30/18 2019 07/30/18 2345 07/31/18 0409  GLUCAP 130* 107* 88 161* 139*    Microbiology: Results for orders placed or performed during the hospital encounter of 07/24/18  Urine culture     Status: None   Collection Time: 07/24/18 10:20 AM  Result Value Ref Range Status   Specimen Description   Final    URINE, CLEAN CATCH Performed at [facility], [address], [place], [place] [address]    Special Requests   Final    NONE Performed at [facility], [address] [place], [place] [address]    Culture   Final    NO GROWTH Performed at [facility], [place] [address] [place], [place] [address]    Report Status 07/25/2018 FINAL  Final  Culture, blood (routine x 2)     Status: None   Collection Time: 07/24/18  4:49 PM  Result Value Ref Range Status   Specimen Description BLOOD LEFT FOOT  Final   Special Requests   Final    BOTTLES DRAWN AEROBIC ONLY Blood Culture results may not be optimal due to an inadequate volume of blood  received in culture bottles   Culture   Final    NO GROWTH 5 DAYS Performed at [facility], [place] [address], [place], [place] [address]    Report Status 07/29/2018 FINAL  Final  Urine culture     Status: None   Collection Time: 07/24/18  6:54 PM  Result Value Ref Range Status   Specimen Description URINE, RANDOM  Final   Special Requests NONE  Final   Culture   Final    NO GROWTH Performed at [facility], [place] [address] [place], [place] [address]    Report Status 07/25/2018 FINAL  Final  MRSA PCR Screening     Status: None   Collection Time: 07/24/18  9:03 PM  Result Value Ref Range Status   MRSA by PCR NEGATIVE NEGATIVE Final    Comment:        The GeneXpert MRSA Assay (FDA approved for NASAL specimens only), is one component of a comprehensive MRSA colonization surveillance program. It is not intended to diagnose MRSA infection nor to guide or monitor treatment for MRSA infections. Performed at [facility], [place] [address] [place],  [address]   Culture, blood (routine x 2)     Status: None   Collection Time: 07/25/18  5:18 AM  Result Value Ref Range Status   Specimen Description BLOOD LEFT HAND  Final   Special Requests   Final    BOTTLES DRAWN AEROBIC ONLY Blood Culture results may not be optimal due to an inadequate volume of blood received in culture bottles   Culture   Final    NO GROWTH 5 DAYS Performed at [facility], [place] [address] [place],  [address]    Report Status 07/30/2018 FINAL  Final  Urine Culture     Status: Abnormal   Collection Time: 07/27/18  4:03 AM  Result Value Ref Range Status   Specimen Description URINE, CATHETERIZED  Final   Special Requests Normal  Final   Culture (A)  Final    >=100,000 COLONIES/mL LACTOBACILLUS SPECIES Standardized susceptibility testing for this organism is not available. Performed at [facility], [place] [address], [place],  [address]    Report Status  07/28/2018 FINAL  Final  Culture, blood (routine x 2)  Status: None (Preliminary result)   Collection Time: 07/27/18  5:25 AM  Result Value Ref Range Status   Specimen Description BLOOD RIGHT HAND  Final   Special Requests IN PEDIATRIC BOTTLE Blood Culture adequate volume  Final   Culture   Final    NO GROWTH 3 DAYS Performed at [facility], [address] [place], [place] [address]    Report Status PENDING  Incomplete  Culture, blood (routine x 2)     Status: None (Preliminary result)   Collection Time: 07/27/18  5:45 AM  Result Value Ref Range Status   Specimen Description BLOOD RIGHT HAND  Final   Special Requests   Final    BOTTLES DRAWN AEROBIC ONLY Blood Culture adequate volume   Culture   Final    NO GROWTH 3 DAYS Performed at [facility], [place] [address], [place], [place] [address]    Report Status PENDING  Incomplete  Culture, respiratory (non-expectorated)     Status: None   Collection Time: 07/27/18 11:35 AM  Result Value Ref Range Status   Specimen Description TRACHEAL ASPIRATE  Final   Special Requests NONE  Final   Gram Stain   Final    ABUNDANT DEGENERATED CELLULAR MATERIAL PRESENT ABUNDANT GRAM POSITIVE COCCI IN PAIRS MODERATE GRAM VARIABLE ROD Performed at [facility], [place] [address] [place], [place] [address]    Culture ABUNDANT STAPHYLOCOCCUS AUREUS  Final   Report Status 07/30/2018 FINAL  Final   Organism ID, Bacteria STAPHYLOCOCCUS AUREUS  Final      Susceptibility   Staphylococcus aureus - MIC*    CIPROFLOXACIN 1 SENSITIVE Sensitive     ERYTHROMYCIN >=8 RESISTANT Resistant     GENTAMICIN <=0.5 SENSITIVE Sensitive     OXACILLIN 0.5 SENSITIVE Sensitive     TETRACYCLINE <=1 SENSITIVE Sensitive     VANCOMYCIN 1 SENSITIVE Sensitive     TRIMETH/SULFA <=10 SENSITIVE Sensitive     CLINDAMYCIN RESISTANT Resistant     RIFAMPIN <=0.5 SENSITIVE Sensitive     Inducible Clindamycin POSITIVE Resistant     * ABUNDANT STAPHYLOCOCCUS AUREUS     Coagulation Studies: No results for input(s): LABPROT, INR in the last 72 hours.  Urinalysis: No results for input(s): COLORURINE, LABSPEC, PHURINE, GLUCOSEU, HGBUR, BILIRUBINUR, KETONESUR, PROTEINUR, UROBILINOGEN, NITRITE, LEUKOCYTESUR in the last 72 hours.  Invalid input(s): APPERANCEUR    Imaging: Dg Abd 1 View  Result Date: 07/30/2018 CLINICAL DATA:  Orogastric tube placement. EXAM: ABDOMEN - 1 VIEW COMPARISON:  Radiographs of July 29, 2018. FINDINGS: The bowel gas pattern is normal. Distal tip of orogastric tube is seen in distal stomach. No radio-opaque calculi or other significant radiographic abnormality are seen. IMPRESSION: Distal tip of orogastric tube seen in distal stomach. No evidence of bowel obstruction or ileus. Electronically Signed   By: Marijo Conception, M.D.   On: 07/30/2018 08:59   Dg Chest Port 1 View  Result Date: 07/30/2018 CLINICAL DATA:  Follow-up respiratory failure. EXAM: PORTABLE CHEST 1 VIEW COMPARISON:  07/29/2018 FINDINGS: Endotracheal tube tip is 3 cm above the carina. Nasogastric tube extends only to the distal esophagus. Right internal jugular central line is unchanged with its tips in the SVC. Right lung is clear. Mild left lower lobe volume loss is slightly worsened. IMPRESSION: Mild worsening of left lower lobe volume loss. Nasogastric tube tip only in the distal esophagus. These results will be called to the ordering clinician or representative by the Radiologist Assistant, and communication documented in  the PACS or zVision Dashboard. Electronically Signed   By: Nelson Chimes M.D.   On: 07/30/2018 07:27   Dg Abd Portable 1v  Result Date: 07/30/2018 CLINICAL DATA:  Bedside feeding tube placement. EXAM: PORTABLE ABDOMEN - 1 VIEW 5:49 p.m.: COMPARISON:  Portable abdomen x-ray earlier same day 2:52 p.m. and 8:40 a.m. FINDINGS: The feeding tube which was previously looped in the stomach now likely has its tip in the fourth portion of the duodenum. The  nasogastric tube tip is now in the expected location of the duodenal bulb as it is directed inferiorly. Visualized bowel gas pattern unremarkable. IMPRESSION: 1. Feeding tube tip now likely in the fourth portion of the duodenum. 2. Nasogastric tube tip now likely in the duodenal bulb. Electronically Signed   By: Evangeline Dakin M.D.   On: 07/30/2018 18:06   Dg Abd Portable 1v  Result Date: 07/30/2018 CLINICAL DATA:  Feeding tube placement EXAM: PORTABLE ABDOMEN - 1 VIEW COMPARISON:  07/30/2018 FINDINGS: NG tube extends into the gastric antrum. Interval placement of feeding tube which extends into the stomach, loops in the gastric antrum, and tip positioned in the gastric body. IMPRESSION: Introduction feeding tube with tip in the gastric body. NG tube unchanged. Electronically Signed   By: Suzy Bouchard M.D.   On: 07/30/2018 15:07   Dg Abd Portable 1v  Result Date: 07/29/2018 CLINICAL DATA:  Vomiting EXAM: PORTABLE ABDOMEN - 1 VIEW COMPARISON:  None. FINDINGS: Scattered large and small bowel gas is noted. Nasogastric catheter is noted within the stomach. No obstructive changes are noted. No bony abnormality is seen. IMPRESSION: No acute abnormality noted. Electronically Signed   By: Inez Catalina M.D.   On: 07/29/2018 12:21     Medications:   . sodium chloride 10 mL/hr at 07/31/18 0600  . sodium chloride 20 mL/hr at 07/31/18 0600  .  ceFAZolin (ANCEF) IV Stopped (07/30/18 2235)  . dexmedetomidine (PRECEDEX) IV infusion 1 mcg/kg/hr (07/31/18 0600)  . feeding supplement (VITAL HIGH PROTEIN) 20 mL/hr at 07/31/18 0600  . phenylephrine (NEO-SYNEPHRINE) Adult infusion 10 mcg/min (07/31/18 0600)  . dialysis replacement fluid (prismasate) 500 mL/hr at 07/30/18 2231  . dialysis replacement fluid (prismasate) 500 mL/hr at 07/30/18 2357  . dialysate (PRISMASATE) 1,500 mL/hr at 07/30/18 2358  . sodium chloride 999 mL/hr at 07/30/18 2031   . chlorhexidine gluconate (MEDLINE KIT)  15 mL Mouth Rinse BID   . free water  250 mL Per Tube Q4H  . Gerhardt's butt cream   Topical BID  . heparin  5,000 Units Subcutaneous Q8H  . insulin aspart  0-20 Units Subcutaneous Q4H  . insulin aspart  7 Units Subcutaneous Q4H  . insulin detemir  20 Units Subcutaneous Q12H  . levothyroxine  125 mcg Per Tube QAC breakfast  . mouth rinse  15 mL Mouth Rinse 10 times per day  . metoCLOPramide (REGLAN) injection  5 mg Intravenous Q6H  . pantoprazole sodium  40 mg Per Tube Daily   sodium chloride, Place/Maintain arterial line **AND** sodium chloride, acetaminophen (TYLENOL) oral liquid 160 mg/5 mL, dextrose, fentaNYL (SUBLIMAZE) injection, heparin, ipratropium-albuterol, midazolam, ondansetron (ZOFRAN) IV, sodium chloride  Assessment/ Plan:  Patient is a47 y.o.femalewith a PMHx of uncontrolled T2DM, hypothyroidisms/p RIA due to Grave's disease, HTN,schizophrenia, and medication noncompliance x 2-3 monthswho was admitted [facility]2019for evaluationacute metabolic encephalopathy and was found to be in DKA and acute renal failure.  1. Severe oliguric AKI: Likely secondary to severe dehydration in the setting of DKA and  ATN from transient hypotension after initiation of propofol for sedation as well as possible NSAID and ACEi use at home.Could also have a component of CKD. Baseline Cr unclear but last Cr 1.2 in 06/2015.Started CRRT for volume overload and oliguria 7/31, BUN ~100.   --Clotting filter after only 9-10h; will check plt count (had been holding heparin in light of thrombocytopenia) --Give lasix 120 IV now while we work out if heparin can be used through circuit --Plan aim for ideally 2L net negative today as possible (UOP + CRRT) --Based on BUN/Cr I anticipate she'll need RRT still, at least in the short term   2. Hypernatremiawith hyperchloremia: Resolved. Will continue free water flushes and titrate as needed.   - Free water 400 mL q4h per OG tube  - Continue to monitor Na  3.  Thrombocytopenia:New.We were holding heparin with CRRT but given short filter life would like to use -- check coags/plt and start heparin if those are reasonable.   4. QSO:XUJN per CCM.  Appears resolved.  5. HTN: on several antihypertensive medications at home including amlodipine 10 mg QD, hydralazine 58m TID, HCTZ 25 mg QD AND lisinopril-HCTZ 10-12.5 mgQd, and metoprolol 50 mg BID. Unclear if she has been taking all of these meds at home. -Holding currently in light of shock.   LOS: 7 KJustin Mend7/31/2019 7:31 AM

## 2018-07-31 NOTE — Procedures (Signed)
Extubation Procedure Note  Patient Details:   Name: [name] DOB: 07-14-1971 MRN: [id number]   Airway Documentation:  Airway 7 mm (Active)  Secured at (cm) 23 cm 07/31/2018 11:37 AM  Measured From Lips 07/31/2018 11:37 AM  Secured Location Right 07/31/2018 11:37 AM  Secured By [employer] 07/31/2018 11:37 AM  Tube Holder Repositioned Yes 07/31/2018 11:37 AM  Cuff Pressure (cm H2O) 26 cm H2O 07/30/2018 11:16 PM  Site Condition Dry 07/31/2018 11:37 AM   Vent end date: (not recorded) Vent end time: (not recorded)   Evaluation  O2 sats: stable throughout Complications: No apparent complications Patient did tolerate procedure well. Bilateral Breath Sounds: Diminished   Yes   Patient extubated to [place]. Vital signs stable at this time. No complications. RN at bedside. RT will continue to monitor.  Mcneil Sober 07/31/2018, 2:45 PM

## 2018-07-31 NOTE — Progress Notes (Signed)
PULMONARY / CRITICAL CARE MEDICINE   Name: [name] MRN: [id number] DOB: 12/27/1971    ADMISSION DATE:  07/24/2018 CONSULTATION DATE:  07/24/2018  REFERRING MD:  EDP  CHIEF COMPLAINT:  Altered mental status, Elevated BS  HISTORY OF PRESENT ILLNESS:   47 year old with history of hypertension, lupus, goiter, paranoid schizophrenia, diabetes went to [facility] ED with altered mental status.  EMS called by family as she was sick for 3 days and lying on the couch.  Patient had seizure activity with EMS and given 5 mg of Versed. Found to have hypoglycemia with blood sugars of 1556, anion gap acidosis, elevated lactic acid. Intubated for altered mental status in the ED and transferred to [facility].  As per the family she did not have any fevers, nausea, vomiting, diarrhea, pain prior to this.  They are not aware that she has diabetes but she does have metformin on her med list.  There are no prior history of seizures.  She had not been taking any of her other medications except psychiatric medications for the past 2 to 3 months.  Family has noticed that she was having excessive urination over the past few weeks.   SUBJECTIVE:  Weaned off pressors this morning.  Tolerating CRRT (negative 100/hour),  Tolerating Precedex with increased alertness intermittently follows commands Weaning this morning tolerating well with no increased work of breathing    VITAL SIGNS: BP (!) 77/64   Pulse (!) 58   Temp (!) 96.5 F (35.8 C) (Axillary)   Resp 17   Ht 5\' 9"  (1.753 m)   Wt (!) 307 lb 12.2 oz (139.6 kg)   LMP 09/18/2012   SpO2 (!) 72%   BMI 45.45 kg/m   HEMODYNAMICS:    VENTILATOR SETTINGS: Vent Mode: PRVC FiO2 (%):  [40 %] 40 % Set Rate:  [20 bmp] 20 bmp Vt Set:  [530 mL] 530 mL PEEP:  [5 cmH20] 5 cmH20 Plateau Pressure:  [14 cmH20-20 cmH20] 14 cmH20  INTAKE / OUTPUT: I/O last 3 completed shifts: In: 4412.8 [I.V.:3584.4; Other:60; NG/GT:468.3; IV  Piggyback:300.1] Out: 0076 [Urine:1200; AUQJF:3545]  PHYSICAL EXAMINATION: General: Morbidly obese female on vent  HEENT: Atraumatic normocephalic, ETT Neuro: Arousable follows commands intermittently  CV: Regular rate and rhythm, no MRG PULM: Decreased breath sounds in the bases  GY:BWLS, obese, soft, bowel sounds positive, trickle tube feeds Extremities: Warm dry, general edema Skin: Heel ulcers with dressings in place     LABS:  BMET Recent Labs  Lab 07/30/18 0600 07/30/18 1709 07/31/18 0413  NA 141 141 139  K 3.7 3.7 3.5  CL 104 107 105  CO2 24 26 25   BUN 33* 26* 23*  CREATININE 4.23* 3.59* 2.74*  GLUCOSE 146* 97 165*    Electrolytes Recent Labs  Lab 07/29/18 0450  07/30/18 0600 07/30/18 1709 07/31/18 0413  CALCIUM 7.1*   < > 7.8* 7.5* 7.9*  MG 2.0  --  2.4  --  2.4  PHOS 4.0   < > 3.7 3.1 3.2   < > = values in this interval not displayed.    CBC Recent Labs  Lab 07/26/18 0722 07/27/18 0547 07/28/18 0511  WBC 8.2 7.3 5.6  HGB 12.5 10.6* 10.8*  HCT 41.3 35.5* 35.0*  PLT 90* 86* 73*    Coag's Recent Labs  Lab 07/24/18 1021  INR 1.14    Sepsis Markers Recent Labs  Lab 07/25/18 0251 07/25/18 9373 07/27/18 0547 07/28/18 0511 07/28/18 0515 07/29/18 0450  LATICACIDVEN  1.5 2.1*  --   --  1.2  --   PROCALCITON  --   --  6.65 11.61  --  5.97    ABG Recent Labs  Lab 07/24/18 1200 07/25/18 0412  PHART 7.232* 7.333*  PCO2ART 34.4 32.8  PO2ART 228* 85.0    Liver Enzymes Recent Labs  Lab 07/24/18 1021 07/24/18 2010 07/26/18 0402 07/28/18 1623 07/29/18 1646 07/30/18 1709  AST 29 33 27  --   --   --   ALT 45* 41 32  --   --   --   ALKPHOS 118 114 96  --   --   --   BILITOT 1.5* 1.5* 0.7  --   --   --   ALBUMIN 3.5 3.0* 2.2* 1.5* 1.6* 1.3*    Cardiac Enzymes Recent Labs  Lab 07/24/18 2010 07/24/18 2345 07/25/18 0251  TROPONINI <0.03 0.03* 0.03*    Glucose Recent Labs  Lab 07/30/18 1124 07/30/18 1516 07/30/18 2019  07/30/18 2345 07/31/18 0409 07/31/18 0758  GLUCAP 130* 107* 88 161* 139* 143*    Imaging Dg Abd 1 View  Result Date: 07/30/2018 CLINICAL DATA:  Orogastric tube placement. EXAM: ABDOMEN - 1 VIEW COMPARISON:  Radiographs of July 29, 2018. FINDINGS: The bowel gas pattern is normal. Distal tip of orogastric tube is seen in distal stomach. No radio-opaque calculi or other significant radiographic abnormality are seen. IMPRESSION: Distal tip of orogastric tube seen in distal stomach. No evidence of bowel obstruction or ileus. Electronically Signed   By: Marijo Conception, M.D.   On: 07/30/2018 08:59   Dg Chest Port 1 View  Result Date: 07/31/2018 CLINICAL DATA:  Respiratory failure EXAM: PORTABLE CHEST 1 VIEW COMPARISON:  07/30/2018 FINDINGS: Cardiac shadow is stable. Endotracheal tube, nasogastric catheter and right jugular central line are again seen and stable. Feeding catheter is noted as well. The lungs are well aerated bilaterally. Minimal left basilar atelectasis is seen. No bony abnormality is noted. IMPRESSION: Tubes and lines as described. Mild left basilar atelectasis is noted. Electronically Signed   By: Inez Catalina M.D.   On: 07/31/2018 07:48   Dg Abd Portable 1v  Result Date: 07/30/2018 CLINICAL DATA:  Bedside feeding tube placement. EXAM: PORTABLE ABDOMEN - 1 VIEW 5:49 p.m.: COMPARISON:  Portable abdomen x-ray earlier same day 2:52 p.m. and 8:40 a.m. FINDINGS: The feeding tube which was previously looped in the stomach now likely has its tip in the fourth portion of the duodenum. The nasogastric tube tip is now in the expected location of the duodenal bulb as it is directed inferiorly. Visualized bowel gas pattern unremarkable. IMPRESSION: 1. Feeding tube tip now likely in the fourth portion of the duodenum. 2. Nasogastric tube tip now likely in the duodenal bulb. Electronically Signed   By: Evangeline Dakin M.D.   On: 07/30/2018 18:06   Dg Abd Portable 1v  Result Date: 07/30/2018 CLINICAL  DATA:  Feeding tube placement EXAM: PORTABLE ABDOMEN - 1 VIEW COMPARISON:  07/30/2018 FINDINGS: NG tube extends into the gastric antrum. Interval placement of feeding tube which extends into the stomach, loops in the gastric antrum, and tip positioned in the gastric body. IMPRESSION: Introduction feeding tube with tip in the gastric body. NG tube unchanged. Electronically Signed   By: Suzy Bouchard M.D.   On: 07/30/2018 15:07    STUDIES:  Renal US 7/28 >> no acute abnormality  CULTURES: BCx2 7/27 >>  BC 7/28>> UC 7/27 >> negative Cultures from 02/2018>> greater than  100,000 colonies unidentified organism>> BC x 2 7/30>>  Sputum  7/30>> sa pending SS>> MSSA UC 7/31 >100K lactobacillus   ANTIBIOTICS: Zosyn 7/30>>7/31 Vanc 7/31 x 1 dose till cultures back>> Ancef 7/31>>  SIGNIFICANT EVENTS: 7/27  Admit  7/28 Sedation adjusted to fentanyl drip. No follow commands 7/29 nephrology consulted 07/30/2018 continues to improve  LINES/TUBES: ETT 7/27 >> 07/28/2018 right IJ hemodialysis cath>>  DISCUSSION: 47 year old with history of schizophrenia, goiter diabetes admitted with DKA due to non compliance with meds & AKI.  Progressive rise in serum creatinine with borderline urinary output. New fever 7/30 am. 07/28/2018 renal function continues to worsen she will require a hemodialysis catheter placement. Weaning on vent and pressors off . tolerating CRRT   ASSESSMENT / PLAN:  PULMONARY A: Acute Respiratory Insufficiency - in the setting of acute metabolic encephalopathy  L basilar infiltrate 8/3 Weaning on vent  P:   Ventilator bundle Wean per protocol If cont to wean well and mental status cont to improve , eval for extubation later today   CARDIOVASCULAR A:  Hypotension, lactic acidosis -resolved + 17 L since admission 8/3 weaned off pressors  P:  Monitor   RENAL Lab Results  Component Value Date   CREATININE 2.74 (H) 07/31/2018   CREATININE 3.59 (H) 07/30/2018    CREATININE 4.23 (H) 07/30/2018   CREATININE 0.84 10/22/2012   Recent Labs  Lab 07/30/18 0600 07/30/18 1709 07/31/18 0413  K 3.7 3.7 3.5   Recent Labs  Lab 07/30/18 0600 07/30/18 1709 07/31/18 0413  NA 141 141 139  ' A:   AKI - rising sr cr, borderline oliguric Hyperkalemia resolved AGMA - in setting of renal failure, DKA>resolved [place] no acute abnormalities 8/3 17L+ since admit, tolerating CRRT (neg 100/hr )   P:   Day 4 of CVVH Adjustments per nephrology Lasix 8/3 per renal  Cont free water   GASTROINTESTINAL A:   Morbid Obesity  Vomiting episodes 07/29/2018 P:   PPI. Abdominal film performed on 07/30/2018 without acute abnormality 07/30/2018 resume trickle tube feeding, s/p post pyloric cortrak  Cont reglan, end date in place   HEMATOLOGIC  A:   Elevated hematocrit secondary to dehydration>> resolved Anemia Thrombocytopenia  P:  Trend CBC  coags pending   INFECTIOUS A:   Temp spike to 101.8 on 7/30 early am Pro calcitonin bump 7/30 to 6.65 P:   MSSA sputum Continue Ancef.  Stop date in place  ENDOCRINE CBG (last 3)  Recent Labs    07/30/18 2345 07/31/18 0409 07/31/18 0758  GLUCAP 161* 139* 143*    A:   DKA Goiter, exophthalmos Hypothyroidism - TSH 6.379 on admit P:   DKA is resolved Glucose control Continue Synthroid SSI    NEUROLOGIC A:   Acute Metabolic Encephalopathy - AKI, DKA, ? Post ictal Seizure- reported on admit, no hx of  CT head is unremarkable. P:   Remains on full mechanical ventilatory support Intermittently follows commands  Cont precedex    FAMILY  - Updates: 07/30/2018 no family at bedside  - Inter-disciplinary family meet or Palliative Care meeting due by:  8/5    Tammy Parrett NP-C  [facility]  [phone]   07/31/2018, 8:34 AM

## 2018-08-01 ENCOUNTER — Inpatient Hospital Stay (HOSPITAL_COMMUNITY): Payer: BLUE CROSS/BLUE SHIELD

## 2018-08-01 LAB — POCT I-STAT, CHEM 8
BUN: 8 mg/dL (ref 6–20)
BUN: 10 mg/dL (ref 6–20)
BUN: 8 mg/dL (ref 6–20)
CALCIUM ION: 0.5 mmol/L — AB (ref 1.15–1.40)
CALCIUM ION: 0.49 mmol/L — AB (ref 1.15–1.40)
CHLORIDE: 87 mmol/L — AB (ref 98–111)
Calcium, Ion: 1.01 mmol/L — ABNORMAL LOW (ref 1.15–1.40)
Chloride: 85 mmol/L — ABNORMAL LOW (ref 98–111)
Chloride: 85 mmol/L — ABNORMAL LOW (ref 98–111)
Creatinine, Ser: 1.4 mg/dL — ABNORMAL HIGH (ref 0.44–1.00)
Creatinine, Ser: 1.4 mg/dL — ABNORMAL HIGH (ref 0.44–1.00)
Creatinine, Ser: 1.8 mg/dL — ABNORMAL HIGH (ref 0.44–1.00)
Glucose, Bld: 260 mg/dL — ABNORMAL HIGH (ref 70–99)
Glucose, Bld: 287 mg/dL — ABNORMAL HIGH (ref 70–99)
Glucose, Bld: 296 mg/dL — ABNORMAL HIGH (ref 70–99)
HEMATOCRIT: 32 % — AB (ref 36.0–46.0)
HEMATOCRIT: 29 % — AB (ref 36.0–46.0)
HEMATOCRIT: 30 % — AB (ref 36.0–46.0)
HEMOGLOBIN: 9.9 g/dL — AB (ref 12.0–15.0)
Hemoglobin: 10.9 g/dL — ABNORMAL LOW (ref 12.0–15.0)
Hemoglobin: 10.2 g/dL — ABNORMAL LOW (ref 12.0–15.0)
POTASSIUM: 3.3 mmol/L — AB (ref 3.5–5.1)
Potassium: 3.3 mmol/L — ABNORMAL LOW (ref 3.5–5.1)
Potassium: 3.4 mmol/L — ABNORMAL LOW (ref 3.5–5.1)
SODIUM: 140 mmol/L (ref 135–145)
SODIUM: 139 mmol/L (ref 135–145)
SODIUM: 140 mmol/L (ref 135–145)
TCO2: 38 mmol/L — ABNORMAL HIGH (ref 22–32)
TCO2: 36 mmol/L — AB (ref 22–32)
TCO2: 39 mmol/L — AB (ref 22–32)

## 2018-08-01 LAB — RENAL FUNCTION PANEL
Albumin: 1.5 g/dL — ABNORMAL LOW (ref 3.5–5.0)
Albumin: 1.7 g/dL — ABNORMAL LOW (ref 3.5–5.0)
Anion gap: 13 (ref 5–15)
Anion gap: 16 — ABNORMAL HIGH (ref 5–15)
BUN: 17 mg/dL (ref 6–20)
BUN: 12 mg/dL (ref 6–20)
CALCIUM: 8.5 mg/dL — AB (ref 8.9–10.3)
CHLORIDE: 93 mmol/L — AB (ref 98–111)
CHLORIDE: 89 mmol/L — AB (ref 98–111)
CO2: 32 mmol/L (ref 22–32)
CO2: 35 mmol/L — ABNORMAL HIGH (ref 22–32)
CREATININE: 2.35 mg/dL — AB (ref 0.44–1.00)
CREATININE: 2.15 mg/dL — AB (ref 0.44–1.00)
Calcium: 8.8 mg/dL — ABNORMAL LOW (ref 8.9–10.3)
GFR calc non Af Amer: 23 mL/min — ABNORMAL LOW (ref >60)
GFR calc non Af Amer: 26 mL/min — ABNORMAL LOW (ref >60)
GFR, EST AFRICAN AMERICAN: 27 mL/min — AB (ref >60)
GFR, EST AFRICAN AMERICAN: 30 mL/min — AB (ref >60)
Glucose, Bld: 203 mg/dL — ABNORMAL HIGH (ref 70–99)
Glucose, Bld: 249 mg/dL — ABNORMAL HIGH (ref 70–99)
Phosphorus: 2.9 mg/dL (ref 2.5–4.6)
Phosphorus: 3 mg/dL (ref 2.5–4.6)
Potassium: 2.9 mmol/L — ABNORMAL LOW (ref 3.5–5.1)
Potassium: 3.2 mmol/L — ABNORMAL LOW (ref 3.5–5.1)
SODIUM: 138 mmol/L (ref 135–145)
Sodium: 140 mmol/L (ref 135–145)

## 2018-08-01 LAB — POCT I-STAT EG7
Acid-Base Excess: 14 mmol/L — ABNORMAL HIGH (ref 0.0–2.0)
Acid-Base Excess: 13 mmol/L — ABNORMAL HIGH (ref 0.0–2.0)
Bicarbonate: 39 mmol/L — ABNORMAL HIGH (ref 20.0–28.0)
Bicarbonate: 39.6 mmol/L — ABNORMAL HIGH (ref 20.0–28.0)
Calcium, Ion: 1.01 mmol/L — ABNORMAL LOW (ref 1.15–1.40)
Calcium, Ion: 0.53 mmol/L — CL (ref 1.15–1.40)
HCT: 31 % — ABNORMAL LOW (ref 36.0–46.0)
HEMATOCRIT: 31 % — AB (ref 36.0–46.0)
HEMOGLOBIN: 10.5 g/dL — AB (ref 12.0–15.0)
Hemoglobin: 10.5 g/dL — ABNORMAL LOW (ref 12.0–15.0)
O2 SAT: 58 %
O2 Saturation: 51 %
PCO2 VEN: 47.3 mmHg (ref 44.0–60.0)
PCO2 VEN: 57.6 mmHg (ref 44.0–60.0)
PH VEN: 7.445 — AB (ref 7.250–7.430)
PO2 VEN: 25 mmHg — AB (ref 32.0–45.0)
PO2 VEN: 30 mmHg — AB (ref 32.0–45.0)
POTASSIUM: 3.4 mmol/L — AB (ref 3.5–5.1)
POTASSIUM: 3.1 mmol/L — AB (ref 3.5–5.1)
Patient temperature: 98.5
Patient temperature: 98.4
Sodium: 139 mmol/L (ref 135–145)
Sodium: 140 mmol/L (ref 135–145)
TCO2: 40 mmol/L — AB (ref 22–32)
TCO2: 41 mmol/L — ABNORMAL HIGH (ref 22–32)
pH, Ven: 7.524 — ABNORMAL HIGH (ref 7.250–7.430)

## 2018-08-01 LAB — CULTURE, BLOOD (ROUTINE X 2)
Culture: NO GROWTH
Culture: NO GROWTH
Special Requests: ADEQUATE
Special Requests: ADEQUATE

## 2018-08-01 LAB — CALCIUM, IONIZED: Calcium, Ionized, Serum: 4.6 mg/dL (ref 4.5–5.6)

## 2018-08-01 LAB — CBC
HEMATOCRIT: 27.6 % — AB (ref 36.0–46.0)
HEMOGLOBIN: 8.7 g/dL — AB (ref 12.0–15.0)
MCH: 26.2 pg (ref 26.0–34.0)
MCHC: 31.5 g/dL (ref 30.0–36.0)
MCV: 83.1 fL (ref 78.0–100.0)
Platelets: 80 10*3/uL — ABNORMAL LOW (ref 150–400)
RBC: 3.32 MIL/uL — AB (ref 3.87–5.11)
RDW: 15.9 % — ABNORMAL HIGH (ref 11.5–15.5)
WBC: 10.2 10*3/uL (ref 4.0–10.5)

## 2018-08-01 LAB — GLUCOSE, CAPILLARY
GLUCOSE-CAPILLARY: 228 mg/dL — AB (ref 70–99)
GLUCOSE-CAPILLARY: 262 mg/dL — AB (ref 70–99)
Glucose-Capillary: 178 mg/dL — ABNORMAL HIGH (ref 70–99)
Glucose-Capillary: 199 mg/dL — ABNORMAL HIGH (ref 70–99)
Glucose-Capillary: 235 mg/dL — ABNORMAL HIGH (ref 70–99)
Glucose-Capillary: 190 mg/dL — ABNORMAL HIGH (ref 70–99)

## 2018-08-01 LAB — HEPARIN INDUCED PLATELET AB (HIT ANTIBODY): HEPARIN INDUCED PLT AB: 0.302 {OD_unit} (ref 0.000–0.400)

## 2018-08-01 LAB — HEMOGLOBIN A1C
Hgb A1c MFr Bld: 16.3 % — ABNORMAL HIGH (ref 4.8–5.6)
Mean Plasma Glucose: 421.11 mg/dL

## 2018-08-01 LAB — APTT: APTT: 40 s — AB (ref 24–36)

## 2018-08-01 MED ORDER — FREE WATER
100.0000 mL | Freq: Four times a day (QID) | Status: DC
  Administered 2018-08-01 – 2018-08-05 (×17): 100 mL

## 2018-08-01 MED ORDER — RACEPINEPHRINE HCL 2.25 % IN NEBU
0.5000 mL | INHALATION_SOLUTION | Freq: Once | RESPIRATORY_TRACT | Status: AC
  Administered 2018-08-01: 0.5 mL via RESPIRATORY_TRACT
  Filled 2018-08-01: qty 0.5

## 2018-08-01 MED ORDER — POTASSIUM CHLORIDE 20 MEQ/15ML (10%) PO SOLN
40.0000 meq | Freq: Once | ORAL | Status: AC
  Administered 2018-08-01: 40 meq
  Filled 2018-08-01: qty 30

## 2018-08-01 MED ORDER — DEXAMETHASONE SODIUM PHOSPHATE 4 MG/ML IJ SOLN
4.0000 mg | Freq: Four times a day (QID) | INTRAMUSCULAR | Status: AC
  Administered 2018-08-01 – 2018-08-02 (×4): 4 mg via INTRAVENOUS
  Filled 2018-08-01 (×4): qty 1

## 2018-08-01 MED ORDER — RISPERIDONE 1 MG/ML PO SOLN
3.0000 mg | Freq: Every day | ORAL | Status: DC
  Administered 2018-08-01: 3 mg via ORAL
  Filled 2018-08-01 (×2): qty 3

## 2018-08-01 NOTE — Progress Notes (Addendum)
PULMONARY / CRITICAL CARE MEDICINE   Name: [name] MRN: [id number] DOB: 02-03-1971    ADMISSION DATE:  07/24/2018 CONSULTATION DATE:  07/24/2018  REFERRING MD:  EDP  CHIEF COMPLAINT:  Altered mental status, Elevated BS  HISTORY OF PRESENT ILLNESS:   47 year old with history of hypertension, lupus, goiter, paranoid schizophrenia, diabetes went to [facility] ED with altered mental status.  EMS called by family as she was sick for 3 days and lying on the couch.  Patient had seizure activity with EMS and given 5 mg of Versed. Found to have hypoglycemia with blood sugars of 1556, anion gap acidosis, elevated lactic acid. Intubated for altered mental status in the ED and transferred to [facility].  As per the family she did not have any fevers, nausea, vomiting, diarrhea, pain prior to this.  They are not aware that she has diabetes but she does have metformin on her med list.  There are no prior history of seizures.  She had not been taking any of her other medications except psychiatric medications for the past 2 to 3 months.  Family has noticed that she was having excessive urination over the past few weeks.   SUBJECTIVE:  Extubated 8/3 , O2 sats good on 2L  Alert and following commands. Complains of hoarseness. , thick mucus and oral secretions. Some drooling  Speech eval this am, failed swallow test this am.  Remains on CRRT ,overnight b/p soft  neg 1 L balance and wt down 6lb x 24hr     VITAL SIGNS: BP (!) 89/51   Pulse 96   Temp 98 F (36.7 C) (Oral)   Resp 18   Ht 5\' 9"  (1.753 m)   Wt (!) 301 lb 13 oz (136.9 kg)   LMP 09/18/2012   SpO2 98%   BMI 44.57 kg/m   HEMODYNAMICS:    VENTILATOR SETTINGS: Vent Mode: CPAP;PSV FiO2 (%):  [40 %] 40 % PEEP:  [5 cmH20] 5 cmH20 Pressure Support:  [5 cmH20] 5 cmH20  INTAKE / OUTPUT: I/O last 3 completed shifts: In: 4453.7 [I.V.:2949; Other:185; NG/GT:957.7; IV Piggyback:362] Out: 0865 [Urine:1425;  Other:4376]  PHYSICAL EXAMINATION: General: Morbidly obese female resting in bed HEENT: Atraumatic normocephalic, increased oral secretions Mild upper airway stridor Neuro: Alert and oriented follows commands, calm CV: RRR, no MRG PULM: Scattered rhonchi, no accessory use or increased work of breathing GI: Soft, obese, positive bowel sounds, NGT Extremities: Warm and dry, general edema Skin: Heel ulcers/dressings     LABS:  BMET Recent Labs  Lab 07/31/18 0413 07/31/18 1719 07/31/18 2201 07/31/18 2208 08/01/18 0446  NA 139 141 140 140 138  K 3.5 3.5 3.2* 3.1* 2.9*  CL 105 102  --   --  93*  CO2 25 28  --   --  32  BUN 23* 20  --   --  17  CREATININE 2.74* 2.24*  --   --  2.35*  GLUCOSE 165* 125*  --   --  203*    Electrolytes Recent Labs  Lab 07/29/18 0450  07/30/18 0600  07/31/18 0413 07/31/18 1719 08/01/18 0446  CALCIUM 7.1*   < > 7.8*   < > 7.9* 8.6* 8.5*  MG 2.0  --  2.4  --  2.4  --   --   PHOS 4.0   < > 3.7   < > 3.2 3.0 2.9   < > = values in this interval not displayed.    CBC Recent Labs  Lab 07/28/18 0511 07/31/18 0806 07/31/18 2201 07/31/18 2208 08/01/18 0446  WBC 5.6 8.6  --   --  10.2  HGB 10.8* 9.2* 10.2* 9.9* 8.7*  HCT 35.0* 29.8* 30.0* 29.0* 27.6*  PLT 73* 64*  --   --  80*    Coag's Recent Labs  Lab 07/31/18 0806 08/01/18 0446  APTT 42* 40*  INR 1.20  --     Sepsis Markers Recent Labs  Lab 07/25/18 0852 07/27/18 0547 07/28/18 0511 07/28/18 0515 07/29/18 0450  LATICACIDVEN 2.1*  --   --  1.2  --   PROCALCITON  --  6.65 11.61  --  5.97    ABG No results for input(s): PHART, PCO2ART, PO2ART in the last 168 hours.  Liver Enzymes Recent Labs  Lab 07/26/18 0402  07/30/18 1709 07/31/18 1719 08/01/18 0446  AST 27  --   --   --   --   ALT 32  --   --   --   --   ALKPHOS 96  --   --   --   --   BILITOT 0.7  --   --   --   --   ALBUMIN 2.2*   < > 1.3* 1.5* 1.5*   < > = values in this interval not displayed.     Cardiac Enzymes No results for input(s): TROPONINI, PROBNP in the last 168 hours.  Glucose Recent Labs  Lab 07/31/18 1540 07/31/18 1957 07/31/18 2031 07/31/18 2331 08/01/18 0342 08/01/18 0739  GLUCAP 141* 61* 92 137* 178* 199*    Imaging No results found.  STUDIES:  Renal US 7/28 >> no acute abnormality  CULTURES: BCx2 7/27 >> NEG  BC 7/28>>NEG  UC 7/27 >> negative Cultures from 02/2018>> greater than 100,000 colonies unidentified organism>> BC x 2 7/30>>  Sputum  7/30>> sa pending SS>> MSSA UC 7/31 >100K lactobacillus   ANTIBIOTICS: Zosyn 7/30>>7/31 Vanc 7/31 x 1 dose till cultures back>> Ancef 7/31>>  SIGNIFICANT EVENTS: 7/27  Admit  7/28 Sedation adjusted to fentanyl drip. No follow commands 7/29 nephrology consulted 07/30/2018 continues to improve  LINES/TUBES: ETT 7/27 >>8/3 07/28/2018 right IJ hemodialysis cath>>  DISCUSSION: 47 year old with history of schizophrenia, goiter diabetes admitted with DKA due to non compliance with meds & AKI.  Progressive rise in serum creatinine with borderline urinary output. New fever 7/30 am. 07/28/2018 renal function continues to worsen she will require a hemodialysis catheter placement. Extubated 8/3, remains on CRRT   ASSESSMENT / PLAN:  PULMONARY A: Acute Respiratory Insufficiency - in the setting of acute metabolic encephalopathy -Improved extubated 8/3  L basilar infiltrate-MSSA in sputum >CXR cleared 8/4  Mild Upper airway Stridor  P:   O2 to keep sats >90%.  Decadron 4mg  every 6hr x 4 doses  Racemic Epi Neb x 1   If declines , consider adding BIPAP   CARDIOVASCULAR A:  Hypotension>improved , off pressors 8/3  lactic acidosis -resolved + 16 L since admission 8/4- b/p was soft overnight but improved this am  HTN - on norvasc/ace/hydralazine and metoprolol at home -on hold  P:  Diuresis as b/p allows   RENAL Lab Results  Component Value Date   CREATININE 2.35 (H) 08/01/2018   CREATININE 2.24  (H) 07/31/2018   CREATININE 2.74 (H) 07/31/2018   CREATININE 0.84 10/22/2012   Recent Labs  Lab 07/31/18 2201 07/31/18 2208 08/01/18 0446  K 3.2* 3.1* 2.9*   Recent Labs  Lab 07/31/18 2201 07/31/18 2208 08/01/18 9233  NA 140 140 138  ' A:   AKI - rising sr cr, borderline oliguric>CRRT  Hyperkalemia resolved AGMA - in setting of renal failure, DKA>resolved [place] no acute abnormalities Hypokalemia   P:   Day 5 of CVVH Adjustments per nephrology Replace K+ 2meq x 1  Decrease free water 100 q6    GASTROINTESTINAL A:   Morbid Obesity  Vomiting episodes 07/29/2018, Abd film 8/2 nad . Post pyloric cortrak 8/2  Failed bedside swallow eval 8/4  P:   PPI. Hold TF for now, vomited early 8/4 , resume trickle feeds if does not pass swallow eval in am  Cont reglan, end date in place   HEMATOLOGIC  A:   Elevated hematocrit secondary to dehydration>> resolved Anemia Thrombocytopenia -plt improving 64>80  HIT 8/3 pending  P:  Trend CBC  Repeat INR in am    INFECTIOUS A:   Temp spike to 101.8 on 7/30 early am Pro calcitonin bump 7/30 to 6.65 P:   MSSA sputum Continue Ancef.  Stop date in place  ENDOCRINE CBG (last 3)  Recent Labs    07/31/18 2331 08/01/18 0342 08/01/18 0739  GLUCAP 137* 178* 199*    A:   DKA-resolved  Goiter, exophthalmos Hypothyroidism - TSH 6.379 on admit P:   Continue Synthroid SSI  levemir    NEUROLOGIC A:   Acute Metabolic Encephalopathy - AKI, DKA, ? Post ictal>improved  Seizure- reported on admit, no hx of  CT head is unremarkable. Schizophrenia on Cogentin, klonopin , depakote , invega , trazodone ambien , risperidone at home  P:   Home meds on hold for now, slowly restart Haldol As needed   Ativan As needed      FAMILY  - Updates: 08/01/2018 no family at bedside, pt updated   - Inter-disciplinary family meet or Palliative Care meeting due by:  8/5    Giles Currie NP-C  [facility]   [phone]   08/01/2018, 7:50 AM

## 2018-08-01 NOTE — Evaluation (Signed)
Clinical/Bedside Swallow Evaluation Patient Details  Name: [name] MRN: [id number] Date of Birth: 27-Feb-1971  Today's Date: 08/01/2018 Time: SLP Start Time (ACUTE ONLY): 0758 SLP Stop Time (ACUTE ONLY): 0810 SLP Time Calculation (min) (ACUTE ONLY): 12 min  Past Medical History:  Past Medical History:  Diagnosis Date  . Anemia   . 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 Delusional disorder ([place])   . DVT (deep venous thrombosis) ([place]) 09/30/12   in upper extremities b/l and LLE  . Fibroid tumor   . Graves disease   . 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 Arnoldo Morale on 12/13/2012.  Marland Kitchen Hypertension   . Lupus anticoagulant positive 10/02/2012  . Multinodular goiter 10/01/2012  . Paranoid schizophrenia ([place])   . Thyroid disease   . Thyroid nodule    s/p biopsy   Past Surgical History:  Past Surgical History:  Procedure Laterality Date  . ABDOMINAL HYSTERECTOMY    . HEMATOMA EVACUATION  10/03/2012   Procedure: EVACUATION HEMATOMA;  Surgeon: Jonnie Kind, MD;  Location: [facility];  Service: Gynecology;  Laterality: N/A;  . SUPRACERVICAL ABDOMINAL HYSTERECTOMY  09/27/2012   Procedure: HYSTERECTOMY SUPRACERVICAL ABDOMINAL;  Surgeon: Jonnie Kind, MD;  Location: [facility];  Service: Gynecology;  Laterality: N/A;  . THYROIDECTOMY  12/13/2012   Procedure: THYROIDECTOMY;  Surgeon: Jamesetta So, MD;  Location: [facility];  Service: General;  Laterality: Bilateral;   HPI:  47 year old with history of hypertension, lupus, goiter, paranoid schizophrenia, diabetes went to [facility] ED with altered mental status.  EMS called by family as she was sick for 3 days and lying on the couch.  Patient had seizure activity with EMS and given 5 mg of Versed.  She was found to have hypoglycemia with blood sugars of 1556, anion gap acidosis, elevated lactic acid. Intubated for altered mental status in the ED and transferred to [facility].  As per the  family she did not have any fevers, nausea, vomiting, diarrhea, pain prior to this.  They are not aware that she has diabetes but she does have metformin on her med list.  There are no prior history of seizures.  She had not been taking any of her other medications except psychiatric medications for the past 2 to 3 months.  Family has noticed that she was having excessive urination over the past few weeks.  Most recent chest xray is showing interval clearing in the left lung base.  CT head was showing normal appearance of the brain.     Assessment / Plan / Recommendation Clinical Impression  Clinical swallowing evaluation was completed using ice chips only.  Patient was intubated for 8 days following admission for DKA.  Nursing reporting coughing at baselnie with clear to thick, yellow secretions.  Patient attempted to follow all commands and per nurse was oriented to at least person and place.  Oral mechanism exam was completed and remarkable for right sided facial droop at rest that was not seen during movement.  Symmetry was seen during labial retraction.  Lingual deviation to the right was seen during all movements.  Labial and lingual weakness were also noted.  Volitional cough was weak and very breathy.  Suspect limited if any vocal fold closure.  Patient able to swallow on command with hyo-larymgeal movement appreciated to palpation.  Given intake of small ice chips patient observed to have very strong cough response with additional expectoration of secretions.  Given  clinical presentation, intubation x 8 days and poor vocal quality recommend that the patient remain NPO.  ST will follow up to assess for PO readiness as well as need for intrumental exam to fully assess swallow physiology.   SLP Visit Diagnosis: Dysphagia, oropharyngeal phase (R13.12)    Aspiration Risk  Severe aspiration risk    Diet Recommendation   NPO  Medication Administration: Via alternative means    Other  Recommendations  Oral Care Recommendations: Oral care QID   Follow up Recommendations (Pt will most likely need ST at next level of care.  )      Frequency and Duration min 2x/week  2 weeks       Prognosis Prognosis for Safe Diet Advancement: Good      Swallow Study   General Date of Onset: 07/24/18 HPI: 47 year old with history of hypertension, lupus, goiter, paranoid schizophrenia, diabetes went to [facility] ED with altered mental status.  EMS called by family as she was sick for 3 days and lying on the couch.  Patient had seizure activity with EMS and given 5 mg of Versed.  She was found to have hypoglycemia with blood sugars of 1556, anion gap acidosis, elevated lactic acid. Intubated for altered mental status in the ED and transferred to [facility].  As per the family she did not have any fevers, nausea, vomiting, diarrhea, pain prior to this.  They are not aware that she has diabetes but she does have metformin on her med list.  There are no prior history of seizures.  She had not been taking any of her other medications except psychiatric medications for the past 2 to 3 months.  Family has noticed that she was having excessive urination over the past few weeks.  Most recent chest xray is showing interval clearing in the left lung base.  CT head was showing normal appearance of the brain.   Type of Study: Bedside Swallow Evaluation Previous Swallow Assessment: None noted at [facility].   Diet Prior to this Study: NPO Temperature Spikes Noted: No Respiratory Status: Nasal cannula History of Recent Intubation: Yes Length of Intubations (days): 8 days Date extubated: 07/31/18 Behavior/Cognition: Alert;Cooperative Oral Cavity Assessment: Within Functional Limits Oral Care Completed by SLP: No Self-Feeding Abilities: Total assist Patient Positioning: Upright in bed Baseline Vocal Quality: Aphonic Volitional Cough: Other (Comment)(breathy) Volitional Swallow: Able to elicit    Oral/Motor/Sensory  Function Overall Oral Motor/Sensory Function: Mild impairment Facial ROM: Reduced right Facial Symmetry: Abnormal symmetry right Facial Strength: Reduced right Lingual ROM: Reduced right Lingual Symmetry: Abnormal symmetry right Lingual Strength: Reduced Mandible: Within Functional Limits   Ice Chips Ice chips: Impaired Presentation: Spoon Pharyngeal Phase Impairments: Suspected delayed Swallow;Decreased hyoid-laryngeal movement;Cough - Immediate   Thin Liquid Thin Liquid: Not tested    Nectar Thick Nectar Thick Liquid: Not tested   Honey Thick Honey Thick Liquid: Not tested   Puree Puree: Not tested   Solid     Solid: Not tested      Shelly Flatten, MA, CCC-SLP Acute Rehab SLP [phone]  Lamar Sprinkles 08/01/2018,8:21 AM

## 2018-08-01 NOTE — Progress Notes (Signed)
[facility] ROUNDING NOTE   Subjective:   Interval History: Extubated yesterday.  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  UOP 439mL yesterday, only 82mL this AM so far. Per I/Os she's +16L this admission. K and phos supplemented this AM.   Objective:  Vital signs in last 24 hours:  Temp:  [96.5 F (35.8 C)-98.4 F (36.9 C)] 98.4 F (36.9 C) (08/04 0727) Pulse Rate:  [54-111] 96 (08/04 0700) Resp:  [16-35] 18 (08/04 0700) BP: (86-115)/(39-86) 89/51 (08/04 0700) SpO2:  [92 %-100 %] 98 % (08/04 0700) FiO2 (%):  [40 %] 40 % (08/03 1137) Weight:  [136.9 kg (301 lb 13 oz)] 136.9 kg (301 lb 13 oz) (08/04 0450)  Weight change: -2.7 kg (-5 lb 15.2 oz) Filed Weights   07/30/18 0500 07/31/18 0500 08/01/18 0450  Weight: (!) 140 kg (308 lb 10.3 oz) (!) 139.6 kg (307 lb 12.2 oz) (!) 136.9 kg (301 lb 13 oz)    Intake/Output: I/O last 3 completed shifts: In: 4453.7 [I.V.:2949; Other:185; NG/GT:957.7; IV Piggyback:362] Out: 6553 [Urine:1425; Other:4376]   Intake/Output this shift:  Total I/O In: 320.1 [I.V.:70.1; NG/GT:250] Out: 200 [Urine:20; Other:180]  General - extubated to [place], lying at 45 degrees in bed, nontoxic appearing. CVS- RRR, no mrg  RS- 100% on 2L [place], normal WOB, clear ant  ABD- BS present soft non-distended EXT- trace edema   Basic Metabolic Panel: Recent Labs  Lab 07/28/18 0511 07/28/18 1623 07/29/18 0450  07/30/18 0600 07/30/18 1709 07/31/18 0413 07/31/18 1719 07/31/18 2201 07/31/18 2208 08/01/18 0446  NA 147* 145 143   < > 141 141 139 141 140 140 138  K 4.0 3.9 3.6   < > 3.7 3.7 3.5 3.5 3.2* 3.1* 2.9*  CL 117* 115* 110   < > 104 107 105 102  --   --  93*  CO2 17* 16* 22   < > 24 26 25 28   --   --  32  GLUCOSE 378* 292* 171*   < > 146* 97 165* 125*  --   --  203*  BUN 115* 105* 67*   < > 33* 26* 23* 20  --   --  17  CREATININE 12.54* 11.16* 7.52*   < > 4.23*  3.59* 2.74* 2.24*  --   --  2.35*  CALCIUM 6.5* 6.2* 7.1*   < > 7.8* 7.5* 7.9* 8.6*  --   --  8.5*  MG 2.0 1.9 2.0  --  2.4  --  2.4  --   --   --   --   PHOS 4.8* 4.5  4.4 4.0   < > 3.7 3.1 3.2 3.0  --   --  2.9   < > = values in this interval not displayed.    Liver Function Tests: Recent Labs  Lab 07/26/18 0402 07/28/18 1623 07/29/18 1646 07/30/18 1709 07/31/18 1719 08/01/18 0446  AST 27  --   --   --   --   --   ALT 32  --   --   --   --   --   ALKPHOS 96  --   --   --   --   --   BILITOT 0.7  --   --   --   --   --   PROT 6.0*  --   --   --   --   --  ALBUMIN 2.2* 1.5* 1.6* 1.3* 1.5* 1.5*   No results for input(s): LIPASE, AMYLASE in the last 168 hours. No results for input(s): AMMONIA in the last 168 hours.  CBC: Recent Labs  Lab 07/26/18 0722 07/27/18 0547 07/28/18 0511 07/31/18 0806 07/31/18 2201 07/31/18 2208 08/01/18 0446  WBC 8.2 7.3 5.6 8.6  --   --  10.2  HGB 12.5 10.6* 10.8* 9.2* 10.2* 9.9* 8.7*  HCT 41.3 35.5* 35.0* 29.8* 30.0* 29.0* 27.6*  MCV 88.2 89.0 86.2 85.9  --   --  83.1  PLT 90* 86* 73* 64*  --   --  80*    Cardiac Enzymes: No results for input(s): CKTOTAL, CKMB, CKMBINDEX, TROPONINI in the last 168 hours.  BNP: Invalid input(s): POCBNP  CBG: Recent Labs  Lab 07/31/18 1957 07/31/18 2031 07/31/18 2331 08/01/18 0342 08/01/18 0739  GLUCAP 61* 92 137* 178* 199*    Microbiology: Results for orders placed or performed during the hospital encounter of 07/24/18  Urine culture     Status: None   Collection Time: 07/24/18 10:20 AM  Result Value Ref Range Status   Specimen Description   Final    URINE, CLEAN CATCH Performed at [facility], [address], [place], [place] [address]    Special Requests   Final    NONE Performed at [facility], [address], [place], [place] [address]    Culture   Final    NO GROWTH Performed at [facility], [place] [address] [place], [place] [address]    Report Status 07/25/2018 FINAL   Final  Culture, blood (routine x 2)     Status: None   Collection Time: 07/24/18  4:49 PM  Result Value Ref Range Status   Specimen Description BLOOD LEFT FOOT  Final   Special Requests   Final    BOTTLES DRAWN AEROBIC ONLY Blood Culture results may not be optimal due to an inadequate volume of blood received in culture bottles   Culture   Final    NO GROWTH 5 DAYS Performed at [facility], [place] [address], [place], [place] [address]    Report Status 07/29/2018 FINAL  Final  Urine culture     Status: None   Collection Time: 07/24/18  6:54 PM  Result Value Ref Range Status   Specimen Description URINE, RANDOM  Final   Special Requests NONE  Final   Culture   Final    NO GROWTH Performed at [facility], [place] [address] [place], [place] [address]    Report Status 07/25/2018 FINAL  Final  MRSA PCR Screening     Status: None   Collection Time: 07/24/18  9:03 PM  Result Value Ref Range Status   MRSA by PCR NEGATIVE NEGATIVE Final    Comment:        The GeneXpert MRSA Assay (FDA approved for NASAL specimens only), is one component of a comprehensive MRSA colonization surveillance program. It is not intended to diagnose MRSA infection nor to guide or monitor treatment for MRSA infections. Performed at [facility], [place] [address], [place], [place] [address]   Culture, blood (routine x 2)     Status: None   Collection Time: 07/25/18  5:18 AM  Result Value Ref Range Status   Specimen Description BLOOD LEFT HAND  Final   Special Requests   Final    BOTTLES DRAWN AEROBIC ONLY Blood Culture results may not be optimal due to an inadequate  volume of blood received in culture bottles   Culture   Final    NO GROWTH 5 DAYS Performed at [facility], [place] [address], [place], [place] [address]    Report Status 07/30/2018 FINAL  Final  Urine Culture     Status: Abnormal   Collection Time: 07/27/18  4:03 AM  Result Value Ref Range Status   Specimen Description  URINE, CATHETERIZED  Final   Special Requests Normal  Final   Culture (A)  Final    >=100,000 COLONIES/mL LACTOBACILLUS SPECIES Standardized susceptibility testing for this organism is not available. Performed at [facility], [place] [address], [place], [place] [address]    Report Status 07/28/2018 FINAL  Final  Culture, blood (routine x 2)     Status: None (Preliminary result)   Collection Time: 07/27/18  5:25 AM  Result Value Ref Range Status   Specimen Description BLOOD RIGHT HAND  Final   Special Requests IN PEDIATRIC BOTTLE Blood Culture adequate volume  Final   Culture   Final    NO GROWTH 4 DAYS Performed at [facility], [place] [address] [place], [place] [address]    Report Status PENDING  Incomplete  Culture, blood (routine x 2)     Status: None (Preliminary result)   Collection Time: 07/27/18  5:45 AM  Result Value Ref Range Status   Specimen Description BLOOD RIGHT HAND  Final   Special Requests   Final    BOTTLES DRAWN AEROBIC ONLY Blood Culture adequate volume   Culture   Final    NO GROWTH 4 DAYS Performed at [facility], [place] [address], [place], [place] [address]    Report Status PENDING  Incomplete  Culture, respiratory (non-expectorated)     Status: None   Collection Time: 07/27/18 11:35 AM  Result Value Ref Range Status   Specimen Description TRACHEAL ASPIRATE  Final   Special Requests NONE  Final   Gram Stain   Final    ABUNDANT DEGENERATED CELLULAR MATERIAL PRESENT ABUNDANT GRAM POSITIVE COCCI IN PAIRS MODERATE GRAM VARIABLE ROD Performed at [facility], [place] [address] [place], [place] [address]    Culture ABUNDANT STAPHYLOCOCCUS AUREUS  Final   Report Status 07/30/2018 FINAL  Final   Organism ID, Bacteria STAPHYLOCOCCUS AUREUS  Final      Susceptibility   Staphylococcus aureus - MIC*    CIPROFLOXACIN 1 SENSITIVE Sensitive     ERYTHROMYCIN >=8 RESISTANT Resistant     GENTAMICIN <=0.5 SENSITIVE Sensitive     OXACILLIN 0.5  SENSITIVE Sensitive     TETRACYCLINE <=1 SENSITIVE Sensitive     VANCOMYCIN 1 SENSITIVE Sensitive     TRIMETH/SULFA <=10 SENSITIVE Sensitive     CLINDAMYCIN RESISTANT Resistant     RIFAMPIN <=0.5 SENSITIVE Sensitive     Inducible Clindamycin POSITIVE Resistant     * ABUNDANT STAPHYLOCOCCUS AUREUS    Coagulation Studies: Recent Labs    07/31/18 0806  LABPROT 15.1  INR 1.20    Urinalysis: No results for input(s): COLORURINE, LABSPEC, PHURINE, GLUCOSEU, HGBUR, BILIRUBINUR, KETONESUR, PROTEINUR, UROBILINOGEN, NITRITE, LEUKOCYTESUR in the last 72 hours.  Invalid input(s): APPERANCEUR    Imaging: Dg Chest Port 1 View  Result Date: 08/01/2018 CLINICAL DATA:  Productive cough EXAM: PORTABLE CHEST 1 VIEW COMPARISON:  07/31/2018 FINDINGS: Cardiac shadow is stable. Right jugular central line and feeding catheter are again noted. Nasogastric catheter and endotracheal tube have been removed in the interval. Postsurgical changes in  the lower neck are seen. The lungs are clear. IMPRESSION: Interval clearing in the left base. Electronically Signed   By: Inez Catalina M.D.   On: 08/01/2018 07:55   Dg Chest Port 1 View  Result Date: 07/31/2018 CLINICAL DATA:  Respiratory failure EXAM: PORTABLE CHEST 1 VIEW COMPARISON:  07/30/2018 FINDINGS: Cardiac shadow is stable. Endotracheal tube, nasogastric catheter and right jugular central line are again seen and stable. Feeding catheter is noted as well. The lungs are well aerated bilaterally. Minimal left basilar atelectasis is seen. No bony abnormality is noted. IMPRESSION: Tubes and lines as described. Mild left basilar atelectasis is noted. Electronically Signed   By: Inez Catalina M.D.   On: 07/31/2018 07:48   Dg Abd Portable 1v  Result Date: 07/30/2018 CLINICAL DATA:  Bedside feeding tube placement. EXAM: PORTABLE ABDOMEN - 1 VIEW 5:49 p.m.: COMPARISON:  Portable abdomen x-ray earlier same day 2:52 p.m. and 8:40 a.m. FINDINGS: The feeding tube which was  previously looped in the stomach now likely has its tip in the fourth portion of the duodenum. The nasogastric tube tip is now in the expected location of the duodenal bulb as it is directed inferiorly. Visualized bowel gas pattern unremarkable. IMPRESSION: 1. Feeding tube tip now likely in the fourth portion of the duodenum. 2. Nasogastric tube tip now likely in the duodenal bulb. Electronically Signed   By: Evangeline Dakin M.D.   On: 07/30/2018 18:06   Dg Abd Portable 1v  Result Date: 07/30/2018 CLINICAL DATA:  Feeding tube placement EXAM: PORTABLE ABDOMEN - 1 VIEW COMPARISON:  07/30/2018 FINDINGS: NG tube extends into the gastric antrum. Interval placement of feeding tube which extends into the stomach, loops in the gastric antrum, and tip positioned in the gastric body. IMPRESSION: Introduction feeding tube with tip in the gastric body. NG tube unchanged. Electronically Signed   By: Suzy Bouchard M.D.   On: 07/30/2018 15:07     Medications:   . sodium chloride Stopped (07/31/18 1531)  . sodium chloride 20 mL/hr at 08/01/18 0800  . anticoagulant sodium citrate    . calcium gluconate infusion for CRRT 50 mL/hr at 08/01/18 0800  .  ceFAZolin (ANCEF) IV Stopped (07/31/18 2234)  . dialysis replacement fluid (prismasate) 500 mL/hr at 07/31/18 0943  . dialysis replacement fluid (prismasate) 500 mL/hr at 08/01/18 0815  . dialysate (PRISMASATE) 1,500 mL/hr at 08/01/18 0815  . sodium chloride 999 mL/hr at 07/30/18 2031  . sodium citrate 2 %/dextrose 2.5% solution 3000 mL 370 mL/hr at 08/01/18 0629   . chlorhexidine  15 mL Mouth Rinse BID  . dexamethasone  4 mg Intravenous Q6H  . free water  250 mL Per Tube Q4H  . Gerhardt's butt cream   Topical BID  . insulin aspart  0-20 Units Subcutaneous Q4H  . insulin aspart  7 Units Subcutaneous Q4H  . insulin detemir  20 Units Subcutaneous Q12H  . levothyroxine  125 mcg Per Tube QAC breakfast  . mouth rinse  15 mL Mouth Rinse q12n4p  . pantoprazole  sodium  40 mg Per Tube Daily  . potassium chloride  40 mEq Per Tube Once  . Racepinephrine HCl  0.5 mL Nebulization Once   sodium chloride, Place/Maintain arterial line **AND** sodium chloride, acetaminophen (TYLENOL) oral liquid 160 mg/5 mL, anticoagulant sodium citrate, dextrose, fentaNYL (SUBLIMAZE) injection, haloperidol lactate, ipratropium-albuterol, LORazepam, ondansetron (ZOFRAN) IV, sodium chloride  Assessment/ Plan:  Patient is a47 y.o.femalewith a PMHx of uncontrolled T2DM, hypothyroidisms/p RIA due to Grave's disease, HTN,schizophrenia,  and medication noncompliance x 2-3 monthswho was admitted [facility] 7[facility]2019for evaluationacute metabolic encephalopathy and was found to be in DKA and acute renal failure.  1. Severe oliguric AKI: Likely secondary to severe dehydration in the setting of DKA and ATN from transient hypotension after initiation of propofol for sedation as well as possible NSAID and ACEi use at home.Could also have a component of CKD. Baseline Cr unclear but last Cr 1.2 in 06/2015.Started CRRT for volume overload and oliguria 7/31, BUN ~100.   --She is off vasopressors currently and if filter clots again can reconsider iHD for CRRT but at the moment her MAPs are hanging in the high 50-60s and I would favor CRRT to facilitate UF. --UOP has been moderate recently, but less yesterday; if CRRT off would consider trial of IV diuretics if UF desired --Regional citrate for CRRT for now in light of HIT suspicion   2. Electrolyte issues:  --Hypernatremia:  Improved with CRRT and free water 400 mL q4h per OG tube  --Hypokalemia: supplemented today --Hypophosphatemia: supplemented today  3. Thrombocytopenia: HIT evaluation pending, plt up to 80 today.  Citrate for CRRT.   4.  Chronic HTN: on several antihypertensive medications at home including amlodipine 10 mg QD, hydralazine 50mg  TID, HCTZ 25 mg QD AND lisinopril-HCTZ 10-12.5 mgQd, and metoprolol 50 mg BID.  Unclear if she has been taking all of these meds at home. -Holding currently in light of shock.   LOS: 8 Jannifer Hick A 07/28/2018 9:00 AM

## 2018-08-01 NOTE — Procedures (Signed)
Patient evaluated on CRRT and changes were made as appropriate.   Electrolytes evaluated. All OK.  Jannifer Hick MD

## 2018-08-01 NOTE — Procedures (Signed)
Patient evaluated on CRRT and changes were made as appropriate.   Electrolytes evaluated. K and phos supplemented.  Mag in TPN.  Jannifer Hick MD

## 2018-08-01 NOTE — Progress Notes (Signed)
MEDICATION RELATED NOTE   Pharmacy Re:  Home Meds  Assessment: 47yo female with multiple medical problems and unable to provide medication history for pharmacy.  Her family has brought in home medication bottles but fill history indicates nothing has been filled since November 2018 from [place].  We cannot confirm if she is taking medications as prescribed.  Family is unsure of what she takes as well.  Plan:   -  We have placed "fill" history on the medications that we were able to obtain from the bottle list -  I have marked her record complete for now - however, please note that we cannot confirm and notify us should new information become available and we will be happy to update her record.  Rober Minion, PharmD., MS Clinical Pharmacist Pager:  [phone] Thank you for allowing pharmacy to be part of this patients care team. 08/01/2018,7:43 AM   Medications:  Medications Prior to Admission  Medication Sig Dispense Refill Last Dose  . ibuprofen (ADVIL,MOTRIN) 200 MG tablet Take 400 mg by mouth every 6 (six) hours as needed for headache (pain).   week ago  . amLODipine (NORVASC) 10 MG tablet Take 1 tablet (10 mg total) by mouth daily. 30 tablet 0 09/05/2015 at Unknown time  . benztropine (COGENTIN) 0.5 MG tablet Take 1 tablet (0.5 mg total) by mouth 2 (two) times daily. 60 tablet 0 09/05/2015 at Unknown time  . benztropine (COGENTIN) 0.5 MG tablet Take 1 tablet (0.5 mg total) by mouth 2 (two) times daily. 60 tablet 0   . clonazePAM (KLONOPIN) 0.5 MG tablet Take 1 tablet (0.5 mg total) by mouth 2 (two) times daily. 60 tablet 0 09/05/2015 at Unknown time  . divalproex (DEPAKOTE SPRINKLE) 125 MG capsule Take 8 capsules (1,000 mg total) by mouth at bedtime. 240 capsule 0 unknown  . ferrous sulfate 325 (65 FE) MG tablet Take 325 mg by mouth 2 (two) times daily with a meal.   unknown  . hydrALAZINE (APRESOLINE) 50 MG tablet Take 1 tablet (50 mg total) by mouth every 8 (eight) hours. 90 tablet 0  09/05/2015 at Unknown time  . hydrochlorothiazide (HYDRODIURIL) 25 MG tablet Take 1 tablet (25 mg total) by mouth daily. 30 tablet 0 09/05/2015 at Unknown time  . levothyroxine (SYNTHROID) 125 MCG tablet Take 1 tablet (125 mcg total) by mouth daily. 90 tablet 0 09/05/2015 at Unknown time  . lisinopril-hydrochlorothiazide (PRINZIDE,ZESTORETIC) 10-12.5 MG tablet Take 1 tablet by mouth daily.   unknown  . LORazepam (ATIVAN) 0.5 MG tablet Take 1 tablet (0.5 mg total) by mouth every 8 (eight) hours as needed for anxiety. 60 tablet 0   . metFORMIN (GLUCOPHAGE-XR) 500 MG 24 hr tablet Take 1 tablet (500 mg total) by mouth daily with breakfast.  5 unknown  . metoprolol (LOPRESSOR) 50 MG tablet Take 1 tablet (50 mg total) by mouth 2 (two) times daily. 60 tablet 0 09/05/2015 at Unknown time  . paliperidone (INVEGA SUSTENNA) 234 MG/1.5ML SUSP injection Inject 234 mg into the muscle every 28 (twenty-eight) days. 0.9 mL 1   . potassium chloride SA (K-DUR,KLOR-CON) 20 MEQ tablet Take 20 mEq by mouth 2 (two) times daily.   unknown  . risperiDONE (RISPERDAL M-TABS) 3 MG disintegrating tablet Take 1 tablet (3 mg total) by mouth at bedtime. 30 tablet 0   . traZODone (DESYREL) 100 MG tablet Take 1 tablet (100 mg total) by mouth at bedtime. 30 tablet 0 09/05/2015 at Unknown time  . traZODone (DESYREL) 100 MG tablet  Take 1 tablet (100 mg total) by mouth at bedtime. 30 tablet 0   . Vitamin D, Ergocalciferol, (DRISDOL) 50000 units CAPS capsule Take 50,000 Units by mouth every Monday.   unknown  . zolpidem (AMBIEN) 5 MG tablet Take 1 tablet (5 mg total) by mouth at bedtime as needed for sleep. 30 tablet 0

## 2018-08-02 LAB — POCT I-STAT EG7
ACID-BASE EXCESS: 5 mmol/L — AB (ref 0.0–2.0)
ACID-BASE EXCESS: 7 mmol/L — AB (ref 0.0–2.0)
ACID-BASE EXCESS: 12 mmol/L — AB (ref 0.0–2.0)
ACID-BASE EXCESS: 15 mmol/L — AB (ref 0.0–2.0)
Acid-Base Excess: 3 mmol/L — ABNORMAL HIGH (ref 0.0–2.0)
Acid-Base Excess: 5 mmol/L — ABNORMAL HIGH (ref 0.0–2.0)
Acid-Base Excess: 6 mmol/L — ABNORMAL HIGH (ref 0.0–2.0)
Acid-Base Excess: 7 mmol/L — ABNORMAL HIGH (ref 0.0–2.0)
Acid-Base Excess: 10 mmol/L — ABNORMAL HIGH (ref 0.0–2.0)
Acid-Base Excess: 10 mmol/L — ABNORMAL HIGH (ref 0.0–2.0)
BICARBONATE: 26 mmol/L (ref 20.0–28.0)
BICARBONATE: 26.7 mmol/L (ref 20.0–28.0)
BICARBONATE: 28.6 mmol/L — AB (ref 20.0–28.0)
BICARBONATE: 29.8 mmol/L — AB (ref 20.0–28.0)
BICARBONATE: 30 mmol/L — AB (ref 20.0–28.0)
BICARBONATE: 32.1 mmol/L — AB (ref 20.0–28.0)
BICARBONATE: 34.8 mmol/L — AB (ref 20.0–28.0)
Bicarbonate: 30.2 mmol/L — ABNORMAL HIGH (ref 20.0–28.0)
Bicarbonate: 31.6 mmol/L — ABNORMAL HIGH (ref 20.0–28.0)
Bicarbonate: 36 mmol/L — ABNORMAL HIGH (ref 20.0–28.0)
Bicarbonate: 35.6 mmol/L — ABNORMAL HIGH (ref 20.0–28.0)
Bicarbonate: 40.9 mmol/L — ABNORMAL HIGH (ref 20.0–28.0)
CALCIUM ION: 1.15 mmol/L (ref 1.15–1.40)
CALCIUM ION: 1.1 mmol/L — AB (ref 1.15–1.40)
CALCIUM ION: 1.01 mmol/L — AB (ref 1.15–1.40)
CALCIUM ION: 0.56 mmol/L — AB (ref 1.15–1.40)
CALCIUM ION: 1.09 mmol/L — AB (ref 1.15–1.40)
Calcium, Ion: 0.73 mmol/L — CL (ref 1.15–1.40)
Calcium, Ion: 0.66 mmol/L — CL (ref 1.15–1.40)
Calcium, Ion: 1.11 mmol/L — ABNORMAL LOW (ref 1.15–1.40)
Calcium, Ion: 0.63 mmol/L — CL (ref 1.15–1.40)
Calcium, Ion: 0.63 mmol/L — CL (ref 1.15–1.40)
Calcium, Ion: 1.01 mmol/L — ABNORMAL LOW (ref 1.15–1.40)
Calcium, Ion: 0.6 mmol/L — CL (ref 1.15–1.40)
HCT: 31 % — ABNORMAL LOW (ref 36.0–46.0)
HCT: 30 % — ABNORMAL LOW (ref 36.0–46.0)
HCT: 31 % — ABNORMAL LOW (ref 36.0–46.0)
HCT: 29 % — ABNORMAL LOW (ref 36.0–46.0)
HCT: 30 % — ABNORMAL LOW (ref 36.0–46.0)
HCT: 29 % — ABNORMAL LOW (ref 36.0–46.0)
HCT: 33 % — ABNORMAL LOW (ref 36.0–46.0)
HEMATOCRIT: 30 % — AB (ref 36.0–46.0)
HEMATOCRIT: 28 % — AB (ref 36.0–46.0)
HEMATOCRIT: 28 % — AB (ref 36.0–46.0)
HEMATOCRIT: 27 % — AB (ref 36.0–46.0)
HEMATOCRIT: 29 % — AB (ref 36.0–46.0)
HEMOGLOBIN: 9.9 g/dL — AB (ref 12.0–15.0)
HEMOGLOBIN: 9.5 g/dL — AB (ref 12.0–15.0)
HEMOGLOBIN: 9.5 g/dL — AB (ref 12.0–15.0)
HEMOGLOBIN: 9.2 g/dL — AB (ref 12.0–15.0)
HEMOGLOBIN: 9.9 g/dL — AB (ref 12.0–15.0)
Hemoglobin: 10.2 g/dL — ABNORMAL LOW (ref 12.0–15.0)
Hemoglobin: 10.5 g/dL — ABNORMAL LOW (ref 12.0–15.0)
Hemoglobin: 10.2 g/dL — ABNORMAL LOW (ref 12.0–15.0)
Hemoglobin: 10.5 g/dL — ABNORMAL LOW (ref 12.0–15.0)
Hemoglobin: 10.2 g/dL — ABNORMAL LOW (ref 12.0–15.0)
Hemoglobin: 9.9 g/dL — ABNORMAL LOW (ref 12.0–15.0)
Hemoglobin: 11.2 g/dL — ABNORMAL LOW (ref 12.0–15.0)
O2 SAT: 36 %
O2 SAT: 48 %
O2 SAT: 63 %
O2 SAT: 52 %
O2 SAT: 58 %
O2 Saturation: 50 %
O2 Saturation: 40 %
O2 Saturation: 48 %
O2 Saturation: 52 %
O2 Saturation: 66 %
O2 Saturation: 59 %
O2 Saturation: 65 %
PCO2 VEN: 45.1 mmHg (ref 44.0–60.0)
PCO2 VEN: 46 mmHg (ref 44.0–60.0)
PCO2 VEN: 41.5 mmHg — AB (ref 44.0–60.0)
PCO2 VEN: 43 mmHg — AB (ref 44.0–60.0)
PCO2 VEN: 45.2 mmHg (ref 44.0–60.0)
PH VEN: 7.446 — AB (ref 7.250–7.430)
PH VEN: 7.457 — AB (ref 7.250–7.430)
PH VEN: 7.451 — AB (ref 7.250–7.430)
PH VEN: 7.475 — AB (ref 7.250–7.430)
PO2 VEN: 23 mmHg — AB (ref 32.0–45.0)
PO2 VEN: 23 mmHg — AB (ref 32.0–45.0)
PO2 VEN: 25 mmHg — AB (ref 32.0–45.0)
PO2 VEN: 23 mmHg — AB (ref 32.0–45.0)
PO2 VEN: 32 mmHg (ref 32.0–45.0)
PO2 VEN: 29 mmHg — AB (ref 32.0–45.0)
PO2 VEN: 27 mmHg — AB (ref 32.0–45.0)
POTASSIUM: 3.4 mmol/L — AB (ref 3.5–5.1)
POTASSIUM: 2.9 mmol/L — AB (ref 3.5–5.1)
POTASSIUM: 3.1 mmol/L — AB (ref 3.5–5.1)
POTASSIUM: 3.1 mmol/L — AB (ref 3.5–5.1)
Patient temperature: 97
Patient temperature: 96.5
Patient temperature: 98
Patient temperature: 98
Patient temperature: 98
Patient temperature: 98.5
Potassium: 3.6 mmol/L (ref 3.5–5.1)
Potassium: 3.2 mmol/L — ABNORMAL LOW (ref 3.5–5.1)
Potassium: 3.2 mmol/L — ABNORMAL LOW (ref 3.5–5.1)
Potassium: 2.9 mmol/L — ABNORMAL LOW (ref 3.5–5.1)
Potassium: 3.2 mmol/L — ABNORMAL LOW (ref 3.5–5.1)
Potassium: 3.2 mmol/L — ABNORMAL LOW (ref 3.5–5.1)
Potassium: 3 mmol/L — ABNORMAL LOW (ref 3.5–5.1)
Potassium: 3.3 mmol/L — ABNORMAL LOW (ref 3.5–5.1)
SODIUM: 140 mmol/L (ref 135–145)
SODIUM: 140 mmol/L (ref 135–145)
SODIUM: 140 mmol/L (ref 135–145)
SODIUM: 139 mmol/L (ref 135–145)
SODIUM: 139 mmol/L (ref 135–145)
Sodium: 138 mmol/L (ref 135–145)
Sodium: 140 mmol/L (ref 135–145)
Sodium: 141 mmol/L (ref 135–145)
Sodium: 140 mmol/L (ref 135–145)
Sodium: 139 mmol/L (ref 135–145)
Sodium: 139 mmol/L (ref 135–145)
Sodium: 139 mmol/L (ref 135–145)
TCO2: 27 mmol/L (ref 22–32)
TCO2: 28 mmol/L (ref 22–32)
TCO2: 30 mmol/L (ref 22–32)
TCO2: 31 mmol/L (ref 22–32)
TCO2: 31 mmol/L (ref 22–32)
TCO2: 31 mmol/L (ref 22–32)
TCO2: 33 mmol/L — AB (ref 22–32)
TCO2: 33 mmol/L — ABNORMAL HIGH (ref 22–32)
TCO2: 36 mmol/L — ABNORMAL HIGH (ref 22–32)
TCO2: 37 mmol/L — ABNORMAL HIGH (ref 22–32)
TCO2: 37 mmol/L — ABNORMAL HIGH (ref 22–32)
TCO2: 43 mmol/L — ABNORMAL HIGH (ref 22–32)
pCO2, Ven: 51.1 mmHg (ref 44.0–60.0)
pCO2, Ven: 43.2 mmHg — ABNORMAL LOW (ref 44.0–60.0)
pCO2, Ven: 42.7 mmHg — ABNORMAL LOW (ref 44.0–60.0)
pCO2, Ven: 48.6 mmHg (ref 44.0–60.0)
pCO2, Ven: 46 mmHg (ref 44.0–60.0)
pCO2, Ven: 51.1 mmHg (ref 44.0–60.0)
pCO2, Ven: 55.5 mmHg (ref 44.0–60.0)
pH, Ven: 7.365 (ref 7.250–7.430)
pH, Ven: 7.326 (ref 7.250–7.430)
pH, Ven: 7.402 (ref 7.250–7.430)
pH, Ven: 7.447 — ABNORMAL HIGH (ref 7.250–7.430)
pH, Ven: 7.47 — ABNORMAL HIGH (ref 7.250–7.430)
pH, Ven: 7.476 — ABNORMAL HIGH (ref 7.250–7.430)
pH, Ven: 7.462 — ABNORMAL HIGH (ref 7.250–7.430)
pH, Ven: 7.5 — ABNORMAL HIGH (ref 7.250–7.430)
pO2, Ven: 27 mmHg — CL (ref 32.0–45.0)
pO2, Ven: 26 mmHg — CL (ref 32.0–45.0)
pO2, Ven: 31 mmHg — CL (ref 32.0–45.0)
pO2, Ven: 31 mmHg — CL (ref 32.0–45.0)
pO2, Ven: 29 mmHg — CL (ref 32.0–45.0)

## 2018-08-02 LAB — CBC
HEMATOCRIT: 27.7 % — AB (ref 36.0–46.0)
Hemoglobin: 8.7 g/dL — ABNORMAL LOW (ref 12.0–15.0)
MCH: 26.1 pg (ref 26.0–34.0)
MCHC: 31.4 g/dL (ref 30.0–36.0)
MCV: 83.2 fL (ref 78.0–100.0)
Platelets: 117 10*3/uL — ABNORMAL LOW (ref 150–400)
RBC: 3.33 MIL/uL — ABNORMAL LOW (ref 3.87–5.11)
RDW: 15.8 % — AB (ref 11.5–15.5)
WBC: 9.2 10*3/uL (ref 4.0–10.5)

## 2018-08-02 LAB — RENAL FUNCTION PANEL
ANION GAP: 13 (ref 5–15)
Albumin: 1.7 g/dL — ABNORMAL LOW (ref 3.5–5.0)
BUN: 7 mg/dL (ref 6–20)
CALCIUM: 8.9 mg/dL (ref 8.9–10.3)
CO2: 42 mmol/L — ABNORMAL HIGH (ref 22–32)
Chloride: 87 mmol/L — ABNORMAL LOW (ref 98–111)
Creatinine, Ser: 1.97 mg/dL — ABNORMAL HIGH (ref 0.44–1.00)
GFR calc non Af Amer: 29 mL/min — ABNORMAL LOW (ref >60)
GFR, EST AFRICAN AMERICAN: 33 mL/min — AB (ref >60)
GLUCOSE: 292 mg/dL — AB (ref 70–99)
PHOSPHORUS: 2.7 mg/dL (ref 2.5–4.6)
Potassium: 3.1 mmol/L — ABNORMAL LOW (ref 3.5–5.1)
SODIUM: 142 mmol/L (ref 135–145)

## 2018-08-02 LAB — POCT I-STAT, CHEM 8
BUN: 7 mg/dL (ref 6–20)
BUN: 9 mg/dL (ref 6–20)
CALCIUM ION: 0.5 mmol/L — AB (ref 1.15–1.40)
CREATININE: 1.7 mg/dL — AB (ref 0.44–1.00)
Calcium, Ion: 0.99 mmol/L — ABNORMAL LOW (ref 1.15–1.40)
Chloride: 85 mmol/L — ABNORMAL LOW (ref 98–111)
Chloride: 85 mmol/L — ABNORMAL LOW (ref 98–111)
Creatinine, Ser: 1.4 mg/dL — ABNORMAL HIGH (ref 0.44–1.00)
GLUCOSE: 287 mg/dL — AB (ref 70–99)
Glucose, Bld: 309 mg/dL — ABNORMAL HIGH (ref 70–99)
HEMATOCRIT: 27 % — AB (ref 36.0–46.0)
HEMATOCRIT: 28 % — AB (ref 36.0–46.0)
HEMOGLOBIN: 9.2 g/dL — AB (ref 12.0–15.0)
HEMOGLOBIN: 9.5 g/dL — AB (ref 12.0–15.0)
Potassium: 3.1 mmol/L — ABNORMAL LOW (ref 3.5–5.1)
Potassium: 3.1 mmol/L — ABNORMAL LOW (ref 3.5–5.1)
Sodium: 139 mmol/L (ref 135–145)
Sodium: 139 mmol/L (ref 135–145)
TCO2: 37 mmol/L — AB (ref 22–32)
TCO2: 42 mmol/L — AB (ref 22–32)

## 2018-08-02 LAB — GLUCOSE, CAPILLARY
GLUCOSE-CAPILLARY: 255 mg/dL — AB (ref 70–99)
GLUCOSE-CAPILLARY: 238 mg/dL — AB (ref 70–99)
GLUCOSE-CAPILLARY: 148 mg/dL — AB (ref 70–99)
Glucose-Capillary: 234 mg/dL — ABNORMAL HIGH (ref 70–99)
Glucose-Capillary: 267 mg/dL — ABNORMAL HIGH (ref 70–99)
Glucose-Capillary: 333 mg/dL — ABNORMAL HIGH (ref 70–99)

## 2018-08-02 LAB — CALCIUM, IONIZED: CALCIUM, IONIZED, SERUM: 4.5 mg/dL (ref 4.5–5.6)

## 2018-08-02 LAB — MAGNESIUM: Magnesium: 1.7 mg/dL (ref 1.7–2.4)

## 2018-08-02 LAB — PROTIME-INR
INR: 1.14
Prothrombin Time: 14.5 seconds (ref 11.4–15.2)

## 2018-08-02 LAB — APTT: APTT: 35 s (ref 24–36)

## 2018-08-02 MED ORDER — POTASSIUM CHLORIDE 20 MEQ/15ML (10%) PO SOLN
40.0000 meq | Freq: Once | ORAL | Status: AC
  Administered 2018-08-02: 40 meq
  Filled 2018-08-02: qty 30

## 2018-08-02 MED ORDER — PRO-STAT SUGAR FREE PO LIQD
30.0000 mL | Freq: Three times a day (TID) | ORAL | Status: DC
  Administered 2018-08-02: 30 mL
  Filled 2018-08-02: qty 30

## 2018-08-02 MED ORDER — PRO-STAT SUGAR FREE PO LIQD
30.0000 mL | Freq: Two times a day (BID) | ORAL | Status: DC
  Administered 2018-08-02 – 2018-08-06 (×8): 30 mL
  Filled 2018-08-02 (×9): qty 30

## 2018-08-02 MED ORDER — MAGNESIUM SULFATE 2 GM/50ML IV SOLN
2.0000 g | Freq: Once | INTRAVENOUS | Status: AC
  Administered 2018-08-02: 2 g via INTRAVENOUS
  Filled 2018-08-02: qty 50

## 2018-08-02 MED ORDER — DEXTROSE 5 % IV SOLN
Status: DC
  Filled 2018-08-02 (×6): qty 1500

## 2018-08-02 MED ORDER — CLONAZEPAM 0.5 MG PO TABS
0.5000 mg | ORAL_TABLET | Freq: Two times a day (BID) | ORAL | Status: DC | PRN
  Administered 2018-08-03 – 2018-08-05 (×4): 0.5 mg via ORAL
  Filled 2018-08-02 (×4): qty 1

## 2018-08-02 MED ORDER — HEPARIN SODIUM (PORCINE) 1000 UNIT/ML DIALYSIS
1000.0000 [IU] | INTRAMUSCULAR | Status: DC | PRN
  Filled 2018-08-02: qty 6

## 2018-08-02 MED ORDER — VITAL HIGH PROTEIN PO LIQD
1000.0000 mL | ORAL | Status: DC
  Administered 2018-08-02: 1000 mL

## 2018-08-02 MED ORDER — DEXTROSE 5 % IV SOLN
20.0000 g | INTRAVENOUS | Status: DC
  Filled 2018-08-02 (×4): qty 200

## 2018-08-02 MED ORDER — HEPARIN BOLUS VIA INFUSION (CRRT)
1000.0000 [IU] | INTRAVENOUS | Status: DC | PRN
  Filled 2018-08-02: qty 1000

## 2018-08-02 MED ORDER — VITAL 1.5 CAL PO LIQD
1000.0000 mL | ORAL | Status: DC
  Administered 2018-08-02 – 2018-08-05 (×4): 1000 mL
  Filled 2018-08-02 (×9): qty 1000

## 2018-08-02 MED ORDER — RISPERIDONE 1 MG/ML PO SOLN
3.0000 mg | Freq: Every day | ORAL | Status: DC
  Administered 2018-08-02 – 2018-08-10 (×8): 3 mg
  Filled 2018-08-02 (×10): qty 3

## 2018-08-02 MED ORDER — CEFAZOLIN SODIUM-DEXTROSE 1-4 GM/50ML-% IV SOLN
1.0000 g | INTRAVENOUS | Status: DC
  Administered 2018-08-03 – 2018-08-04 (×2): 1 g via INTRAVENOUS
  Filled 2018-08-02 (×3): qty 50

## 2018-08-02 MED ORDER — SODIUM CHLORIDE 0.9 % IJ SOLN
250.0000 [IU]/h | INTRAMUSCULAR | Status: DC
  Filled 2018-08-02: qty 2

## 2018-08-02 MED ORDER — DOCUSATE SODIUM 50 MG/5ML PO LIQD
100.0000 mg | Freq: Every day | ORAL | Status: DC
  Administered 2018-08-02 – 2018-08-06 (×3): 100 mg via ORAL
  Filled 2018-08-02 (×3): qty 10

## 2018-08-02 NOTE — Progress Notes (Addendum)
PULMONARY / CRITICAL CARE MEDICINE   Name: [name] MRN: [id number] DOB: 1971/09/13    ADMISSION DATE:  07/24/2018 CONSULTATION DATE:  07/24/2018  REFERRING MD:  EDP  CHIEF COMPLAINT:  Altered mental status, Elevated BS  HISTORY OF PRESENT ILLNESS:   46 year old with history of hypertension, lupus, goiter, paranoid schizophrenia, diabetes went to [facility] ED with altered mental status.  EMS called by family as she was sick for 3 days and lying on the couch.  Patient had seizure activity with EMS and given 5 mg of Versed. Found to have hypoglycemia with blood sugars of 1556, anion gap acidosis, elevated lactic acid. Intubated for altered mental status in the ED and transferred to [facility]  As per the family she did not have any fevers, nausea, vomiting, diarrhea, pain prior to this.  They are not aware that she has diabetes but she does have metformin on her med list.  There are no prior history of seizures.  She had not been taking any of her other medications except psychiatric medications for the past 2 to 3 months.  Family has noticed that she was having excessive urination over the past few weeks.   SUBJECTIVE:  Improved this morning.  Oral secretions are decreased, stridor has resolved Continues to have significant hoarseness and sore throat. Tolerating  CRRT. Neg 2.5L x 24 , wt tr down , edema impoved Alert following commands, calm  Vomiting resolved. NO BM since admit    VITAL SIGNS: BP (!) 138/119 (BP Location: Right Arm)   Pulse (!) 106   Temp 98 F (36.7 C) (Oral)   Resp (!) 22   Ht 5\' 9"  (1.753 m)   Wt 298 lb 4.5 oz (135.3 kg)   LMP 09/18/2012   SpO2 96%   BMI 44.05 kg/m   HEMODYNAMICS:    VENTILATOR SETTINGS:    INTAKE / OUTPUT: I/O last 3 completed shifts: In: 4036.5 [I.V.:2451.5; Other:185; NG/GT:1100; IV Piggyback:300] Out: 3300 [TMAUQ:3335; Other:5501]  PHYSICAL EXAMINATION: General: Morbidly obese resting in bed alert  HEENT:  Atraumatic normocephalic , moist oral mucosa no stridor noted hoarseness  Neuro: Alert and oriented, follows commands, calm and appropriate  CV: RRR, no M or G PULM: Decreased breath sounds in the bases no increased work of breathing  GI: Soft, obese positive positive bowel sounds, core track  Extremities: Warm and dry, decreased general edema  Skin: Heel ulcer, dressings clean and dry  LABS:  BMET Recent Labs  Lab 08/01/18 0446  08/01/18 1620  08/02/18 0431 08/02/18 0435 08/02/18 0437  NA 138   < > 140   < > 139 142 139  K 2.9*   < > 3.2*   < > 3.1* 3.1* 3.1*  CL 93*  --  89*   < > 85* 87* 85*  CO2 32  --  35*  --   --  42*  --   BUN 17  --  12   < > 7 7 9   CREATININE 2.35*  --  2.15*   < > 1.40* 1.97* 1.70*  GLUCOSE 203*  --  249*   < > 309* 292* 287*   < > = values in this interval not displayed.    Electrolytes Recent Labs  Lab 07/30/18 0600  07/31/18 0413  08/01/18 0446 08/01/18 1620 08/02/18 0435  CALCIUM 7.8*   < > 7.9*   < > 8.5* 8.8* 8.9  MG 2.4  --  2.4  --   --   --  1.7  PHOS 3.7   < > 3.2   < > 2.9 3.0 2.7   < > = values in this interval not displayed.    CBC Recent Labs  Lab 07/31/18 0806  08/01/18 0446  08/02/18 0431 08/02/18 0435 08/02/18 0437  WBC 8.6  --  10.2  --   --  9.2  --   HGB 9.2*   < > 8.7*   < > 9.5* 8.7* 9.2*  HCT 29.8*   < > 27.6*   < > 28.0* 27.7* 27.0*  PLT 64*  --  80*  --   --  117*  --    < > = values in this interval not displayed.    Coag's Recent Labs  Lab 07/31/18 0806 08/01/18 0446 08/02/18 0435  APTT 42* 40* 35  INR 1.20  --  1.14    Sepsis Markers Recent Labs  Lab 07/27/18 0547 07/28/18 0511 07/28/18 0515 07/29/18 0450  LATICACIDVEN  --   --  1.2  --   PROCALCITON 6.65 11.61  --  5.97    ABG No results for input(s): PHART, PCO2ART, PO2ART in the last 168 hours.  Liver Enzymes Recent Labs  Lab 08/01/18 0446 08/01/18 1620 08/02/18 0435  ALBUMIN 1.5* 1.7* 1.7*    Cardiac Enzymes No  results for input(s): TROPONINI, PROBNP in the last 168 hours.  Glucose Recent Labs  Lab 08/01/18 1142 08/01/18 1549 08/01/18 1926 08/01/18 2320 08/02/18 0315 08/02/18 0710  GLUCAP 228* 235* 190* 262* 255* 267*    Imaging No results found.  STUDIES:  Renal US 7/28 >> no acute abnormality  CULTURES: BCx2 7/27 >> NEG  BC 7/28>>NEG  UC 7/27 >> negative Cultures from 02/2018>> greater than 100,000 colonies unidentified organism>> BC x 2 7/30>> NEG  Sputum  7/30>> sa pending SS>> MSSA UC 7/31 >100K lactobacillus   ANTIBIOTICS: Zosyn 7/30>>7/31 Vanc 7/31 x 1 dose till cultures back>> Ancef 7/31>>  SIGNIFICANT EVENTS: 7/27  Admit  7/28 Sedation adjusted to fentanyl drip. No follow commands 7/29 nephrology consulted 07/30/2018 continues to improve  LINES/TUBES: ETT 7/27 >>8/3 07/28/2018 right IJ hemodialysis cath>>  DISCUSSION: 47 year old with history of schizophrenia, goiter diabetes admitted with DKA due to non compliance with meds & AKI.  Progressive rise in serum creatinine with borderline urinary output. New fever 7/30 am. 07/28/2018 renal function continues to worsen she will require a hemodialysis catheter placement. Extubated 8/3, remains on CRRT . Mild post extubation stridor resolved with racemic epi and decadron .  Failed swallow eval 8/4   ASSESSMENT / PLAN:  PULMONARY A: Acute Respiratory Insufficiency - in the setting of acute metabolic encephalopathy -Improved extubated 8/3  L basilar infiltrate-MSSA in sputum >CXR cleared 8/4  Mild Upper airway Stridor 8/4>resolved p racemic epi/decadron ?OSA/OHS -consider OP w/up . May need trial of CPAP At bedtime   P:   O2 to keep sats >90%.   BD As needed    CARDIOVASCULAR A:  Hypotension>improved , off pressors 8/3  lactic acidosis -resolved + 13 L since admission HTN - on norvasc/ace/hydralazine and metoprolol at home -on hold  P:  Cont to monitor    RENAL Lab Results  Component Value Date    CREATININE 1.70 (H) 08/02/2018   CREATININE 1.97 (H) 08/02/2018   CREATININE 1.40 (H) 08/02/2018   CREATININE 0.84 10/22/2012   Recent Labs  Lab 08/02/18 0431 08/02/18 0435 08/02/18 0437  K 3.1* 3.1* 3.1*   Recent Labs  Lab 08/02/18 0431 08/02/18 0435  08/02/18 0437  NA 139 142 139  ' A:   AKI - rising sr cr, borderline oliguric>CRRT  Hyperkalemia resolved AGMA - in setting of renal failure, DKA>resolved [place] no acute abnormalities Hypokalemia  Hypomagnesium  8/4>tolerating CRRT , increased UOP and scr tr down   P:   Day 6 of CVVH Adjustments per nephrology Replace K+ 49meq x 1  Replace Mg+ Cont free water 100 q6    GASTROINTESTINAL A:   Morbid Obesity  Vomiting episodes 07/29/2018, Abd film 8/2 nad . Post pyloric cortrak 8/2  Failed bedside swallow eval 8/4  Constipation  P:   PPI. Resume trickle feeds today .  Cont to evaluate swallow  Finished reglan  Add Stool softner   HEMATOLOGIC  A:   Elevated hematocrit secondary to dehydration>> resolved Anemia Thrombocytopenia -plt improving 64>80 >117  INR improved  HIT 8/3 pending  P:  Trend CBC     INFECTIOUS A:   Temp spike to 101.8 on 7/30 early am Pro calcitonin bump 7/30 to 6.65 MSSA Sputum /LLL infiltrate cleared on CXR  P:   Continue Ancef.  Stop date in place  ENDOCRINE CBG (last 3)  Recent Labs    08/01/18 2320 08/02/18 0315 08/02/18 0710  GLUCAP 262* 255* 267*    A:   DKA-resolved  Goiter, exophthalmos Hypothyroidism - TSH 6.379 on admit DM -BS tr up on decadron . Decadron complete 8/5  P:   Continue Synthroid SSI  novolog coverage  levemir    NEUROLOGIC A:   Acute Metabolic Encephalopathy - AKI, DKA, ? Post ictal>improved  Seizure- reported on admit, no hx of  CT head is unremarkable. Schizophrenia on Cogentin, klonopin , depakote , invega , trazodone ambien  at home  P:   Home meds on hold for now, slowly restart Haldol As needed   Ativan As needed   Cont  Risperdal At bedtime      FAMILY  - Updates: 08/02/2018 no family at bedside, pt updated   - Inter-disciplinary family meet or Palliative Care meeting due by:  8/5    Tammy Parrett NP-C  [facility]  [phone]   08/02/2018, 8:15 AM  Attending Note:  47 year old female with extensive PMH who presents to PCCM in severe DKA with now acute renal failure that is requiring CRRT.  Patient failed swallow evaluation yesterday but stridor has improved since yesterday.  On exam, remains with transmitted upper airway sounds.  I reviewed CXR myself, pulmonary edema noted.  Hold in the ICU given CRRT need.  Pressors as needed for BP support.  Attempt to get as much fluid off as possible via CRRT.  Monitor closely for airway protection.  Restart TF.  SLP to f/u today.  The patient is critically ill with multiple organ systems failure and requires high complexity decision making for assessment and support, frequent evaluation and titration of therapies, application of advanced monitoring technologies and extensive interpretation of multiple databases.   Critical Care Time devoted to patient care services described in this note is  32  Minutes. This time reflects time of care of this signee Dr Jennet Maduro. This critical care time does not reflect procedure time, or teaching time or supervisory time of PA/NP/Med student/Med Resident etc but could involve care discussion time.  Rush Farmer, M.D. [facility]/Critical Care Medicine. Pager: [phone]. After hours pager: [phone].

## 2018-08-02 NOTE — Progress Notes (Signed)
PCCM brief: Renal to stop CRRT . Pt may tranx out of ICU (off pressors/vent ) . Order for [place] .  Tranx to TRH .   Deziree Mokry NP-C  [facility]  [phone]

## 2018-08-02 NOTE — Progress Notes (Signed)
PHARMACY NOTE:  ANTIMICROBIAL RENAL DOSAGE ADJUSTMENT  Current antimicrobial regimen includes a mismatch between antimicrobial dosage and estimated renal function.  As per policy approved by the Pharmacy & Therapeutics and Medical Executive Committees, the antimicrobial dosage will be adjusted accordingly.  Current antimicrobial dosage:  Cefazolin 2g IV every 12 hours   Indication: MSSA pneumonia  Renal Function: CRRT is stopping. No HD for now. Will need to monitor.   Estimated Creatinine Clearance: 59.3 mL/min (A) (by C-G formula based on SCr of 1.7 mg/dL (H)).    Antimicrobial dosage has been changed to:  Cefazolin 1g IV every 24 hours for now. Follow-up renal replacement therapy versus renal recovery for further adjustment.   Additional comments:   Thank you for allowing pharmacy to be a part of this patient's care.  Sloan Leiter, PharmD, BCPS, BCCCP Clinical Pharmacist Clinical phone 08/02/2018 until 3:30PM - #[phone] Please refer to [facility] and [place] for pharmacy numbers 08/02/2018 2:53 PM

## 2018-08-02 NOTE — Progress Notes (Signed)
PT Cancellation Note  Patient Details Name: [name] MRN: [id number] DOB: 1971/06/03   Cancelled Treatment:    Reason Eval/Treat Not Completed: Patient not medically ready. Noted pt still on CVVHD. Will check back for readiness to participate in PT eval as schedule allows.    Einar Crow 08/02/2018, 9:27 AM   Einar Crow, SPT  Student Physical Therapist Acute Rehab [phone]

## 2018-08-02 NOTE — Progress Notes (Signed)
Nutrition Follow-up  DOCUMENTATION CODES:   Obesity unspecified  INTERVENTION:   Tube Feeding:  Change to Vital 1.5 @ 20 ml/hr Titrate by 5 mL q 4 hours until goal rate of 55 ml/hr Goal rate is Vital 1.5 @ 55 ml/hr Pro-Stat 30 mL BID Provides 2180 kcals, 120 g of protein and 1003 mL of free water Meets 100% estimated calorie and protein needs   NUTRITION DIAGNOSIS:   Inadequate oral intake related to inability to eat as evidenced by NPO status.  Being addressed via TF   GOAL:   Provide needs based on ASPEN/SCCM guidelines  Progressing  MONITOR:   I & O's, Weight trends  REASON FOR ASSESSMENT:   Ventilator    ASSESSMENT:   Patient with PMH HTN, lupus, goiter, paranoid schizophrenia, diabetes presented with AMS. Family states she was sick for 3 days and just lying on the couch. Blood sugar upon presentation was 1556, admitted for DKA, AKI.  8/2 Post-pyloric Cortrak-tip in 4th portion of duodenum 8/3 Extubated 8/4 Failed Swallow eval  CRRT to be stopped today Remains NPO, not safe for diet advancement per SLP Vital High Protein infusing at 20 ml/hr; TF off most of the weekend  +large BM today after not having a BM this entire admission  UOP improving, almost 1L in 24 hours  Labs: potassium 3.1, CBGs 190-267 Meds: ss novolog, novolog q 4 hours, levemir   Diet Order:   Diet Order           Diet NPO time specified  Diet effective now          EDUCATION NEEDS:   Not appropriate for education at this time  Skin:  Skin Assessment: Reviewed RN Assessment(MSAD to L breast) Skin Integrity Issues:: Other (Comment) Other: MASD: breast, buttock;  Last BM:  8/5 large type 7  Height:   Ht Readings from Last 1 Encounters:  07/24/18 5\' 9"  (1.753 m)    Weight:   Wt Readings from Last 1 Encounters:  08/02/18 298 lb 4.5 oz (135.3 kg)    Ideal Body Weight:  65.9 kg  BMI:  Body mass index is 44.05 kg/m.  Estimated Nutritional Needs:   Kcal:   2000-2300 kcals   Protein:  100-132 g  Fluid:  >/= 1.7 L   Kerman Passey MS, RD, LDN, CNSC [phone] Pager  [phone] Weekend/On-Call Pager'

## 2018-08-02 NOTE — Progress Notes (Signed)
  Speech Language Pathology Treatment: Dysphagia  Patient Details Name: [name] MRN: [id number] DOB: 1971/03/20 Today's Date: 08/02/2018 Time: 1152-1202 SLP Time Calculation (min) (ACUTE ONLY): 10 min  Assessment / Plan / Recommendation Clinical Impression  Pt is aphonic but with a subjectively strong, productive cough. Single, small ice chips continue to elicit a strong cough response. Min cues provided to try compensatory strategies but pt continues to cough. Recommend remaining NPO with nutrition and meds via alternative means (has cortrak). Will f/u for clinical signs of improvement and/or readiness to participate in FEES.   HPI HPI: 48 year old with history of hypertension, lupus, goiter, paranoid schizophrenia, diabetes went to [facility] ED with altered mental status.  EMS called by family as she was sick for 3 days and lying on the couch.  Patient had seizure activity with EMS and given 5 mg of Versed.  She was found to have hypoglycemia with blood sugars of 1556, anion gap acidosis, elevated lactic acid. Intubated for altered mental status in the ED and transferred to [facility].  As per the family she did not have any fevers, nausea, vomiting, diarrhea, pain prior to this.  They are not aware that she has diabetes but she does have metformin on her med list.  There are no prior history of seizures.  She had not been taking any of her other medications except psychiatric medications for the past 2 to 3 months.  Family has noticed that she was having excessive urination over the past few weeks.  Most recent chest xray is showing interval clearing in the left lung base.  CT head was showing normal appearance of the brain.        SLP Plan  Continue with current plan of care       Recommendations  Diet recommendations: NPO Medication Administration: Via alternative means                Oral Care Recommendations: Oral care QID Follow up Recommendations: (tba) SLP  Visit Diagnosis: Dysphagia, oropharyngeal phase (R13.12) Plan: Continue with current plan of care       GO                Germain Osgood 08/02/2018, 12:33 PM  Germain Osgood, M.A. CCC-SLP [phone]

## 2018-08-02 NOTE — Progress Notes (Signed)
Inpatient Diabetes Program Recommendations  AACE/ADA: New Consensus Statement on Inpatient Glycemic Control (2015)  Target Ranges:  Prepandial:   less than 140 mg/dL      Peak postprandial:   less than 180 mg/dL (1-2 hours)      Critically ill patients:  140 - 180 mg/dL   Lab Results  Component Value Date   GLUCAP 333 (H) 08/02/2018   HGBA1C 16.3 (H) 08/01/2018    Review of Glycemic Control Results for [name], [name] (MRN [id number]) as of 08/02/2018 12:18  Ref. Range 08/01/2018 23:20 08/02/2018 03:15 08/02/2018 07:10 08/02/2018 11:05  Glucose-Capillary Latest Ref Range: 70 - 99 mg/dL 262 (H) 255 (H) 267 (H) 333 (H)   Diabetes history: New onset? No noted mention of DM Outpatient Diabetes medications: none Current orders for Inpatient glycemic control: Novolog 0-20 units Q4H, Levemir 20 units BID, Novolog 7 units Q4H,   Inpatient Diabetes Program Recommendations:    Completed Decadron as 08/02/18, thus anticipating BS to be elevated over course of next 24-48 hours.   Noted tube feeds have resumed. Patient was started on Prostat 30 ml TID and Vital 1.5 cal 55 ml/hr. Already has tube feed coverage of Novolog 7 units Q4H. Spoke with RN to verify that patient will continue to tube coverage regardless of today's swallow study result. Given fluctuations, would continue with current orders and plan for adjustments in the AM.   Plan to speak with patient today regarding diabetes.   Thanks, Bronson Curb, MSN, RNC-OB Diabetes Coordinator [phone] (8a-5p)

## 2018-08-02 NOTE — Progress Notes (Signed)
Per renal will stop CRRT, no need to do ionized calcium post or peripheral. 08/02/2018 12:30 PM Bobby Rumpf, [place] E

## 2018-08-02 NOTE — Procedures (Signed)
Admit: 07/24/2018 LOS: 9  57F with AMS, found to have AKI and severe acidosis related to hyperglycemia  Current CRRT Prescription: Start Date: 8/1 Catheter: R IJ Trialysis placed 8/1 TEMP BFR: 220 Pre Blood Pump: 430 DFR: 1500 Replacement Rate: 500 Anticoagulation: citrate Clotting: none  S: Doing well, off pressures, not intubated NOrmal BPs Increasing UOP Stable labs HIT neg, no further clotting on citrate  O: 08/04 0701 - 08/05 0700 In: 2451.3 [I.V.:1651.3; NG/GT:600; IV Piggyback:200] Out: 5021 [Urine:925]  Filed Weights   07/31/18 0500 08/01/18 0450 08/02/18 0316  Weight: (!) 139.6 kg (307 lb 12.2 oz) (!) 136.9 kg (301 lb 13 oz) 135.3 kg (298 lb 4.5 oz)    Recent Labs  Lab 08/01/18 0446  08/01/18 1620  08/02/18 0431 08/02/18 0435 08/02/18 0437  NA 138   < > 140   < > 139 142 139  K 2.9*   < > 3.2*   < > 3.1* 3.1* 3.1*  CL 93*  --  89*   < > 85* 87* 85*  CO2 32  --  35*  --   --  42*  --   GLUCOSE 203*  --  249*   < > 309* 292* 287*  BUN 17  --  12   < > 7 7 9   CREATININE 2.35*  --  2.15*   < > 1.40* 1.97* 1.70*  CALCIUM 8.5*  --  8.8*  --   --  8.9  --   PHOS 2.9  --  3.0  --   --  2.7  --    < > = values in this interval not displayed.   Recent Labs  Lab 07/31/18 0806  08/01/18 0446  08/02/18 0431 08/02/18 0435 08/02/18 0437  WBC 8.6  --  10.2  --   --  9.2  --   HGB 9.2*   < > 8.7*   < > 9.5* 8.7* 9.2*  HCT 29.8*   < > 27.6*   < > 28.0* 27.7* 27.0*  MCV 85.9  --  83.1  --   --  83.2  --   PLT 64*  --  80*  --   --  117*  --    < > = values in this interval not displayed.    Scheduled Meds: . chlorhexidine  15 mL Mouth Rinse BID  . docusate  100 mg Oral Daily  . feeding supplement (PRO-STAT SUGAR FREE 64)  30 mL Per Tube TID  . free water  100 mL Per Tube Q6H  . Gerhardt's butt cream   Topical BID  . insulin aspart  0-20 Units Subcutaneous Q4H  . insulin aspart  7 Units Subcutaneous Q4H  . insulin detemir  20 Units Subcutaneous Q12H  .  levothyroxine  125 mcg Per Tube QAC breakfast  . mouth rinse  15 mL Mouth Rinse q12n4p  . pantoprazole sodium  40 mg Per Tube Daily  . risperiDONE  3 mg Oral QHS   Continuous Infusions: . sodium chloride Stopped (07/31/18 1531)  . sodium chloride 20 mL/hr at 08/02/18 0700  . anticoagulant sodium citrate    . calcium gluconate infusion for CRRT 50 mL/hr at 08/02/18 1159  .  ceFAZolin (ANCEF) IV 2 g (08/02/18 1057)  . feeding supplement (VITAL 1.5 CAL) 1,000 mL (08/02/18 1210)  . dialysis replacement fluid (prismasate) 500 mL/hr at 07/31/18 0943  . dialysis replacement fluid (prismasate) 500 mL/hr at 08/02/18 0422  . dialysate (PRISMASATE) 1,500  mL/hr at 08/02/18 1104  . sodium chloride 999 mL/hr at 07/30/18 2031  . sodium citrate 2 %/dextrose 2.5% solution 3000 mL 430 mL/hr at 08/02/18 1200   PRN Meds:.sodium chloride, Place/Maintain arterial line **AND** sodium chloride, acetaminophen (TYLENOL) oral liquid 160 mg/5 mL, anticoagulant sodium citrate, dextrose, fentaNYL (SUBLIMAZE) injection, haloperidol lactate, ipratropium-albuterol, LORazepam, ondansetron (ZOFRAN) IV, sodium chloride  ABG    Component Value Date/Time   PHART 7.333 (L) 07/25/2018 0412   PCO2ART 32.8 07/25/2018 0412   PO2ART 85.0 07/25/2018 0412   HCO3 39.6 (H) 08/01/2018 1410   HCO3 40.9 (H) 08/01/2018 1410   TCO2 42 (H) 08/02/2018 0437   ACIDBASEDEF 8.0 (H) 07/25/2018 0412   O2SAT 58.0 08/01/2018 1410   O2SAT 58.0 08/01/2018 1410    A 1. Dialysis dependent AKI, improved, nonoliguric now 2. VDRF, related to AMS, extubated 8/3 3. Hypotension, resolved off pressors 4. Hyperglycemia, improved 5. Schizophrenia  P 1. Much improved. Stop CRRT. Follow labs and UOP. Might not need further HD 2. Daily weights, Daily Renal Panel, Strict I/Os, Avoid nephrotoxins (NSAIDs, judicious IV Contrast)    Pearson Grippe, MD [place] pgr [phone]

## 2018-08-03 DIAGNOSIS — F200 Paranoid schizophrenia: Secondary | ICD-10-CM

## 2018-08-03 DIAGNOSIS — E1165 Type 2 diabetes mellitus with hyperglycemia: Secondary | ICD-10-CM

## 2018-08-03 DIAGNOSIS — E876 Hypokalemia: Secondary | ICD-10-CM

## 2018-08-03 DIAGNOSIS — I10 Essential (primary) hypertension: Secondary | ICD-10-CM

## 2018-08-03 DIAGNOSIS — IMO0002 Reserved for concepts with insufficient information to code with codable children: Secondary | ICD-10-CM

## 2018-08-03 DIAGNOSIS — E039 Hypothyroidism, unspecified: Secondary | ICD-10-CM

## 2018-08-03 DIAGNOSIS — D72829 Elevated white blood cell count, unspecified: Secondary | ICD-10-CM

## 2018-08-03 DIAGNOSIS — M329 Systemic lupus erythematosus, unspecified: Secondary | ICD-10-CM

## 2018-08-03 LAB — CBC
HEMATOCRIT: 27 % — AB (ref 36.0–46.0)
Hemoglobin: 8.4 g/dL — ABNORMAL LOW (ref 12.0–15.0)
MCH: 26.7 pg (ref 26.0–34.0)
MCHC: 31.1 g/dL (ref 30.0–36.0)
MCV: 85.7 fL (ref 78.0–100.0)
PLATELETS: 144 10*3/uL — AB (ref 150–400)
RBC: 3.15 MIL/uL — ABNORMAL LOW (ref 3.87–5.11)
RDW: 15.9 % — ABNORMAL HIGH (ref 11.5–15.5)
WBC: 10.7 10*3/uL — ABNORMAL HIGH (ref 4.0–10.5)

## 2018-08-03 LAB — BASIC METABOLIC PANEL
Anion gap: 10 (ref 5–15)
BUN: 21 mg/dL — ABNORMAL HIGH (ref 6–20)
CHLORIDE: 88 mmol/L — AB (ref 98–111)
CO2: 45 mmol/L — AB (ref 22–32)
CREATININE: 3.33 mg/dL — AB (ref 0.44–1.00)
Calcium: 8.5 mg/dL — ABNORMAL LOW (ref 8.9–10.3)
GFR calc Af Amer: 18 mL/min — ABNORMAL LOW (ref >60)
GFR calc non Af Amer: 15 mL/min — ABNORMAL LOW (ref >60)
Glucose, Bld: 218 mg/dL — ABNORMAL HIGH (ref 70–99)
Potassium: 2.8 mmol/L — ABNORMAL LOW (ref 3.5–5.1)
SODIUM: 143 mmol/L (ref 135–145)

## 2018-08-03 LAB — MAGNESIUM: Magnesium: 2.2 mg/dL (ref 1.7–2.4)

## 2018-08-03 LAB — GLUCOSE, CAPILLARY
GLUCOSE-CAPILLARY: 271 mg/dL — AB (ref 70–99)
GLUCOSE-CAPILLARY: 212 mg/dL — AB (ref 70–99)
GLUCOSE-CAPILLARY: 219 mg/dL — AB (ref 70–99)
GLUCOSE-CAPILLARY: 211 mg/dL — AB (ref 70–99)
Glucose-Capillary: 205 mg/dL — ABNORMAL HIGH (ref 70–99)
Glucose-Capillary: 166 mg/dL — ABNORMAL HIGH (ref 70–99)

## 2018-08-03 LAB — CALCIUM, IONIZED: Calcium, Ionized, Serum: 4.2 mg/dL — ABNORMAL LOW (ref 4.5–5.6)

## 2018-08-03 LAB — PHOSPHORUS: PHOSPHORUS: 4 mg/dL (ref 2.5–4.6)

## 2018-08-03 MED ORDER — ORAL CARE MOUTH RINSE
15.0000 mL | Freq: Two times a day (BID) | OROMUCOSAL | Status: DC
  Administered 2018-08-03 – 2018-08-06 (×5): 15 mL via OROMUCOSAL

## 2018-08-03 MED ORDER — DIVALPROEX SODIUM 125 MG PO CSDR
500.0000 mg | DELAYED_RELEASE_CAPSULE | Freq: Every day | ORAL | Status: DC
  Administered 2018-08-03 – 2018-08-10 (×8): 500 mg via ORAL
  Filled 2018-08-03 (×8): qty 4

## 2018-08-03 MED ORDER — POTASSIUM CHLORIDE 20 MEQ PO PACK
40.0000 meq | PACK | Freq: Once | ORAL | Status: AC
  Administered 2018-08-03: 40 meq via ORAL
  Filled 2018-08-03: qty 2

## 2018-08-03 NOTE — Consult Note (Signed)
Physical Medicine and Rehabilitation Consult Reason for Consult: Decreased functional mobility Referring Physician: [facility]   HPI: [name] is a 47 y.o. right-handed female with history of hypertension, lupus, paranoid schizophrenia, diabetes mellitus.  History taken from chart review and husband. Presented to [facility] 07/24/2018 with altered mental status.  Family had reported she had not been taking her medicines other than her psychiatric medications for the past 2 to 3 months.  EMS reported in route possible seizure.  Found to have hyperglycemia blood sugar 1556, elevated lactic acid.  Patient was transferred to [facility].  Cranial CT reviewed, unremarkable for acute intracranial process. UDS was negative.  Blood cultures no growth.  Hyperkalemia 5.6, creatinine 7.72.  Renal ultrasound negative.  Follow-up renal services AKI related to hyperglycemia and dialysis initiated.  Patient did require intubation for a short time.  Dialysis currently on hold with creatinine improving 3.33.  Currently maintained on ceftezole and for MSSA pneumonia.  Patient is NPO. with nasogastric tube for nutritional support.   Review of Systems  Constitutional: Negative for chills and fever.  Respiratory: Negative for shortness of breath.   Cardiovascular: Negative for chest pain.  Neurological: Negative for weakness.  All other systems reviewed and are negative.  Past Medical History:  Diagnosis Date  . Anemia   . 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 Delusional disorder ([place])   . DVT (deep venous thrombosis) ([place]) 09/30/12   in upper extremities b/l and LLE  . Fibroid tumor   . Graves disease   . 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 Arnoldo Morale on 12/13/2012.  Marland Kitchen Hypertension   . Lupus anticoagulant positive 10/02/2012  . Multinodular goiter 10/01/2012  . Paranoid schizophrenia ([place])   . Thyroid disease   . Thyroid nodule      s/p biopsy   Past Surgical History:  Procedure Laterality Date  . ABDOMINAL HYSTERECTOMY    . HEMATOMA EVACUATION  10/03/2012   Procedure: EVACUATION HEMATOMA;  Surgeon: Jonnie Kind, MD;  Location: [facility];  Service: Gynecology;  Laterality: N/A;  . SUPRACERVICAL ABDOMINAL HYSTERECTOMY  09/27/2012   Procedure: HYSTERECTOMY SUPRACERVICAL ABDOMINAL;  Surgeon: Jonnie Kind, MD;  Location: [facility];  Service: Gynecology;  Laterality: N/A;  . THYROIDECTOMY  12/13/2012   Procedure: THYROIDECTOMY;  Surgeon: Jamesetta So, MD;  Location: [facility];  Service: General;  Laterality: Bilateral;   Family History  Problem Relation Age of Onset  . Diabetes Mother   . Hypertension Mother   . Cancer Father        prostate   . Cancer Sister        pancreatic cancer   Social History:  reports that she has never smoked. She has never used smokeless tobacco. She reports that she does not drink alcohol or use drugs. Allergies: No Known Allergies Medications Prior to Admission  Medication Sig Dispense Refill  . ibuprofen (ADVIL,MOTRIN) 200 MG tablet Take 400 mg by mouth every 6 (six) hours as needed for headache (pain).    Marland Kitchen amLODipine (NORVASC) 10 MG tablet Take 1 tablet (10 mg total) by mouth daily. 30 tablet 0  . benztropine (COGENTIN) 0.5 MG tablet Take 1 tablet (0.5 mg total) by mouth 2 (two) times daily. 60 tablet 0  . benztropine (COGENTIN) 0.5 MG tablet Take 1 tablet (0.5 mg total) by mouth 2 (two) times daily. 60 tablet 0  . clonazePAM (KLONOPIN) 0.5  MG tablet Take 1 tablet (0.5 mg total) by mouth 2 (two) times daily. 60 tablet 0  . divalproex (DEPAKOTE SPRINKLE) 125 MG capsule Take 8 capsules (1,000 mg total) by mouth at bedtime. 240 capsule 0  . ferrous sulfate 325 (65 FE) MG tablet Take 325 mg by mouth 2 (two) times daily with a meal.    . hydrALAZINE (APRESOLINE) 50 MG tablet Take 1 tablet (50 mg total) by mouth every 8 (eight) hours. 90 tablet 0  . hydrochlorothiazide (HYDRODIURIL) 25 MG  tablet Take 1 tablet (25 mg total) by mouth daily. 30 tablet 0  . levothyroxine (SYNTHROID) 125 MCG tablet Take 1 tablet (125 mcg total) by mouth daily. 90 tablet 0  . lisinopril-hydrochlorothiazide (PRINZIDE,ZESTORETIC) 10-12.5 MG tablet Take 1 tablet by mouth daily.    Marland Kitchen LORazepam (ATIVAN) 0.5 MG tablet Take 1 tablet (0.5 mg total) by mouth every 8 (eight) hours as needed for anxiety. 60 tablet 0  . metFORMIN (GLUCOPHAGE-XR) 500 MG 24 hr tablet Take 1 tablet (500 mg total) by mouth daily with breakfast.  5  . metoprolol (LOPRESSOR) 50 MG tablet Take 1 tablet (50 mg total) by mouth 2 (two) times daily. 60 tablet 0  . paliperidone (INVEGA SUSTENNA) 234 MG/1.5ML SUSP injection Inject 234 mg into the muscle every 28 (twenty-eight) days. 0.9 mL 1  . potassium chloride SA (K-DUR,KLOR-CON) 20 MEQ tablet Take 20 mEq by mouth 2 (two) times daily.    . risperiDONE (RISPERDAL M-TABS) 3 MG disintegrating tablet Take 1 tablet (3 mg total) by mouth at bedtime. 30 tablet 0  . traZODone (DESYREL) 100 MG tablet Take 1 tablet (100 mg total) by mouth at bedtime. 30 tablet 0  . traZODone (DESYREL) 100 MG tablet Take 1 tablet (100 mg total) by mouth at bedtime. 30 tablet 0  . Vitamin D, Ergocalciferol, (DRISDOL) 50000 units CAPS capsule Take 50,000 Units by mouth every Monday.    . zolpidem (AMBIEN) 5 MG tablet Take 1 tablet (5 mg total) by mouth at bedtime as needed for sleep. 30 tablet 0    Home: Home Living Family/patient expects to be discharged to:: Private residence Available Help at Discharge: Family, Available 24 hours/day Type of Home: Apartment Home Access: Stairs to enter [employer] of Steps: 20 Entrance Stairs-Rails: Right Home Layout: One level Bathroom Shower/Tub: Tub/shower unit, Architectural technologist: Standard Bathroom Accessibility: Yes Home Equipment: Environmental consultant - 2 wheels, Cane - single point(per chart review. Need to confirm)  Functional History: Prior Function Level of  Independence: Independent Functional Status:  Mobility:   Transfers Overall transfer level: Needs assistance Transfers: Sit to/from Stand Sit to Stand: Min assist, +2 physical assistance General transfer comment: LOB posteriorly when stepping backwards to chair requiring Mod A +2      ADL: ADL Overall ADL's : Needs assistance/impaired Eating/Feeding: NPO Grooming: Minimal assistance, Sitting Upper Body Bathing: Minimal assistance, Sitting Lower Body Bathing: Moderate assistance, Sit to/from stand Upper Body Dressing : Moderate assistance, Sitting Lower Body Dressing: Moderate assistance, Sit to/from stand Lower Body Dressing Details (indicate cue type and reason): able to donn L sock @ bed level Toilet Transfer: Minimal assistance, Cueing for safety, BSC, Ambulation, +2 for safety/equipment, Grab bars Toileting- Clothing Manipulation and Hygiene: Minimal assistance, Sit to/from stand Functional mobility during ADLs: Minimal assistance, +2 for physical assistance, Cueing for safety General ADL Comments: LOB when stepping backward to chair requiring Mod A +2 for safety  Cognition: Cognition Overall Cognitive Status: No family/caregiver present to determine baseline cognitive  functioning Orientation Level: Oriented X4 Cognition Arousal/Alertness: Awake/alert Behavior During Therapy: Flat affect Overall Cognitive Status: No family/caregiver present to determine baseline cognitive functioning General Comments: most likely close to baseline  Blood pressure (!) 75/46, pulse (!) 101, temperature 98 F (36.7 C), temperature source Oral, resp. rate 15, height 5\' 9"  (1.753 m), weight 126.4 kg (278 lb 10.6 oz), last menstrual period 09/18/2012, SpO2 93 %. Physical Exam  Vitals reviewed. Constitutional: She appears well-developed.  Morbidly obese  HENT:  Head: Normocephalic and atraumatic.  + NGT  Eyes: EOM are normal. Right eye exhibits no discharge. Left eye exhibits no discharge.    exophthalmus  Neck: Normal range of motion. Neck supple.  Cardiovascular: Regular rhythm.  + tachycardia  Respiratory: Effort normal and breath sounds normal.  GI: Soft. Bowel sounds are normal.  Musculoskeletal:  No edema or tenderness in extremities  Neurological: She is alert.  Dysphonia Motor: Grossly 4+/5 throughout Sensation intact light touch  Skin: Skin is warm and dry.  Central line in place  Psychiatric: Her affect is blunt.  Dysphonic speech    Results for orders placed or performed during the hospital encounter of 07/24/18 (from the past 24 hour(s))  Glucose, capillary     Status: Abnormal   Collection Time: 08/02/18 11:05 AM  Result Value Ref Range   Glucose-Capillary 333 (H) 70 - 99 mg/dL  Glucose, capillary     Status: Abnormal   Collection Time: 08/02/18  3:20 PM  Result Value Ref Range   Glucose-Capillary 238 (H) 70 - 99 mg/dL  Glucose, capillary     Status: Abnormal   Collection Time: 08/02/18  7:17 PM  Result Value Ref Range   Glucose-Capillary 148 (H) 70 - 99 mg/dL  Glucose, capillary     Status: Abnormal   Collection Time: 08/02/18 11:36 PM  Result Value Ref Range   Glucose-Capillary 234 (H) 70 - 99 mg/dL  Glucose, capillary     Status: Abnormal   Collection Time: 08/03/18  4:02 AM  Result Value Ref Range   Glucose-Capillary 205 (H) 70 - 99 mg/dL  CBC     Status: Abnormal   Collection Time: 08/03/18  4:15 AM  Result Value Ref Range   WBC 10.7 (H) 4.0 - 10.5 K/uL   RBC 3.15 (L) 3.87 - 5.11 MIL/uL   Hemoglobin 8.4 (L) 12.0 - 15.0 g/dL   HCT 27.0 (L) 36.0 - 46.0 %   MCV 85.7 78.0 - 100.0 fL   MCH 26.7 26.0 - 34.0 pg   MCHC 31.1 30.0 - 36.0 g/dL   RDW 15.9 (H) 11.5 - 15.5 %   Platelets 144 (L) 150 - 400 K/uL  Magnesium     Status: None   Collection Time: 08/03/18  4:15 AM  Result Value Ref Range   Magnesium 2.2 1.7 - 2.4 mg/dL  Basic metabolic panel     Status: Abnormal   Collection Time: 08/03/18  4:15 AM  Result Value Ref Range   Sodium  143 135 - 145 mmol/L   Potassium 2.8 (L) 3.5 - 5.1 mmol/L   Chloride 88 (L) 98 - 111 mmol/L   CO2 45 (H) 22 - 32 mmol/L   Glucose, Bld 218 (H) 70 - 99 mg/dL   BUN 21 (H) 6 - 20 mg/dL   Creatinine, Ser 3.33 (H) 0.44 - 1.00 mg/dL   Calcium 8.5 (L) 8.9 - 10.3 mg/dL   GFR calc non Af Amer 15 (L) >60 mL/min   GFR calc Af Wyvonnia Lora  18 (L) >60 mL/min   Anion gap 10 5 - 15  Phosphorus     Status: None   Collection Time: 08/03/18  4:15 AM  Result Value Ref Range   Phosphorus 4.0 2.5 - 4.6 mg/dL  Glucose, capillary     Status: Abnormal   Collection Time: 08/03/18  8:25 AM  Result Value Ref Range   Glucose-Capillary 166 (H) 70 - 99 mg/dL   No results found.  Assessment/Plan: Diagnosis: debility Labs and images (see above) independently reviewed.  Records reviewed and summated above.  1. Does the need for close, 24 hr/day medical supervision in concert with the patient's rehab needs make it unreasonable for this patient to be served in a less intensive setting? Yes  2. Co-Morbidities requiring supervision/potential complications: AKI (avoid nephrotoxic meds), hyperkalemia (continue to monitor, treat if necessary), medication noncompliance (counsel), HTN (monitor and provide prns in accordance with increased physical exertion and pain), lupus (consider restarting medications if necessary), paranoid schizophrenia (cont meds), uncontrolled diabetes mellitus (Monitor in accordance with exercise and adjust meds as necessary), hypokalemia (continue to monitor and replete as necessary), leukocytosis (cont to monitor for signs and symptoms of infection, further workup if indicated), hypothyroidism (cont meds, ensure appropriate mood and energy level for therapies)  3. Due to safety, skin/wound care, disease management and patient education, does the patient require 24 hr/day rehab nursing? Yes 4. Does the patient require coordinated care of a physician, rehab nurse, PT (1-2 hrs/day, 5 days/week), OT (1-2  hrs/day, 5 days/week) and SLP (1-2 hrs/day, 5 days/week) to address physical and functional deficits in the context of the above medical diagnosis(es)? Yes Addressing deficits in the following areas: balance, endurance, locomotion, strength, transferring, bathing, dressing, toileting, cognition, speech and psychosocial support 5. Can the patient actively participate in an intensive therapy program of at least 3 hrs of therapy per day at least 5 days per week? Yes 6. The potential for patient to make measurable gains while on inpatient rehab is excellent 7. Anticipated functional outcomes upon discharge from inpatient rehab are supervision  with PT, supervision with OT, modified independent and supervision with SLP. 8. Estimated rehab length of stay to reach the above functional goals is: 12-16 days. 9. Anticipated D/C setting: Home 10. Anticipated post D/C treatments: HH therapy and Home excercise program 11. Overall Rehab/Functional Prognosis: good  RECOMMENDATIONS: This patient's condition is appropriate for continued rehabilitative care in the following setting: Recommend CIR, however patient currently refusing stating that she does not need at present. Encouraged patient to discuss with husband. Patient has agreed to participate in recommended program. Potentially Note that insurance prior authorization may be required for reimbursement for recommended care.  Comment: Rehab Admissions Coordinator to follow up.   I have personally performed a face to face diagnostic evaluation, including, but not limited to relevant history and physical exam findings, of this patient and developed relevant assessment and plan.  Additionally, I have reviewed and concur with the physician assistant's documentation above.   Delice Lesch, MD, ABPMR Lavon Paganini Angiulli, PA-C 08/03/2018

## 2018-08-03 NOTE — Progress Notes (Signed)
Admit: 07/24/2018 LOS: [age]F with dialysis dependent AKI, nl baselien SCr after admission with hyperglycemia, acidosis  Subjective:  . CRRT stopped . UOP not all captured . SCr up some, K 2.8 was already repleted  08/05 0701 - 08/06 0700 In: 1901 [I.V.:668.8; NG/GT:1082.2; IV Piggyback:150] Out: 2031 [Urine:256]  Filed Weights   08/01/18 0450 08/02/18 0316 08/03/18 0500  Weight: (!) 136.9 kg (301 lb 13 oz) 135.3 kg (298 lb 4.5 oz) 126.4 kg (278 lb 10.6 oz)    Scheduled Meds: . chlorhexidine  15 mL Mouth Rinse BID  . docusate  100 mg Oral Daily  . feeding supplement (PRO-STAT SUGAR FREE 64)  30 mL Per Tube BID  . free water  100 mL Per Tube Q6H  . Gerhardt's butt cream   Topical BID  . insulin aspart  0-20 Units Subcutaneous Q4H  . insulin aspart  7 Units Subcutaneous Q4H  . insulin detemir  20 Units Subcutaneous Q12H  . levothyroxine  125 mcg Per Tube QAC breakfast  . mouth rinse  15 mL Mouth Rinse BID  . pantoprazole sodium  40 mg Per Tube Daily  . risperiDONE  3 mg Per Tube QHS   Continuous Infusions: . sodium chloride Stopped (07/31/18 1531)  . sodium chloride 20 mL/hr at 08/03/18 1200  . anticoagulant sodium citrate    . calcium gluconate infusion for CRRT Stopped (08/02/18 1436)  .  ceFAZolin (ANCEF) IV    . feeding supplement (VITAL 1.5 CAL) 40 mL/hr at 08/03/18 0600  . sodium citrate 2 %/dextrose 2.5% solution 3000 mL 430 mL/hr at 08/02/18 1200   PRN Meds:.sodium chloride, Place/Maintain arterial line **AND** sodium chloride, acetaminophen (TYLENOL) oral liquid 160 mg/5 mL, anticoagulant sodium citrate, clonazePAM, ipratropium-albuterol, ondansetron (ZOFRAN) IV  Current Labs: reviewed    Physical Exam:  Blood pressure (!) 102/59, pulse (!) 105, temperature 98.1 F (36.7 C), temperature source Oral, resp. rate (!) 5, height 5\' 9"  (1.753 m), weight 126.4 kg (278 lb 10.6 oz), last menstrual period 09/18/2012, SpO2 99 %. NAD Temp HD cath IJ Coarse BS diminishedi  n bases Tachy, regular, nl s1s2 no rub No sig LEE  A 1. Dialysis dependent AKI, CRRT 8/1-8/5,  2. Hyperglycemia/DKA, improved 3. Acidosis resolved 4. VDRF/AMS, improved 5. Schizophrenia 6. TCP, improving, not HIT  P . Daily labs, Strict I/Os; reassess tomorrow prior to HD  Pearson Grippe MD 08/03/2018, 12:46 PM  Recent Labs  Lab 08/01/18 1620  08/02/18 0435 08/02/18 0437 08/03/18 0415  NA 140   < > 142 139 143  K 3.2*   < > 3.1* 3.1* 2.8*  CL 89*   < > 87* 85* 88*  CO2 35*  --  42*  --  45*  GLUCOSE 249*   < > 292* 287* 218*  BUN 12   < > 7 9 21*  CREATININE 2.15*   < > 1.97* 1.70* 3.33*  CALCIUM 8.8*  --  8.9  --  8.5*  PHOS 3.0  --  2.7  --  4.0   < > = values in this interval not displayed.   Recent Labs  Lab 08/01/18 0446  08/02/18 0435 08/02/18 0437 08/03/18 0415  WBC 10.2  --  9.2  --  10.7*  HGB 8.7*   < > 8.7* 9.2* 8.4*  HCT 27.6*   < > 27.7* 27.0* 27.0*  MCV 83.1  --  83.2  --  85.7  PLT 80*  --  117*  --  144*   < > =  values in this interval not displayed.             

## 2018-08-03 NOTE — Evaluation (Signed)
Physical Therapy Evaluation Patient Details Name: [name] MRN: [id number] DOB: [birth year] Today's Date: 08/03/2018   History of Present Illness  Pt is a 47 y.o. female with PMH of HTN, lupus, goiter, paranoid schizophrenia, and DM, admitted to [place] ED with AMS. Found to have hypoglycemia with blood sugars of 1556, anion gap acidosis, elevated lactic acid. ETT 7/27-8/3.     Clinical Impression  Pt presents with an overall decrease in functional mobility secondary to above. PTA, pt indep and lives with family. Today, pt with generalized weakness and decreased activity tolerance, requiring min-modA+2 for short ambulation distance with unstable gait pattern. C/o dizziness with mobility (see vitals below). Feel pt would benefit from intensive CIR-level therapies to maximize functional mobility and return to PLOF. Will follow acutely to address established goals.   SpO2 100% on RA  Supine BP 109/60 Seated BP 109/70 Post-amb BP 98/71 Seated 2-min BP 96/63    Follow Up Recommendations CIR;Supervision for mobility/OOB    Equipment Recommendations  (TBD)    Recommendations for Other Services Rehab consult     Precautions / Restrictions Precautions Precautions: Fall Restrictions Weight Bearing Restrictions: No      Mobility  Bed Mobility Overal bed mobility: Needs Assistance Bed Mobility: Supine to Sit     Supine to sit: Min assist;+2 for physical assistance        Transfers Overall transfer level: Needs assistance Equipment used: 2 person hand held assist Transfers: Sit to/from Stand Sit to Stand: Min assist;+2 physical assistance         General transfer comment: LOB posteriorly when stepping backwards to chair requiring modA+2 for eccentric control to prevent fall  Ambulation/Gait Ambulation/Gait assistance: Min assist;+2 physical assistance;+2 safety/equipment Gait Distance (Feet): 10 Feet Assistive device: 2 person hand held assist Gait Pattern/deviations:  Step-to pattern;Shuffle;Drifts right/left;Trunk flexed Gait velocity: Decreased Gait velocity interpretation: <1.31 ft/sec, indicative of household ambulator General Gait Details: Unsteady, shuffling amb to bathroom with HHA and minA+2 to maintain balance; pt with bilat knee instability. Pt with c/o dizziness while using bathroom limiting further distance  Stairs            Wheelchair Mobility    Modified Rankin (Stroke Patients Only)       Balance Overall balance assessment: Needs assistance   Sitting balance-Leahy Scale: Fair       Standing balance-Leahy Scale: Poor Standing balance comment: Reliant on UE support                             Pertinent Vitals/Pain Pain Assessment: No/denies pain    Home Living Family/patient expects to be discharged to:: Private residence Living Arrangements: Spouse/significant other;Parent Available Help at Discharge: Family;Available 24 hours/day Type of Home: Apartment Home Access: Stairs to enter Entrance Stairs-Rails: Right Entrance Stairs-Number of Steps: 20 Home Layout: One level Home Equipment: Walker - 2 wheels;Cane - single point(per chart review, need to confirm)      Prior Function Level of Independence: Independent               Hand Dominance   Dominant Hand: Right    Extremity/Trunk Assessment   Upper Extremity Assessment Upper Extremity Assessment: Generalized weakness    Lower Extremity Assessment Lower Extremity Assessment: Generalized weakness    Cervical / Trunk Assessment Cervical / Trunk Assessment: Other exceptions Cervical / Trunk Exceptions: Forward head posture  Communication   Communication: Other (comment)(dysphonic)  Cognition Arousal/Alertness: Awake/alert Behavior During Therapy: Flat affect  Overall Cognitive Status: No family/caregiver present to determine baseline cognitive functioning                                 General Comments: most likely  close to baseline      General Comments      Exercises     Assessment/Plan    PT Assessment Patient needs continued PT services  PT Problem List Decreased strength;Decreased activity tolerance;Decreased balance;Decreased mobility;Decreased knowledge of use of DME;Decreased cognition       PT Treatment Interventions DME instruction;Gait training;Functional mobility training;Stair training;Therapeutic activities;Therapeutic exercise;Balance training;Cognitive remediation;Patient/family education    PT Goals (Current goals can be found in the Care Plan section)  Acute Rehab PT Goals Patient Stated Goal: to get stronger PT Goal Formulation: With patient Time For Goal Achievement: 08/17/18 Potential to Achieve Goals: Good    Frequency Min 3X/week   Barriers to discharge        Co-evaluation PT/OT/SLP Co-Evaluation/Treatment: Yes Reason for Co-Treatment: Complexity of the patient's impairments (multi-system involvement);For patient/therapist safety;To address functional/ADL transfers PT goals addressed during session: Mobility/safety with mobility;Balance         AM-PAC PT "6 Clicks" Daily Activity  Outcome Measure Difficulty turning over in bed (including adjusting bedclothes, sheets and blankets)?: A Little Difficulty moving from lying on back to sitting on the side of the bed? : Unable Difficulty sitting down on and standing up from a chair with arms (e.g., wheelchair, bedside commode, etc,.)?: Unable Help needed moving to and from a bed to chair (including a wheelchair)?: A Little Help needed walking in hospital room?: A Little Help needed climbing 3-5 steps with a railing? : A Lot 6 Click Score: 13    End of Session Equipment Utilized During Treatment: Gait belt Activity Tolerance: Patient tolerated treatment well Patient left: in chair;with call bell/phone within reach;with chair alarm set;Other (comment)(with SLP) Nurse Communication: Mobility status PT Visit  Diagnosis: Other abnormalities of gait and mobility (R26.89);Muscle weakness (generalized) (M62.81)    Time: 1021-1173 PT Time Calculation (min) (ACUTE ONLY): 29 min   Charges:   PT Evaluation $PT Eval Moderate Complexity: 1 Mod         Mabeline Caras, PT, DPT Acute Rehab Services  Pager: [place] 08/03/2018, 1:52 PM

## 2018-08-03 NOTE — Progress Notes (Signed)
Rehab Admissions Coordinator Note:  Per OT recommendation, Patient was screened by Jhonnie Garner for appropriateness for an Inpatient Acute Rehab Consult.  At this time, we are recommending Inpatient Rehab consult. AC will contact MD for request of IP rehab consult order. Please call if questions.   Jhonnie Garner 08/03/2018, 10:20 AM  I can be reached at [phone].

## 2018-08-03 NOTE — Progress Notes (Signed)
PROGRESS NOTE    [name]  EXB:[id number] DOB: May 03, 1971 DOA: 07/24/2018 PCP: Celene Squibb, MD   Brief Narrative: 47 year old with history of hypertension, lupus, goiter, paranoid schizophrenia, diabetes went to [facility] ED with altered mental status.  EMS called by family as she was sick for 3 days and lying on the couch.  Patient had seizure activity with EMS and given 5 mg of Versed. Found to have hyperglycemia with blood sugars of 1556, anion gap acidosis, elevated lactic acid. Intubated for altered mental status in the ED and transferred to [facility].  There are no prior history of seizures.  She had not been taking any of her other medications except psychiatric medications for the past 2 to 3 months.  Patient was admitted with severe DKA, intubated on admission in setting of acute metabolic encephalopathy. Patient develops fever the morning of July 30 th. She presented with acute on CKD failure with cr at 7.7. Prior cr per records 3 years ago was at 1.1. On July 31, renal function continue to worse, patient [facility] catheter placement. patient was started on CRRT. Patient was extubated 8-03. She develops mild post extubation stridor which resolve with racemic epi and decadron. Patient fail swallow evaluation 8-04.    Assessment & Plan:   Active Problems:   DKA (diabetic ketoacidosis) ([place])   DKA (diabetic ketoacidoses) (HCC)   Acute respiratory failure (HCC)   AKI (acute kidney injury) ([place])   Altered mental status   1-Acute Respiratory Insufficiency. In setting acute Metabolic encephalopathy;  -Improved. Extubated 8/3. -L basilar infiltrate-MSSA in sputum -Mild Upper airway Stridor 8/4>resolved p racemic epi/decadron -oxygen Saturation 98 on 1 L.  -on IV Ancef day 6 of IV antibiotics.  AKI on CKD stage II;  Last cr per records 1.1 Renal US; no acute abnormalities.  Required CRRT.  Nephrology following, recommend follow renal function tomorrow prior to HD.    DKA, DM not complaint;  DKA resolved.  Continue with levemir Q 12 hours.  7 units of novolog every 4 hours.  SSI.   Acute metabolic encephalopathy;  Seizure- reported on admit, no hx of  CT head is unremarkable. Improving.   PNA, Fever;  MSSA Sputum. On Ancef , day 6 of IV antibiotics.   Hypothyroidism;  Goiter, exophthalmos TSH; 6.3 Continue with synthroid.   OSA/OHS;  May need trial CPAP.   Hypotension,  Off pressors 8-03 BP soft.  On IV antibiotics.  Monitor.   Lactic acidosis resolved.   HTN; continue to hold Norvasc and metoprolol.    Hypokalemia; replete orally.   Morbid obesity;   GI System;  Vomiting episodes 07/29/2018, Abd film 8/2 nad . Post pyloric cortrak 8/2  Failed bedside swallow eval 8/4  Constipation-  Continue with tube feeding.   Thrombocytopenia;  INR HIT panel 8-03; negative.  Platelet improving   Schizophrenia on Cogentin, klonopin , depakote , invega , trazodone ambien  at home  On risperidone, PRN klonopin.  Resume depakote, lower dose.      DVT prophylaxis: SCD, due to thrombocytopenia.  Code Status: full code.  Family Communication: no family at bedside.  Disposition Plan: remain in the step down unit. Monitor ranl function.    Consultants:   Nephrology   CCM admitted patient.    Procedures:  ETT 7/27 >>8/3 07/28/2018 right IJ hemodialysis cath>>    Antimicrobials:  Zosyn 7/30>>7/31 Vanc 7/31 x 1 dose till cultures back>> Ancef 7/31>>   CULTURES: BCx2 7/27 >> NEG  BC 7/28>>NEG  UC 7/27 >> negative Cultures from 02/2018>> greater than 100,000 colonies unidentified organism>> BC x 2 7/30>> NEG  Sputum  7/30>> sa pending SS>> MSSA UC 7/31 >100K lactobacillus       Subjective: Alert. Voice soft.  She is able to tell me her name and age.  Denies chest pain. Productive cough    Objective: Vitals:   08/03/18 0000 08/03/18 0403 08/03/18 0500 08/03/18 0635  BP: 93/62   (!) 95/56  Pulse: (!) 109    (!) 103  Resp: 13   (!) 25  Temp:  98.2 F (36.8 C)    TempSrc:  Oral    SpO2: 100%   100%  Weight:   126.4 kg (278 lb 10.6 oz)   Height:        Intake/Output Summary (Last 24 hours) at 08/03/2018 0733 Last data filed at 08/03/2018 0600 Gross per 24 hour  Intake 1900.99 ml  Output 2031 ml  Net -130.01 ml   Filed Weights   08/01/18 0450 08/02/18 0316 08/03/18 0500  Weight: (!) 136.9 kg (301 lb 13 oz) 135.3 kg (298 lb 4.5 oz) 126.4 kg (278 lb 10.6 oz)    Examination:  General exam: NAD, NG tube in place.  Respiratory system: normal respiratory effort, bilateral crackles.  Cardiovascular system: S1 & S2 heard, RRR. No JVD, murmurs, rubs, gallops or clicks. No pedal edema. Gastrointestinal system: BS present, soft, nt Central nervous system: Alert  Follows some command.  Extremities: Symmetric 5 x 5 power. Skin: No rashes, lesions or ulcers    Data Reviewed: I have personally reviewed following labs and imaging studies  CBC: Recent Labs  Lab 07/28/18 0511 07/31/18 0806  08/01/18 0446  08/01/18 2327 08/02/18 0431 08/02/18 0435 08/02/18 0437 08/03/18 0415  WBC 5.6 8.6  --  10.2  --   --   --  9.2  --  10.7*  HGB 10.8* 9.2*   < > 8.7*   < > 9.9* 9.5* 8.7* 9.2* 8.4*  HCT 35.0* 29.8*   < > 27.6*   < > 29.0* 28.0* 27.7* 27.0* 27.0*  MCV 86.2 85.9  --  83.1  --   --   --  83.2  --  85.7  PLT 73* 64*  --  80*  --   --   --  117*  --  144*   < > = values in this interval not displayed.   Basic Metabolic Panel: Recent Labs  Lab 07/29/18 0450  07/30/18 0600  07/31/18 0413  07/31/18 1719  08/01/18 0446  08/01/18 1620  08/01/18 2327 08/02/18 0431 08/02/18 0435 08/02/18 0437 08/03/18 0415  NA 143   < > 141   < > 139   < > 141   < > 138   < > 140   < > 139 139 142 139 143  K 3.6   < > 3.7   < > 3.5   < > 3.5   < > 2.9*   < > 3.2*   < > 3.3* 3.1* 3.1* 3.1* 2.8*  CL 110   < > 104   < > 105  --  102  --  93*  --  89*   < > 85* 85* 87* 85* 88*  CO2 22   < > 24   < > 25   --  28  --  32  --  35*  --   --   --  42*  --  45*  GLUCOSE 171*   < > 146*   < > 165*  --  125*  --  203*  --  249*   < > 287* 309* 292* 287* 218*  BUN 67*   < > 33*   < > 23*  --  20  --  17  --  12   < > 10 7 7 9  21*  CREATININE 7.52*   < > 4.23*   < > 2.74*  --  2.24*  --  2.35*  --  2.15*   < > 1.80* 1.40* 1.97* 1.70* 3.33*  CALCIUM 7.1*   < > 7.8*   < > 7.9*  --  8.6*  --  8.5*  --  8.8*  --   --   --  8.9  --  8.5*  MG 2.0  --  2.4  --  2.4  --   --   --   --   --   --   --   --   --  1.7  --  2.2  PHOS 4.0   < > 3.7   < > 3.2  --  3.0  --  2.9  --  3.0  --   --   --  2.7  --  4.0   < > = values in this interval not displayed.   GFR: Estimated Creatinine Clearance: 29.1 mL/min (A) (by C-G formula based on SCr of 3.33 mg/dL (H)). Liver Function Tests: Recent Labs  Lab 07/30/18 1709 07/31/18 1719 08/01/18 0446 08/01/18 1620 08/02/18 0435  ALBUMIN 1.3* 1.5* 1.5* 1.7* 1.7*   No results for input(s): LIPASE, AMYLASE in the last 168 hours. No results for input(s): AMMONIA in the last 168 hours. Coagulation Profile: Recent Labs  Lab 07/31/18 0806 08/02/18 0435  INR 1.20 1.14   Cardiac Enzymes: No results for input(s): CKTOTAL, CKMB, CKMBINDEX, TROPONINI in the last 168 hours. BNP (last 3 results) No results for input(s): PROBNP in the last 8760 hours. HbA1C: Recent Labs    08/01/18 1252  HGBA1C 16.3*   CBG: Recent Labs  Lab 08/02/18 1105 08/02/18 1520 08/02/18 1917 08/02/18 2336 08/03/18 0402  GLUCAP 333* 238* 148* 234* 205*   Lipid Profile: No results for input(s): CHOL, HDL, LDLCALC, TRIG, CHOLHDL, LDLDIRECT in the last 72 hours. Thyroid Function Tests: No results for input(s): TSH, T4TOTAL, FREET4, T3FREE, THYROIDAB in the last 72 hours. Anemia Panel: No results for input(s): VITAMINB12, FOLATE, FERRITIN, TIBC, IRON, RETICCTPCT in the last 72 hours. Sepsis Labs: Recent Labs  Lab 07/28/18 0511 07/28/18 0515 07/29/18 0450  PROCALCITON 11.61  --  5.97   LATICACIDVEN  --  1.2  --     Recent Results (from the past 240 hour(s))  Urine culture     Status: None   Collection Time: 07/24/18 10:20 AM  Result Value Ref Range Status   Specimen Description   Final    URINE, CLEAN CATCH Performed at [facility], [address], [place], [place] [address]    Special Requests   Final    NONE Performed at [facility], [address], [place], [place] [address]    Culture   Final    NO GROWTH Performed at [facility], [place] [address], [place], [place] [address]    Report Status 07/25/2018 FINAL  Final  Culture, blood (routine x 2)     Status: None   Collection Time: 07/24/18  4:49 PM  Result Value Ref  Range Status   Specimen Description BLOOD LEFT FOOT  Final   Special Requests   Final    BOTTLES DRAWN AEROBIC ONLY Blood Culture results may not be optimal due to an inadequate volume of blood received in culture bottles   Culture   Final    NO GROWTH 5 DAYS Performed at [facility], [place] [address] [place], [place] [address]    Report Status 07/29/2018 FINAL  Final  Urine culture     Status: None   Collection Time: 07/24/18  6:54 PM  Result Value Ref Range Status   Specimen Description URINE, RANDOM  Final   Special Requests NONE  Final   Culture   Final    NO GROWTH Performed at [facility], [place] [address], [place], [place] [address]    Report Status 07/25/2018 FINAL  Final  MRSA PCR Screening     Status: None   Collection Time: 07/24/18  9:03 PM  Result Value Ref Range Status   MRSA by PCR NEGATIVE NEGATIVE Final    Comment:        The GeneXpert MRSA Assay (FDA approved for NASAL specimens only), is one component of a comprehensive MRSA colonization surveillance program. It is not intended to diagnose MRSA infection nor to guide or monitor treatment for MRSA infections. Performed at [facility], [place] [address] [place], [place] [address]   Culture, blood (routine x 2)     Status: None    Collection Time: 07/25/18  5:18 AM  Result Value Ref Range Status   Specimen Description BLOOD LEFT HAND  Final   Special Requests   Final    BOTTLES DRAWN AEROBIC ONLY Blood Culture results may not be optimal due to an inadequate volume of blood received in culture bottles   Culture   Final    NO GROWTH 5 DAYS Performed at [facility], [place] [address] [place], [place] [address]    Report Status 07/30/2018 FINAL  Final  Urine Culture     Status: Abnormal   Collection Time: 07/27/18  4:03 AM  Result Value Ref Range Status   Specimen Description URINE, CATHETERIZED  Final   Special Requests Normal  Final   Culture (A)  Final    >=100,000 COLONIES/mL LACTOBACILLUS SPECIES Standardized susceptibility testing for this organism is not available. Performed at [facility], [place] [address], [place], [place] [address]    Report Status 07/28/2018 FINAL  Final  Culture, blood (routine x 2)     Status: None   Collection Time: 07/27/18  5:25 AM  Result Value Ref Range Status   Specimen Description BLOOD RIGHT HAND  Final   Special Requests IN PEDIATRIC BOTTLE Blood Culture adequate volume  Final   Culture   Final    NO GROWTH 5 DAYS Performed at [facility], [place] [address] [place], [place] [address]    Report Status 08/01/2018 FINAL  Final  Culture, blood (routine x 2)     Status: None   Collection Time: 07/27/18  5:45 AM  Result Value Ref Range Status   Specimen Description BLOOD RIGHT HAND  Final   Special Requests   Final    BOTTLES DRAWN AEROBIC ONLY Blood Culture adequate volume   Culture   Final    NO GROWTH 5 DAYS Performed at [facility], [place] [address], [place], [place] [address]    Report Status 08/01/2018 FINAL  Final  Culture, respiratory (non-expectorated)     Status: None   Collection Time: 07/27/18 11:35 AM  Result Value Ref Range Status   Specimen Description TRACHEAL ASPIRATE  Final   Special Requests NONE  Final   Gram Stain   Final     ABUNDANT DEGENERATED CELLULAR MATERIAL PRESENT ABUNDANT GRAM POSITIVE COCCI IN PAIRS MODERATE GRAM VARIABLE ROD Performed at [facility], [place] [address] [place], [place] [address]    Culture ABUNDANT STAPHYLOCOCCUS AUREUS  Final   Report Status 07/30/2018 FINAL  Final   Organism ID, Bacteria STAPHYLOCOCCUS AUREUS  Final      Susceptibility   Staphylococcus aureus - MIC*    CIPROFLOXACIN 1 SENSITIVE Sensitive     ERYTHROMYCIN >=8 RESISTANT Resistant     GENTAMICIN <=0.5 SENSITIVE Sensitive     OXACILLIN 0.5 SENSITIVE Sensitive     TETRACYCLINE <=1 SENSITIVE Sensitive     VANCOMYCIN 1 SENSITIVE Sensitive     TRIMETH/SULFA <=10 SENSITIVE Sensitive     CLINDAMYCIN RESISTANT Resistant     RIFAMPIN <=0.5 SENSITIVE Sensitive     Inducible Clindamycin POSITIVE Resistant     * ABUNDANT STAPHYLOCOCCUS AUREUS         Radiology Studies: No results found.      Scheduled Meds: . chlorhexidine  15 mL Mouth Rinse BID  . docusate  100 mg Oral Daily  . feeding supplement (PRO-STAT SUGAR FREE 64)  30 mL Per Tube BID  . free water  100 mL Per Tube Q6H  . Gerhardt's butt cream   Topical BID  . insulin aspart  0-20 Units Subcutaneous Q4H  . insulin aspart  7 Units Subcutaneous Q4H  . insulin detemir  20 Units Subcutaneous Q12H  . levothyroxine  125 mcg Per Tube QAC breakfast  . mouth rinse  15 mL Mouth Rinse q12n4p  . pantoprazole sodium  40 mg Per Tube Daily  . potassium chloride  40 mEq Oral Once  . risperiDONE  3 mg Per Tube QHS   Continuous Infusions: . sodium chloride Stopped (07/31/18 1531)  . sodium chloride 20 mL/hr at 08/03/18 0600  . anticoagulant sodium citrate    . calcium gluconate infusion for CRRT Stopped (08/02/18 1436)  .  ceFAZolin (ANCEF) IV    . feeding supplement (VITAL 1.5 CAL) 40 mL/hr at 08/03/18 0600  . sodium citrate 2 %/dextrose 2.5% solution 3000 mL 430 mL/hr at 08/02/18 1200     LOS: 10 days    Time spent: 35 minutes     Elmarie Shiley, MD [facility] Pager [phone]  If 7PM-7AM, please contact night-coverage [email or web address] Password [facility] 08/03/2018, 7:33 AM

## 2018-08-03 NOTE — Progress Notes (Signed)
  Speech Language Pathology Treatment: Dysphagia  Patient Details Name: [name] MRN: [id number] DOB: March 12, 1971 Today's Date: 08/03/2018 Time: 8811-0315 SLP Time Calculation (min) (ACUTE ONLY): 14 min  Assessment / Plan / Recommendation Clinical Impression  Pt can produce brief, dysphonic phonation that remains moderately-severely hoarse but it improved from previous date. Her voice remains unchanged across PO trials, with only intermittent coughing observed across ice chips and spoonfuls of water. Delayed coughing was noted with small amounts of puree, but with cough sounding strong although not productive. Recommend proceeding with FEES to better assess oropharyngeal function. Will attempt on next date, to allow for some additional time for healing post-extubation particularly given improvements observed from yesterday to today.   HPI HPI: 47 year old with history of hypertension, lupus, goiter, paranoid schizophrenia, diabetes went to [facility] ED with altered mental status.  EMS called by family as she was sick for 3 days and lying on the couch.  Patient had seizure activity with EMS and given 5 mg of Versed.  She was found to have hypoglycemia with blood sugars of 1556, anion gap acidosis, elevated lactic acid. Intubated for altered mental status in the ED and transferred to [facility].  As per the family she did not have any fevers, nausea, vomiting, diarrhea, pain prior to this.  They are not aware that she has diabetes but she does have metformin on her med list.  There are no prior history of seizures.  She had not been taking any of her other medications except psychiatric medications for the past 2 to 3 months.  Family has noticed that she was having excessive urination over the past few weeks.  Most recent chest xray is showing interval clearing in the left lung base.  CT head was showing normal appearance of the brain.        SLP Plan  Other (Comment)(FEES)        Recommendations  Diet recommendations: NPO Medication Administration: Via alternative means                Oral Care Recommendations: Oral care QID Follow up Recommendations: Inpatient Rehab SLP Visit Diagnosis: Dysphagia, oropharyngeal phase (R13.12) Plan: Other (Comment)(FEES)       GO                Germain Osgood 08/03/2018, 9:55 AM  Germain Osgood, M.A. CCC-SLP [phone]

## 2018-08-03 NOTE — Progress Notes (Addendum)
Occupational Therapy Evaluation Patient Details Name: [name] MRN: [id number] DOB: 12-17-1971 Today's Date: 08/03/2018    History of Present Illness 47 year old with history of hypertension, lupus, goiter, paranoid schizophrenia, diabetes went to [facility] ED with altered mental status.   Patient had seizure activity with EMS and given 5 mg of Versed.. Found to have hypoglycemia with blood sugars of 1556, anion gap acidosis, elevated lactic acid. Intubated for altered mental status in the ED and transferred to [facility] Extubated 08/02/18.    Clinical Impression   PTA, pt lived with her husband and father and was independent with ADL, IADL tasks and mobility. Pt demonstrates a significant functional decline and would benefit from rehab at CIR to facilitate safe return home and maximize functional level of independence. Will follow acutely to address established goals and facilitate DC to next venue of care.     Follow Up Recommendations  CIR;Supervision/Assistance - 24 hour    Equipment Recommendations  3 in 1 bedside commode(RW)    Recommendations for Other Services Rehab consult     Precautions / Restrictions Precautions Precautions: Fall Restrictions Weight Bearing Restrictions: No      Mobility Bed Mobility                  Transfers Overall transfer level: Needs assistance   Transfers: Sit to/from Stand Sit to Stand: Min assist;+2 physical assistance         General transfer comment: LOB posteriorly when stepping backwards to chair requiring Mod A +2    Balance Overall balance assessment: Needs assistance   Sitting balance-Leahy Scale: Fair       Standing balance-Leahy Scale: Poor Standing balance comment: relying on external support                           ADL either performed or assessed with clinical judgement   ADL Overall ADL's : Needs assistance/impaired Eating/Feeding: NPO   Grooming: Minimal  assistance;Sitting   Upper Body Bathing: Minimal assistance;Sitting   Lower Body Bathing: Moderate assistance;Sit to/from stand   Upper Body Dressing : Moderate assistance;Sitting   Lower Body Dressing: Moderate assistance;Sit to/from stand Lower Body Dressing Details (indicate cue type and reason): able to donn L sock @ bed level Toilet Transfer: Minimal assistance;Cueing for safety;BSC;Ambulation;+2 for safety/equipment;Grab bars   Toileting- Clothing Manipulation and Hygiene: Minimal assistance;Sit to/from stand       Functional mobility during ADLs: Minimal assistance;+2 for physical assistance;Cueing for safety General ADL Comments: LOB when stepping backward to chair requiring Mod A +2 for safety     Vision Baseline Vision/History: Wears glasses Wears Glasses: Reading only       Perception     Praxis      Pertinent Vitals/Pain Pain Assessment: No/denies pain(Simultaneous filing. User may not have seen previous data.)     Hand Dominance Right   Extremity/Trunk Assessment Upper Extremity Assessment Upper Extremity Assessment: Generalized weakness   Lower Extremity Assessment Lower Extremity Assessment: Defer to PT evaluation   Cervical / Trunk Assessment Cervical / Trunk Assessment: Other exceptions(forward head)   Communication Communication Communication: Other (comment)(dysphonic)   Cognition Arousal/Alertness: Awake/alert Behavior During Therapy: Flat affect Overall Cognitive Status: No family/caregiver present to determine baseline cognitive functioning                                 General Comments: most likely close  to baseline   General Comments       Exercises     Shoulder Instructions      Home Living Family/patient expects to be discharged to:: Private residence   Available Help at Discharge: Family;Available 24 hours/day Type of Home: Apartment Home Access: Stairs to enter Entrance Stairs-Number of Steps: 20 Entrance  Stairs-Rails: Right Home Layout: One level     Bathroom Shower/Tub: Corporate investment banker: Standard Bathroom Accessibility: Yes How Accessible: Accessible via walker Home Equipment: [place] - 2 wheels;Cane - single point(per chart review. Need to confirm)          Prior Functioning/Environment Level of Independence: Independent                 OT Problem List: Decreased strength;Decreased activity tolerance;Impaired balance (sitting and/or standing);Decreased safety awareness;Decreased knowledge of use of DME or AE;Cardiopulmonary status limiting activity;Obesity      OT Treatment/Interventions: Self-care/ADL training;Therapeutic exercise;Energy conservation;DME and/or AE instruction;Therapeutic activities;Balance training;Patient/family education    OT Goals(Current goals can be found in the care plan section) Acute Rehab OT Goals Patient Stated Goal: to get stronger OT Goal Formulation: With patient Time For Goal Achievement: 08/17/18 Potential to Achieve Goals: Good ADL Goals Pt Will Perform Upper Body Bathing: with modified independence Pt Will Perform Lower Body Bathing: with modified independence Pt Will Perform Lower Body Dressing: with modified independence Pt Will Transfer to Toilet: with modified independence  OT Frequency: Min 2X/week   Barriers to D/C:            Co-evaluation PT/OT/SLP Co-Evaluation/Treatment: Yes Reason for Co-Treatment: Complexity of the patient's impairments (multi-system involvement);For patient/therapist safety;To address functional/ADL transfers   OT goals addressed during session: ADL's and self-care      AM-PAC PT "6 Clicks" Daily Activity     Outcome Measure Help from another person eating meals?: Total(NPO) Help from another person taking care of personal grooming?: A Little Help from another person toileting, which includes using toliet, bedpan, or urinal?: A Lot Help from another person bathing  (including washing, rinsing, drying)?: A Lot Help from another person to put on and taking off regular upper body clothing?: A Little Help from another person to put on and taking off regular lower body clothing?: A Lot 6 Click Score: 13   End of Session Equipment Utilized During Treatment: Gait belt Nurse Communication: Mobility status  Activity Tolerance: Patient tolerated treatment well Patient left: in chair;with call bell/phone within reach;with chair alarm set  OT Visit Diagnosis: Unsteadiness on feet (R26.81);Muscle weakness (generalized) (M62.81)                Time: 5427-0623 OT Time Calculation (min): 29 min Charges:  OT General Charges $OT Visit: 1 Visit OT Evaluation $OT Eval Moderate Complexity: [place], OT/L  OT Clinical Specialist [phone]   [facility] 08/03/2018, 11:50 AM

## 2018-08-04 ENCOUNTER — Inpatient Hospital Stay (HOSPITAL_COMMUNITY): Payer: BLUE CROSS/BLUE SHIELD

## 2018-08-04 LAB — CBC
HEMATOCRIT: 26.6 % — AB (ref 36.0–46.0)
HEMOGLOBIN: 8.1 g/dL — AB (ref 12.0–15.0)
MCH: 26.5 pg (ref 26.0–34.0)
MCHC: 30.5 g/dL (ref 30.0–36.0)
MCV: 86.9 fL (ref 78.0–100.0)
Platelets: 170 10*3/uL (ref 150–400)
RBC: 3.06 MIL/uL — ABNORMAL LOW (ref 3.87–5.11)
RDW: 15.6 % — AB (ref 11.5–15.5)
WBC: 9.9 10*3/uL (ref 4.0–10.5)

## 2018-08-04 LAB — GLUCOSE, CAPILLARY
GLUCOSE-CAPILLARY: 218 mg/dL — AB (ref 70–99)
Glucose-Capillary: 205 mg/dL — ABNORMAL HIGH (ref 70–99)
Glucose-Capillary: 248 mg/dL — ABNORMAL HIGH (ref 70–99)
Glucose-Capillary: 281 mg/dL — ABNORMAL HIGH (ref 70–99)
Glucose-Capillary: 212 mg/dL — ABNORMAL HIGH (ref 70–99)
Glucose-Capillary: 169 mg/dL — ABNORMAL HIGH (ref 70–99)

## 2018-08-04 LAB — RENAL FUNCTION PANEL
ALBUMIN: 2 g/dL — AB (ref 3.5–5.0)
ANION GAP: 11 (ref 5–15)
BUN: 43 mg/dL — AB (ref 6–20)
CALCIUM: 8.3 mg/dL — AB (ref 8.9–10.3)
CO2: 43 mmol/L — AB (ref 22–32)
Chloride: 89 mmol/L — ABNORMAL LOW (ref 98–111)
Creatinine, Ser: 3.07 mg/dL — ABNORMAL HIGH (ref 0.44–1.00)
GFR calc Af Amer: 19 mL/min — ABNORMAL LOW (ref >60)
GFR calc non Af Amer: 17 mL/min — ABNORMAL LOW (ref >60)
GLUCOSE: 263 mg/dL — AB (ref 70–99)
POTASSIUM: 3 mmol/L — AB (ref 3.5–5.1)
Phosphorus: 4.3 mg/dL (ref 2.5–4.6)
SODIUM: 143 mmol/L (ref 135–145)

## 2018-08-04 MED ORDER — SODIUM CHLORIDE 0.9 % IV BOLUS: 1000.0000 mL | Freq: Once | INTRAVENOUS | Status: DC

## 2018-08-04 MED ORDER — POTASSIUM CHLORIDE 20 MEQ/15ML (10%) PO SOLN
40.0000 meq | Freq: Once | ORAL | Status: AC
  Administered 2018-08-04: 40 meq
  Filled 2018-08-04: qty 30

## 2018-08-04 MED ORDER — POTASSIUM CHLORIDE 10 MEQ/100ML IV SOLN: 10.0000 meq | INTRAVENOUS | Status: DC

## 2018-08-04 MED ORDER — POTASSIUM CHLORIDE 20 MEQ PO PACK: 40.0000 meq | PACK | Freq: Once | ORAL | Status: DC

## 2018-08-04 NOTE — Progress Notes (Signed)
Physical Therapy Treatment Patient Details Name: BRIELYNN SEKULA MRN: [id number] DOB: 01/07/1971 Today's Date: 08/04/2018    History of Present Illness Pt is a 47 y.o. female with PMH of HTN, lupus, goiter, paranoid schizophrenia, and DM, admitted to [place] ED with AMS. Found to have hypoglycemia with blood sugars of 1556, anion gap acidosis, elevated lactic acid. ETT 7/27-8/3.    PT Comments    Pt progressing with mobility. Today's session focused on strength and activity tolerance. Pt requires consistent min-modA to maintain standing and dynamic balance. Poor postural reactions/balance strategies, requiring modA to prevent LOB with minimal challenge. SpO2 94% on RA throughout. Demonstrates poor insight into deficits and slowed processing. Max encouragement to use voice during session.  Pt reports she does not recall conversation with CIR coordinator or MD. Pt agrees she is not safe to return home in current condition. Reports agreeable to continued rehab before d/c home.   Follow Up Recommendations  CIR;Supervision for mobility/OOB     Equipment Recommendations  (TBD)    Recommendations for Other Services Rehab consult     Precautions / Restrictions Precautions Precautions: Fall Precaution Comments: Safety sitter Restrictions Weight Bearing Restrictions: No    Mobility  Bed Mobility Overal bed mobility: Needs Assistance Bed Mobility: Supine to Sit     Supine to sit: Min assist     General bed mobility comments: HHA to scoot hips to EOB; increased time and effort   Transfers Overall transfer level: Needs assistance Equipment used: 1 person hand held assist Transfers: Sit to/from Stand Sit to Stand: Min assist         General transfer comment: Performed 4x sit<>stand total, requiring HHA and minA to maintain balance  Ambulation/Gait Ambulation/Gait assistance: Min assist;Mod assist Gait Distance (Feet): 5 Feet Assistive device: 1 person hand held assist Gait  Pattern/deviations: Step-to pattern;Shuffle;Drifts right/left;Trunk flexed Gait velocity: Decreased Gait velocity interpretation: <1.31 ft/sec, indicative of household ambulator General Gait Details: Unsteady amb reliant on HHA and min-modA to maintain balance. Poor awareness into safety/balance requiring multimodal cues for safety   Stairs             Wheelchair Mobility    Modified Rankin (Stroke Patients Only)       Balance Overall balance assessment: Needs assistance   Sitting balance-Leahy Scale: Fair Sitting balance - Comments: ModA to prevent anterior LOB when attempting to pull up sock sitting EOB     Standing balance-Leahy Scale: Poor Standing balance comment: Reliant on UE support and external assist to maintain static balance                            Cognition Arousal/Alertness: Awake/alert Behavior During Therapy: Flat affect Overall Cognitive Status: No family/caregiver present to determine baseline cognitive functioning                                 General Comments: Max encouragement to vocalize words versus nodding/motioning; voice is sounding better than better      Exercises      General Comments        Pertinent Vitals/Pain Pain Assessment: No/denies pain    Home Living                      Prior Function            PT Goals (current goals can now be found  in the care plan section) Acute Rehab PT Goals Patient Stated Goal: Get stronger at rehab then get back home PT Goal Formulation: With patient Time For Goal Achievement: 08/17/18 Potential to Achieve Goals: Good Progress towards PT goals: Progressing toward goals    Frequency    Min 3X/week      PT Plan Current plan remains appropriate    Co-evaluation              AM-PAC PT "6 Clicks" Daily Activity  Outcome Measure  Difficulty turning over in bed (including adjusting bedclothes, sheets and blankets)?: A Little Difficulty  moving from lying on back to sitting on the side of the bed? : Unable Difficulty sitting down on and standing up from a chair with arms (e.g., wheelchair, bedside commode, etc,.)?: Unable Help needed moving to and from a bed to chair (including a wheelchair)?: A Little Help needed walking in hospital room?: A Little Help needed climbing 3-5 steps with a railing? : A Lot 6 Click Score: 13    End of Session Equipment Utilized During Treatment: Gait belt Activity Tolerance: Patient tolerated treatment well Patient left: in chair;with call bell/phone within reach;with chair alarm set;with nursing/sitter in room Nurse Communication: Mobility status;Other (comment)(Does not need purewick/can stand pivot to [facility]) PT Visit Diagnosis: Other abnormalities of gait and mobility (R26.89);Muscle weakness (generalized) (M62.81)     Time: 4580-9983 PT Time Calculation (min) (ACUTE ONLY): 29 min  Charges:  $Therapeutic Activity: 23-37 mins                    Mabeline Caras, PT, DPT Acute Rehab Services  Pager: [place] 08/04/2018, 3:16 PM

## 2018-08-04 NOTE — Progress Notes (Signed)
Inpatient Rehabilitation-Admissions Coordinator    Met with patient at the bedside to discuss team's recommendation for inpatient rehabilitation. Shared booklets, expectations while in CIR, expected length of stay, and anticipated functional level at DC.   Due to noted cognitive deficits, AC spoke with husband (after receiving consent) regarding CIR as an option. At this time, it is recommended that even after CIR, pt will need 24/7 supervision. Per pt's husband, he can only be home with her in the mornings and then til 2:30PM. Pt's husband has asked for [facility] to meet with him on Friday around 10:00AM to discuss CIR in further details. AC stressed the importance of needing a solid discharge plan prior to admit to CIR. AC will continue to follow pt.   Jhonnie Garner, OTR/L  Rehab Admissions Coordinator  [phone] 08/04/2018 3:56 PM

## 2018-08-04 NOTE — Progress Notes (Signed)
Admit: 07/24/2018 LOS: 9  85F with dialysis dependent AKI, nl baselien SCr after admission with hyperglycemia, acidosis  Subjective:  . No labs yet today . More than 1 L of urine already today, incompletely measured urine output yesterday   08/06 0701 - 08/07 0700 In: 1422.5 [I.V.:252.3; NG/GT:1120.3; IV Piggyback:50] Out: 1620 [Urine:1020; Emesis/NG output:600]  Filed Weights   08/02/18 0316 08/03/18 0500 08/04/18 0500  Weight: 135.3 kg (298 lb 4.5 oz) 126.4 kg (278 lb 10.6 oz) 126 kg (277 lb 12.5 oz)    Scheduled Meds: . chlorhexidine  15 mL Mouth Rinse BID  . divalproex  500 mg Oral QHS  . docusate  100 mg Oral Daily  . feeding supplement (PRO-STAT SUGAR FREE 64)  30 mL Per Tube BID  . free water  100 mL Per Tube Q6H  . Gerhardt's butt cream   Topical BID  . insulin aspart  0-20 Units Subcutaneous Q4H  . insulin aspart  7 Units Subcutaneous Q4H  . insulin detemir  20 Units Subcutaneous Q12H  . levothyroxine  125 mcg Per Tube QAC breakfast  . mouth rinse  15 mL Mouth Rinse BID  . pantoprazole sodium  40 mg Per Tube Daily  . risperiDONE  3 mg Per Tube QHS   Continuous Infusions: . sodium chloride Stopped (07/31/18 1531)  . sodium chloride Stopped (08/03/18 2057)  . anticoagulant sodium citrate    . calcium gluconate infusion for CRRT Stopped (08/02/18 1436)  .  ceFAZolin (ANCEF) IV Stopped (08/03/18 1830)  . feeding supplement (VITAL 1.5 CAL) 55 mL/hr at 08/04/18 0500  . sodium citrate 2 %/dextrose 2.5% solution 3000 mL 430 mL/hr at 08/02/18 1200   PRN Meds:.sodium chloride, Place/Maintain arterial line **AND** sodium chloride, acetaminophen (TYLENOL) oral liquid 160 mg/5 mL, anticoagulant sodium citrate, clonazePAM, ipratropium-albuterol, ondansetron (ZOFRAN) IV  Current Labs: reviewed    Physical Exam:  Blood pressure 127/80, pulse (!) 108, temperature 98.8 F (37.1 C), temperature source Oral, resp. rate 19, height 5\' 9"  (1.753 m), weight 126 kg (277 lb 12.5 oz),  last menstrual period 09/18/2012, SpO2 98 %. NAD Temp HD cath IJ Coarse BS diminishedi n bases Tachy, regular, nl s1s2 no rub No sig LEE  A 1. Dialysis dependent AKI, CRRT 8/1-8/5  2. Hyperglycemia/DKA, improved 3. Acidosis resolved 4. VDRF/AMS, improved 5. Schizophrenia 6. TCP, improving, not HIT  P . Follow-up later for today's labs.  Dialysis if hyperkalemic or acidotic; otherwise given increased urine output will try to continue to monitor off of dialysis . Daily labs, Strict I/Os; reassess tomorrow prior to HD    Pearson Grippe MD 08/04/2018, 11:54 AM  Recent Labs  Lab 08/01/18 1620  08/02/18 0435 08/02/18 0437 08/03/18 0415  NA 140   < > 142 139 143  K 3.2*   < > 3.1* 3.1* 2.8*  CL 89*   < > 87* 85* 88*  CO2 35*  --  42*  --  45*  GLUCOSE 249*   < > 292* 287* 218*  BUN 12   < > 7 9 21*  CREATININE 2.15*   < > 1.97* 1.70* 3.33*  CALCIUM 8.8*  --  8.9  --  8.5*  PHOS 3.0  --  2.7  --  4.0   < > = values in this interval not displayed.   Recent Labs  Lab 08/01/18 0446  08/02/18 0435 08/02/18 0437 08/03/18 0415  WBC 10.2  --  9.2  --  10.7*  HGB 8.7*   < >  8.7* 9.2* 8.4*  HCT 27.6*   < > 27.7* 27.0* 27.0*  MCV 83.1  --  83.2  --  85.7  PLT 80*  --  117*  --  144*   < > = values in this interval not displayed.

## 2018-08-04 NOTE — Procedures (Signed)
Objective Swallowing Evaluation: Type of Study: FEES-Fiberoptic Endoscopic Evaluation of Swallow   Patient Details  Name: NYANA HAREN MRN: [id number] Date of Birth: 06-Jan-1971  Today's Date: 08/04/2018 Time: SLP Start Time (ACUTE ONLY): 1003 -SLP Stop Time (ACUTE ONLY): 1028  SLP Time Calculation (min) (ACUTE ONLY): 25 min   Past Medical History:  Past Medical History:  Diagnosis Date  . Anemia   . 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 Delusional disorder ([place])   . DVT (deep venous thrombosis) ([place]) 09/30/12   in upper extremities b/l and LLE  . Fibroid tumor   . Graves disease   . 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 Arnoldo Morale on 12/13/2012.  Marland Kitchen Hypertension   . Lupus anticoagulant positive 10/02/2012  . Multinodular goiter 10/01/2012  . Paranoid schizophrenia ([place])   . Thyroid disease   . Thyroid nodule    s/p biopsy   Past Surgical History:  Past Surgical History:  Procedure Laterality Date  . ABDOMINAL HYSTERECTOMY    . HEMATOMA EVACUATION  10/03/2012   Procedure: EVACUATION HEMATOMA;  Surgeon: Jonnie Kind, MD;  Location: [facility];  Service: Gynecology;  Laterality: N/A;  . SUPRACERVICAL ABDOMINAL HYSTERECTOMY  09/27/2012   Procedure: HYSTERECTOMY SUPRACERVICAL ABDOMINAL;  Surgeon: Jonnie Kind, MD;  Location: [facility];  Service: Gynecology;  Laterality: N/A;  . THYROIDECTOMY  12/13/2012   Procedure: THYROIDECTOMY;  Surgeon: Jamesetta So, MD;  Location: [facility];  Service: General;  Laterality: Bilateral;   HPI: Pt is a 47 y.o. female with PMH of HTN, lupus, goiter, paranoid schizophrenia, graves disease, and DM, admitted to [place] ED with AMS. Found to have hyperglycemia with blood sugars of 1556, anion gap acidosis, elevated lactic acid. ETT 7/27-8/3.     Subjective: eager for POs    Assessment / Plan / Recommendation  CHL IP CLINICAL IMPRESSIONS 08/04/2018  Clinical Impression Pt has a moderate oropharyngeal  dysphagia that is likely related to sensorimotor and anatomical changes post-extubation. She has bilateral vocal fold movement, but with incomplete glottal closure with bilateral lesions that appear to represent where the ETT was located. Her oral phase is not well organized, with delayed transit, premature spillage, and piecemeal swallowing noted. Residue remains on the base of tongue with purees. Pt has aspiration before the swallow with ice chips and nectar thick liquids due to impaired timing and incomplete airway protection. She has better airway protection with spoonfuls of honey thick liquids and purees when utilizing a chin tuck, but unfortunately she has increased residue with these consistencies as well, which mixes with secretions and is deeply penetrated and aspirated after the swallow. Pt has intermittent sensation of aspiration and penetration events. Whether she is coughing reflexively or with cues she has enough force to expel most material from her airway, but unfortunately she re-penetrates as she tries to swallow again. Recommend that she remain NPO except for therapeutic trials of puree/honey with SLP only. Will initiate pharyngeal strengthening exercises and continue to follow for readiness to repeat swallow study with clinical signs of improvement.  SLP Visit Diagnosis Dysphagia, oropharyngeal phase (R13.12)  Attention and concentration deficit following --  Frontal lobe and executive function deficit following --  Impact on safety and function Severe aspiration risk      CHL IP TREATMENT RECOMMENDATION 08/04/2018  Treatment Recommendations Therapy as outlined in treatment plan below     Prognosis 08/04/2018  Prognosis for Safe Diet Advancement Good  Barriers to  Reach Goals --  Barriers/Prognosis Comment --    CHL IP DIET RECOMMENDATION 08/04/2018  SLP Diet Recommendations NPO;Alternative means - temporary  Liquid Administration via --  Medication Administration Via alternative  means  Compensations --  Postural Changes --      CHL IP OTHER RECOMMENDATIONS 08/04/2018  Recommended Consults --  Oral Care Recommendations Oral care QID  Other Recommendations Have oral suction available      CHL IP FOLLOW UP RECOMMENDATIONS 08/04/2018  Follow up Recommendations Inpatient Rehab      CHL IP FREQUENCY AND DURATION 08/04/2018  Speech Therapy Frequency (ACUTE ONLY) min 3x week  Treatment Duration 2 weeks           CHL IP ORAL PHASE 08/04/2018  Oral Phase Impaired  Oral - Pudding Teaspoon --  Oral - Pudding Cup --  Oral - Honey Teaspoon Delayed oral transit;Decreased bolus cohesion;Piecemeal swallowing  Oral - Honey Cup --  Oral - Nectar Teaspoon Delayed oral transit;Decreased bolus cohesion;Piecemeal swallowing;Premature spillage  Oral - Nectar Cup --  Oral - Nectar Straw --  Oral - Thin Teaspoon Delayed oral transit;Decreased bolus cohesion;Piecemeal swallowing;Premature spillage;Other (Comment)  Oral - Thin Cup --  Oral - Thin Straw --  Oral - Puree Delayed oral transit;Decreased bolus cohesion;Piecemeal swallowing;Other (Comment)  Oral - Mech Soft --  Oral - Regular --  Oral - Multi-Consistency --  Oral - Pill --  Oral Phase - Comment --    CHL IP PHARYNGEAL PHASE 08/04/2018  Pharyngeal Phase Impaired  Pharyngeal- Pudding Teaspoon --  Pharyngeal --  Pharyngeal- Pudding Cup --  Pharyngeal --  Pharyngeal- Honey Teaspoon Delayed swallow initiation-pyriform sinuses;Reduced pharyngeal peristalsis;Reduced epiglottic inversion;Reduced anterior laryngeal mobility;Reduced laryngeal elevation;Reduced airway/laryngeal closure;Reduced tongue base retraction;Penetration/Apiration after swallow;Pharyngeal residue - valleculae;Pharyngeal residue - pyriform  Pharyngeal Material enters airway, CONTACTS cords and not ejected out  Pharyngeal- Honey Cup --  Pharyngeal --  Pharyngeal- Nectar Teaspoon Delayed swallow initiation-pyriform sinuses;Reduced pharyngeal  peristalsis;Reduced epiglottic inversion;Reduced anterior laryngeal mobility;Reduced laryngeal elevation;Reduced airway/laryngeal closure;Reduced tongue base retraction;Penetration/Apiration after swallow;Pharyngeal residue - valleculae;Pharyngeal residue - pyriform;Penetration/Aspiration during swallow  Pharyngeal Material enters airway, passes BELOW cords and not ejected out despite cough attempt by patient  Pharyngeal- Nectar Cup --  Pharyngeal --  Pharyngeal- Nectar Straw --  Pharyngeal --  Pharyngeal- Thin Teaspoon Delayed swallow initiation-pyriform sinuses;Reduced pharyngeal peristalsis;Reduced epiglottic inversion;Reduced anterior laryngeal mobility;Reduced laryngeal elevation;Reduced airway/laryngeal closure;Reduced tongue base retraction;Penetration/Apiration after swallow;Pharyngeal residue - valleculae;Pharyngeal residue - pyriform;Penetration/Aspiration during swallow  Pharyngeal Material enters airway, passes BELOW cords and not ejected out despite cough attempt by patient  Pharyngeal- Thin Cup --  Pharyngeal --  Pharyngeal- Thin Straw --  Pharyngeal --  Pharyngeal- Puree Delayed swallow initiation-pyriform sinuses;Reduced pharyngeal peristalsis;Reduced epiglottic inversion;Reduced anterior laryngeal mobility;Reduced laryngeal elevation;Reduced airway/laryngeal closure;Reduced tongue base retraction;Penetration/Apiration after swallow;Pharyngeal residue - valleculae;Pharyngeal residue - pyriform  Pharyngeal Material enters airway, CONTACTS cords and not ejected out  Pharyngeal- Mechanical Soft --  Pharyngeal --  Pharyngeal- Regular --  Pharyngeal --  Pharyngeal- Multi-consistency --  Pharyngeal --  Pharyngeal- Pill --  Pharyngeal --  Pharyngeal Comment --     CHL IP CERVICAL ESOPHAGEAL PHASE 08/04/2018  Cervical Esophageal Phase WFL  Pudding Teaspoon --  Pudding Cup --  Honey Teaspoon --  Honey Cup --  Nectar Teaspoon --  Nectar Cup --  Nectar Straw --  Thin Teaspoon --   Thin Cup --  Thin Straw --  Puree --  Mechanical Soft --  Regular --  Multi-consistency --  Pill --  Cervical Esophageal Comment --     Germain Osgood 08/04/2018, 10:59 AM    Germain Osgood, M.A. CCC-SLP [phone]

## 2018-08-04 NOTE — Progress Notes (Signed)
PROGRESS NOTE    [name]  ZDG:[id number] DOB: 1971/11/07 DOA: 07/24/2018 PCP: Celene Squibb, MD   Brief Narrative: 47 year old with history of hypertension, lupus, goiter, paranoid schizophrenia, diabetes went to [facility] ED with altered mental status.  EMS called by family as she was sick for 3 days and lying on the couch.  Patient had seizure activity with EMS and given 5 mg of Versed. Found to have hyperglycemia with blood sugars of 1556, anion gap acidosis, elevated lactic acid. Intubated for altered mental status in the ED and transferred to [facility].  There are no prior history of seizures.  She had not been taking any of her other medications except psychiatric medications for the past 2 to 3 months.  Patient was admitted with severe DKA, intubated on admission in setting of acute metabolic encephalopathy. Patient develops fever the morning of July 30 th. She presented with acute on CKD failure with cr at 7.7. Prior cr per records 3 years ago was at 1.1. On July 31, renal function continue to worse, patient [facility] catheter placement. patient was started on CRRT. Patient was extubated 8-03. She develops mild post extubation stridor which resolve with racemic epi and decadron. Patient fail swallow evaluation 8-04.    Assessment & Plan:   Active Problems:   DKA (diabetic ketoacidosis) ([place])   DKA (diabetic ketoacidoses) (HCC)   Acute respiratory failure (HCC)   AKI (acute kidney injury) ([place])   Altered mental status   Hypokalemia   Essential hypertension   Lupus ([place])   Poorly controlled type 2 diabetes mellitus (HCC)   Leukocytosis   1-Acute Respiratory Insufficiency. In setting acute Metabolic encephalopathy;  -Improved. Extubated 8/3. -L basilar infiltrate-MSSA in sputum -Mild Upper airway Stridor 8/4>resolved p racemic epi/decadron -oxygen  Requirements have increased, currently 92% on 40% FiO2 Will repeat chest x-ray today -on IV Ancef day 6 of IV  antibiotics.  AKI on CKD stage II;  Last cr per records 1.1 Renal US; no acute abnormalities.  creatinine 3.33 today Nephrology following, Dialysis dependent AKI, CRRT 8/1-8/5     DKA, DM not complaint;  DKA resolved.  Increase Levemir to 24 units twice a day 7 units of novolog every 4 hours.  SSI.   Acute metabolic encephalopathy;  Seizure- reported on admit, no hx of  CT head is unremarkable. Improving.   PNA, Fever;  MSSA Sputum. On Ancef , day 6 of IV antibiotics.   Hypothyroidism;  Goiter, exophthalmos TSH; 6.3 Continue with synthroid.   OSA/OHS;  May need trial CPAP.   Hypotension,  Off pressors 8-03 BP soft.  On IV antibiotics.  Monitor.   Lactic acidosis resolved.   HTN; continue to hold Norvasc and metoprolol.    Hypokalemia; replete orally.   Morbid obesity;   GI System;  Vomiting episodes 07/29/2018, Abd film 8/2 nad . Post pyloric cortrak 8/2  Failed bedside swallow eval 8/4  Constipation-  Continue with tube feeding.   Thrombocytopenia;  INR HIT panel 8-03; negative.  Platelet improving   Schizophrenia on Cogentin, klonopin , depakote , invega , trazodone ambien  at home  On risperidone, PRN klonopin.  Resume depakote, lower dose.      DVT prophylaxis: SCD, due to thrombocytopenia.  Code Status: full code.  Family Communication: no family at bedside.  Disposition Plan:  Continue stepdown, not stable for transfer   Consultants:   Nephrology   CCM admitted patient.    Procedures:  ETT 7/27 >>8/3 07/28/2018 right  IJ hemodialysis cath>>    Antimicrobials:  Zosyn 7/30>>7/31 Vanc 7/31 x 1 dose till cultures back>> Ancef 7/31>>   CULTURES: BCx2 7/27 >> NEG  BC 7/28>>NEG  UC 7/27 >> negative Cultures from 02/2018>> greater than 100,000 colonies unidentified organism>> BC x 2 7/30>> NEG  Sputum  7/30>> sa pending SS>> MSSA UC 7/31 >100K lactobacillus       Subjective: Alert. Voice soft.  She is able to tell me  her name and age.  Denies chest pain. Productive cough    Objective: Vitals:   08/04/18 0600 08/04/18 0700 08/04/18 0747 08/04/18 0800  BP: (!) 80/52 (!) 81/48  (!) 87/51  Pulse: (!) 104 (!) 111  (!) 104  Resp: 20 (!) 27  18  Temp:   98.8 F (37.1 C)   TempSrc:   Oral   SpO2: 94% 95%  92%  Weight:      Height:        Intake/Output Summary (Last 24 hours) at 08/04/2018 1017 Last data filed at 08/04/2018 0500 Gross per 24 hour  Intake 1217.53 ml  Output 1620 ml  Net -402.47 ml   Filed Weights   08/02/18 0316 08/03/18 0500 08/04/18 0500  Weight: 135.3 kg (298 lb 4.5 oz) 126.4 kg (278 lb 10.6 oz) 126 kg (277 lb 12.5 oz)    Examination:  General exam: NAD, NG tube in place.  Respiratory system: normal respiratory effort, bilateral crackles.  Cardiovascular system: S1 & S2 heard, RRR. No JVD, murmurs, rubs, gallops or clicks. No pedal edema. Gastrointestinal system: BS present, soft, nt Central nervous system: Alert  Follows some command.  Extremities: Symmetric 5 x 5 power. Skin: No rashes, lesions or ulcers    Data Reviewed: I have personally reviewed following labs and imaging studies  CBC: Recent Labs  Lab 07/31/18 0806  08/01/18 0446  08/01/18 2327 08/02/18 0431 08/02/18 0435 08/02/18 0437 08/03/18 0415  WBC 8.6  --  10.2  --   --   --  9.2  --  10.7*  HGB 9.2*   < > 8.7*   < > 9.9* 9.5* 8.7* 9.2* 8.4*  HCT 29.8*   < > 27.6*   < > 29.0* 28.0* 27.7* 27.0* 27.0*  MCV 85.9  --  83.1  --   --   --  83.2  --  85.7  PLT 64*  --  80*  --   --   --  117*  --  144*   < > = values in this interval not displayed.   Basic Metabolic Panel: Recent Labs  Lab 07/29/18 0450  07/30/18 0600  07/31/18 0413  07/31/18 1719  08/01/18 0446  08/01/18 1620  08/01/18 2327 08/02/18 0431 08/02/18 0435 08/02/18 0437 08/03/18 0415  NA 143   < > 141   < > 139   < > 141   < > 138   < > 140   < > 139 139 142 139 143  K 3.6   < > 3.7   < > 3.5   < > 3.5   < > 2.9*   < > 3.2*   < >  3.3* 3.1* 3.1* 3.1* 2.8*  CL 110   < > 104   < > 105  --  102  --  93*  --  89*   < > 85* 85* 87* 85* 88*  CO2 22   < > 24   < > 25  --  28  --  32  --  35*  --   --   --  42*  --  45*  GLUCOSE 171*   < > 146*   < > 165*  --  125*  --  203*  --  249*   < > 287* 309* 292* 287* 218*  BUN 67*   < > 33*   < > 23*  --  20  --  17  --  12   < > 10 7 7 9  21*  CREATININE 7.52*   < > 4.23*   < > 2.74*  --  2.24*  --  2.35*  --  2.15*   < > 1.80* 1.40* 1.97* 1.70* 3.33*  CALCIUM 7.1*   < > 7.8*   < > 7.9*  --  8.6*  --  8.5*  --  8.8*  --   --   --  8.9  --  8.5*  MG 2.0  --  2.4  --  2.4  --   --   --   --   --   --   --   --   --  1.7  --  2.2  PHOS 4.0   < > 3.7   < > 3.2  --  3.0  --  2.9  --  3.0  --   --   --  2.7  --  4.0   < > = values in this interval not displayed.   GFR: Estimated Creatinine Clearance: 29.1 mL/min (A) (by C-G formula based on SCr of 3.33 mg/dL (H)). Liver Function Tests: Recent Labs  Lab 07/30/18 1709 07/31/18 1719 08/01/18 0446 08/01/18 1620 08/02/18 0435  ALBUMIN 1.3* 1.5* 1.5* 1.7* 1.7*   No results for input(s): LIPASE, AMYLASE in the last 168 hours. No results for input(s): AMMONIA in the last 168 hours. Coagulation Profile: Recent Labs  Lab 07/31/18 0806 08/02/18 0435  INR 1.20 1.14   Cardiac Enzymes: No results for input(s): CKTOTAL, CKMB, CKMBINDEX, TROPONINI in the last 168 hours. BNP (last 3 results) No results for input(s): PROBNP in the last 8760 hours. HbA1C: Recent Labs    08/01/18 1252  HGBA1C 16.3*   CBG: Recent Labs  Lab 08/03/18 1618 08/03/18 1930 08/03/18 2312 08/04/18 0313 08/04/18 0716  GLUCAP 212* 219* 211* 218* 205*   Lipid Profile: No results for input(s): CHOL, HDL, LDLCALC, TRIG, CHOLHDL, LDLDIRECT in the last 72 hours. Thyroid Function Tests: No results for input(s): TSH, T4TOTAL, FREET4, T3FREE, THYROIDAB in the last 72 hours. Anemia Panel: No results for input(s): VITAMINB12, FOLATE, FERRITIN, TIBC, IRON,  RETICCTPCT in the last 72 hours. Sepsis Labs: Recent Labs  Lab 07/29/18 0450  PROCALCITON 5.97    Recent Results (from the past 240 hour(s))  Urine Culture     Status: Abnormal   Collection Time: 07/27/18  4:03 AM  Result Value Ref Range Status   Specimen Description URINE, CATHETERIZED  Final   Special Requests Normal  Final   Culture (A)  Final    >=100,000 COLONIES/mL LACTOBACILLUS SPECIES Standardized susceptibility testing for this organism is not available. Performed at [facility], [place] [address] [place], [place] [address]    Report Status 07/28/2018 FINAL  Final  Culture, blood (routine x 2)     Status: None   Collection Time: 07/27/18  5:25 AM  Result Value Ref Range Status   Specimen Description BLOOD RIGHT HAND  Final   Special Requests IN PEDIATRIC BOTTLE Blood Culture  adequate volume  Final   Culture   Final    NO GROWTH 5 DAYS Performed at [facility], [place] [address], [place], [place] [address]    Report Status 08/01/2018 FINAL  Final  Culture, blood (routine x 2)     Status: None   Collection Time: 07/27/18  5:45 AM  Result Value Ref Range Status   Specimen Description BLOOD RIGHT HAND  Final   Special Requests   Final    BOTTLES DRAWN AEROBIC ONLY Blood Culture adequate volume   Culture   Final    NO GROWTH 5 DAYS Performed at [facility], [place] [address] [place], [place] [address]    Report Status 08/01/2018 FINAL  Final  Culture, respiratory (non-expectorated)     Status: None   Collection Time: 07/27/18 11:35 AM  Result Value Ref Range Status   Specimen Description TRACHEAL ASPIRATE  Final   Special Requests NONE  Final   Gram Stain   Final    ABUNDANT DEGENERATED CELLULAR MATERIAL PRESENT ABUNDANT GRAM POSITIVE COCCI IN PAIRS MODERATE GRAM VARIABLE ROD Performed at [facility], [address], [place], [place] [address]    Culture ABUNDANT STAPHYLOCOCCUS AUREUS  Final   Report Status 07/30/2018 FINAL  Final    Organism ID, Bacteria STAPHYLOCOCCUS AUREUS  Final      Susceptibility   Staphylococcus aureus - MIC*    CIPROFLOXACIN 1 SENSITIVE Sensitive     ERYTHROMYCIN >=8 RESISTANT Resistant     GENTAMICIN <=0.5 SENSITIVE Sensitive     OXACILLIN 0.5 SENSITIVE Sensitive     TETRACYCLINE <=1 SENSITIVE Sensitive     VANCOMYCIN 1 SENSITIVE Sensitive     TRIMETH/SULFA <=10 SENSITIVE Sensitive     CLINDAMYCIN RESISTANT Resistant     RIFAMPIN <=0.5 SENSITIVE Sensitive     Inducible Clindamycin POSITIVE Resistant     * ABUNDANT STAPHYLOCOCCUS AUREUS         Radiology Studies: No results found.      Scheduled Meds: . chlorhexidine  15 mL Mouth Rinse BID  . divalproex  500 mg Oral QHS  . docusate  100 mg Oral Daily  . feeding supplement (PRO-STAT SUGAR FREE 64)  30 mL Per Tube BID  . free water  100 mL Per Tube Q6H  . Gerhardt's butt cream   Topical BID  . insulin aspart  0-20 Units Subcutaneous Q4H  . insulin aspart  7 Units Subcutaneous Q4H  . insulin detemir  20 Units Subcutaneous Q12H  . levothyroxine  125 mcg Per Tube QAC breakfast  . mouth rinse  15 mL Mouth Rinse BID  . pantoprazole sodium  40 mg Per Tube Daily  . risperiDONE  3 mg Per Tube QHS   Continuous Infusions: . sodium chloride Stopped (07/31/18 1531)  . sodium chloride Stopped (08/03/18 2057)  . anticoagulant sodium citrate    . calcium gluconate infusion for CRRT Stopped (08/02/18 1436)  .  ceFAZolin (ANCEF) IV Stopped (08/03/18 1830)  . feeding supplement (VITAL 1.5 CAL) 55 mL/hr at 08/04/18 0500  . sodium citrate 2 %/dextrose 2.5% solution 3000 mL 430 mL/hr at 08/02/18 1200     LOS: 11 days    Time spent: 35 minutes     Reyne Dumas, MD [facility] Pager [phone]  If 7PM-7AM, please contact night-coverage [email or web address] Password [email or web address] 08/04/2018, 10:17 AM

## 2018-08-04 NOTE — Progress Notes (Addendum)
Inpatient Diabetes Program Recommendations  AACE/ADA: New Consensus Statement on Inpatient Glycemic Control (2015)  Target Ranges:  Prepandial:   less than 140 mg/dL      Peak postprandial:   less than 180 mg/dL (1-2 hours)      Critically ill patients:  140 - 180 mg/dL   Lab Results  Component Value Date   GLUCAP 205 (H) 08/04/2018   HGBA1C 16.3 (H) 08/01/2018    Review of Glycemic Control Results for ADREAN, FINDLAY (MRN [id number]) as of 08/04/2018 09:57  Ref. Range 08/03/2018 19:30 08/03/2018 23:12 08/04/2018 03:13 08/04/2018 07:16  Glucose-Capillary Latest Ref Range: 70 - 99 mg/dL 219 (H) 211 (H) 218 (H) 205 (H)   Diabetes history: New onset? No noted mention of DM Outpatient Diabetes medications: none Current orders for Inpatient glycemic control: Novolog 0-20 units Q4H, Levemir 20 units BID, Novolog 7 units Q4H  Inpatient Diabetes Program Recommendations:    At this time, recommending increasing Levemir to 24 units BID given patient has received 99 units of short acting insulin in last 24 hours. Will attempt again to see patient today.  Thanks, Bronson Curb, MSN, RNC-OB Diabetes Coordinator [phone] (8a-5p)  Addendum @1400 : Attempted to speak with patient who mumbled through conversation. Unable to understand. Not appropriate for teaching.  Discussed POC with Everlene Other, RN. Patient awaiting tele bed, continuing to not tolerate advancing diet, may be approved for inpt rehab, concern for patient or husband to be compliant with medications. Feels that patient will not be safe to self administer. Will continue to follow.

## 2018-08-05 DIAGNOSIS — R05 Cough: Secondary | ICD-10-CM

## 2018-08-05 DIAGNOSIS — F250 Schizoaffective disorder, bipolar type: Secondary | ICD-10-CM

## 2018-08-05 DIAGNOSIS — R739 Hyperglycemia, unspecified: Secondary | ICD-10-CM

## 2018-08-05 DIAGNOSIS — G4700 Insomnia, unspecified: Secondary | ICD-10-CM

## 2018-08-05 LAB — BASIC METABOLIC PANEL
Anion gap: 9 (ref 5–15)
BUN: 43 mg/dL — ABNORMAL HIGH (ref 6–20)
CHLORIDE: 97 mmol/L — AB (ref 98–111)
CO2: 40 mmol/L — AB (ref 22–32)
Calcium: 8.8 mg/dL — ABNORMAL LOW (ref 8.9–10.3)
Creatinine, Ser: 2.18 mg/dL — ABNORMAL HIGH (ref 0.44–1.00)
GFR calc non Af Amer: 25 mL/min — ABNORMAL LOW (ref >60)
GFR, EST AFRICAN AMERICAN: 29 mL/min — AB (ref >60)
GLUCOSE: 224 mg/dL — AB (ref 70–99)
Potassium: 3.3 mmol/L — ABNORMAL LOW (ref 3.5–5.1)
Sodium: 146 mmol/L — ABNORMAL HIGH (ref 135–145)

## 2018-08-05 LAB — RENAL FUNCTION PANEL
Albumin: 2.2 g/dL — ABNORMAL LOW (ref 3.5–5.0)
Anion gap: 11 (ref 5–15)
BUN: 45 mg/dL — ABNORMAL HIGH (ref 6–20)
CALCIUM: 8.7 mg/dL — AB (ref 8.9–10.3)
CHLORIDE: 93 mmol/L — AB (ref 98–111)
CO2: 42 mmol/L — ABNORMAL HIGH (ref 22–32)
CREATININE: 2.43 mg/dL — AB (ref 0.44–1.00)
GFR, EST AFRICAN AMERICAN: 26 mL/min — AB (ref >60)
GFR, EST NON AFRICAN AMERICAN: 22 mL/min — AB (ref >60)
Glucose, Bld: 211 mg/dL — ABNORMAL HIGH (ref 70–99)
Phosphorus: 3.9 mg/dL (ref 2.5–4.6)
Potassium: 3 mmol/L — ABNORMAL LOW (ref 3.5–5.1)
SODIUM: 146 mmol/L — AB (ref 135–145)

## 2018-08-05 LAB — CBC
HCT: 27.8 % — ABNORMAL LOW (ref 36.0–46.0)
Hemoglobin: 8.4 g/dL — ABNORMAL LOW (ref 12.0–15.0)
MCH: 26.8 pg (ref 26.0–34.0)
MCHC: 30.2 g/dL (ref 30.0–36.0)
MCV: 88.5 fL (ref 78.0–100.0)
PLATELETS: 184 10*3/uL (ref 150–400)
RBC: 3.14 MIL/uL — AB (ref 3.87–5.11)
RDW: 15.7 % — ABNORMAL HIGH (ref 11.5–15.5)
WBC: 10.6 10*3/uL — AB (ref 4.0–10.5)

## 2018-08-05 LAB — GLUCOSE, CAPILLARY
GLUCOSE-CAPILLARY: 215 mg/dL — AB (ref 70–99)
GLUCOSE-CAPILLARY: 186 mg/dL — AB (ref 70–99)
Glucose-Capillary: 286 mg/dL — ABNORMAL HIGH (ref 70–99)
Glucose-Capillary: 238 mg/dL — ABNORMAL HIGH (ref 70–99)
Glucose-Capillary: 240 mg/dL — ABNORMAL HIGH (ref 70–99)
Glucose-Capillary: 214 mg/dL — ABNORMAL HIGH (ref 70–99)

## 2018-08-05 MED ORDER — SODIUM CHLORIDE 0.9 % IV SOLN
INTRAVENOUS | Status: DC
  Administered 2018-08-05 (×2): via INTRAVENOUS

## 2018-08-05 MED ORDER — POTASSIUM CHLORIDE 20 MEQ/15ML (10%) PO SOLN
40.0000 meq | Freq: Every day | ORAL | Status: DC
  Administered 2018-08-05 – 2018-08-06 (×2): 40 meq via ORAL
  Filled 2018-08-05 (×2): qty 30

## 2018-08-05 MED ORDER — TRAZODONE HCL 50 MG PO TABS
50.0000 mg | ORAL_TABLET | Freq: Every evening | ORAL | Status: DC | PRN
  Administered 2018-08-10: 50 mg via ORAL
  Filled 2018-08-05: qty 1

## 2018-08-05 MED ORDER — INSULIN DETEMIR 100 UNIT/ML ~~LOC~~ SOLN
24.0000 [IU] | Freq: Two times a day (BID) | SUBCUTANEOUS | Status: DC
  Administered 2018-08-05 – 2018-08-11 (×12): 24 [IU] via SUBCUTANEOUS
  Filled 2018-08-05 (×13): qty 0.24

## 2018-08-05 MED ORDER — FREE WATER: 200.0000 mL | Freq: Three times a day (TID) | Status: DC

## 2018-08-05 MED ORDER — POTASSIUM CHLORIDE CRYS ER 20 MEQ PO TBCR: 40.0000 meq | EXTENDED_RELEASE_TABLET | Freq: Once | ORAL | Status: DC

## 2018-08-05 MED ORDER — FREE WATER
200.0000 mL | Freq: Four times a day (QID) | Status: DC
  Administered 2018-08-05 – 2018-08-06 (×4): 200 mL

## 2018-08-05 NOTE — Progress Notes (Signed)
Inpatient Diabetes Program Recommendations  AACE/ADA: New Consensus Statement on Inpatient Glycemic Control (2015)  Target Ranges:  Prepandial:   less than 140 mg/dL      Peak postprandial:   less than 180 mg/dL (1-2 hours)      Critically ill patients:  140 - 180 mg/dL   Lab Results  Component Value Date   GLUCAP 215 (H) 08/05/2018   HGBA1C 16.3 (H) 08/01/2018    Review of Glycemic Control Results for [name], [name] (MRN [id number]) as of 08/05/2018 09:34  Ref. Range 08/04/2018 19:11 08/04/2018 23:20 08/05/2018 03:37 08/05/2018 07:34  Glucose-Capillary Latest Ref Range: 70 - 99 mg/dL 212 (H) 169 (H) 286 (H) 215 (H)    Diabetes history:Type 2 DM Outpatient Diabetes medications:none Current orders for Inpatient glycemic control:Novolog 0-20 units Q4H, Levemir 20 units BID, Novolog 7 units Q4H  Inpatient Diabetes Program Recommendations: Per rehab admission coordinator note on 8/7, plan for discussion with husband on Friday around 1000 am to discuss CIR details. With this meeting time, DM coordinator will plan to see patient and husband tomorrow following this discussion to help with discharge planning and DM teaching, pending plan.  Recommending an increase to Levemir to 24 units BID. In Md note from yesterday, but order was not updated. Will text page.   Thanks, Bronson Curb, MSN, RNC-OB Diabetes Coordinator [phone] (8a-5p)

## 2018-08-05 NOTE — Progress Notes (Addendum)
Admit: 07/24/2018 LOS: 12  20F with dialysis dependent AKI, nl baselien SCr after admission with hyperglycemia, acidosis  Subjective:  . No labs yet today . Yesterday labs with serum creatinine improved to 3.1 from 3.3, potassium is 3.0; serum bicarbonate 43 . Receiving enteral nutrition; some reports of drainage/gastric emptying/emesis, I wonder if this is the cause of the alkalosis . No significant edema on exam . Patient has been agitated and restless, requiring frequent redirection . Not all urine was quantified yesterday . Continues to have right temporary internal jugular hemodialysis catheter  08/07 0701 - 08/08 0700 In: 1465.9 [NG/GT:1465.9] Out: 350 [Urine:350]  Filed Weights   08/03/18 0500 08/04/18 0500 08/05/18 0423  Weight: 126.4 kg 126 kg 130.5 kg    Scheduled Meds: . chlorhexidine  15 mL Mouth Rinse BID  . divalproex  500 mg Oral QHS  . docusate  100 mg Oral Daily  . feeding supplement (PRO-STAT SUGAR FREE 64)  30 mL Per Tube BID  . free water  100 mL Per Tube Q6H  . Gerhardt's butt cream   Topical BID  . insulin aspart  0-20 Units Subcutaneous Q4H  . insulin aspart  7 Units Subcutaneous Q4H  . insulin detemir  24 Units Subcutaneous Q12H  . levothyroxine  125 mcg Per Tube QAC breakfast  . mouth rinse  15 mL Mouth Rinse BID  . pantoprazole sodium  40 mg Per Tube Daily  . risperiDONE  3 mg Per Tube QHS   Continuous Infusions: . sodium chloride Stopped (07/31/18 1531)  . sodium chloride Stopped (08/03/18 2057)  .  ceFAZolin (ANCEF) IV 1 g (08/04/18 1721)  . feeding supplement (VITAL 1.5 CAL) 55 mL/hr at 08/05/18 0600   PRN Meds:.sodium chloride, Place/Maintain arterial line **AND** sodium chloride, acetaminophen (TYLENOL) oral liquid 160 mg/5 mL, clonazePAM, ipratropium-albuterol, ondansetron (ZOFRAN) IV  Current Labs: reviewed    Physical Exam:  Blood pressure (!) 102/56, pulse (!) 106, temperature 98.8 F (37.1 C), temperature source Axillary, resp. rate  (!) 22, height 5\' 9"  (1.753 m), weight 130.5 kg, last menstrual period 09/18/2012, SpO2 93 %. NAD Temp HD cath R IJ Coarse BS diminished in bases Tachy, regular, nl s1s2 no rub No sig LEE  A 1. Dialysis dependent AKI, CRRT 8/1-8/5; some suggestion of recovery currently 2. Hyperglycemia/DKA, improved 3. Acidosis resolved 4. VDRF/AMS, improved 5. Schizophrenia 6. TCP, improving, not HIT 7. Anemia, no ESA given AKI with expected recovery  P . Follow-up later for today's labs.  I think she might be recovering function but I am uncertain at this time.   Marland Kitchen Keep hemodialysis catheter in place for now  . If alkalosis persists would provide gentle hydration with normal saline . Daily labs, Strict I/Os; reassess tomorrow prior to HD  ADDENDUM: Labs with further improvement in SCr, BUN stable. Will remove HD cath no HD needed. HCO3 is 42. WIll give NS 10mL x24h and see if helps  Pearson Grippe MD 08/05/2018, 11:49 AM  Recent Labs  Lab 08/02/18 0435 08/02/18 0437 08/03/18 0415 08/04/18 1022  NA 142 139 143 143  K 3.1* 3.1* 2.8* 3.0*  CL 87* 85* 88* 89*  CO2 42*  --  45* 43*  GLUCOSE 292* 287* 218* 263*  BUN 7 9 21* 43*  CREATININE 1.97* 1.70* 3.33* 3.07*  CALCIUM 8.9  --  8.5* 8.3*  PHOS 2.7  --  4.0 4.3   Recent Labs  Lab 08/02/18 0435 08/02/18 0437 08/03/18 0415 08/04/18 1022  WBC 9.2  --  10.7* 9.9  HGB 8.7* 9.2* 8.4* 8.1*  HCT 27.7* 27.0* 27.0* 26.6*  MCV 83.2  --  85.7 86.9  PLT 117*  --  144* 170

## 2018-08-05 NOTE — Progress Notes (Signed)
Nutrition Follow-up  DOCUMENTATION CODES:   Obesity unspecified  INTERVENTION:   Tube Feeding:  Continue Vital 1.5 @ 55 ml/hr Continue Pro-tat 30 mL BID Provides 2180 kcals, 120 g of protein  Increase free water to 200 mL q 6 hours Total free water: 1803 mL of free water  NUTRITION DIAGNOSIS:   Inadequate oral intake related to inability to eat as evidenced by NPO status.  Being addressed via TF   GOAL:   Patient will meet greater than or equal to 90% of their needs  Met  MONITOR:   Diet advancement, TF tolerance, Weight trends, Labs, I & O's  REASON FOR ASSESSMENT:   Ventilator    ASSESSMENT:   Patient with PMH HTN, lupus, goiter, paranoid schizophrenia, diabetes presented with AMS. Family states she was sick for 3 days and just lying on the couch. Blood sugar upon presentation was 1556, admitted for DKA, AKI.  Pt confused, trying to get OOB; 1:1 sitter for safety; psychiatry consulted, meds adjusted; pt with hx of schizophrenia  Off CRRT since 8/5, not received  HD at present  SLP following, failed FEES with severe aspiration risk, remains NPO Tolerating Vital 1.5 @ 55 ml/hr with Pro-Stat 30 mL BID via Cortrak tube Free water 100 mL q 6 hours; total free water 1403 mL  Sodium trending up, mild hypernatremia (sodium 146). Limited documentation of UOP, Creatinine improving. No IV hydration  Weight fluctuating but down overall from max recorded weight of 141.6 kg; current wt 130.5 kg  Labs: CBGs 169-286, potassium 3.0 (L), phopshorus wdl Meds: ss novolog, novolog q 4 hours, levemir   Diet Order:   Diet Order            Diet NPO time specified  Diet effective now              EDUCATION NEEDS:   Not appropriate for education at this time  Skin:  Skin Assessment: Reviewed RN Assessment(MSAD to L breast) Skin Integrity Issues:: Other (Comment) Other: MASD: breast, buttock;  Last BM:  8/8  Height:   Ht Readings from Last 1 Encounters:   07/24/18 5' 9"  (1.753 m)    Weight:   Wt Readings from Last 1 Encounters:  08/05/18 130.5 kg    Ideal Body Weight:  65.9 kg  BMI:  Body mass index is 42.49 kg/m.  Estimated Nutritional Needs:   Kcal:  2000-2300 kcals   Protein:  100-132 g  Fluid:  >/= 1.7 L  Kerman Passey MS, RD, LDN, CNSC [phone] Pager  [phone] Weekend/On-Call Pager

## 2018-08-05 NOTE — Progress Notes (Signed)
PROGRESS NOTE    [name]  TLX:[id number] DOB: [birth year] DOA: 07/24/2018 PCP: Celene Squibb, MD   Brief Narrative:  47 year old with history of hypertension, lupus, goiter, paranoid schizophrenia, diabetes went to [facility] ED with altered mental status. EMS called by family as she was sick for 3 days and lying on the couch. Patient had seizure activity with EMS and given 5 mg of Versed. Found to have hyperglycemia with blood sugars of 1556, anion gap acidosis, elevated lactic acid. Intubated for altered mental status in the ED and transferred to [facility]. There are no prior history of seizures. She had not been taking any of her other medications except psychiatric medications for the past 2 to 3 months.  Patient was admitted with severe DKA, intubated on admission in setting of acute metabolic encephalopathy. Patient develops fever the morning of July 30 th. She presented with acute on CKD failure with cr at 7.7. Prior cr per records 3 years ago was at 1.1. On July 31, renal function continue to worse, patient [facility] catheter placement. patient was started on CRRT. Patient was extubated 8-03. She develops mild post extubation stridor which resolve with racemic epi and decadron.   Today she appears comfortable, not in any kind of distress.  As per RN, she continues to have loose BM, .  Assessment & Plan:   Principal Problem:   Schizoaffective disorder, bipolar type ([place]) Active Problems:   DKA (diabetic ketoacidosis) ([place])   DKA (diabetic ketoacidoses) ([place])   Acute respiratory failure (H[facility])   AKI (acute kidney injury) ([place])   Altered mental status   Hypokalemia   Essential hypertension   Lupus ([place])   Poorly controlled type 2 diabetes mellitus (HCC)   Leukocytosis    Acute respiratory failure with hypoxia in the setting of acute metabolic encephalopathy, probably secondary to left basilar infiltrate with MSSA IN SPUTUM.  She was intubated on admission and  extubated on 8/3. CXR repeated and shows improvement.  Completed 10 day sof IV ancef.  [place] oxygen to keep sats greater than 90%.    Hypotension: Unclear etiology, was in DKA. Required pressors briefly and has been off pressors since 8/3.    DKA: Resolved.  A1c is around 16, and her DM is not well controlled.  CBG (last 3)  Recent Labs    08/05/18 0734 08/05/18 1137 08/05/18 1650  GLUCAP 215* 186* 238*   Increased the dose of levemir and SSI.     AKI with rising creatinine on admission with oliguria. She was started on CRRT.  Now HD on hold as her creatinine is improving.  Nephrology on board and appreciate their recommendations. Urine output at 350 yesterday, not sure if measured correctly.    Hypokalemia: replaced.    Nutrition;  She failed multiple SLP evaluations.  Currently she has feeding tube , Tube feeds running at 107ml/hr.  Recheck FEES by SLP in 24 to 48 hours.    Hypernatremia:  Possibly free water deficit, encourage free water via the tube.      Acute metabolic encephalopathy probably from AKI, DKA . Slightly improved but it appears she is not back to baseline.    H/o schizophrenia:  Unclear how compliant she was to her meds.  Psychiatry consulted for medications and she was started on depakote in addition to risperidone, klonopin and trazodone.  Cogentin,  on hold for now.     Hypothyroidism:  Resume home synthroid.   DVT prophylaxis: scd's Code Status:full code.  Family Communication: none at bedside.  Disposition Plan: pending clinical improvement.    Consultants:   Nephrology   Psychiatry.      Procedures:None.    Antimicrobials: ancef 10 days.    Subjective: Sleeping comfortably.   Objective: Vitals:   08/05/18 1200 08/05/18 1300 08/05/18 1500 08/05/18 1600  BP: 139/90 126/71 138/79 (!) 139/94  Pulse: 98 (!) 103 (!) 105 (!) 103  Resp: 18 19 16 19   Temp:      TempSrc:      SpO2: 94% 98% 96% 94%  Weight:        Height:        Intake/Output Summary (Last 24 hours) at 08/05/2018 1649 Last data filed at 08/05/2018 1400 Gross per 24 hour  Intake 1145 ml  Output -  Net 1145 ml   Filed Weights   08/03/18 0500 08/04/18 0500 08/05/18 0423  Weight: 126.4 kg 126 kg 130.5 kg    Examination:  General exam: Appears calm and comfortable  Respiratory system: Clear to auscultation. Respiratory effort normal. Cardiovascular system: S1 & S2 heard, RRR. No JVD,No pedal edema. Gastrointestinal system: Abdomen is nondistended, soft and nontender.. Normal bowel sounds heard. Central nervous system: Alert only oriented to person. Able to move all extremities.  Extremities: Symmetric 5 x 5 power. Skin: No rashes, lesions or ulcers Psychiatry: appears irritated.      Data Reviewed: I have personally reviewed following labs and imaging studies  CBC: Recent Labs  Lab 08/01/18 0446  08/02/18 0435 08/02/18 0437 08/03/18 0415 08/04/18 1022 08/05/18 0939  WBC 10.2  --  9.2  --  10.7* 9.9 10.6*  HGB 8.7*   < > 8.7* 9.2* 8.4* 8.1* 8.4*  HCT 27.6*   < > 27.7* 27.0* 27.0* 26.6* 27.8*  MCV 83.1  --  83.2  --  85.7 86.9 88.5  PLT 80*  --  117*  --  144* 170 184   < > = values in this interval not displayed.   Basic Metabolic Panel: Recent Labs  Lab 07/30/18 0600  07/31/18 0413  08/01/18 1620  08/02/18 0435 08/02/18 0437 08/03/18 0415 08/04/18 1022 08/05/18 1100  NA 141   < > 139   < > 140   < > 142 139 143 143 146*  K 3.7   < > 3.5   < > 3.2*   < > 3.1* 3.1* 2.8* 3.0* 3.0*  CL 104   < > 105   < > 89*   < > 87* 85* 88* 89* 93*  CO2 24   < > 25   < > 35*  --  42*  --  45* 43* 42*  GLUCOSE 146*   < > 165*   < > 249*   < > 292* 287* 218* 263* 211*  BUN 33*   < > 23*   < > 12   < > 7 9 21* 43* 45*  CREATININE 4.23*   < > 2.74*   < > 2.15*   < > 1.97* 1.70* 3.33* 3.07* 2.43*  CALCIUM 7.8*   < > 7.9*   < > 8.8*  --  8.9  --  8.5* 8.3* 8.7*  MG 2.4  --  2.4  --   --   --  1.7  --  2.2  --   --   PHOS 3.7    < > 3.2   < > 3.0  --  2.7  --  4.0 4.3 3.9   < > =  values in this interval not displayed.   GFR: Estimated Creatinine Clearance: 40.6 mL/min (A) (by C-G formula based on SCr of 2.43 mg/dL (H)). Liver Function Tests: Recent Labs  Lab 08/01/18 0446 08/01/18 1620 08/02/18 0435 08/04/18 1022 08/05/18 1100  ALBUMIN 1.5* 1.7* 1.7* 2.0* 2.2*   No results for input(s): LIPASE, AMYLASE in the last 168 hours. No results for input(s): AMMONIA in the last 168 hours. Coagulation Profile: Recent Labs  Lab 07/31/18 0806 08/02/18 0435  INR 1.20 1.14   Cardiac Enzymes: No results for input(s): CKTOTAL, CKMB, CKMBINDEX, TROPONINI in the last 168 hours. BNP (last 3 results) No results for input(s): PROBNP in the last 8760 hours. HbA1C: No results for input(s): HGBA1C in the last 72 hours. CBG: Recent Labs  Lab 08/04/18 1911 08/04/18 2320 08/05/18 0337 08/05/18 0734 08/05/18 1137  GLUCAP 212* 169* 286* 215* 186*   Lipid Profile: No results for input(s): CHOL, HDL, LDLCALC, TRIG, CHOLHDL, LDLDIRECT in the last 72 hours. Thyroid Function Tests: No results for input(s): TSH, T4TOTAL, FREET4, T3FREE, THYROIDAB in the last 72 hours. Anemia Panel: No results for input(s): VITAMINB12, FOLATE, FERRITIN, TIBC, IRON, RETICCTPCT in the last 72 hours. Sepsis Labs: No results for input(s): PROCALCITON, LATICACIDVEN in the last 168 hours.  Recent Results (from the past 240 hour(s))  Urine Culture     Status: Abnormal   Collection Time: 07/27/18  4:03 AM  Result Value Ref Range Status   Specimen Description URINE, CATHETERIZED  Final   Special Requests Normal  Final   Culture (A)  Final    >=100,000 COLONIES/mL LACTOBACILLUS SPECIES Standardized susceptibility testing for this organism is not available. Performed at [facility], [place] [address] [place], [place] [address]    Report Status 07/28/2018 FINAL  Final  Culture, blood (routine x 2)     Status: None   Collection Time:  07/27/18  5:25 AM  Result Value Ref Range Status   Specimen Description BLOOD RIGHT HAND  Final   Special Requests IN PEDIATRIC BOTTLE Blood Culture adequate volume  Final   Culture   Final    NO GROWTH 5 DAYS Performed at [facility], [place] [address] [place], [place] [address]    Report Status 08/01/2018 FINAL  Final  Culture, blood (routine x 2)     Status: None   Collection Time: 07/27/18  5:45 AM  Result Value Ref Range Status   Specimen Description BLOOD RIGHT HAND  Final   Special Requests   Final    BOTTLES DRAWN AEROBIC ONLY Blood Culture adequate volume   Culture   Final    NO GROWTH 5 DAYS Performed at [facility], [place] [address] [place], [place] [address]    Report Status 08/01/2018 FINAL  Final  Culture, respiratory (non-expectorated)     Status: None   Collection Time: 07/27/18 11:35 AM  Result Value Ref Range Status   Specimen Description TRACHEAL ASPIRATE  Final   Special Requests NONE  Final   Gram Stain   Final    ABUNDANT DEGENERATED CELLULAR MATERIAL PRESENT ABUNDANT GRAM POSITIVE COCCI IN PAIRS MODERATE GRAM VARIABLE ROD Performed at [facility], [address], [place], [place] [address]    Culture ABUNDANT STAPHYLOCOCCUS AUREUS  Final   Report Status 07/30/2018 FINAL  Final   Organism ID, Bacteria STAPHYLOCOCCUS AUREUS  Final      Susceptibility   Staphylococcus aureus - MIC*    CIPROFLOXACIN 1 SENSITIVE Sensitive  ERYTHROMYCIN >=8 RESISTANT Resistant     GENTAMICIN <=0.5 SENSITIVE Sensitive     OXACILLIN 0.5 SENSITIVE Sensitive     TETRACYCLINE <=1 SENSITIVE Sensitive     VANCOMYCIN 1 SENSITIVE Sensitive     TRIMETH/SULFA <=10 SENSITIVE Sensitive     CLINDAMYCIN RESISTANT Resistant     RIFAMPIN <=0.5 SENSITIVE Sensitive     Inducible Clindamycin POSITIVE Resistant     * ABUNDANT STAPHYLOCOCCUS AUREUS         Radiology Studies: Dg Chest Port 1 View  Result Date: 08/04/2018 CLINICAL DATA:  Hypoxia, diabetes  nonsmoker. EXAM: PORTABLE CHEST 1 VIEW COMPARISON:  Portable chest x-ray of August 01, 2018 FINDINGS: The lungs are mildly hypoinflated. The interstitial markings are coarse. The cardiac silhouette is enlarged but stable. The pulmonary vascularity is somewhat indistinct. There is no definite pleural effusion. The right internal jugular venous catheter tip projects over the junction of the proximal and middle thirds of the SVC. The feeding tube tip projects below the inferior margin of the image. IMPRESSION: Mild pulmonary hypoinflation results in some crowding of the lung markings. I cannot exclude low-grade CHF in the appropriate clinical setting. No alveolar pneumonia. Electronically Signed   By: David  Martinique M.D.   On: 08/04/2018 12:18        Scheduled Meds: . chlorhexidine  15 mL Mouth Rinse BID  . divalproex  500 mg Oral QHS  . docusate  100 mg Oral Daily  . feeding supplement (PRO-STAT SUGAR FREE 64)  30 mL Per Tube BID  . free water  200 mL Per Tube Q6H  . Gerhardt's butt cream   Topical BID  . insulin aspart  0-20 Units Subcutaneous Q4H  . insulin aspart  7 Units Subcutaneous Q4H  . insulin detemir  24 Units Subcutaneous Q12H  . levothyroxine  125 mcg Per Tube QAC breakfast  . mouth rinse  15 mL Mouth Rinse BID  . pantoprazole sodium  40 mg Per Tube Daily  . potassium chloride  40 mEq Oral Daily  . risperiDONE  3 mg Per Tube QHS   Continuous Infusions: . sodium chloride Stopped (07/31/18 1531)  . sodium chloride Stopped (08/03/18 2057)  . sodium chloride    .  ceFAZolin (ANCEF) IV 1 g (08/04/18 1721)  . feeding supplement (VITAL 1.5 CAL) 1,000 mL (08/05/18 1159)     LOS: 12 days    Time spent: 35 minutes.     Hosie Poisson, MD [facility] Pager [phone]  If 7PM-7AM, please contact night-coverage [email or web address] Password [email or web address] 08/05/2018, 4:49 PM

## 2018-08-05 NOTE — Consult Note (Addendum)
[facility] Consult   Reason for Consult:  Medication management  Referring Physician:  Dr. Karleen Hampshire Patient Identification: REMINGTON SKALSKY MRN:  [id number] Principal Diagnosis: Schizoaffective disorder, bipolar type [facility]) Diagnosis:   Patient Active Problem List   Diagnosis Date Noted  . Hypokalemia [E87.6]   . Essential hypertension [I10]   . Lupus ([place]) [M32.9]   . Poorly controlled type 2 diabetes mellitus ([place]) [E11.65]   . Leukocytosis [D72.829]   . Acute respiratory failure ([place]) [J96.00]   . AKI (acute kidney injury) ([place]) [N17.9]   . Altered mental status [R41.82]   . DKA (diabetic ketoacidosis) ([place]) [E13.10] 07/24/2018  . DKA (diabetic ketoacidoses) ([place]) [E13.10] 07/24/2018  . Paranoid schizophrenia ([place]) [F20.0] 07/20/2014  . 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 Contact dermatitis [L25.9] 01/18/2013  . Vaginitis [N76.0] 01/18/2013  . Essential hypertension, benign [I10] 12/24/2012  . Heartburn [R12] 12/24/2012  . Hypothyroidism [E03.9] 12/24/2012  . Lupus anticoagulant positive [R76.0] 10/02/2012  . Acute DVT (deep venous thrombosis) ([place]) [I82.409] 09/27/2012  . Chronic kidney disease [N18.9] 08/28/2012  . Fibroid uterus [D25.9] 06/12/2012    Total Time spent with patient: 1 hour  Subjective:   [name] is a 47 y.o. female patient admitted with hyperglycemia with anion gap acidosis.  HPI:  Per chart review, patient was admitted with hyperglycemia with a blood glucose of 1556. She was intubated for altered mental status. She was reportedly sick for 3 days and was witnessed to have seizure activity by EMS. She was given Versed 5 mg. She was extubated on 8/8. She was started on CRRT due to CKD with worsening renal functioning. She is also receiving antibiotics for pneumonia. She has a history of schizophrenia. She has not been taking her psychiatric medications for 2-3 months.  Home medications include Cogentin 0.5 mg BID, Klonopin 0.5 mg BID, Depakote 1000 mg qhs, Invega, Trazodone and monthly Invega 234 mg. PMP indicates one prescription filled for Ativan and Ambien in 08/2015.   Of note, patient was last admitted to [facility] in 08/2015 for decompensation in the setting of medication noncompliance. She was started on Depakote 1000 mg qhs and Risperdal 3 mg qhs. She received Kirt Boys prior to discharge. She was continued on Klonopin 0.5 mg BID for anxiety and Cogentin 0.5 mg BID. She was offered Trazodone 100 mg qhs and Ambien 5 mg qhs PRN for insomnia. She was scheduled follow up with [facility].   On interview, M.s Eshbach provides brief responses to questions. It is difficult to understand her at times due to hoarse and low voice. She reports that her mood has been "so so." She admits to feeling easily irritated. She reports a decline in her mood since running out of her medications. She   Past Psychiatric History: Schizoaffective disorder   Risk to Self:  None. Denies SI. Risk to Others:  None. Denies HI. Prior Inpatient Therapy:  She has a history of multiple inpatient psychiatric hospitalizations and was last admitted to [facility] in 08/2015 for decompensation in the setting of medication noncompliance.  Prior Outpatient Therapy:    Past Medical History:  Past Medical History:  Diagnosis Date  . Anemia   . 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 Delusional disorder ([place])   . DVT (deep venous thrombosis) ([place]) 09/30/12   in upper extremities b/l and LLE  . Fibroid tumor   . Graves disease   . 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 Arnoldo Morale on 12/13/2012.  Marland Kitchen Hypertension   . Lupus anticoagulant positive 10/02/2012  . Multinodular goiter 10/01/2012  . Paranoid schizophrenia ([place])   . Thyroid disease   . Thyroid nodule    s/p biopsy    Past Surgical History:  Procedure Laterality Date  . ABDOMINAL HYSTERECTOMY    .  HEMATOMA EVACUATION  10/03/2012   Procedure: EVACUATION HEMATOMA;  Surgeon: Jonnie Kind, MD;  Location: [facility];  Service: Gynecology;  Laterality: N/A;  . SUPRACERVICAL ABDOMINAL HYSTERECTOMY  09/27/2012   Procedure: HYSTERECTOMY SUPRACERVICAL ABDOMINAL;  Surgeon: Jonnie Kind, MD;  Location: [facility];  Service: Gynecology;  Laterality: N/A;  . THYROIDECTOMY  12/13/2012   Procedure: THYROIDECTOMY;  Surgeon: Jamesetta So, MD;  Location: [facility];  Service: General;  Laterality: Bilateral;   Family History:  Family History  Problem Relation Age of Onset  . Diabetes Mother   . Hypertension Mother   . Cancer Father        prostate   . Cancer Sister        pancreatic cancer   Family Psychiatric  History: Denies  Social History:  Social History   Substance and Sexual Activity  Alcohol Use No     Social History   Substance and Sexual Activity  Drug Use No    Social History   Socioeconomic History  . Marital status: Married    Spouse name: Not on file  . Number of children: Not on file  . Years of education: Not on file  . Highest education level: Not on file  Occupational History  . Not on file  Social Needs  . Financial resource strain: Not on file  . Food insecurity:    Worry: Not on file    Inability: Not on file  . Transportation needs:    Medical: Not on file    Non-medical: Not on file  Tobacco Use  . Smoking status: Never Smoker  . Smokeless tobacco: Never Used  Substance and Sexual Activity  . Alcohol use: No  . Drug use: No  . Sexual activity: Yes    Birth control/protection: Surgical  Lifestyle  . Physical activity:    Days per week: Not on file    Minutes per session: Not on file  . Stress: Not on file  Relationships  . Social connections:    Talks on phone: Not on file    Gets together: Not on file    Attends religious service: Not on file    Active member of club or organization: Not on file    Attends meetings of clubs or organizations: Not  on file    Relationship status: Not on file  Other Topics Concern  . Not on file  Social History Narrative  . Not on file   Additional Social History: She lives with her husband of 10 years. She does not have children. She is unemployed. She does not receive disability. She denies alcohol or illicit substance use.     Allergies:  No Known Allergies  Labs:  Results for orders placed or performed during the hospital encounter of 07/24/18 (from the past 48 hour(s))  Glucose, capillary     Status: Abnormal   Collection Time: 08/03/18  4:18 PM  Result Value Ref Range   Glucose-Capillary 212 (H) 70 - 99 mg/dL  Glucose, capillary     Status: Abnormal   Collection Time: 08/03/18  7:30 PM  Result Value Ref Range   Glucose-Capillary 219 (H) 70 - 99 mg/dL  Glucose, capillary     Status: Abnormal   Collection Time: 08/03/18 11:12 PM  Result Value Ref Range   Glucose-Capillary 211 (H) 70 - 99 mg/dL  Glucose, capillary     Status: Abnormal   Collection Time: 08/04/18  3:13 AM  Result Value Ref Range   Glucose-Capillary 218 (H) 70 - 99 mg/dL  Glucose, capillary     Status: Abnormal   Collection Time: 08/04/18  7:16 AM  Result Value Ref Range   Glucose-Capillary 205 (H) 70 - 99 mg/dL  Renal function panel     Status: Abnormal   Collection Time: 08/04/18 10:22 AM  Result Value Ref Range   Sodium 143 135 - 145 mmol/L   Potassium 3.0 (L) 3.5 - 5.1 mmol/L   Chloride 89 (L) 98 - 111 mmol/L   CO2 43 (H) 22 - 32 mmol/L   Glucose, Bld 263 (H) 70 - 99 mg/dL   BUN 43 (H) 6 - 20 mg/dL   Creatinine, Ser 3.07 (H) 0.44 - 1.00 mg/dL   Calcium 8.3 (L) 8.9 - 10.3 mg/dL   Phosphorus 4.3 2.5 - 4.6 mg/dL   Albumin 2.0 (L) 3.5 - 5.0 g/dL   GFR calc non Af Amer 17 (L) >60 mL/min   GFR calc Af Amer 19 (L) >60 mL/min    Comment: (NOTE) The eGFR has been calculated using the CKD EPI equation. This calculation has not been validated in all clinical situations. eGFR's persistently <60 mL/min signify  possible Chronic Kidney Disease.    Anion gap 11 5 - 15    Comment: Performed at [facility] [address] [place], [place] [address]  CBC     Status: Abnormal   Collection Time: 08/04/18 10:22 AM  Result Value Ref Range   WBC 9.9 4.0 - 10.5 K/uL   RBC 3.06 (L) 3.87 - 5.11 MIL/uL   Hemoglobin 8.1 (L) 12.0 - 15.0 g/dL   HCT 26.6 (L) 36.0 - 46.0 %   MCV 86.9 78.0 - 100.0 fL   MCH 26.5 26.0 - 34.0 pg   MCHC 30.5 30.0 - 36.0 g/dL   RDW 15.6 (H) 11.5 - 15.5 %   Platelets 170 150 - 400 K/uL    Comment: Performed at [facility], [place] [address], [place], [place] [address]  Glucose, capillary     Status: Abnormal   Collection Time: 08/04/18 11:36 AM  Result Value Ref Range   Glucose-Capillary 248 (H) 70 - 99 mg/dL  Glucose, capillary     Status: Abnormal   Collection Time: 08/04/18  3:25 PM  Result Value Ref Range   Glucose-Capillary 281 (H) 70 - 99 mg/dL  Glucose, capillary     Status: Abnormal   Collection Time: 08/04/18  7:11 PM  Result Value Ref Range   Glucose-Capillary 212 (H) 70 - 99 mg/dL  Glucose, capillary     Status: Abnormal   Collection Time: 08/04/18 11:20 PM  Result Value Ref Range   Glucose-Capillary 169 (H) 70 - 99 mg/dL  Glucose, capillary     Status: Abnormal   Collection Time: 08/05/18  3:37 AM  Result Value Ref Range   Glucose-Capillary 286 (H) 70 - 99 mg/dL  Glucose, capillary     Status: Abnormal   Collection Time: 08/05/18  7:34 AM  Result Value Ref Range   Glucose-Capillary 215 (H) 70 - 99 mg/dL  CBC  Status: Abnormal   Collection Time: 08/05/18  9:39 AM  Result Value Ref Range   WBC 10.6 (H) 4.0 - 10.5 K/uL   RBC 3.14 (L) 3.87 - 5.11 MIL/uL   Hemoglobin 8.4 (L) 12.0 - 15.0 g/dL   HCT 27.8 (L) 36.0 - 46.0 %   MCV 88.5 78.0 - 100.0 fL   MCH 26.8 26.0 - 34.0 pg   MCHC 30.2 30.0 - 36.0 g/dL   RDW 15.7 (H) 11.5 - 15.5 %   Platelets 184 150 - 400 K/uL    Comment: Performed at [facility] [address] [place], [place]  [address]  Glucose, capillary     Status: Abnormal   Collection Time: 08/05/18 11:37 AM  Result Value Ref Range   Glucose-Capillary 186 (H) 70 - 99 mg/dL    Current Facility-Administered Medications  Medication Dose Route Frequency Provider Last Rate Last Dose  . 0.9 %  sodium chloride infusion  250 mL Intravenous PRN Parrett, Fonnie Mu, NP   Stopped at 07/31/18 1531  . 0.9 %  sodium chloride infusion   Intra-arterial PRN Parrett, Fonnie Mu, NP   Stopped at 08/03/18 2057  . acetaminophen (TYLENOL) solution 650 mg  650 mg Per Tube Q6H PRN Parrett, Tammy S, NP   650 mg at 07/28/18 0358  . ceFAZolin (ANCEF) IVPB 1 g/50 mL premix  1 g Intravenous Q24H Sloan Leiter B, RPH 100 mL/hr at 08/04/18 1721 1 g at 08/04/18 1721  . chlorhexidine (PERIDEX) 0.12 % solution 15 mL  15 mL Mouth Rinse BID Parrett, Tammy S, NP   15 mL at 08/05/18 0942  . clonazePAM (KLONOPIN) tablet 0.5 mg  0.5 mg Oral BID PRN Parrett, Tammy S, NP   0.5 mg at 08/05/18 1012  . divalproex (DEPAKOTE SPRINKLE) capsule 500 mg  500 mg Oral QHS Regalado, Belkys A, MD   500 mg at 08/04/18 2256  . docusate (COLACE) 50 MG/5ML liquid 100 mg  100 mg Oral Daily Parrett, Tammy S, NP   100 mg at 08/03/18 1012  . feeding supplement (PRO-STAT SUGAR FREE 64) liquid 30 mL  30 mL Per Tube BID Rush Farmer, MD   30 mL at 08/05/18 0917  . feeding supplement (VITAL 1.5 CAL) liquid 1,000 mL  1,000 mL Per Tube Continuous Parrett, Tammy S, NP 55 mL/hr at 08/05/18 1159 1,000 mL at 08/05/18 1159  . free water 100 mL  100 mL Per Tube Q6H Parrett, Tammy S, NP   100 mL at 08/05/18 0917  . Gerhardt's butt cream   Topical BID Parrett, Tammy S, NP   1 application at 31/54/00 0943  . insulin aspart (novoLOG) injection 0-20 Units  0-20 Units Subcutaneous Q4H Parrett, Tammy S, NP   4 Units at 08/05/18 1156  . insulin aspart (novoLOG) injection 7 Units  7 Units Subcutaneous Q4H Parrett, Tammy S, NP   7 Units at 08/05/18 1157  . insulin detemir (LEVEMIR) injection 24  Units  24 Units Subcutaneous Q12H Akula, Vijaya, MD      . ipratropium-albuterol (DUONEB) 0.5-2.5 (3) MG/3ML nebulizer solution 3 mL  3 mL Nebulization Q4H PRN Parrett, Tammy S, NP      . levothyroxine (SYNTHROID, LEVOTHROID) tablet 125 mcg  125 mcg Per Tube QAC breakfast Parrett, Tammy S, NP   125 mcg at 08/05/18 0732  . MEDLINE mouth rinse  15 mL Mouth Rinse BID Regalado, Belkys A, MD   15 mL at 08/05/18 1016  . ondansetron (ZOFRAN)  injection 4 mg  4 mg Intravenous Q6H PRN Parrett, Tammy S, NP   4 mg at 08/01/18 0955  . pantoprazole sodium (PROTONIX) 40 mg/20 mL oral suspension 40 mg  40 mg Per Tube Daily Parrett, Tammy S, NP   40 mg at 08/05/18 0917  . risperiDONE (RISPERDAL) 1 MG/ML oral solution 3 mg  3 mg Per Tube QHS Rush Farmer, MD   3 mg at 08/04/18 2256    Musculoskeletal: Strength & Muscle Tone: decreased due to physical deconditioning.  Gait & Station: unable to stand Patient leans: N/A  Psychiatric Specialty Exam: Physical Exam  Nursing note and vitals reviewed. Constitutional: She is oriented to person, place, and time. She appears well-developed and well-nourished.  HENT:  Head: Normocephalic and atraumatic.  Neck: Normal range of motion.  Respiratory: Effort normal.  Musculoskeletal: Normal range of motion.  Neurological: She is alert and oriented to person, place, and time.  Skin: No rash noted.  Psychiatric: Her speech is normal and behavior is normal. Judgment and thought content normal. Cognition and memory are normal. She exhibits a depressed mood.    Review of Systems  Constitutional: Negative for chills and fever.  Cardiovascular: Negative for chest pain.  Gastrointestinal: Negative for abdominal pain, constipation, diarrhea, nausea and vomiting.  Psychiatric/Behavioral: Positive for depression. Negative for hallucinations, substance abuse and suicidal ideas. The patient does not have insomnia.   All other systems reviewed and are negative.   Blood  pressure (!) 102/56, pulse (!) 106, temperature 98.8 F (37.1 C), temperature source Axillary, resp. rate (!) 22, height 5' 9"  (1.753 m), weight 130.5 kg, last menstrual period 09/18/2012, SpO2 93 %.Body mass index is 42.49 kg/m.  General Appearance: Disheveled, morbidly obese, middle aged, African American female, wearing a hospital gown with matted hair and lying in bed. NAD.   Eye Contact:  Good  Speech:  Clear and Coherent and Normal Rate  Volume:  Decreased  Mood:  "So so"  Affect:  Constricted  Thought Process:  Goal Directed, Linear and Descriptions of Associations: Intact  Orientation:  Full (Time, Place, and Person)  Thought Content:  Logical  Suicidal Thoughts:  No  Homicidal Thoughts:  No  Memory:  Immediate;   Fair Recent;   Fair Remote;   Fair  Judgement:  Fair  Insight:  Fair  Psychomotor Activity:  Decreased  Concentration:  Concentration: Good and Attention Span: Good  Recall:  [employer] of Knowledge:  Fair  Language:  Fair  Akathisia:  No  Handed:  Right  AIMS (if indicated):   N/A  Assets:  Communication Skills Desire for Improvement Financial Resources/Insurance Housing Intimacy Social Support  ADL's:  Impaired  Cognition:  WNL  Sleep:   Okay   Assessment:  [name] is a 46 y.o. female who was admitted with hyperglycemia with anion gap acidosis. Patient is appropriate in behavior and well organized in thought process. She denies SI, HI or AVH. She reports a decline in her mood since running out of her medications. Recommend restarting Depakote and Invega for mood stabilization. She does not warrant inpatient psychiatric hospitalization at this time.   Treatment Plan Summary: -Restart Depakote 500 mg qhs for mood stabilization. If only partially effective increase to home dose of 1000 mg qhs. -Will wait to confirm with husband if patient still receives monthly Invega injection.  -Restart Trazodone 50 mg qhs PRN for insomnia.  -EKG reviewed and  QTc 408 on 7/27. Please closely monitor when starting or  increasing QTc prolonging agents.  -Psychiatry will sign off on patient at this time. Please consult psychiatry again as needed.    Disposition: No evidence of imminent risk to self or others at present.   Patient does not meet criteria for psychiatric inpatient admission.  Faythe Dingwall, DO 08/05/2018 12:22 PM

## 2018-08-06 DIAGNOSIS — G40901 Epilepsy, unspecified, not intractable, with status epilepticus: Secondary | ICD-10-CM

## 2018-08-06 LAB — CBC
HEMATOCRIT: 27.8 % — AB (ref 36.0–46.0)
Hemoglobin: 8.2 g/dL — ABNORMAL LOW (ref 12.0–15.0)
MCH: 26 pg (ref 26.0–34.0)
MCHC: 29.5 g/dL — ABNORMAL LOW (ref 30.0–36.0)
MCV: 88.3 fL (ref 78.0–100.0)
Platelets: 194 10*3/uL (ref 150–400)
RBC: 3.15 MIL/uL — AB (ref 3.87–5.11)
RDW: 15.6 % — ABNORMAL HIGH (ref 11.5–15.5)
WBC: 7.9 10*3/uL (ref 4.0–10.5)

## 2018-08-06 LAB — BASIC METABOLIC PANEL
ANION GAP: 9 (ref 5–15)
BUN: 34 mg/dL — AB (ref 6–20)
CHLORIDE: 102 mmol/L (ref 98–111)
CO2: 35 mmol/L — ABNORMAL HIGH (ref 22–32)
Calcium: 9 mg/dL (ref 8.9–10.3)
Creatinine, Ser: 1.79 mg/dL — ABNORMAL HIGH (ref 0.44–1.00)
GFR calc Af Amer: 37 mL/min — ABNORMAL LOW (ref >60)
GFR calc non Af Amer: 32 mL/min — ABNORMAL LOW (ref >60)
GLUCOSE: 281 mg/dL — AB (ref 70–99)
POTASSIUM: 3.4 mmol/L — AB (ref 3.5–5.1)
Sodium: 146 mmol/L — ABNORMAL HIGH (ref 135–145)

## 2018-08-06 LAB — RENAL FUNCTION PANEL
Albumin: 2.2 g/dL — ABNORMAL LOW (ref 3.5–5.0)
Anion gap: 12 (ref 5–15)
BUN: 41 mg/dL — ABNORMAL HIGH (ref 6–20)
CO2: 39 mmol/L — ABNORMAL HIGH (ref 22–32)
CREATININE: 1.97 mg/dL — AB (ref 0.44–1.00)
Calcium: 8.8 mg/dL — ABNORMAL LOW (ref 8.9–10.3)
Chloride: 97 mmol/L — ABNORMAL LOW (ref 98–111)
GFR calc non Af Amer: 29 mL/min — ABNORMAL LOW (ref >60)
GFR, EST AFRICAN AMERICAN: 33 mL/min — AB (ref >60)
Glucose, Bld: 202 mg/dL — ABNORMAL HIGH (ref 70–99)
POTASSIUM: 3.2 mmol/L — AB (ref 3.5–5.1)
Phosphorus: 4 mg/dL (ref 2.5–4.6)
Sodium: 148 mmol/L — ABNORMAL HIGH (ref 135–145)

## 2018-08-06 LAB — GLUCOSE, CAPILLARY
GLUCOSE-CAPILLARY: 195 mg/dL — AB (ref 70–99)
GLUCOSE-CAPILLARY: 197 mg/dL — AB (ref 70–99)
GLUCOSE-CAPILLARY: 90 mg/dL (ref 70–99)
Glucose-Capillary: 164 mg/dL — ABNORMAL HIGH (ref 70–99)
Glucose-Capillary: 177 mg/dL — ABNORMAL HIGH (ref 70–99)
Glucose-Capillary: 278 mg/dL — ABNORMAL HIGH (ref 70–99)

## 2018-08-06 MED ORDER — INSULIN ASPART 100 UNIT/ML ~~LOC~~ SOLN
0.0000 [IU] | Freq: Three times a day (TID) | SUBCUTANEOUS | Status: DC
  Administered 2018-08-07: 3 [IU] via SUBCUTANEOUS
  Administered 2018-08-07: 11 [IU] via SUBCUTANEOUS
  Administered 2018-08-07: 4 [IU] via SUBCUTANEOUS
  Administered 2018-08-08: 7 [IU] via SUBCUTANEOUS
  Administered 2018-08-08: 3 [IU] via SUBCUTANEOUS
  Administered 2018-08-08: 4 [IU] via SUBCUTANEOUS
  Administered 2018-08-09: 7 [IU] via SUBCUTANEOUS
  Administered 2018-08-09 – 2018-08-10 (×2): 4 [IU] via SUBCUTANEOUS
  Administered 2018-08-10: 3 [IU] via SUBCUTANEOUS
  Administered 2018-08-11: 11 [IU] via SUBCUTANEOUS

## 2018-08-06 MED ORDER — RESOURCE THICKENUP CLEAR PO POWD
ORAL | Status: DC | PRN
  Filled 2018-08-06 (×3): qty 125

## 2018-08-06 NOTE — Progress Notes (Signed)
Occupational Therapy Treatment Patient Details Name: ZOI DEVINE MRN: [id number] DOB: [birth year] Today's Date: 08/06/2018    History of present illness Pt is a 47 y.o. female with PMH of HTN, lupus, goiter, paranoid schizophrenia, and DM, admitted to [facility] ED with AMS. Found to have hyperglycemia with blood sugars of 1556, anion gap acidosis, elevated lactic acid. ETT 7/27-8/3.    OT comments  This 47 yo female admitted with above seen today with PT to address mobility and ADLs. Pt remains at same level as last session needing Min-Mod A for basic ADLs with +2 needed when up on feet due to decreased safety awareness and decreased balance. She will continue to benefit from acute OT with follow up at CIR to work towards a S level.   Follow Up Recommendations  CIR;Supervision/Assistance - 24 hour    Equipment Recommendations  3 in 1 bedside commode;Other (comment)(RW)       Precautions / Restrictions Precautions Precautions: Fall Precaution Comments: safety monitor Restrictions Weight Bearing Restrictions: No       Mobility Bed Mobility Overal bed mobility: Needs Assistance Bed Mobility: Supine to Sit     Supine to sit: Supervision        Transfers Overall transfer level: Needs assistance Equipment used: 2 person hand held assist Transfers: Sit to/from Stand Sit to Stand: Mod assist         General transfer comment: Mod +2 with RW or BIL HHA for ambulation    Balance Overall balance assessment: Needs assistance Sitting-balance support: No upper extremity supported;Feet supported Sitting balance-Leahy Scale: Good     Standing balance support: Bilateral upper extremity supported Standing balance-Leahy Scale: Poor Standing balance comment: Reliant on UE support and external assist to maintain static balance                           ADL either performed or assessed with clinical judgement   ADL Overall ADL's : Needs assistance/impaired      Grooming: Oral care;Supervision/safety;Set up Grooming Details (indicate cue type and reason): Able to take swab about move it about her mouth             Lower Body Dressing: Moderate assistance Lower Body Dressing Details (indicate cue type and reason): don socks circle sitting in bed (said she could not get them started)     Toileting- Clothing Manipulation and Hygiene: Total assistance Toileting - Clothing Manipulation Details (indicate cue type and reason): Mod A sit<>stand             Vision Baseline Vision/History: Wears glasses Wears Glasses: Reading only            Cognition Arousal/Alertness: Awake/alert Behavior During Therapy: Flat affect Overall Cognitive Status: No family/caregiver present to determine baseline cognitive functioning                                 General Comments: Max encouragement to vocalize words versus nodding/motioning; voice is low quality. When asked pt where she was she said, "I already told you" when told her I had not heard her could she tell me again, she did not respond. Wanted to call her husband but unable to recall the full number. Greatly distracted with wanting something to drink. Decreased safety awareness                   Pertinent Vitals/ Pain  Pain Assessment: No/denies pain     Prior Functioning/Environment              Frequency  Min 2X/week        Progress Toward Goals  OT Goals(current goals can now be found in the care plan section)  Progress towards OT goals: Not progressing toward goals - comment(remains at same level as last session, decreased safety awareness and needing encouragement to do more)     Plan Discharge plan remains appropriate    Co-evaluation    PT/OT/SLP Co-Evaluation/Treatment: Yes Reason for Co-Treatment: Complexity of the patient's impairments (multi-system involvement);For patient/therapist safety;To address functional/ADL transfers   OT goals  addressed during session: ADL's and self-care;Strengthening/ROM      AM-PAC PT "6 Clicks" Daily Activity     Outcome Measure   Help from another person eating meals?: Total(NPO) Help from another person taking care of personal grooming?: A Little Help from another person toileting, which includes using toliet, bedpan, or urinal?: A Lot Help from another person bathing (including washing, rinsing, drying)?: A Little Help from another person to put on and taking off regular upper body clothing?: A Little Help from another person to put on and taking off regular lower body clothing?: A Lot 6 Click Score: 14    End of Session Equipment Utilized During Treatment: Gait belt;Rolling walker  OT Visit Diagnosis: Unsteadiness on feet (R26.81);Other abnormalities of gait and mobility (R26.89);Muscle weakness (generalized) (M62.81);Other symptoms and signs involving cognitive function   Activity Tolerance Patient tolerated treatment well   Patient Left in chair;with call bell/phone within reach;with chair alarm set   Nurse Communication Mobility status        Time: 5520-8022 OT Time Calculation (min): 27 min  Charges: OT General Charges $OT Visit: 1 Visit OT Treatments $Self Care/Home Management : 8-22 mins  [place], OTR/L [phone] 08/06/2018

## 2018-08-06 NOTE — Progress Notes (Signed)
Pt arrived to unit by bed with safety sitter by side, pt alert and oriented X 4 on 2 L [place] of O2. Pt denied pain and discomfort. Bowel sound active, ronchi on upper lung. Pt have a strong non productive cough. Abrasion under left breast, an opening on right bottom chick and foam dressing on right heel. Pt calm and follow command. Will continue to monitor for any change.

## 2018-08-06 NOTE — Progress Notes (Signed)
Inpatient Rehabilitation-Admissions Coordinator   Spoke with pt's husband who confirmed no additional caregiver support. Pt will not have the recommended 24/7 Support needed for consideration of CIR. AC has relayed need for new dispo plan to SW. [facility] will sign off. Please call if questions.   Jhonnie Garner, OTR/L  Rehab Admissions Coordinator  [phone] 08/06/2018 3:16 PM

## 2018-08-06 NOTE — Progress Notes (Signed)
S: "I'm thirsty" O:BP (!) 145/94 (BP Location: Right Arm)   Pulse 84   Temp 98.6 F (37 C) (Oral)   Resp 20   Ht 5\' 9"  (1.753 m)   Wt 130.4 kg   LMP 09/18/2012   SpO2 94%   BMI 42.45 kg/m   Intake/Output Summary (Last 24 hours) at 08/06/2018 1732 Last data filed at 08/06/2018 1717 Gross per 24 hour  Intake 1612.3 ml  Output 750 ml  Net 862.3 ml   Intake/Output: I/O last 3 completed shifts: In: 2204.3 [I.V.:639.3; NG/GT:1565] Out: 1050 [Urine:1050]  Intake/Output this shift:  Total I/O In: 718 [P.O.:240; I.V.:478] Out: -  Weight change: -0.1 kg Gen: Obese AAF in  NAD CVS: no rub Resp: cta Abd: obese, +BS, soft, NT Ext: no edema  Recent Labs  Lab 07/31/18 1719  08/01/18 0446  08/01/18 1620  08/02/18 0435 08/02/18 0437 08/03/18 0415 08/04/18 1022 08/05/18 1100 08/05/18 2151 08/06/18 0546  NA 141   < > 138   < > 140   < > 142 139 143 143 146* 146* 148*  K 3.5   < > 2.9*   < > 3.2*   < > 3.1* 3.1* 2.8* 3.0* 3.0* 3.3* 3.2*  CL 102  --  93*  --  89*   < > 87* 85* 88* 89* 93* 97* 97*  CO2 28  --  32  --  35*  --  42*  --  45* 43* 42* 40* 39*  GLUCOSE 125*  --  203*  --  249*   < > 292* 287* 218* 263* 211* 224* 202*  BUN 20  --  17  --  12   < > 7 9 21* 43* 45* 43* 41*  CREATININE 2.24*  --  2.35*  --  2.15*   < > 1.97* 1.70* 3.33* 3.07* 2.43* 2.18* 1.97*  ALBUMIN 1.5*  --  1.5*  --  1.7*  --  1.7*  --   --  2.0* 2.2*  --  2.2*  CALCIUM 8.6*  --  8.5*  --  8.8*  --  8.9  --  8.5* 8.3* 8.7* 8.8* 8.8*  PHOS 3.0  --  2.9  --  3.0  --  2.7  --  4.0 4.3 3.9  --  4.0   < > = values in this interval not displayed.   Liver Function Tests: Recent Labs  Lab 08/04/18 1022 08/05/18 1100 08/06/18 0546  ALBUMIN 2.0* 2.2* 2.2*   No results for input(s): LIPASE, AMYLASE in the last 168 hours. No results for input(s): AMMONIA in the last 168 hours. CBC: Recent Labs  Lab 08/02/18 0435  08/03/18 0415 08/04/18 1022 08/05/18 0939 08/06/18 0546  WBC 9.2  --  10.7* 9.9 10.6*  7.9  HGB 8.7*   < > 8.4* 8.1* 8.4* 8.2*  HCT 27.7*   < > 27.0* 26.6* 27.8* 27.8*  MCV 83.2  --  85.7 86.9 88.5 88.3  PLT 117*  --  144* 170 184 194   < > = values in this interval not displayed.   Cardiac Enzymes: No results for input(s): CKTOTAL, CKMB, CKMBINDEX, TROPONINI in the last 168 hours. CBG: Recent Labs  Lab 08/06/18 0011 08/06/18 0449 08/06/18 0801 08/06/18 1212 08/06/18 1624  GLUCAP 164* 177* 195* 197* 90    Iron Studies: No results for input(s): IRON, TIBC, TRANSFERRIN, FERRITIN in the last 72 hours. Studies/Results: No results found. . chlorhexidine  15 mL Mouth Rinse BID  .  divalproex  500 mg Oral QHS  . docusate  100 mg Oral Daily  . feeding supplement (PRO-STAT SUGAR FREE 64)  30 mL Per Tube BID  . Gerhardt's butt cream   Topical BID  . insulin aspart  0-20 Units Subcutaneous TID WC  . insulin detemir  24 Units Subcutaneous Q12H  . levothyroxine  125 mcg Per Tube QAC breakfast  . mouth rinse  15 mL Mouth Rinse BID  . pantoprazole sodium  40 mg Per Tube Daily  . potassium chloride  40 mEq Oral Daily  . risperiDONE  3 mg Per Tube QHS    BMET    Component Value Date/Time   NA 148 (H) 08/06/2018 0546   K 3.2 (L) 08/06/2018 0546   CL 97 (L) 08/06/2018 0546   CO2 39 (H) 08/06/2018 0546   GLUCOSE 202 (H) 08/06/2018 0546   BUN 41 (H) 08/06/2018 0546   CREATININE 1.97 (H) 08/06/2018 0546   CREATININE 0.84 10/22/2012 1455   CALCIUM 8.8 (L) 08/06/2018 0546   GFRNONAA 29 (L) 08/06/2018 0546   GFRNONAA 85 10/22/2012 1455   GFRAA 33 (L) 08/06/2018 0546   GFRAA >89 10/22/2012 1455   CBC    Component Value Date/Time   WBC 7.9 08/06/2018 0546   RBC 3.15 (L) 08/06/2018 0546   HGB 8.2 (L) 08/06/2018 0546   HCT 27.8 (L) 08/06/2018 0546   PLT 194 08/06/2018 0546   MCV 88.3 08/06/2018 0546   MCH 26.0 08/06/2018 0546   MCHC 29.5 (L) 08/06/2018 0546   RDW 15.6 (H) 08/06/2018 0546   LYMPHSABS 1.2 07/24/2018 1704   MONOABS 0.4 07/24/2018 1704   EOSABS 0.1  07/24/2018 1704   BASOSABS 0.1 07/24/2018 1704     Assessment/Plan:  1. AKI- requiring CVVHD from 8/1-8/5 in setting of DKA and severe hypovolemia.  Thankfully she has recovered renal function and temp HD catheter has been removed 2. Hypernatremia- will need free water boluses via NGT until she is cleared to take po.  Continue to follow 3. DKA- improved/resolved 4. Hypokalemia- continue to replete and follow 5. Acute metabolic encephalopathy- improved 6. H/o schizophrenia- continue with meds 7. Hypothyroidism 8. Anemia of critical illness- transfuse prn.  Donetta Potts, MD [employer] [phone]

## 2018-08-06 NOTE — Procedures (Signed)
Objective Swallowing Evaluation: Type of Study: FEES-Fiberoptic Endoscopic Evaluation of Swallow   Patient Details  Name: [name] MRN: [id number] Date of Birth: 16-Oct-1971  Today's Date: 08/06/2018 Time: SLP Start Time (ACUTE ONLY): 1202 -SLP Stop Time (ACUTE ONLY): 1227  SLP Time Calculation (min) (ACUTE ONLY): 25 min   Past Medical History:  Past Medical History:  Diagnosis Date  . Anemia   . 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 Delusional disorder ([place])   . DVT (deep venous thrombosis) ([place]) 09/30/12   in upper extremities b/l and LLE  . Fibroid tumor   . Graves disease   . 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 Arnoldo Morale on 12/13/2012.  Mar[place] Kitchen Hypertension   . Lupus anticoagulant positive 10/02/2012  . Multinodular goiter 10/01/2012  . Paranoid schizophrenia ([place])   . Thyroid disease   . Thyroid nodule    s/p biopsy   Past Surgical History:  Past Surgical History:  Procedure Laterality Date  . ABDOMINAL HYSTERECTOMY    . HEMATOMA EVACUATION  10/03/2012   Procedure: EVACUATION HEMATOMA;  Surgeon: Jonnie Kind, MD;  Location: [facility];  Service: Gynecology;  Laterality: N/A;  . SUPRACERVICAL ABDOMINAL HYSTERECTOMY  09/27/2012   Procedure: HYSTERECTOMY SUPRACERVICAL ABDOMINAL;  Surgeon: Jonnie Kind, MD;  Location: [facility];  Service: Gynecology;  Laterality: N/A;  . THYROIDECTOMY  12/13/2012   Procedure: THYROIDECTOMY;  Surgeon: Jamesetta So, MD;  Location: [facility];  Service: General;  Laterality: Bilateral;   HPI: Pt is a 47 y.o. female with PMH of HTN, lupus, goiter, paranoid schizophrenia, graves disease, and DM, admitted to [facility] ED with AMS. Found to have hyperglycemia with blood sugars of 1556, anion gap acidosis, elevated lactic acid. ETT 7/27-8/3.     Subjective: eager for POs    Assessment / Plan / Recommendation  CHL IP CLINICAL IMPRESSIONS 08/06/2018  Clinical Impression Pt shows mild improvements since  recent FEES, although with enough improvement that she can consume spoonfuls of purees and honey thick liquids with adequate airway protection. Her oral phase in general remains mildly slow and with decreased bolus cohesion, but she has less piecemeal swallowing to clear boluses (typically 2 swallows to clear purees from her oral cavity). Pharyngeal residue is mild. With larger volumes of honey thick liquids and anything thinner (nectar thick liquids, ice chips tested), she still does not have appropriate timing and airway closure to protect her airway. She had penetration to the vocal folds but without aspiration observed today, and penetration occurs without spontaneous attempts to clear. Coughing observed after the scope was removed is concerning for eventual aspiration of penetrated thin liquids that pt could not fully clear. Recommend initiating Dys 1 diet and honey thick liquids by spoon only. Pt will likely need full supervision due to impulsivity. If coughing, would hold POs as this is more than likely related to aspiration. Will continue to follow with good prognosis for further advancements.  SLP Visit Diagnosis Dysphagia, oropharyngeal phase (R13.12)  Attention and concentration deficit following --  Frontal lobe and executive function deficit following --  Impact on safety and function Moderate aspiration risk      CHL IP TREATMENT RECOMMENDATION 08/06/2018  Treatment Recommendations Therapy as outlined in treatment plan below     Prognosis 08/06/2018  Prognosis for Safe Diet Advancement Good  Barriers to Reach Goals --  Barriers/Prognosis Comment --    CHL IP DIET RECOMMENDATION 08/06/2018  SLP Diet Recommendations Dysphagia  1 (Puree) solids;Honey thick liquids  Liquid Administration via Spoon  Medication Administration Crushed with puree  Compensations Minimize environmental distractions;Slow rate;Small sips/bites;Clear throat intermittently  Postural Changes Seated upright at 90 degrees       CHL IP OTHER RECOMMENDATIONS 08/06/2018  Recommended Consults --  Oral Care Recommendations Oral care BID  Other Recommendations --      CHL IP FOLLOW UP RECOMMENDATIONS 08/06/2018  Follow up Recommendations Inpatient Rehab      CHL IP FREQUENCY AND DURATION 08/06/2018  Speech Therapy Frequency (ACUTE ONLY) min 2x/week  Treatment Duration 2 weeks           CHL IP ORAL PHASE 08/06/2018  Oral Phase Impaired  Oral - Pudding Teaspoon --  Oral - Pudding Cup --  Oral - Honey Teaspoon Decreased bolus cohesion  Oral - Honey Cup Decreased bolus cohesion;Premature spillage  Oral - Nectar Teaspoon Decreased bolus cohesion;Premature spillage  Oral - Nectar Cup --  Oral - Nectar Straw --  Oral - Thin Teaspoon Premature spillage;Decreased bolus cohesion  Oral - Thin Cup --  Oral - Thin Straw --  Oral - Puree Delayed oral transit;Piecemeal swallowing;Decreased bolus cohesion  Oral - Mech Soft --  Oral - Regular --  Oral - Multi-Consistency --  Oral - Pill --  Oral Phase - Comment --    CHL IP PHARYNGEAL PHASE 08/06/2018  Pharyngeal Phase Impaired  Pharyngeal- Pudding Teaspoon --  Pharyngeal --  Pharyngeal- Pudding Cup --  Pharyngeal --  Pharyngeal- Honey Teaspoon Delayed swallow initiation-vallecula;Reduced pharyngeal peristalsis;Reduced epiglottic inversion;Reduced anterior laryngeal mobility;Reduced laryngeal elevation;Reduced airway/laryngeal closure;Reduced tongue base retraction  Pharyngeal Material does not enter airway  Pharyngeal- Honey Cup Reduced pharyngeal peristalsis;Reduced epiglottic inversion;Reduced anterior laryngeal mobility;Reduced laryngeal elevation;Reduced airway/laryngeal closure;Reduced tongue base retraction;Delayed swallow initiation-pyriform sinuses;Penetration/Aspiration during swallow;Pharyngeal residue - valleculae;Pharyngeal residue - pyriform  Pharyngeal Material enters airway, CONTACTS cords and not ejected out  Pharyngeal- Nectar Teaspoon Reduced  pharyngeal peristalsis;Reduced epiglottic inversion;Reduced anterior laryngeal mobility;Reduced laryngeal elevation;Reduced airway/laryngeal closure;Reduced tongue base retraction;Delayed swallow initiation-pyriform sinuses;Penetration/Aspiration during swallow;Pharyngeal residue - pyriform  Pharyngeal Material enters airway, CONTACTS cords and not ejected out  Pharyngeal- Nectar Cup --  Pharyngeal --  Pharyngeal- Nectar Straw --  Pharyngeal --  Pharyngeal- Thin Teaspoon Delayed swallow initiation-vallecula;Reduced pharyngeal peristalsis;Reduced epiglottic inversion;Reduced anterior laryngeal mobility;Reduced laryngeal elevation;Reduced airway/laryngeal closure;Reduced tongue base retraction;Pharyngeal residue - pyriform;Penetration/Apiration after swallow;Inter-arytenoid space residue  Pharyngeal Material enters airway, CONTACTS cords and not ejected out  Pharyngeal- Thin Cup --  Pharyngeal --  Pharyngeal- Thin Straw --  Pharyngeal --  Pharyngeal- Puree Delayed swallow initiation-vallecula;Reduced pharyngeal peristalsis;Reduced epiglottic inversion;Reduced anterior laryngeal mobility;Reduced laryngeal elevation;Reduced airway/laryngeal closure;Reduced tongue base retraction;Pharyngeal residue - valleculae  Pharyngeal Material does not enter airway  Pharyngeal- Mechanical Soft --  Pharyngeal --  Pharyngeal- Regular --  Pharyngeal --  Pharyngeal- Multi-consistency --  Pharyngeal --  Pharyngeal- Pill --  Pharyngeal --  Pharyngeal Comment --     CHL IP CERVICAL ESOPHAGEAL PHASE 08/06/2018  Cervical Esophageal Phase WFL  Pudding Teaspoon --  Pudding Cup --  Honey Teaspoon --  Honey Cup --  Nectar Teaspoon --  Nectar Cup --  Nectar Straw --  Thin Teaspoon --  Thin Cup --  Thin Straw --  Puree --  Mechanical Soft --  Regular --  Multi-consistency --  Pill --  Cervical Esophageal Comment --     Germain Osgood 08/06/2018, 2:16 PM   Germain Osgood, M.A.  CCC-SLP [phone]

## 2018-08-06 NOTE — Progress Notes (Addendum)
  Speech Language Pathology Treatment: Dysphagia  Patient Details Name: [name] MRN: [id number] DOB: 21-Jun-1971 Today's Date: 08/06/2018 Time: 5498-2641 SLP Time Calculation (min) (ACUTE ONLY): 12 min  Assessment / Plan / Recommendation Clinical Impression  Pt has no coughing today with trials of ice chips, thin liquids, purees. Her voice sounded wet x1 but appeared to clear spontaneously. Her voice remains dysphonic. Given clinical signs of improvement she may be ready for a PO diet; however, given only intermittent sensation of aspiration on FEES earlier this week, recommend proceeding with repeat swallow study to better assess. Would stay NPO except for ice chips after oral care pending completion.   HPI HPI: Pt is a 47 y.o. female with PMH of HTN, lupus, goiter, paranoid schizophrenia, graves disease, and DM, admitted to [facility] ED with AMS. Found to have hyperglycemia with blood sugars of 1556, anion gap acidosis, elevated lactic acid. ETT 7/27-8/3.        SLP Plan  Other (Comment)(repeat swallow study)       Recommendations  Diet recommendations: NPO;Other(comment)(ice chips okay after oral care) Medication Administration: Via alternative means                Oral Care Recommendations: Oral care QID Follow up Recommendations: Inpatient Rehab SLP Visit Diagnosis: Dysphagia, oropharyngeal phase (R13.12) Plan: Other (Comment)(repeat swallow study)       GO                Germain Osgood 08/06/2018, 11:19 AM  Germain Osgood, M.A. CCC-SLP [phone]

## 2018-08-06 NOTE — Clinical Social Work Note (Signed)
Clinical Social Work Assessment  Patient Details  Name: [name] MRN: [id number] Date of Birth: 04-03-1971  Date of referral:  08/06/18               Reason for consult:  Facility Placement                Permission sought to share information with:  Facility Sport and exercise psychologist, Family Supports Permission granted to share information::  Yes, Verbal Permission Granted  Name::     Social worker::  SNFs  Relationship::  Spouse  Contact Information:  [phone]  Housing/Transportation Living arrangements for the past 2 months:  [place] of Information:  Patient Patient Interpreter Needed:  None Criminal Activity/Legal Involvement Pertinent to Current Situation/Hospitalization:  No - Comment as needed Significant Relationships:  Spouse, Parents Lives with:  Spouse Do you feel safe going back to the place where you live?  No Need for family participation in patient care:  No (Coment)  Care giving concerns:  CSW received consult for possible SNF placement at time of discharge. CSW spoke with patient regarding PT recommendation of SNF placement at time of discharge. Patient had a very flat affect and was very focused on requesting water and lunch. CSW notified patient's nurse. Patient reported that patient's spouse is currently unable to care for patient at their home given patient's current physical needs and fall risk. Patient expressed understanding of PT recommendation and is agreeable to SNF placement at time of discharge since [facility] is unable to accept patient. CSW to continue to follow and assist with discharge planning needs.   Social Worker assessment / plan:  CSW spoke with patient concerning possibility of rehab at [facility] before returning home.  Employment status:  Retired Advertising copywriter PT Recommendations:  Inpatient [place] / Referral to community resources:  [place]  Patient/Family's Response  to care:  Patient recognizes need for rehab before returning home and is agreeable to a SNF in [place]. Patient gave CSW permission to call her husband. CSW left him a HIPPA compliant voicemail.   Patient/Family's Understanding of and Emotional Response to Diagnosis, Current Treatment, and Prognosis:  Patient/family is realistic regarding therapy needs and expressed being hopeful for water. Patient expressed understanding of CSW role and discharge process as well as medical condition. No questions/concerns about plan or treatment.    Emotional Assessment Appearance:  Appears stated age Attitude/Demeanor/Rapport:  Complaining Affect (typically observed):  Flat, Irritable Orientation:  Oriented to Self, Oriented to Place, Oriented to  Time, Oriented to Situation Alcohol / Substance use:  Not Applicable Psych involvement (Current and /or in the community):  Yes (Comment)(Med evaluation)  Discharge Needs  Concerns to be addressed:  Care Coordination Readmission within the last 30 days:  No Current discharge risk:  Physical Impairment, Psychiatric Illness Barriers to Discharge:  Continued Medical Work up   [employer], [employer] 08/06/2018, 3:51 PM

## 2018-08-06 NOTE — NC FL2 (Addendum)
[place] MEDICAID FL2 LEVEL OF CARE SCREENING TOOL     IDENTIFICATION  Patient Name: [name] Birthdate: [birth year] Sex: female Admission Date (Current Location): 07/24/2018  [facility] and [place] Number:  [employer] and Address:  [facility], [place] [address], [place], [place] [address]      Provider Number: [id number]  Attending Physician Name and Address:  Hosie Poisson, MD  Relative Name and Phone Number:  Thayer Jew, spouse, [phone]    Current Level of Care: Hospital Recommended Level of Care: [place] Prior Approval Number:    Date Approved/Denied:   PASRR Number:   [id number]  Discharge Plan: SNF    Current Diagnoses: Patient Active Problem List   Diagnosis Date Noted  . Schizoaffective disorder, bipolar type ([place])   . Hypokalemia   . Essential hypertension   . Lupus ([place])   . Poorly controlled type 2 diabetes mellitus ([place])   . Leukocytosis   . Acute respiratory failure ([place])   . AKI (acute kidney injury) ([place])   . Altered mental status   . DKA (diabetic ketoacidosis) ([place]) 07/24/2018  . DKA (diabetic ketoacidoses) ([place]) 07/24/2018  . Paranoid schizophrenia ([place]) 07/20/2014  . 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 Contact dermatitis 01/18/2013  . Vaginitis 01/18/2013  . Essential hypertension, benign 12/24/2012  . Heartburn 12/24/2012  . Hypothyroidism 12/24/2012  . Lupus anticoagulant positive 10/02/2012  . Acute DVT (deep venous thrombosis) ([place]) 09/27/2012  . Chronic kidney disease 08/28/2012  . Fibroid uterus 06/12/2012    Orientation RESPIRATION BLADDER Height & Weight     Self, Time, Situation, Place  O2(Nasal cannula 2L) Incontinent, External catheter Weight: 130.4 kg Height:  5\' 9"  (175.3 cm)  BEHAVIORAL SYMPTOMS/MOOD NEUROLOGICAL BOWEL NUTRITION STATUS      Incontinent Diet(Please see DC Summary)  AMBULATORY STATUS  COMMUNICATION OF NEEDS Skin   Limited Assist Verbally Other (Comment)(Wound on abdomen)                       Personal Care Assistance Level of Assistance  Bathing, Feeding, Dressing Bathing Assistance: Limited assistance Feeding assistance: Independent Dressing Assistance: Limited assistance     Functional Limitations Info  Sight, Hearing, Speech Sight Info: Adequate Hearing Info: Adequate Speech Info: Adequate    SPECIAL CARE FACTORS FREQUENCY  PT (By licensed PT), OT (By licensed OT)     PT Frequency: 5x/week OT Frequency: 3x/week            Contractures      Additional Factors Info  Code Status, Allergies, Psychotropic, Insulin Sliding Scale Code Status Info: Full Allergies Info: NKA Psychotropic Info: Depakote; Risperdal Insulin Sliding Scale Info: Every 4 hours       Current Medications (08/06/2018):  This is the current hospital active medication list Current Facility-Administered Medications  Medication Dose Route Frequency Provider Last Rate Last Dose  . 0.9 %  sodium chloride infusion  250 mL Intravenous PRN Parrett, Fonnie Mu, NP   Stopped at 07/31/18 1531  . 0.9 %  sodium chloride infusion   Intra-arterial PRN Parrett, Fonnie Mu, NP   Stopped at 08/04/18 2253  . 0.9 %  sodium chloride infusion   Intravenous Continuous Rexene Agent, MD 50 mL/hr at 08/05/18 2235    . acetaminophen (TYLENOL) solution 650 mg  650 mg Per Tube Q6H PRN Parrett, Tammy S, NP   650 mg at 07/28/18 0358  . chlorhexidine (  PERIDEX) 0.12 % solution 15 mL  15 mL Mouth Rinse BID Parrett, Tammy S, NP   15 mL at 08/06/18 0900  . clonazePAM (KLONOPIN) tablet 0.5 mg  0.5 mg Oral BID PRN Parrett, Tammy S, NP   0.5 mg at 08/05/18 1012  . divalproex (DEPAKOTE SPRINKLE) capsule 500 mg  500 mg Oral QHS Regalado, Belkys A, MD   500 mg at 08/05/18 2121  . docusate (COLACE) 50 MG/5ML liquid 100 mg  100 mg Oral Daily Parrett, Tammy S, NP   100 mg at 08/06/18 0900  . feeding supplement (PRO-STAT  SUGAR FREE 64) liquid 30 mL  30 mL Per Tube BID Rush Farmer, MD   30 mL at 08/06/18 0900  . feeding supplement (VITAL 1.5 CAL) liquid 1,000 mL  1,000 mL Per Tube Continuous Parrett, Tammy S, NP 55 mL/hr at 08/05/18 1159 1,000 mL at 08/05/18 1159  . free water 200 mL  200 mL Per Tube Q6H Hosie Poisson, MD   200 mL at 08/06/18 1245  . Gerhardt's butt cream   Topical BID Parrett, Tammy S, NP      . insulin aspart (novoLOG) injection 0-20 Units  0-20 Units Subcutaneous Q4H Parrett, Tammy S, NP   4 Units at 08/06/18 1243  . insulin aspart (novoLOG) injection 7 Units  7 Units Subcutaneous Q4H Parrett, Tammy S, NP   7 Units at 08/06/18 1244  . insulin detemir (LEVEMIR) injection 24 Units  24 Units Subcutaneous Q12H Hosie Poisson, MD   24 Units at 08/06/18 0900  . ipratropium-albuterol (DUONEB) 0.5-2.5 (3) MG/3ML nebulizer solution 3 mL  3 mL Nebulization Q4H PRN Parrett, Tammy S, NP      . levothyroxine (SYNTHROID, LEVOTHROID) tablet 125 mcg  125 mcg Per Tube QAC breakfast Parrett, Tammy S, NP   125 mcg at 08/06/18 0900  . MEDLINE mouth rinse  15 mL Mouth Rinse BID Regalado, Belkys A, MD   15 mL at 08/06/18 1000  . ondansetron (ZOFRAN) injection 4 mg  4 mg Intravenous Q6H PRN Parrett, Tammy S, NP   4 mg at 08/01/18 0955  . pantoprazole sodium (PROTONIX) 40 mg/20 mL oral suspension 40 mg  40 mg Per Tube Daily Parrett, Tammy S, NP   40 mg at 08/06/18 0900  . potassium chloride 20 MEQ/15ML (10%) solution 40 mEq  40 mEq Oral Daily Hosie Poisson, MD   40 mEq at 08/06/18 0959  . [place]   Oral PRN Hosie Poisson, MD      . risperiDONE (RISPERDAL) 1 MG/ML oral solution 3 mg  3 mg Per Tube QHS Rush Farmer, MD   3 mg at 08/05/18 2122  . traZODone (DESYREL) tablet 50 mg  50 mg Oral QHS PRN Hosie Poisson, MD         Discharge Medications: Please see discharge summary for a list of discharge medications.  Relevant Imaging Results:  Relevant Lab Results:   Additional Information SSN:  [place], [place]

## 2018-08-06 NOTE — Progress Notes (Signed)
Physical Therapy Treatment Patient Details Name: [name] MRN: [id number] DOB: 1971-02-26 Today's Date: 08/06/2018    History of Present Illness Pt is a 47 y.o. female with PMH of HTN, lupus, goiter, paranoid schizophrenia, and DM, admitted to [place] ED with AMS. Found to have hyperglycemia with blood sugars of 1556, anion gap acidosis, elevated lactic acid. ETT 7/27-8/3.     PT Comments    Patient is making gradual progress toward PT goals. Pt requires +2 HHA for gait and is fixated on wanting something to eat and drink. Continue to progress as tolerated.    Follow Up Recommendations  CIR;Supervision for mobility/OOB     Equipment Recommendations  (TBD)    Recommendations for Other Services Rehab consult     Precautions / Restrictions Precautions Precautions: Fall Precaution Comments: safety monitor Restrictions Weight Bearing Restrictions: No    Mobility  Bed Mobility Overal bed mobility: Needs Assistance Bed Mobility: Supine to Sit     Supine to sit: Supervision     General bed mobility comments: supervision for safety  Transfers Overall transfer level: Needs assistance Equipment used: 2 person hand held assist Transfers: Sit to/from Stand Sit to Stand: Mod assist         General transfer comment: +2 HHA to stand from EOB for balance and assistance to power up from [facility] with RW use upon standing  Ambulation/Gait Ambulation/Gait assistance: Mod assist;+2 safety/equipment Gait Distance (Feet): (5ft then 17ft) Assistive device: 2 person hand held assist;Rolling walker (2 wheeled) Gait Pattern/deviations: Shuffle;Drifts right/left;Step-through pattern;Decreased step length - right;Decreased step length - left;Wide base of support(lateral sway) Gait velocity: Decreased   General Gait Details: +2 HHA for balance and trialed use of RW however pt requires as much assist to safe use AD   Stairs             Wheelchair Mobility    Modified Rankin  (Stroke Patients Only)       Balance Overall balance assessment: Needs assistance Sitting-balance support: No upper extremity supported;Feet supported Sitting balance-Leahy Scale: Good     Standing balance support: Bilateral upper extremity supported;During functional activity Standing balance-Leahy Scale: Poor Standing balance comment: Reliant on UE support and external assist to maintain static balance                            Cognition Arousal/Alertness: Awake/alert Behavior During Therapy: Flat affect Overall Cognitive Status: No family/caregiver present to determine baseline cognitive functioning                                 General Comments: Max encouragement to vocalize words versus nodding/motioning; voice is low quality. When asked pt where she was she said, "I already told you" when told her I had not heard her could she tell me again, she did not respond. Wanted to call her husband but unable to recall the full number. Greatly distracted with wanting something to drink. Decreased safety awareness      Exercises      General Comments        Pertinent Vitals/Pain Pain Assessment: Faces Faces Pain Scale: No hurt    Home Living                      Prior Function            PT Goals (current goals can now  be found in the care plan section) Acute Rehab PT Goals PT Goal Formulation: With patient Time For Goal Achievement: 08/17/18 Potential to Achieve Goals: Good Progress towards PT goals: Progressing toward goals    Frequency    Min 3X/week      PT Plan Current plan remains appropriate    Co-evaluation PT/OT/SLP Co-Evaluation/Treatment: Yes Reason for Co-Treatment: Complexity of the patient's impairments (multi-system involvement);For patient/therapist safety;To address functional/ADL transfers   OT goals addressed during session: ADL's and self-care;Strengthening/ROM      AM-PAC PT "6 Clicks" Daily  Activity  Outcome Measure  Difficulty turning over in bed (including adjusting bedclothes, sheets and blankets)?: A Little Difficulty moving from lying on back to sitting on the side of the bed? : A Little Difficulty sitting down on and standing up from a chair with arms (e.g., wheelchair, bedside commode, etc,.)?: A Lot Help needed moving to and from a bed to chair (including a wheelchair)?: A Little Help needed walking in hospital room?: A Lot Help needed climbing 3-5 steps with a railing? : A Lot 6 Click Score: 15    End of Session Equipment Utilized During Treatment: Gait belt Activity Tolerance: Patient tolerated treatment well Patient left: in chair;with call bell/phone within reach;with chair alarm set;with nursing/sitter in room Nurse Communication: Mobility status;Other (comment)(Does not need purewick/can stand pivot to [facility]) PT Visit Diagnosis: Other abnormalities of gait and mobility (R26.89);Muscle weakness (generalized) (M62.81)     Time: 3295-1884 PT Time Calculation (min) (ACUTE ONLY): 27 min  Charges:  $Gait Training: 8-22 mins                     Earney Navy, PTA Pager: [phone]     Darliss Cheney 08/06/2018, 12:10 PM

## 2018-08-06 NOTE — Progress Notes (Signed)
Dr. Karleen Hampshire gave verbal order to remove cortrak tube as patient is tolerating orals well.

## 2018-08-06 NOTE — Progress Notes (Signed)
PROGRESS NOTE    [name]  LOV:[id number] DOB: 1971-12-27 DOA: 07/24/2018 PCP: Celene Squibb, MD   Brief Narrative:  47 year old with history of hypertension, lupus, goiter, paranoid schizophrenia, diabetes went to [facility] ED with altered mental status. EMS called by family as she was sick for 3 days and lying on the couch. Patient had seizure activity with EMS and given 5 mg of Versed. Found to have hyperglycemia with blood sugars of 1556, anion gap acidosis, elevated lactic acid. Intubated for altered mental status in the ED and transferred to [facility]. There are no prior history of seizures. She had not been taking any of her other medications except psychiatric medications for the past 2 to 3 months.  Patient was admitted with severe DKA, intubated on admission in setting of acute metabolic encephalopathy. Patient develops fever the morning of July 30 th. She presented with acute on CKD failure with cr at 7.7. Prior cr per records 3 years ago was at 1.1. On July 31, renal function continue to worse, patient [facility] catheter placement. patient was started on CRRT. Patient was extubated 8-03. She develops mild post extubation stridor which resolve with racemic epi and decadron.   Today she appears comfortable, not in any kind of distress. SLP evaluation recommended dysphagia 1 diet with honey thick liquids.  Assessment & Plan:   Principal Problem:   Schizoaffective disorder, bipolar type ([place]) Active Problems:   DKA (diabetic ketoacidosis) ([place])   DKA (diabetic ketoacidoses) ([place])   Acute respiratory failure ([facility])   AKI (acute kidney injury) ([place])   Altered mental status   Hypokalemia   Essential hypertension   Lupus ([place])   Poorly controlled type 2 diabetes mellitus (H[facility])   Leukocytosis    Acute respiratory failure with hypoxia in the setting of acute metabolic encephalopathy, probably secondary to left basilar infiltrate with MSSA IN SPUTUM.  She was intubated  on admission and extubated on 8/3. CXR repeated and shows improvement.  Completed 10 day sof IV ancef.  [place] oxygen to keep sats greater than 90%.    Hypotension: Unclear etiology, was in DKA. Required pressors briefly and has been off pressors since 8/3.    DKA: Resolved.  A1c is around 16, and her DM is not well controlled.  CBG (last 3)  Recent Labs    08/06/18 0449 08/06/18 0801 08/06/18 1212  GLUCAP 177* 195* 197*   Increased the dose of levemir and SSI.     AKI with rising creatinine on admission with oliguria. She was started on CRRT.  Now HD on hold as her creatinine is improving.  Nephrology on board and appreciate their recommendations. Urine output at 350 yesterday, not sure if measured correctly.    Hypokalemia: replaced.    Nutrition;  SLP evaluation today recommending DYSPHAGIA 1 puree diet.     Hypernatremia:  Possibly free water deficit, encourage free water via the tube.      Acute metabolic encephalopathy probably from AKI, DKA . Slightly improved but it appears she is not back to baseline.    H/o schizophrenia:  Unclear how compliant she was to her meds.  Psychiatry consulted for medications and she was started on depakote in addition to risperidone, klonopin and trazodone.  Cogentin,  on hold for now.     Hypothyroidism:  Resume home synthroid.    DVT prophylaxis: scd's Code Status:full code.  Family Communication: none at bedside.  Disposition Plan: pending clinical improvement.    Consultants:  Nephrology   Psychiatry.      Procedures: None.    Antimicrobials: ancef 10 days.    Subjective: Alert and comfortable.  Objective: Vitals:   08/05/18 2000 08/05/18 2214 08/06/18 0445 08/06/18 1325  BP: 137/77 (!) 147/97 138/86 (!) 145/94  Pulse: (!) 105 95 (!) 103 84  Resp: (!) 25 18 18 20   Temp:  98.3 F (36.8 C) 98.4 F (36.9 C) 98.6 F (37 C)  TempSrc:  Oral Oral Oral  SpO2: 95% 100% 99% 94%  Weight:    130.4 kg   Height:        Intake/Output Summary (Last 24 hours) at 08/06/2018 1541 Last data filed at 08/05/2018 2100 Gross per 24 hour  Intake 585.16 ml  Output 750 ml  Net -164.84 ml   Filed Weights   08/04/18 0500 08/05/18 0423 08/06/18 0445  Weight: 126 kg 130.5 kg 130.4 kg    Examination:  General exam: Appears calm and comfortable  Respiratory system: Clear to auscultation. Respiratory effort normal. No wheezing or rhonchi Cardiovascular system: S1 & S2 heard, RRR. No JVD,No pedal edema. Gastrointestinal system: Abdomen is nondistended, soft and nontender. Central nervous system: Alert only oriented to person. Able to move all extremities.  Extremities: no cyanosis or clubbing. Skin: No rashes, lesions or ulcers Psychiatry: calm and comfortable.    Data Reviewed: I have personally reviewed following labs and imaging studies  CBC: Recent Labs  Lab 08/02/18 0435 08/02/18 0437 08/03/18 0415 08/04/18 1022 08/05/18 0939 08/06/18 0546  WBC 9.2  --  10.7* 9.9 10.6* 7.9  HGB 8.7* 9.2* 8.4* 8.1* 8.4* 8.2*  HCT 27.7* 27.0* 27.0* 26.6* 27.8* 27.8*  MCV 83.2  --  85.7 86.9 88.5 88.3  PLT 117*  --  144* 170 184 833   Basic Metabolic Panel: Recent Labs  Lab 07/31/18 0413  08/02/18 0435  08/03/18 0415 08/04/18 1022 08/05/18 1100 08/05/18 2151 08/06/18 0546  NA 139   < > 142   < > 143 143 146* 146* 148*  K 3.5   < > 3.1*   < > 2.8* 3.0* 3.0* 3.3* 3.2*  CL 105   < > 87*   < > 88* 89* 93* 97* 97*  CO2 25   < > 42*  --  45* 43* 42* 40* 39*  GLUCOSE 165*   < > 292*   < > 218* 263* 211* 224* 202*  BUN 23*   < > 7   < > 21* 43* 45* 43* 41*  CREATININE 2.74*   < > 1.97*   < > 3.33* 3.07* 2.43* 2.18* 1.97*  CALCIUM 7.9*   < > 8.9  --  8.5* 8.3* 8.7* 8.8* 8.8*  MG 2.4  --  1.7  --  2.2  --   --   --   --   PHOS 3.2   < > 2.7  --  4.0 4.3 3.9  --  4.0   < > = values in this interval not displayed.   GFR: Estimated Creatinine Clearance: 50.1 mL/min (A) (by C-G formula  based on SCr of 1.97 mg/dL (H)). Liver Function Tests: Recent Labs  Lab 08/01/18 1620 08/02/18 0435 08/04/18 1022 08/05/18 1100 08/06/18 0546  ALBUMIN 1.7* 1.7* 2.0* 2.2* 2.2*   No results for input(s): LIPASE, AMYLASE in the last 168 hours. No results for input(s): AMMONIA in the last 168 hours. Coagulation Profile: Recent Labs  Lab 07/31/18 0806 08/02/18 0435  INR 1.20 1.14   Cardiac  Enzymes: No results for input(s): CKTOTAL, CKMB, CKMBINDEX, TROPONINI in the last 168 hours. BNP (last 3 results) No results for input(s): PROBNP in the last 8760 hours. HbA1C: No results for input(s): HGBA1C in the last 72 hours. CBG: Recent Labs  Lab 08/05/18 2113 08/06/18 0011 08/06/18 0449 08/06/18 0801 08/06/18 1212  GLUCAP 214* 164* 177* 195* 197*   Lipid Profile: No results for input(s): CHOL, HDL, LDLCALC, TRIG, CHOLHDL, LDLDIRECT in the last 72 hours. Thyroid Function Tests: No results for input(s): TSH, T4TOTAL, FREET4, T3FREE, THYROIDAB in the last 72 hours. Anemia Panel: No results for input(s): VITAMINB12, FOLATE, FERRITIN, TIBC, IRON, RETICCTPCT in the last 72 hours. Sepsis Labs: No results for input(s): PROCALCITON, LATICACIDVEN in the last 168 hours.  No results found for this or any previous visit (from the past 240 hour(s)).       Radiology Studies: No results found.      Scheduled Meds: . chlorhexidine  15 mL Mouth Rinse BID  . divalproex  500 mg Oral QHS  . docusate  100 mg Oral Daily  . feeding supplement (PRO-STAT SUGAR FREE 64)  30 mL Per Tube BID  . free water  200 mL Per Tube Q6H  . Gerhardt's butt cream   Topical BID  . insulin aspart  0-20 Units Subcutaneous TID WC  . insulin detemir  24 Units Subcutaneous Q12H  . levothyroxine  125 mcg Per Tube QAC breakfast  . mouth rinse  15 mL Mouth Rinse BID  . pantoprazole sodium  40 mg Per Tube Daily  . potassium chloride  40 mEq Oral Daily  . risperiDONE  3 mg Per Tube QHS   Continuous  Infusions: . sodium chloride Stopped (07/31/18 1531)  . sodium chloride Stopped (08/04/18 2253)     LOS: 13 days    Time spent: 35 minutes.     Hosie Poisson, MD [facility] Pager [phone]  If 7PM-7AM, please contact night-coverage [email or web address] Password [email or web address] 08/06/2018, 3:41 PM

## 2018-08-07 ENCOUNTER — Inpatient Hospital Stay (HOSPITAL_COMMUNITY): Payer: BLUE CROSS/BLUE SHIELD

## 2018-08-07 LAB — BASIC METABOLIC PANEL
Anion gap: 11 (ref 5–15)
BUN: 30 mg/dL — AB (ref 6–20)
CALCIUM: 9 mg/dL (ref 8.9–10.3)
CHLORIDE: 102 mmol/L (ref 98–111)
CO2: 34 mmol/L — AB (ref 22–32)
CREATININE: 1.62 mg/dL — AB (ref 0.44–1.00)
GFR calc non Af Amer: 36 mL/min — ABNORMAL LOW (ref >60)
GFR, EST AFRICAN AMERICAN: 42 mL/min — AB (ref >60)
Glucose, Bld: 220 mg/dL — ABNORMAL HIGH (ref 70–99)
Potassium: 3.5 mmol/L (ref 3.5–5.1)
SODIUM: 147 mmol/L — AB (ref 135–145)

## 2018-08-07 LAB — GLUCOSE, CAPILLARY
GLUCOSE-CAPILLARY: 177 mg/dL — AB (ref 70–99)
Glucose-Capillary: 194 mg/dL — ABNORMAL HIGH (ref 70–99)
Glucose-Capillary: 266 mg/dL — ABNORMAL HIGH (ref 70–99)
Glucose-Capillary: 132 mg/dL — ABNORMAL HIGH (ref 70–99)

## 2018-08-07 LAB — RENAL FUNCTION PANEL
ALBUMIN: 2.3 g/dL — AB (ref 3.5–5.0)
ANION GAP: 9 (ref 5–15)
BUN: 34 mg/dL — ABNORMAL HIGH (ref 6–20)
CALCIUM: 8.6 mg/dL — AB (ref 8.9–10.3)
CO2: 33 mmol/L — AB (ref 22–32)
Chloride: 103 mmol/L (ref 98–111)
Creatinine, Ser: 1.66 mg/dL — ABNORMAL HIGH (ref 0.44–1.00)
GFR calc Af Amer: 41 mL/min — ABNORMAL LOW (ref >60)
GFR calc non Af Amer: 35 mL/min — ABNORMAL LOW (ref >60)
GLUCOSE: 194 mg/dL — AB (ref 70–99)
PHOSPHORUS: 3.5 mg/dL (ref 2.5–4.6)
Potassium: 4.6 mmol/L (ref 3.5–5.1)
SODIUM: 145 mmol/L (ref 135–145)

## 2018-08-07 LAB — CBC
HCT: 28.4 % — ABNORMAL LOW (ref 36.0–46.0)
HEMOGLOBIN: 8.7 g/dL — AB (ref 12.0–15.0)
MCH: 26.9 pg (ref 26.0–34.0)
MCHC: 30.6 g/dL (ref 30.0–36.0)
MCV: 87.9 fL (ref 78.0–100.0)
Platelets: 161 10*3/uL (ref 150–400)
RBC: 3.23 MIL/uL — ABNORMAL LOW (ref 3.87–5.11)
RDW: 15.8 % — AB (ref 11.5–15.5)
WBC: 7.2 10*3/uL (ref 4.0–10.5)

## 2018-08-07 MED ORDER — DM-GUAIFENESIN ER 30-600 MG PO TB12
1.0000 | ORAL_TABLET | Freq: Two times a day (BID) | ORAL | Status: DC
  Administered 2018-08-07 – 2018-08-11 (×9): 1 via ORAL
  Filled 2018-08-07 (×9): qty 1

## 2018-08-07 MED ORDER — ACETAMINOPHEN 160 MG/5ML PO SOLN: 650.0000 mg | Freq: Four times a day (QID) | ORAL | Status: DC | PRN

## 2018-08-07 MED ORDER — RISPERIDONE 0.5 MG PO TABS: 3.0000 mg | ORAL_TABLET | Freq: Every day | ORAL | Status: DC

## 2018-08-07 MED ORDER — PANTOPRAZOLE SODIUM 40 MG PO TBEC
40.0000 mg | DELAYED_RELEASE_TABLET | Freq: Every day | ORAL | Status: DC
  Administered 2018-08-07 – 2018-08-11 (×5): 40 mg via ORAL
  Filled 2018-08-07 (×5): qty 1

## 2018-08-07 MED ORDER — ACETAMINOPHEN 325 MG PO TABS: 650.0000 mg | ORAL_TABLET | Freq: Four times a day (QID) | ORAL | Status: DC | PRN

## 2018-08-07 MED ORDER — LEVOTHYROXINE SODIUM 25 MCG PO TABS: 125.0000 ug | ORAL_TABLET | Freq: Every day | ORAL | Status: DC

## 2018-08-07 MED ORDER — POTASSIUM CHLORIDE CRYS ER 20 MEQ PO TBCR
40.0000 meq | EXTENDED_RELEASE_TABLET | Freq: Every day | ORAL | Status: DC
  Administered 2018-08-07 – 2018-08-08 (×2): 40 meq via ORAL
  Filled 2018-08-07 (×2): qty 2

## 2018-08-07 MED ORDER — PRO-STAT SUGAR FREE PO LIQD
30.0000 mL | Freq: Two times a day (BID) | ORAL | Status: DC
  Administered 2018-08-07 – 2018-08-08 (×3): 30 mL via ORAL
  Filled 2018-08-07 (×4): qty 30

## 2018-08-07 MED ORDER — LEVOTHYROXINE SODIUM 25 MCG PO TABS
125.0000 ug | ORAL_TABLET | Freq: Every day | ORAL | Status: DC
  Administered 2018-08-07 – 2018-08-11 (×5): 125 ug via ORAL
  Filled 2018-08-07 (×5): qty 1

## 2018-08-07 MED ORDER — DOCUSATE SODIUM 100 MG PO CAPS
100.0000 mg | ORAL_CAPSULE | Freq: Every day | ORAL | Status: DC
  Administered 2018-08-07 – 2018-08-11 (×5): 100 mg via ORAL
  Filled 2018-08-07 (×5): qty 1

## 2018-08-07 NOTE — Progress Notes (Signed)
S:Feels better and able to take po O:BP 122/74 (BP Location: Left Arm)   Pulse (!) 111   Temp 98.4 F (36.9 C) (Oral)   Resp 18   Ht 5\' 9"  (1.753 m)   Wt 133.2 kg   LMP 09/18/2012   SpO2 96%   BMI 43.36 kg/m   Intake/Output Summary (Last 24 hours) at 08/07/2018 1208 Last data filed at 08/07/2018 0900 Gross per 24 hour  Intake 1097.98 ml  Output -  Net 1097.98 ml   Intake/Output: I/O last 3 completed shifts: In: 1207.7 [P.O.:240; I.V.:967.7] Out: 400 [Urine:400]  Intake/Output this shift:  Total I/O In: 380 [P.O.:380] Out: -  Weight change:  BMW:UXLKG, nad MWN:UUVOZ Resp:cta DGU:YQIHK, +BS, soft, NT Ext: no edema  Recent Labs  Lab 08/01/18 0446  08/01/18 1620  08/02/18 0435  08/03/18 0415 08/04/18 1022 08/05/18 1100 08/05/18 2151 08/06/18 0546 08/06/18 2151 08/07/18 0352 08/07/18 0903  NA 138   < > 140   < > 142   < > 143 143 146* 146* 148* 146* 145 147*  K 2.9*   < > 3.2*   < > 3.1*   < > 2.8* 3.0* 3.0* 3.3* 3.2* 3.4* 4.6 3.5  CL 93*  --  89*   < > 87*   < > 88* 89* 93* 97* 97* 102 103 102  CO2 32  --  35*  --  42*  --  45* 43* 42* 40* 39* 35* 33* 34*  GLUCOSE 203*  --  249*   < > 292*   < > 218* 263* 211* 224* 202* 281* 194* 220*  BUN 17  --  12   < > 7   < > 21* 43* 45* 43* 41* 34* 34* 30*  CREATININE 2.35*  --  2.15*   < > 1.97*   < > 3.33* 3.07* 2.43* 2.18* 1.97* 1.79* 1.66* 1.62*  ALBUMIN 1.5*  --  1.7*  --  1.7*  --   --  2.0* 2.2*  --  2.2*  --  2.3*  --   CALCIUM 8.5*  --  8.8*  --  8.9  --  8.5* 8.3* 8.7* 8.8* 8.8* 9.0 8.6* 9.0  PHOS 2.9  --  3.0  --  2.7  --  4.0 4.3 3.9  --  4.0  --  3.5  --    < > = values in this interval not displayed.   Liver Function Tests: Recent Labs  Lab 08/05/18 1100 08/06/18 0546 08/07/18 0352  ALBUMIN 2.2* 2.2* 2.3*   No results for input(s): LIPASE, AMYLASE in the last 168 hours. No results for input(s): AMMONIA in the last 168 hours. CBC: Recent Labs  Lab 08/03/18 0415 08/04/18 1022 08/05/18 0939  08/06/18 0546 08/07/18 0352  WBC 10.7* 9.9 10.6* 7.9 7.2  HGB 8.4* 8.1* 8.4* 8.2* 8.7*  HCT 27.0* 26.6* 27.8* 27.8* 28.4*  MCV 85.7 86.9 88.5 88.3 87.9  PLT 144* 170 184 194 161   Cardiac Enzymes: No results for input(s): CKTOTAL, CKMB, CKMBINDEX, TROPONINI in the last 168 hours. CBG: Recent Labs  Lab 08/06/18 1212 08/06/18 1624 08/06/18 2137 08/07/18 0857 08/07/18 1145  GLUCAP 197* 90 278* 194* 266*    Iron Studies: No results for input(s): IRON, TIBC, TRANSFERRIN, FERRITIN in the last 72 hours. Studies/Results: Dg Chest 1 View  Result Date: 08/07/2018 CLINICAL DATA:  Cough. EXAM: CHEST  1 VIEW COMPARISON:  08/04/2018 and older exams. FINDINGS: Cardiac silhouette is normal in size. No  mediastinal or hilar masses. No convincing adenopathy. Clear lungs.  No convincing pleural effusion.  No pneumothorax. Skeletal structures are grossly intact. IMPRESSION: No active disease. Electronically Signed   By: Lajean Manes M.D.   On: 08/07/2018 12:02   . chlorhexidine  15 mL Mouth Rinse BID  . dextromethorphan-guaiFENesin  1 tablet Oral BID  . divalproex  500 mg Oral QHS  . docusate sodium  100 mg Oral Daily  . feeding supplement (PRO-STAT SUGAR FREE 64)  30 mL Oral BID  . Gerhardt's butt cream   Topical BID  . insulin aspart  0-20 Units Subcutaneous TID WC  . insulin detemir  24 Units Subcutaneous Q12H  . levothyroxine  125 mcg Oral QAC breakfast  . pantoprazole  40 mg Oral Daily  . potassium chloride  40 mEq Oral Daily  . risperiDONE  3 mg Per Tube QHS    BMET    Component Value Date/Time   NA 147 (H) 08/07/2018 0903   K 3.5 08/07/2018 0903   CL 102 08/07/2018 0903   CO2 34 (H) 08/07/2018 0903   GLUCOSE 220 (H) 08/07/2018 0903   BUN 30 (H) 08/07/2018 0903   CREATININE 1.62 (H) 08/07/2018 0903   CREATININE 0.84 10/22/2012 1455   CALCIUM 9.0 08/07/2018 0903   GFRNONAA 36 (L) 08/07/2018 0903   GFRNONAA 85 10/22/2012 1455   GFRAA 42 (L) 08/07/2018 0903   GFRAA >89  10/22/2012 1455   CBC    Component Value Date/Time   WBC 7.2 08/07/2018 0352   RBC 3.23 (L) 08/07/2018 0352   HGB 8.7 (L) 08/07/2018 0352   HCT 28.4 (L) 08/07/2018 0352   PLT 161 08/07/2018 0352   MCV 87.9 08/07/2018 0352   MCH 26.9 08/07/2018 0352   MCHC 30.6 08/07/2018 0352   RDW 15.8 (H) 08/07/2018 0352   LYMPHSABS 1.2 07/24/2018 1704   MONOABS 0.4 07/24/2018 1704   EOSABS 0.1 07/24/2018 1704   BASOSABS 0.1 07/24/2018 1704     Assessment/Plan:  1. AKI- requiring CVVHD from 8/1-8/5 in setting of DKA and severe hypovolemia.  Thankfully she has recovered renal function and temp HD catheter has been removed.  Scr continues to improve.  Nothing further to add.  Will sign off, please call with any questions or concerns.  No need to f/u as an outpatient if her creatinine continues to return to normal. 2. Hypernatremia- push free water intake but if still increases will need hypotonic IVF's. 3. DKA- improved/resolved 4. Hypokalemia- continue to replete and follow 5. Acute metabolic encephalopathy- improved 6. H/o schizophrenia- continue with meds 7. Hypothyroidism 8. Anemia of critical illness- transfuse prn.  Donetta Potts, MD [employer] [phone]

## 2018-08-07 NOTE — Clinical Social Work Note (Signed)
CSW received [facility] number which is [id number] E expiration 09/05/2018.  Jones Broom. [place], MSW, [place]  08/07/2018 11:41 AM

## 2018-08-07 NOTE — Clinical Social Work Note (Signed)
CSW attempted to contact patient's husband Thayer Jew [phone], to present bed offers, left a message on voice mail awaiting for call back.  Patient to continued to be followed by social work.  Jones Broom. [place], MSW, [place]  08/07/2018 11:47 AM

## 2018-08-07 NOTE — Progress Notes (Signed)
PROGRESS NOTE    [name]  SWH:[id number] DOB: Jun 08, 1971 DOA: 07/24/2018 PCP: Celene Squibb, MD   Brief Narrative:47 year old with history of hypertension, lupus, goiter, paranoid schizophrenia, diabetes went to [facility] ED with altered mental status. EMS called by family as she was sick for 3 days and lying on the couch. Patient had seizure activity with EMS and given 5 mg of Versed. Found to have hyperglycemia with blood sugars of 1556, anion gap acidosis, elevated lactic acid. Intubated for altered mental status in the ED and transferred to [facility]. There are no prior history of seizures. She had not been taking any of her other medications except psychiatric medications for the past 2 to 3 months.  Patient was admitted with severe DKA, intubated on admission in setting of acute metabolic encephalopathy. Patient develops fever the morning of July 30 th. She presented with acute on CKD failure with cr at 7.7. Prior cr per records 3 years ago was at 1.1. On July 31, renal function continue to worse, patient [facility] catheter placement. patient was started on CRRT. Patient was extubated 8-03. She develops mild post extubation stridor which resolve with racemic epi and decadron.   Today she appears comfortable, not in any kind of distress. SLP evaluation recommended dysphagia 1 diet with honey thick liquids.  Assessment & Plan:   Principal Problem:   Schizoaffective disorder, bipolar type ([place]) Active Problems:   DKA (diabetic ketoacidosis) ([place])   DKA (diabetic ketoacidoses) (HCC)   Acute respiratory failure (HCC)   AKI (acute kidney injury) ([place])   Altered mental status   Hypokalemia   Essential hypertension   Lupus ([place])   Poorly controlled type 2 diabetes mellitus ([place])   Leukocytosis  1] status post hypoxic acute respiratory failure secondary to left basilar pneumonia with MSSA.  She received Ancef for a total of 10 days.  Chest x-ray/8 7/19 mild pulmonary  hypoinflation results in some crowding of the lung markings. I cannot exclude low-grade CHF in the appropriate clinical setting. No alveolar pneumonia Current saturation is above 92% on RA.  2] status post DKA resolved hemoglobin A1c around 16.  Levemir dose was increased yesterday follow and adjust the dose as needed.  3] status post hypotension  resolved she was briefly on pressors which has been stopped since 07/31/2018.  4] AKI she was on CRRT.  Hemodialysis on hold creatinine improving nephrology following.  Creatinine down to 1.66 potassium 4.6.  5] hypernatremia-improved with free water sodium 145 today.  6] status post acute metabolic encephalopathy secondary to DKA and AKI which is improved.  Creatinine 1.66 today which is down from 2.43.  7] patient with history of schizophrenia noncompliant medications.  Psychiatry consulted recommended to start Resporal, Klonopin, trazodone in addition to Depakote.  Patient was on Cogentin at home which is on hold at this time.  8] hypothyroidism continue Synthroid.  9] anemia of chronic disease hemoglobin has remained stable.     DVT prophylaxis: SCD Code Status: Full code Family Communication: None Disposition Plan: PT recommends CIR will consult CIR. Consultants:  Psychiatry and nephrology.  Procedures: None Antimicrobials: None at this time she received Ancef for 10 days.  Subjective: Resting in bed was able to wake her up answer some questions appropriately.   Objective: Vitals:   08/06/18 0445 08/06/18 1325 08/06/18 2110 08/07/18 0423  BP: 138/86 (!) 145/94 (!) 147/96 122/74  Pulse: (!) 103 84 (!) 112 (!) 111  Resp: 18 20 16 18   Temp:  98.4 F (36.9 C) 98.6 F (37 C) 99.1 F (37.3 C) 98.4 F (36.9 C)  TempSrc: Oral Oral Oral Oral  SpO2: 99% 94% 100% 96%  Weight: 130.4 kg     Height:        Intake/Output Summary (Last 24 hours) at 08/07/2018 0908 Last data filed at 08/06/2018 1717 Gross per 24 hour  Intake 717.98 ml    Output -  Net 717.98 ml   Filed Weights   08/04/18 0500 08/05/18 0423 08/06/18 0445  Weight: 126 kg 130.5 kg 130.4 kg    Examination:  General exam: Appears calm and comfortable  Respiratory system: Clear to auscultation. Respiratory effort normal. Cardiovascular system: S1 & S2 heard, RRR. No JVD, murmurs, rubs, gallops or clicks. No pedal edema. Gastrointestinal system: Abdomen is nondistended, soft and nontender. No organomegaly or masses felt. Normal bowel sounds heard. Central nervous system: Awake follows commands.  Extremities: Symmetric 5 x 5 power. Skin: No rashes, lesions or ulcers    Data Reviewed: I have personally reviewed following labs and imaging studies  CBC: Recent Labs  Lab 08/03/18 0415 08/04/18 1022 08/05/18 0939 08/06/18 0546 08/07/18 0352  WBC 10.7* 9.9 10.6* 7.9 7.2  HGB 8.4* 8.1* 8.4* 8.2* 8.7*  HCT 27.0* 26.6* 27.8* 27.8* 28.4*  MCV 85.7 86.9 88.5 88.3 87.9  PLT 144* 170 184 194 409   Basic Metabolic Panel: Recent Labs  Lab 08/02/18 0435  08/03/18 0415 08/04/18 1022 08/05/18 1100 08/05/18 2151 08/06/18 0546 08/06/18 2151 08/07/18 0352  NA 142   < > 143 143 146* 146* 148* 146* 145  K 3.1*   < > 2.8* 3.0* 3.0* 3.3* 3.2* 3.4* 4.6  CL 87*   < > 88* 89* 93* 97* 97* 102 103  CO2 42*  --  45* 43* 42* 40* 39* 35* 33*  GLUCOSE 292*   < > 218* 263* 211* 224* 202* 281* 194*  BUN 7   < > 21* 43* 45* 43* 41* 34* 34*  CREATININE 1.97*   < > 3.33* 3.07* 2.43* 2.18* 1.97* 1.79* 1.66*  CALCIUM 8.9  --  8.5* 8.3* 8.7* 8.8* 8.8* 9.0 8.6*  MG 1.7  --  2.2  --   --   --   --   --   --   PHOS 2.7  --  4.0 4.3 3.9  --  4.0  --  3.5   < > = values in this interval not displayed.   GFR: Estimated Creatinine Clearance: 59.5 mL/min (A) (by C-G formula based on SCr of 1.66 mg/dL (H)). Liver Function Tests: Recent Labs  Lab 08/02/18 0435 08/04/18 1022 08/05/18 1100 08/06/18 0546 08/07/18 0352  ALBUMIN 1.7* 2.0* 2.2* 2.2* 2.3*   No results for  input(s): LIPASE, AMYLASE in the last 168 hours. No results for input(s): AMMONIA in the last 168 hours. Coagulation Profile: Recent Labs  Lab 08/02/18 0435  INR 1.14   Cardiac Enzymes: No results for input(s): CKTOTAL, CKMB, CKMBINDEX, TROPONINI in the last 168 hours. BNP (last 3 results) No results for input(s): PROBNP in the last 8760 hours. HbA1C: No results for input(s): HGBA1C in the last 72 hours. CBG: Recent Labs  Lab 08/06/18 0449 08/06/18 0801 08/06/18 1212 08/06/18 1624 08/06/18 2137  GLUCAP 177* 195* 197* 90 278*   Lipid Profile: No results for input(s): CHOL, HDL, LDLCALC, TRIG, CHOLHDL, LDLDIRECT in the last 72 hours. Thyroid Function Tests: No results for input(s): TSH, T4TOTAL, FREET4, T3FREE, THYROIDAB in the last 72 hours.  Anemia Panel: No results for input(s): VITAMINB12, FOLATE, FERRITIN, TIBC, IRON, RETICCTPCT in the last 72 hours. Sepsis Labs: No results for input(s): PROCALCITON, LATICACIDVEN in the last 168 hours.  No results found for this or any previous visit (from the past 240 hour(s)).       Radiology Studies: No results found.      Scheduled Meds: . chlorhexidine  15 mL Mouth Rinse BID  . divalproex  500 mg Oral QHS  . docusate  100 mg Oral Daily  . feeding supplement (PRO-STAT SUGAR FREE 64)  30 mL Oral BID  . Gerhardt's butt cream   Topical BID  . insulin aspart  0-20 Units Subcutaneous TID WC  . insulin detemir  24 Units Subcutaneous Q12H  . levothyroxine  125 mcg Oral QAC breakfast  . pantoprazole  40 mg Oral Daily  . potassium chloride  40 mEq Oral Daily  . risperiDONE  3 mg Per Tube QHS   Continuous Infusions: . sodium chloride Stopped (07/31/18 1531)     LOS: 14 days     Georgette Shell, MD [facility]  If 7PM-7AM, please contact night-coverage [email or web address] Password [email or web address] 08/07/2018, 9:08 AM

## 2018-08-08 LAB — RENAL FUNCTION PANEL
ALBUMIN: 2.3 g/dL — AB (ref 3.5–5.0)
ANION GAP: 9 (ref 5–15)
BUN: 26 mg/dL — ABNORMAL HIGH (ref 6–20)
CALCIUM: 8.7 mg/dL — AB (ref 8.9–10.3)
CO2: 33 mmol/L — ABNORMAL HIGH (ref 22–32)
CREATININE: 1.48 mg/dL — AB (ref 0.44–1.00)
Chloride: 105 mmol/L (ref 98–111)
GFR calc Af Amer: 47 mL/min — ABNORMAL LOW (ref >60)
GFR calc non Af Amer: 40 mL/min — ABNORMAL LOW (ref >60)
GLUCOSE: 143 mg/dL — AB (ref 70–99)
PHOSPHORUS: 3.6 mg/dL (ref 2.5–4.6)
Potassium: 3.4 mmol/L — ABNORMAL LOW (ref 3.5–5.1)
SODIUM: 147 mmol/L — AB (ref 135–145)

## 2018-08-08 LAB — CBC WITH DIFFERENTIAL/PLATELET
Abs Immature Granulocytes: 0.1 10*3/uL (ref 0.0–0.1)
BASOS ABS: 0 10*3/uL (ref 0.0–0.1)
BASOS PCT: 0 %
EOS ABS: 0.1 10*3/uL (ref 0.0–0.7)
Eosinophils Relative: 1 %
HCT: 27.7 % — ABNORMAL LOW (ref 36.0–46.0)
Hemoglobin: 8.4 g/dL — ABNORMAL LOW (ref 12.0–15.0)
Immature Granulocytes: 1 %
Lymphocytes Relative: 24 %
Lymphs Abs: 1.5 10*3/uL (ref 0.7–4.0)
MCH: 26.7 pg (ref 26.0–34.0)
MCHC: 30.3 g/dL (ref 30.0–36.0)
MCV: 87.9 fL (ref 78.0–100.0)
MONO ABS: 0.4 10*3/uL (ref 0.1–1.0)
MONOS PCT: 7 %
NEUTROS PCT: 67 %
Neutro Abs: 4.2 10*3/uL (ref 1.7–7.7)
PLATELETS: 185 10*3/uL (ref 150–400)
RBC: 3.15 MIL/uL — ABNORMAL LOW (ref 3.87–5.11)
RDW: 15.6 % — AB (ref 11.5–15.5)
WBC: 6.3 10*3/uL (ref 4.0–10.5)

## 2018-08-08 LAB — GLUCOSE, CAPILLARY
GLUCOSE-CAPILLARY: 147 mg/dL — AB (ref 70–99)
GLUCOSE-CAPILLARY: 182 mg/dL — AB (ref 70–99)
Glucose-Capillary: 226 mg/dL — ABNORMAL HIGH (ref 70–99)
Glucose-Capillary: 139 mg/dL — ABNORMAL HIGH (ref 70–99)

## 2018-08-08 MED ORDER — POTASSIUM CHLORIDE 20 MEQ PO PACK
40.0000 meq | PACK | Freq: Once | ORAL | Status: AC
  Administered 2018-08-08: 40 meq via ORAL
  Filled 2018-08-08: qty 2

## 2018-08-08 MED ORDER — SODIUM CHLORIDE 0.45 % IV BOLUS
250.0000 mL | INTRAVENOUS | Status: AC
  Administered 2018-08-08 – 2018-08-09 (×6): 250 mL via INTRAVENOUS

## 2018-08-08 NOTE — Progress Notes (Signed)
PROGRESS NOTE    [name]  WUJ:[id number] DOB: 01-Mar-1971 DOA: 07/24/2018 PCP: Celene Squibb, MD   Brief Narrative:47 year old with history of hypertension, lupus, goiter, paranoid schizophrenia, diabetes went to [facility] ED with altered mental status. EMS called by family as she was sick for 3 days and lying on the couch. Patient had seizure activity with EMS and given 5 mg of Versed. Found to have hyperglycemia with blood sugars of 1556, anion gap acidosis, elevated lactic acid. Intubated for altered mental status in the ED and transferred to [facility]. There are no prior history of seizures. She had not been taking any of her other medications except psychiatric medications for the past 2 to 3 months.  Patient was admitted with severe DKA, intubated on admission in setting of acute metabolic encephalopathy. Patient develops fever the morning of July 30 th. She presented with acute on CKD failure with cr at 7.7. Prior cr per records 3 years ago was at 1.1. On July 31, renal function continue to worse, patient [facility] catheter placement. patient was started on CRRT. Patient was extubated 8-03. She develops mild post extubation stridor which resolve with racemic epi and decadron.   Today she appears comfortable, not in any kind of distress. SLP evaluation recommended dysphagia 1 diet with honey thick liquids.  Assessment & Plan:   Principal Problem:   Schizoaffective disorder, bipolar type ([employer]) Active Problems:   DKA (diabetic ketoacidosis) ([place])   DKA (diabetic ketoacidoses) (HCC)   Acute respiratory failure (HCC)   AKI (acute kidney injury) ([place])   Altered mental status   Hypokalemia   Essential hypertension   Lupus ([place])   Poorly controlled type 2 diabetes mellitus ([place])   Leukocytosis  1] status post hypoxic acute respiratory failure secondary to left basilar pneumonia with MSSA.  Intubated for altered mental status.  Completed Ancef for a total of 10 days.  Chest  x-ray/8 7/19 mild pulmonary hypoinflation results in some crowding of the lung markings. I cannot exclude low-grade CHF. No alveolar pneumonia  - Current saturation is above 97% on RA. - PT recommending CIR, but rehab admission co-ordinator says husband cannot give the 24/7 support needed for consideration of CIR . talked to patient's husband and father at bedside today, he is agreeable to skilled nursing facility placement.  -Repeat swallow eval a.m.  2] status post DKA resolved hemoglobin A1c around 16.  2/2 noncompliance. -Continue Levemir  -Sliding scale  3] status post hypotension  resolved she was briefly on pressors which has been stopped since 07/31/2018.  4] AKI she was briefly on CRRT.  Hemodialysis on hold creatinine improving nephrology signed off.  creatinine 7.7 >> 1.4.  5] hypernatremia- 147.  Hypokalemia- 3.4. patient on honey thickened liquids -Half-normal saline to 250 bolus x 6. (patients nurse says continous infusion would aggravate patient. -Replete K - BMP a.m -repeat swallow eval evaluation a.m., if passes can take free water.  6] status post acute metabolic encephalopathy secondary to DKA and AKI which is improved.    7] patient with history of schizophrenia noncompliant medications.  Psychiatry consulted recommended to start Resporal, Klonopin, trazodone in addition to Depakote.  Patient was on Cogentin at home which is on hold at this time.  8] hypothyroidism continue Synthroid.  9] anemia of chronic disease hemoglobin has remained stable.   DVT prophylaxis: SCD Code Status: Full code Family Communication: None Disposition Plan: [facility]  Awaiting bed availability Consultants:  Psychiatry and nephrology.  Procedures: None Antimicrobials:  None at this time she received Ancef for 10 days.  Subjective: No complaints.  Awake and alert . Tells me she is at [facility]  Answers most questions correctly.  Not able to tell me why she was admitted to the  hospital.  Objective: Vitals:   08/07/18 2143 08/08/18 0542 08/08/18 0834 08/08/18 1328  BP: 138/85 (!) 133/93 126/76 119/86  Pulse: 88 (!) 108 (!) 109 (!) 105  Resp: 16 20  18   Temp: 98.7 F (37.1 C) 99.4 F (37.4 C) 99.1 F (37.3 C) 98.5 F (36.9 C)  TempSrc: Oral Oral    SpO2: 97% 95% 98% 99%  Weight:      Height:        Intake/Output Summary (Last 24 hours) at 08/08/2018 1852 Last data filed at 08/08/2018 1844 Gross per 24 hour  Intake 700 ml  Output -  Net 700 ml   Filed Weights   08/05/18 0423 08/06/18 0445 08/07/18 0943  Weight: 130.5 kg 130.4 kg 133.2 kg    Examination:  General exam: Appears calm and comfortable  Respiratory system: Clear to auscultation. Respiratory effort normal. Cardiovascular system: S1 & S2 heard, RRR. No JVD, murmurs, rubs, gallops or clicks. No pedal edema. Gastrointestinal system: Abdomen is nondistended, soft and nontender. No organomegaly or masses felt. Normal bowel sounds heard. Central nervous system: Awake follows commands.  Extremities: Symmetric 5 x 5 power. Skin: No rashes, lesions or ulcers  Data Reviewed: I have personally reviewed following labs and imaging studies  CBC: Recent Labs  Lab 08/04/18 1022 08/05/18 0939 08/06/18 0546 08/07/18 0352 08/08/18 0352  WBC 9.9 10.6* 7.9 7.2 6.3  NEUTROABS  --   --   --   --  4.2  HGB 8.1* 8.4* 8.2* 8.7* 8.4*  HCT 26.6* 27.8* 27.8* 28.4* 27.7*  MCV 86.9 88.5 88.3 87.9 87.9  PLT 170 184 194 161 174   Basic Metabolic Panel: Recent Labs  Lab 08/02/18 0435  08/03/18 0415 08/04/18 1022 08/05/18 1100  08/06/18 0546 08/06/18 2151 08/07/18 0352 08/07/18 0903 08/08/18 0352  NA 142   < > 143 143 146*   < > 148* 146* 145 147* 147*  K 3.1*   < > 2.8* 3.0* 3.0*   < > 3.2* 3.4* 4.6 3.5 3.4*  CL 87*   < > 88* 89* 93*   < > 97* 102 103 102 105  CO2 42*  --  45* 43* 42*   < > 39* 35* 33* 34* 33*  GLUCOSE 292*   < > 218* 263* 211*   < > 202* 281* 194* 220* 143*  BUN 7   < > 21*  43* 45*   < > 41* 34* 34* 30* 26*  CREATININE 1.97*   < > 3.33* 3.07* 2.43*   < > 1.97* 1.79* 1.66* 1.62* 1.48*  CALCIUM 8.9  --  8.5* 8.3* 8.7*   < > 8.8* 9.0 8.6* 9.0 8.7*  MG 1.7  --  2.2  --   --   --   --   --   --   --   --   PHOS 2.7  --  4.0 4.3 3.9  --  4.0  --  3.5  --  3.6   < > = values in this interval not displayed.   Liver Function Tests: Recent Labs  Lab 08/04/18 1022 08/05/18 1100 08/06/18 0546 08/07/18 0352 08/08/18 0352  ALBUMIN 2.0* 2.2* 2.2* 2.3* 2.3*   Coagulation Profile: Recent  Labs  Lab 08/02/18 0435  INR 1.14   CBG: Recent Labs  Lab 08/07/18 1722 08/07/18 2112 08/08/18 0758 08/08/18 1148 08/08/18 1658  GLUCAP 132* 177* 147* 226* 139*     Radiology Studies: Dg Chest 1 View  Result Date: 08/07/2018 CLINICAL DATA:  Cough. EXAM: CHEST  1 VIEW COMPARISON:  08/04/2018 and older exams. FINDINGS: Cardiac silhouette is normal in size. No mediastinal or hilar masses. No convincing adenopathy. Clear lungs.  No convincing pleural effusion.  No pneumothorax. Skeletal structures are grossly intact. IMPRESSION: No active disease. Electronically Signed   By: Lajean Manes M.D.   On: 08/07/2018 12:02   Scheduled Meds: . chlorhexidine  15 mL Mouth Rinse BID  . dextromethorphan-guaiFENesin  1 tablet Oral BID  . divalproex  500 mg Oral QHS  . docusate sodium  100 mg Oral Daily  . feeding supplement (PRO-STAT SUGAR FREE 64)  30 mL Oral BID  . Gerhardt's butt cream   Topical BID  . insulin aspart  0-20 Units Subcutaneous TID WC  . insulin detemir  24 Units Subcutaneous Q12H  . levothyroxine  125 mcg Oral QAC breakfast  . pantoprazole  40 mg Oral Daily  . potassium chloride  40 mEq Oral Once  . risperiDONE  3 mg Per Tube QHS   Continuous Infusions: . sodium chloride Stopped (07/31/18 1531)  . sodium chloride 250 mL (08/08/18 1838)     LOS: 15 days    Latrice Storlie Arlyce Dice, MD [facility]  If 7PM-7AM, please contact  night-coverage [email or web address] Password [facility] 08/08/2018, 6:52 PM

## 2018-08-08 NOTE — Progress Notes (Signed)
Patient noncompliant with spooning honey thick fluids, no cough when she drinks from cup, re-education given, asked Tammy from speech to re-evaluate patient, however due to the silent aspiration possibilities she would like Mickel Baas to re-evaluate her tomorrow.

## 2018-08-09 DIAGNOSIS — R5381 Other malaise: Secondary | ICD-10-CM

## 2018-08-09 LAB — RENAL FUNCTION PANEL
ALBUMIN: 2.3 g/dL — AB (ref 3.5–5.0)
Anion gap: 11 (ref 5–15)
BUN: 18 mg/dL (ref 6–20)
CHLORIDE: 106 mmol/L (ref 98–111)
CO2: 29 mmol/L (ref 22–32)
Calcium: 8.8 mg/dL — ABNORMAL LOW (ref 8.9–10.3)
Creatinine, Ser: 1.29 mg/dL — ABNORMAL HIGH (ref 0.44–1.00)
GFR calc Af Amer: 55 mL/min — ABNORMAL LOW (ref >60)
GFR calc non Af Amer: 48 mL/min — ABNORMAL LOW (ref >60)
Glucose, Bld: 110 mg/dL — ABNORMAL HIGH (ref 70–99)
PHOSPHORUS: 3.5 mg/dL (ref 2.5–4.6)
POTASSIUM: 3.3 mmol/L — AB (ref 3.5–5.1)
Sodium: 146 mmol/L — ABNORMAL HIGH (ref 135–145)

## 2018-08-09 LAB — GLUCOSE, CAPILLARY
Glucose-Capillary: 117 mg/dL — ABNORMAL HIGH (ref 70–99)
Glucose-Capillary: 204 mg/dL — ABNORMAL HIGH (ref 70–99)
Glucose-Capillary: 170 mg/dL — ABNORMAL HIGH (ref 70–99)
Glucose-Capillary: 144 mg/dL — ABNORMAL HIGH (ref 70–99)

## 2018-08-09 MED ORDER — ADULT MULTIVITAMIN W/MINERALS CH
1.0000 | ORAL_TABLET | Freq: Every day | ORAL | Status: DC
  Administered 2018-08-10 – 2018-08-11 (×2): 1 via ORAL
  Filled 2018-08-09 (×3): qty 1

## 2018-08-09 MED ORDER — POTASSIUM CHLORIDE CRYS ER 20 MEQ PO TBCR
40.0000 meq | EXTENDED_RELEASE_TABLET | ORAL | Status: AC
  Administered 2018-08-09 (×2): 40 meq via ORAL
  Filled 2018-08-09 (×3): qty 2

## 2018-08-09 NOTE — Progress Notes (Signed)
Physical Therapy Treatment Patient Details Name: [name] MRN: [id number] DOB: 05-Oct-1971 Today's Date: 08/09/2018    History of Present Illness Pt is a 47 y.o. female with PMH of HTN, lupus, goiter, paranoid schizophrenia, and DM, admitted to [place] ED with AMS. Found to have hyperglycemia with blood sugars of 1556, anion gap acidosis, elevated lactic acid. ETT 7/27-8/3.     PT Comments    Patient is making progress toward PT goals. Pt requires assistance for safety with functional transfers and gait. Continue to progress as tolerated.    Follow Up Recommendations  CIR;Supervision for mobility/OOB     Equipment Recommendations  (TBD)    Recommendations for Other Services Rehab consult     Precautions / Restrictions Precautions Precautions: Fall    Mobility  Bed Mobility Overal bed mobility: Modified Independent Bed Mobility: Supine to Sit           General bed mobility comments: increased time and effort  Transfers Overall transfer level: Needs assistance Equipment used: None Transfers: Sit to/from Stand Sit to Stand: Min guard;Min assist         General transfer comment: assist to steady  Ambulation/Gait Ambulation/Gait assistance: +2 safety/equipment;Min assist Gait Distance (Feet): (5ft X3 with seated rest breaks) Assistive device: (assist at trunk with gait belt) Gait Pattern/deviations: Shuffle;Drifts right/left;Step-through pattern;Decreased step length - right;Decreased step length - left;Wide base of support(lateral sway) Gait velocity: Decreased   General Gait Details: decreased cadence and assistance needed for balance   Stairs             Wheelchair Mobility    Modified Rankin (Stroke Patients Only)       Balance Overall balance assessment: Needs assistance Sitting-balance support: No upper extremity supported;Feet supported Sitting balance-Leahy Scale: Good       Standing balance-Leahy Scale: Poor                               Cognition Arousal/Alertness: Awake/alert Behavior During Therapy: Flat affect Overall Cognitive Status: No family/caregiver present to determine baseline cognitive functioning                                        Exercises      General Comments        Pertinent Vitals/Pain Pain Assessment: No/denies pain    Home Living                      Prior Function            PT Goals (current goals can now be found in the care plan section) Acute Rehab PT Goals PT Goal Formulation: With patient Time For Goal Achievement: 08/17/18 Potential to Achieve Goals: Good Progress towards PT goals: Progressing toward goals    Frequency    Min 3X/week      PT Plan Current plan remains appropriate    Co-evaluation PT/OT/SLP Co-Evaluation/Treatment: Yes            AM-PAC PT "6 Clicks" Daily Activity  Outcome Measure  Difficulty turning over in bed (including adjusting bedclothes, sheets and blankets)?: A Little Difficulty moving from lying on back to sitting on the side of the bed? : A Little Difficulty sitting down on and standing up from a chair with arms (e.g., wheelchair, bedside commode, etc,.)?: A Little Help needed moving  to and from a bed to chair (including a wheelchair)?: A Little Help needed walking in hospital room?: A Little Help needed climbing 3-5 steps with a railing? : A Lot 6 Click Score: 17    End of Session Equipment Utilized During Treatment: Gait belt Activity Tolerance: Patient tolerated treatment well Patient left: in chair;with call bell/phone within reach;with chair alarm set Nurse Communication: Mobility status PT Visit Diagnosis: Other abnormalities of gait and mobility (R26.89);Muscle weakness (generalized) (M62.81)     Time: 3014-1597 PT Time Calculation (min) (ACUTE ONLY): 24 min  Charges:  $Gait Training: 8-22 mins $Therapeutic Activity: 8-22 mins                     Earney Navy,  PTA Pager: [phone]     Darliss Cheney 08/09/2018, 3:24 PM

## 2018-08-09 NOTE — Progress Notes (Signed)
Nutrition Follow-up  DOCUMENTATION CODES:   Obesity unspecified  INTERVENTION:   Magic cup TID with meals, each supplement provides 290 kcal and 9 grams of protein  MVI daily  NUTRITION DIAGNOSIS:   Inadequate oral intake related to inability to eat as evidenced by NPO status. -pt advanced to dysphagia 1 diet   GOAL:   Patient will meet greater than or equal to 90% of their needs -progressing   MONITOR:   PO intake, Supplement acceptance, Labs, Weight trends, Skin, I & O's  ASSESSMENT:   Patient with PMH HTN, lupus, goiter, paranoid schizophrenia, diabetes presented with AMS. Family states she was sick for 3 days and just lying on the couch. Blood sugar upon presentation was 1556, admitted for DKA, AKI.   Met with pt in room today. Pt reports good appetite but reports that she hates the pureed diet. Pt documented to be eating 100% of meals but reports poor oral intake. Pt refusing majority of the prostats. RD will add Magic Cups to help pt meet her estimated needs. As SLP upgrades pt's diet can add additional supplements. Per chart, pt with weight gain since admit; pt +4.4L on her I & 0s. Pt to discharge to SNF.   Medications reviewed and include: colace, insulin, synthroid, insulin, protonix, KCl  Labs reviewed: Na 146(H), creat 1.29(H), P 3.5 wnl Hgb 8.4(L), Hct 27.7(L) cbgs- 117, 204 x 24 hrs  Diet Order:   Diet Order            DIET - DYS 1 Room service appropriate? Yes; Fluid consistency: Honey Thick  Diet effective now             EDUCATION NEEDS:   Not appropriate for education at this time  Skin:  Skin Assessment: Reviewed RN Assessment(MSAD to L breast) Skin Integrity Issues:: Other (Comment) Other: MASD: breast, buttock;  Last BM:  8/10-TYPE 6  Height:   Ht Readings from Last 1 Encounters:  08/07/18 _0  (1.753 m)    Weight:   Wt Readings from Last 1 Encounters:  08/07/18 133.2 kg    Ideal Body Weight:  65.9 kg  BMI:  Body mass index  is 43.36 kg/m.  Estimated Nutritional Needs:   Kcal:  6191-5502 kcals   Protein:  100-132 g  Fluid:  >/= 1.7 L  Koleen Distance MS, RD, LDN Pager #- [phone] Office#- [phone] After Hours Pager: [phone]

## 2018-08-09 NOTE — Progress Notes (Signed)
PROGRESS NOTE    [name]  GDJ:[id number] DOB: 08/29/1971 DOA: 07/24/2018 PCP: Celene Squibb, MD   Brief Narrative:47 year old with history of hypertension, lupus, goiter, paranoid schizophrenia, diabetes went to [facility] ED with altered mental status. EMS called by family as she was sick for 3 days and lying on the couch. Patient had seizure activity with EMS and given 5 mg of Versed. Found to have hyperglycemia with blood sugars of 1556, anion gap acidosis, elevated lactic acid. Intubated for altered mental status in the ED and transferred to [facility]. There are no prior history of seizures. She had not been taking any of her other medications except psychiatric medications for the past 2 to 3 months.  Patient was admitted with severe DKA, intubated on admission in setting of acute metabolic encephalopathy. Patient develops fever the morning of July 30 th. She presented with acute on CKD failure with cr at 7.7. Prior cr per records 3 years ago was at 1.1. On July 31, renal function continue to worse, patient [facility] catheter placement. patient was started on CRRT. Patient was extubated 8-03. She develops mild post extubation stridor which resolve with racemic epi and decadron.   Today she appears comfortable, not in any kind of distress. SLP evaluation recommended dysphagia 1 diet with honey thick liquids.  Assessment & Plan:   Principal Problem:   Schizoaffective disorder, bipolar type ([place]) Active Problems:   DKA (diabetic ketoacidosis) ([place])   DKA (diabetic ketoacidoses) ([place])   Acute respiratory failure (HCC)   AKI (acute kidney injury) ([place])   Altered mental status   Hypokalemia   Essential hypertension   Lupus ([place])   Poorly controlled type 2 diabetes mellitus ([place])   Leukocytosis   Acute hypoxic resp failure d/t L basilar pneumonia: MSSA grew from sputum culture. BLood cx's were negative. Pt was Intubated for altered mental status.  Completed Ancef for a  total of 10 days.  Debility:  - PT repeated rehab stay.  Per SW plan is for SNF placement in [place] at [facility], family request. Awaiting approval.    Diabetic ketoacidosis: admitting diagnosis/ issue. Resolved hemoglobin A1c around 16.  2/2 noncompliance. -Continue Levemir  -Sliding scale  Status post hypotension:  resolved she was briefly on pressors which has been stopped since 07/31/2018.  AKI: she was briefly on CRRT.  Hemodialysis on hold creatinine improving nephrology signed off.  creatinine 7.7 >> 1.4.  Hypernatremia- 147.    Hypokalemia- 3.4. patient on honey thickened liquids -Half-normal saline to 250 bolus x 6. (patients nurse says continous infusion would aggravate patient. -Replete K - BMP a.m -repeat swallow eval evaluation a.m., if passes can take free water.  Status post acute metabolic encephalopathy:  secondary to DKA and AKI which is improved.    History of schizophrenia: noncompliant medications.  Psychiatry consulted recommended to start Resporal, Klonopin, trazodone in addition to Depakote.  Patient was on Cogentin at home which is on hold at this time.  8] hypothyroidism continue Synthroid.  9] anemia of chronic disease hemoglobin has remained stable.   Kelly Splinter MD [facility] pgr [phone] 05/23/2018, 9:25 AM   DVT prophylaxis: SCD Code Status: Full code Family Communication: None Disposition Plan: [facility]  Awaiting bed availability Consultants:  Psychiatry and nephrology.  Procedures: CRRT , dc'd  Antimicrobials: sp Ancef for 10 days.  Subjective:  NO complaints.  Aware that she is going to be going to a facility soon.  Interactive and pleasant.  Objective: Vitals:   08/08/18 0834 08/08/18 1328 08/08/18 2152 08/09/18 0506  BP: 126/76 119/86 (!) 145/96 122/75  Pulse: (!) 109 (!) 105 99 (!) 114  Resp:  18 16 20   Temp: 99.1 F (37.3 C) 98.5 F (36.9 C) 99.8 F (37.7 C) 99.6 F (37.6 C)  TempSrc:   Oral Oral  SpO2: 98%  99% 100% 96%  Weight:      Height:        Intake/Output Summary (Last 24 hours) at 08/09/2018 1411 Last data filed at 08/09/2018 1020 Gross per 24 hour  Intake 1290 ml  Output -  Net 1290 ml   Filed Weights   08/05/18 0423 08/06/18 0445 08/07/18 0943  Weight: 130.5 kg 130.4 kg 133.2 kg    Examination:  General exam: Appears calm and comfortable  Respiratory system: Clear to auscultation. Respiratory effort normal. Cardiovascular system: S1 & S2 heard, RRR. No JVD, murmurs, rubs, gallops or clicks. No pedal edema. Gastrointestinal system: Abdomen is nondistended, soft and nontender. No organomegaly or masses felt. Normal bowel sounds heard. Central nervous system: Awake follows commands.  Extremities: Symmetric 5 x 5 power. Skin: No rashes, lesions or ulcers  Data Reviewed: I have personally reviewed following labs and imaging studies  CBC: Recent Labs  Lab 08/04/18 1022 08/05/18 0939 08/06/18 0546 08/07/18 0352 08/08/18 0352  WBC 9.9 10.6* 7.9 7.2 6.3  NEUTROABS  --   --   --   --  4.2  HGB 8.1* 8.4* 8.2* 8.7* 8.4*  HCT 26.6* 27.8* 27.8* 28.4* 27.7*  MCV 86.9 88.5 88.3 87.9 87.9  PLT 170 184 194 161 696   Basic Metabolic Panel: Recent Labs  Lab 08/03/18 0415  08/05/18 1100  08/06/18 0546 08/06/18 2151 08/07/18 0352 08/07/18 0903 08/08/18 0352 08/09/18 0521  NA 143   < > 146*   < > 148* 146* 145 147* 147* 146*  K 2.8*   < > 3.0*   < > 3.2* 3.4* 4.6 3.5 3.4* 3.3*  CL 88*   < > 93*   < > 97* 102 103 102 105 106  CO2 45*   < > 42*   < > 39* 35* 33* 34* 33* 29  GLUCOSE 218*   < > 211*   < > 202* 281* 194* 220* 143* 110*  BUN 21*   < > 45*   < > 41* 34* 34* 30* 26* 18  CREATININE 3.33*   < > 2.43*   < > 1.97* 1.79* 1.66* 1.62* 1.48* 1.29*  CALCIUM 8.5*   < > 8.7*   < > 8.8* 9.0 8.6* 9.0 8.7* 8.8*  MG 2.2  --   --   --   --   --   --   --   --   --   PHOS 4.0   < > 3.9  --  4.0  --  3.5  --  3.6 3.5   < > = values in this interval not displayed.   Liver  Function Tests: Recent Labs  Lab 08/05/18 1100 08/06/18 0546 08/07/18 0352 08/08/18 0352 08/09/18 0521  ALBUMIN 2.2* 2.2* 2.3* 2.3* 2.3*   Coagulation Profile: No results for input(s): INR, PROTIME in the last 168 hours. CBG: Recent Labs  Lab 08/08/18 1148 08/08/18 1658 08/08/18 2150 08/09/18 0752 08/09/18 1211  GLUCAP 226* 139* 182* 117* 204*     Radiology Studies: No results found. Scheduled Meds: . chlorhexidine  15 mL Mouth Rinse BID  . dextromethorphan-guaiFENesin  1 tablet Oral BID  . divalproex  500 mg Oral QHS  . docusate sodium  100 mg Oral Daily  . Gerhardt's butt cream   Topical BID  . insulin aspart  0-20 Units Subcutaneous TID WC  . insulin detemir  24 Units Subcutaneous Q12H  . levothyroxine  125 mcg Oral QAC breakfast  . multivitamin with minerals  1 tablet Oral Daily  . pantoprazole  40 mg Oral Daily  . potassium chloride  40 mEq Oral Q4H  . risperiDONE  3 mg Per Tube QHS   Continuous Infusions: . sodium chloride 250 mL (08/09/18 0557)     LOS: 16 days

## 2018-08-09 NOTE — Care Management Note (Signed)
Case Management Note  Patient Details  Name: SKYA MCCULLUM MRN: [id number] Date of Birth: 29-Aug-1971  Subjective/Objective:         Admitted with  DKA, acute metabolic  encephalopathy, acute on CKD failure. Hx of hypertension, lupus, goiter, paranoid schizophrenia, diabetes. From home with husband.      -- hospital course required HD CVVHD from 8/1-8/5 in setting of DKA and severe hypovolemia   Action/Plan: CIR unable to accept pt ... Pt aggreeable to SNF. CSW managing disposition to SNF. NCM will continue to follow.  Expected Discharge Date:  07/31/18               Expected Discharge Plan:  Skilled Nursing Facility  In-House Referral:  Clinical Social Work  Discharge planning Services  CM Consult  Post Acute Care Choice:    Choice offered to:     DME Arranged:    DME Agency:     HH Arranged:    [place] Agency:     Status of Service:  In process, will continue to follow  If discussed at Long Length of Stay Meetings, dates discussed:    Additional Comments:  Sharin Mons, RN 08/09/2018, 10:23 AM

## 2018-08-09 NOTE — Progress Notes (Signed)
CSW met with patient and patient's husband at bedside today to discuss bed offers. Patient's husband has chosen [facility], as it is the facility that's closest to where they live that has offered a bed. CSW contacted Admissions at [facility] to initiate insurance authorization request through [employer]. CSW informed patient's husband that she'll keep him updated on auth status.  CSW to follow.  Laveda Abbe, [place] Clinical Social Worker [phone]

## 2018-08-09 NOTE — Progress Notes (Signed)
  Speech Language Pathology Treatment: Dysphagia  Patient Details Name: [name] MRN: [id number] DOB: 06-14-1971 Today's Date: 08/09/2018 Time: 1040-4591 SLP Time Calculation (min) (ACUTE ONLY): 20 min  Assessment / Plan / Recommendation Clinical Impression  Pt seen for follow up after FEES completed 08/06/18. Pt reports some coughing with po intake, however, she has been afebrile and lungs are documented as clear. CXR completed 09/07/18 was normal. Pt was observed self-feeding puree consistency. No overt s/s aspiration were observed during po trial. SLP reviewed posted safe swallow precautions with pt, and recommended continued adherence to them. ST will continue to follow to assess diet tolerance and readiness to repeat objective study.    HPI HPI: Pt is a 47 y.o. female with PMH of HTN, lupus, goiter, paranoid schizophrenia, graves disease, and DM, admitted to [place] ED with AMS. Found to have hyperglycemia with blood sugars of 1556, anion gap acidosis, elevated lactic acid. ETT 7/27-8/3.        SLP Plan  Continue with current plan of care       Recommendations  Diet recommendations: Dysphagia 1 (puree);Honey-thick liquid Liquids provided via: Teaspoon Medication Administration: Via alternative means Supervision: Staff to assist with self feeding Compensations: Minimize environmental distractions;Slow rate;Small sips/bites;Clear throat intermittently Postural Changes and/or Swallow Maneuvers: Seated upright 90 degrees;Upright 30-60 min after meal                Oral Care Recommendations: Oral care QID Follow up Recommendations: 24 hour supervision/assistance SLP Visit Diagnosis: Dysphagia, oropharyngeal phase (R13.12) Plan: Continue with current plan of care       GO              [name] B. [name] [name], CCC-SLP Speech Language Pathologist [phone]  [name] 08/09/2018, 4:41 PM

## 2018-08-10 LAB — GLUCOSE, CAPILLARY
Glucose-Capillary: 129 mg/dL — ABNORMAL HIGH (ref 70–99)
Glucose-Capillary: 156 mg/dL — ABNORMAL HIGH (ref 70–99)
Glucose-Capillary: 119 mg/dL — ABNORMAL HIGH (ref 70–99)
Glucose-Capillary: 137 mg/dL — ABNORMAL HIGH (ref 70–99)

## 2018-08-10 LAB — RENAL FUNCTION PANEL
Albumin: 2.3 g/dL — ABNORMAL LOW (ref 3.5–5.0)
Anion gap: 10 (ref 5–15)
BUN: 11 mg/dL (ref 6–20)
CO2: 28 mmol/L (ref 22–32)
Calcium: 8.6 mg/dL — ABNORMAL LOW (ref 8.9–10.3)
Chloride: 106 mmol/L (ref 98–111)
Creatinine, Ser: 1.29 mg/dL — ABNORMAL HIGH (ref 0.44–1.00)
GFR calc Af Amer: 55 mL/min — ABNORMAL LOW
GFR calc non Af Amer: 48 mL/min — ABNORMAL LOW
Glucose, Bld: 115 mg/dL — ABNORMAL HIGH (ref 70–99)
Phosphorus: 3.6 mg/dL (ref 2.5–4.6)
Potassium: 4 mmol/L (ref 3.5–5.1)
Sodium: 144 mmol/L (ref 135–145)

## 2018-08-10 NOTE — Progress Notes (Signed)
  Speech Language Pathology Treatment: Dysphagia  Patient Details Name: IRISHA GRANDMAISON MRN: [id number] DOB: 1971/01/02 Today's Date: 08/10/2018 Time: 2725-3664 SLP Time Calculation (min) (ACUTE ONLY): 15 min  Assessment / Plan / Recommendation Clinical Impression  Pt seen during lunch to assess diet tolerance and determine readiness to advance diet. RN reports poor po intake due to restricted choices (puree and honey thick liquids). Pt was given trials of thin liquid via cup sip, and graham cracker. Small bites/sips were taken at a slow rate. No oral difficulty or residue observed or reported, and no overt s/s aspiration exhibited. Recommend repeat FEES to determine if pt is safe for advanced solid and liquid consistencies. RN and MD notified.     HPI HPI: Pt is a 47 y.o. female with PMH of HTN, lupus, goiter, paranoid schizophrenia, graves disease, and DM, admitted to [place] ED with AMS. Found to have hyperglycemia with blood sugars of 1556, anion gap acidosis, elevated lactic acid. ETT 7/27-8/3.        SLP Plan  (repeat FEES)       Recommendations  Diet recommendations: Dysphagia 1 (puree);Honey-thick liquid Liquids provided via: Teaspoon Medication Administration: Via alternative means Supervision: Staff to assist with self feeding;Patient able to self feed Compensations: Minimize environmental distractions;Slow rate;Small sips/bites;Clear throat intermittently Postural Changes and/or Swallow Maneuvers: Seated upright 90 degrees;Upright 30-60 min after meal                Oral Care Recommendations: Oral care QID Follow up Recommendations: 24 hour supervision/assistance SLP Visit Diagnosis: Dysphagia, oropharyngeal phase (R13.12) Plan: (repeat FEES)       GO               Celia B. [name] [facility], [place] Speech Language Pathologist [phone]  [name] 08/10/2018, 2:03 PM

## 2018-08-10 NOTE — Progress Notes (Signed)
PROGRESS NOTE    SHANAI LARTIGUE  QAS:[id number] DOB: 1971-03-04 DOA: 07/24/2018 PCP: Celene Squibb, MD   Brief Narrative:47 year old with history of hypertension, lupus, goiter, paranoid schizophrenia, diabetes went to [facility] ED with altered mental status. EMS called by family as she was sick for 3 days and lying on the couch. Patient had seizure activity with EMS and given 5 mg of Versed. Found to have hyperglycemia with blood sugars of 1556, anion gap acidosis, elevated lactic acid. Intubated for altered mental status in the ED and transferred to [facility]. There are no prior history of seizures. She had not been taking any of her other medications except psychiatric medications for the past 2 to 3 months.  Patient was admitted with severe DKA, intubated on admission in setting of acute metabolic encephalopathy. Patient develops fever the morning of July 30 th. She presented with acute on CKD failure with cr at 7.7. Prior cr per records 3 years ago was at 1.1. On July 31, renal function continue to worse, patient [facility] catheter placement. patient was started on CRRT. Patient was extubated 8-03. She develops mild post extubation stridor which resolve with racemic epi and decadron.   Today she appears comfortable, not in any kind of distress. SLP evaluation recommended dysphagia 1 diet with honey thick liquids.  Assessment & Plan:   Principal Problem:   Schizoaffective disorder, bipolar type ([place]) Active Problems:   DKA (diabetic ketoacidosis) ([place])   DKA (diabetic ketoacidoses) ([place])   Acute respiratory failure (HCC)   AKI (acute kidney injury) ([place])   Altered mental status   Hypokalemia   Essential hypertension   Lupus ([place])   Poorly controlled type 2 diabetes mellitus ([place])   Leukocytosis    Home meds:  - norvasc 10 qd/ hydralazine 50 tid/ metoprolol 50 bid/ lisinopril-HCT 10-12.5 qd  - divalproex 1gm qhs/ paliperidone 234 IM q 28d/ risperidone 3 mg hs/ trazodone  100 hs   - cogentin 0.5 bid/ clonazepam 0.5 mg prn bid/ ativan 0.5mg  tid prn/ ambien 5 mg hs prn  - synthroid 125 ug qd/ ativan 0.5 tid prn/ Kdur 20 bid  - metformin xr 500 mg qam       Hospital Course:  Debility: difficultly w/ balance and ambulating, PT recommending CIR. Pending approval. Medically ready  Diabetic ketoacidosis: admitting diagnosis/ issue. Resolved hemoglobin A1c around 16.  2/2 noncompliance. - cont Levemir 24 u bid here , was on only metformin at home (stopped) - sliding scale  Acute hypoxic resp failure d/t L basilar pneumonia: MSSA grew from sputum culture. BLood cx's were negative. Pt was Intubated for altered mental status and DKA.  - Completed Ancef for a total of 10 days.   History of schizophrenia: noncompliant w/ medications.  Psychiatry consulted on 08/05/18. They  recommended to restart Depakote, and Invega (if pt was still getting Invega monthly injections). We don't know, psych didn't f/u with this. Currently patient is doing well here on the following regimen >>   - depakote 500 qd (was on 1000mg  qd)   - risperdal 3 mg hs   - prn clonazepam 0.5 bid (not using)  - not getting home trazodone/ cogentin/ invega/ ativan/ ambien  AKI: she was briefly on CRRT.  Hemodialysis on hold, creatinine improving nephrology signed off.  creatinine 7.7 >> 1.4  SP hypotension:  resolved she was briefly on pressors which has been stopped since 07/31/2018.  Hypokalemia- resolved  Status post acute metabolic encephalopathy:  secondary to DKA and  AKI which is improved.     8] hypothyroidism continue Synthroid.  9] anemia of chronic disease hemoglobin has remained stable.   Kelly Splinter MD [facility] pgr [phone] 05/23/2018, 9:25 AM   DVT prophylaxis: SCD Code Status: Full code Family Communication: None Disposition Plan: [facility]  Awaiting CIR response Consultants:  Psychiatry and nephrology.  Procedures: CRRT , dc'd  Antimicrobials: sp Ancef for  10 days.  Subjective:  NO complaints. Working w/ PT.   Objective: Vitals:   08/09/18 1452 08/09/18 2046 08/09/18 2200 08/10/18 0637  BP: 139/90 (!) 133/102 (!) 138/95 (!) 138/91  Pulse: 86 (!) 102  (!) 113  Resp: 20 19  16   Temp: 98.6 F (37 C) 99.3 F (37.4 C)  98.8 F (37.1 C)  TempSrc: Oral Oral    SpO2: 95% 98%  94%  Weight:      Height:        Intake/Output Summary (Last 24 hours) at 08/10/2018 1625 Last data filed at 08/10/2018 0602 Gross per 24 hour  Intake 240 ml  Output -  Net 240 ml   Filed Weights   08/05/18 0423 08/06/18 0445 08/07/18 0943  Weight: 130.5 kg 130.4 kg 133.2 kg    Examination:  General exam: Appears calm and comfortable  Respiratory system: Clear to auscultation. Respiratory effort normal. Cardiovascular system: S1 & S2 heard, RRR. No JVD, murmurs, rubs, gallops or clicks. No pedal edema. Gastrointestinal system: Abdomen is nondistended, soft and nontender. No organomegaly or masses felt. Normal bowel sounds heard. Central nervous system: Awake follows commands.  Extremities: Symmetric 5 x 5 power. Skin: No rashes, lesions or ulcers  Data Reviewed: I have personally reviewed following labs and imaging studies  CBC: Recent Labs  Lab 08/04/18 1022 08/05/18 0939 08/06/18 0546 08/07/18 0352 08/08/18 0352  WBC 9.9 10.6* 7.9 7.2 6.3  NEUTROABS  --   --   --   --  4.2  HGB 8.1* 8.4* 8.2* 8.7* 8.4*  HCT 26.6* 27.8* 27.8* 28.4* 27.7*  MCV 86.9 88.5 88.3 87.9 87.9  PLT 170 184 194 161 301   Basic Metabolic Panel: Recent Labs  Lab 08/06/18 0546  08/07/18 0352 08/07/18 0903 08/08/18 0352 08/09/18 0521 08/10/18 0335  NA 148*   < > 145 147* 147* 146* 144  K 3.2*   < > 4.6 3.5 3.4* 3.3* 4.0  CL 97*   < > 103 102 105 106 106  CO2 39*   < > 33* 34* 33* 29 28  GLUCOSE 202*   < > 194* 220* 143* 110* 115*  BUN 41*   < > 34* 30* 26* 18 11  CREATININE 1.97*   < > 1.66* 1.62* 1.48* 1.29* 1.29*  CALCIUM 8.8*   < > 8.6* 9.0 8.7* 8.8* 8.6*    PHOS 4.0  --  3.5  --  3.6 3.5 3.6   < > = values in this interval not displayed.   Liver Function Tests: Recent Labs  Lab 08/06/18 0546 08/07/18 0352 08/08/18 0352 08/09/18 0521 08/10/18 0335  ALBUMIN 2.2* 2.3* 2.3* 2.3* 2.3*   Coagulation Profile: No results for input(s): INR, PROTIME in the last 168 hours. CBG: Recent Labs  Lab 08/09/18 1211 08/09/18 1643 08/09/18 2042 08/10/18 0844 08/10/18 1220  GLUCAP 204* 170* 144* 129* 156*     Radiology Studies: No results found. Scheduled Meds: . chlorhexidine  15 mL Mouth Rinse BID  . dextromethorphan-guaiFENesin  1 tablet Oral BID  . divalproex  500 mg  Oral QHS  . docusate sodium  100 mg Oral Daily  . Gerhardt's butt cream   Topical BID  . insulin aspart  0-20 Units Subcutaneous TID WC  . insulin detemir  24 Units Subcutaneous Q12H  . levothyroxine  125 mcg Oral QAC breakfast  . multivitamin with minerals  1 tablet Oral Daily  . pantoprazole  40 mg Oral Daily  . risperiDONE  3 mg Per Tube QHS   Continuous Infusions:    LOS: 17 days

## 2018-08-10 NOTE — Progress Notes (Signed)
Physical Therapy Treatment Patient Details Name: [name] MRN: [id number] DOB: [birth year] Today's Date: 08/10/2018    History of Present Illness Pt is a 47 y.o. female with PMH of HTN, lupus, goiter, paranoid schizophrenia, and DM, admitted to [place] ED with AMS. Found to have hyperglycemia with blood sugars of 1556, anion gap acidosis, elevated lactic acid. ETT 7/27-8/3.     PT Comments    Patient requires assistance for OOB mobility due to impaired balance. This session focused on gait training and pt tolerated increased gait distance before requiring seated rest break. Pt with LOB when turning with use of RW and required assistance to recover. Continue to progress as tolerated.    Follow Up Recommendations  CIR;Supervision for mobility/OOB     Equipment Recommendations  (TBD)    Recommendations for Other Services Rehab consult     Precautions / Restrictions Precautions Precautions: Fall    Mobility  Bed Mobility Overal bed mobility: Modified Independent             General bed mobility comments: increased time and effort  Transfers Overall transfer level: Needs assistance Equipment used: None Transfers: Sit to/from Stand Sit to Stand: Min assist         General transfer comment: assist to power up into standing from EOB and min guard for recliner; cues for hand placement  Ambulation/Gait Ambulation/Gait assistance: +2 safety/equipment;Min assist;Mod assist   Assistive device: Rolling walker (2 wheeled) Gait Pattern/deviations: Drifts right/left;Step-through pattern;Decreased step length - right;Decreased step length - left;Wide base of support(lateral sway) Gait velocity: Decreased   General Gait Details: cues for posture and stride length; pt requires assistance for balance and with LOB when turning   Stairs             Wheelchair Mobility    Modified Rankin (Stroke Patients Only)       Balance Overall balance assessment: Needs  assistance Sitting-balance support: No upper extremity supported;Feet supported Sitting balance-Leahy Scale: Good     Standing balance support: Bilateral upper extremity supported;During functional activity Standing balance-Leahy Scale: Poor                              Cognition Arousal/Alertness: Awake/alert Behavior During Therapy: Flat affect Overall Cognitive Status: No family/caregiver present to determine baseline cognitive functioning                                        Exercises      General Comments        Pertinent Vitals/Pain Pain Assessment: No/denies pain    Home Living                      Prior Function            PT Goals (current goals can now be found in the care plan section) Acute Rehab PT Goals PT Goal Formulation: With patient Time For Goal Achievement: 08/17/18 Potential to Achieve Goals: Good Progress towards PT goals: Progressing toward goals    Frequency    Min 3X/week      PT Plan Current plan remains appropriate    Co-evaluation              AM-PAC PT "6 Clicks" Daily Activity  Outcome Measure  Difficulty turning over in bed (including adjusting bedclothes, sheets and blankets)?:  A Little Difficulty moving from lying on back to sitting on the side of the bed? : A Little Difficulty sitting down on and standing up from a chair with arms (e.g., wheelchair, bedside commode, etc,.)?: A Little Help needed moving to and from a bed to chair (including a wheelchair)?: A Little Help needed walking in hospital room?: A Little Help needed climbing 3-5 steps with a railing? : A Lot 6 Click Score: 17    End of Session Equipment Utilized During Treatment: Gait belt Activity Tolerance: Patient tolerated treatment well Patient left: in chair;with call bell/phone within reach;with chair alarm set Nurse Communication: Mobility status PT Visit Diagnosis: Other abnormalities of gait and mobility  (R26.89);Muscle weakness (generalized) (M62.81)     Time: 1030-1045 PT Time Calculation (min) (ACUTE ONLY): 15 min  Charges:  $Gait Training: 8-22 mins                     Earney Navy, PTA Pager: [phone]     Darliss Cheney 08/10/2018, 12:46 PM

## 2018-08-11 DIAGNOSIS — F250 Schizoaffective disorder, bipolar type: Secondary | ICD-10-CM

## 2018-08-11 LAB — GLUCOSE, CAPILLARY
Glucose-Capillary: 111 mg/dL — ABNORMAL HIGH (ref 70–99)
Glucose-Capillary: 282 mg/dL — ABNORMAL HIGH (ref 70–99)

## 2018-08-11 MED ORDER — ACETAMINOPHEN 160 MG/5ML PO SOLN: 650.0000 mg | Freq: Four times a day (QID) | ORAL | 0 refills | Status: AC | PRN

## 2018-08-11 MED ORDER — TRAZODONE HCL 100 MG PO TABS: 100.0000 mg | ORAL_TABLET | Freq: Every evening | ORAL | 0 refills | Status: DC | PRN

## 2018-08-11 MED ORDER — INSULIN DETEMIR 100 UNIT/ML ~~LOC~~ SOLN: 24.0000 [IU] | Freq: Two times a day (BID) | SUBCUTANEOUS | 11 refills | Status: AC

## 2018-08-11 MED ORDER — CLONAZEPAM 0.5 MG PO TABS: 0.5000 mg | ORAL_TABLET | Freq: Two times a day (BID) | ORAL | 0 refills | Status: AC | PRN

## 2018-08-11 MED ORDER — IPRATROPIUM-ALBUTEROL 0.5-2.5 (3) MG/3ML IN SOLN: 3.0000 mL | RESPIRATORY_TRACT | Status: AC | PRN

## 2018-08-11 MED ORDER — PANTOPRAZOLE SODIUM 40 MG PO TBEC: 40.0000 mg | DELAYED_RELEASE_TABLET | Freq: Every day | ORAL | Status: AC

## 2018-08-11 MED ORDER — TRAZODONE HCL 50 MG PO TABS: 50.0000 mg | ORAL_TABLET | Freq: Every evening | ORAL | Status: AC | PRN

## 2018-08-11 MED ORDER — CLONAZEPAM 0.5 MG PO TABS: 0.5000 mg | ORAL_TABLET | Freq: Two times a day (BID) | ORAL | 0 refills | Status: DC

## 2018-08-11 MED ORDER — INSULIN ASPART 100 UNIT/ML ~~LOC~~ SOLN: 0.0000 [IU] | Freq: Three times a day (TID) | SUBCUTANEOUS | 11 refills | Status: AC

## 2018-08-11 NOTE — Progress Notes (Signed)
Gillermina Hu to be D/C'd Skilled nursing facility per MD order.  Discussed with the patient and all questions fully answered.  VSS, Skin clean, dry and intact without evidence of skin break down, no evidence of skin tears noted. IV catheter discontinued intact. Site without signs and symptoms of complications. Dressing and pressure applied.  Report called to Anderson Malta, Freight forwarder at [facility]. All questions answered.  Pt D/C'd with [facility].  [place] 08/11/2018 4:54 PM

## 2018-08-11 NOTE — Progress Notes (Signed)
CSW received call from [facility]- patient has received authorization for facilty. CSW has notified RN and paged MD for discharge summary if medically ready. CSW will notify patient and family of disposition plans. CSW will schedule transportation once DC summary has been completed.   Kingsley Spittle, LCSW Clinical Social Worker  [phone]

## 2018-08-11 NOTE — Progress Notes (Signed)
Occupational Therapy Treatment Patient Details Name: [name] MRN: [id number] DOB: 1971-06-15 Today's Date: 08/11/2018    History of present illness Pt is a 47 y.o. female with PMH of HTN, lupus, goiter, paranoid schizophrenia, and DM, admitted to [facility] ED with AMS. Found to have hyperglycemia with blood sugars of 1556, anion gap acidosis, elevated lactic acid. ETT 7/27-8/3.    OT comments  Pt completed toileting on 3 in 1, sponge bathing and dressing sit<>stand from 3 in 1 placed in front of sink. Pt requiring only set up to min guard assist, but needing verbal cues for task persistence and thoroughness. Pt with decreased problem solving, in soiled bed without having alerted nursing staff. Updated d/c disposition to SNF.   Follow Up Recommendations  Supervision/Assistance - 24 hour;SNF    Equipment Recommendations  Other (comment)(defer to next venue)    Recommendations for Other Services      Precautions / Restrictions Precautions Precautions: Fall       Mobility Bed Mobility Overal bed mobility: Modified Independent             General bed mobility comments: HOB up  Transfers Overall transfer level: Needs assistance Equipment used: None Transfers: Sit to/from Stand Sit to Stand: Min guard         General transfer comment:  no physical assist, min guard for safety    Balance Overall balance assessment: Needs assistance   Sitting balance-Leahy Scale: Good Sitting balance - Comments: no LOB with donning socks     Standing balance-Leahy Scale: Fair Standing balance comment: no LOB in static standing                           ADL either performed or assessed with clinical judgement   ADL Overall ADL's : Needs assistance/impaired     Grooming: Wash/dry hands;Wash/dry face;Sitting;Set up   Upper Body Bathing: Minimal assistance;Sitting Upper Body Bathing Details (indicate cue type and reason): washed pt's back, cues for thoroughness Lower  Body Bathing: Min guard;Sit to/from stand Lower Body Bathing Details (indicate cue type and reason): verbal cues for thoroughness Upper Body Dressing : Set up;Sitting   Lower Body Dressing: Set up;Sitting/lateral leans Lower Body Dressing Details (indicate cue type and reason): socks Toilet Transfer: Min guard;Ambulation;BSC   Toileting- Water quality scientist and Hygiene: Min guard;Sit to/from stand       Functional mobility during ADLs: Min guard(bed to bathroom/sink) General ADL Comments: pt immediately willing to work with OT     Vision       Perception     Praxis      Cognition Arousal/Alertness: Awake/alert Behavior During Therapy: Flat affect Overall Cognitive Status: No family/caregiver present to determine baseline cognitive functioning                                 General Comments: pt with decreased task persistence and problem solving        Exercises     Shoulder Instructions       General Comments      Pertinent Vitals/ Pain       Pain Assessment: No/denies pain  Home Living                                          Prior Functioning/Environment  Frequency  Min 2X/week        Progress Toward Goals  OT Goals(current goals can now be found in the care plan section)  Progress towards OT goals: Progressing toward goals  Acute Rehab OT Goals Patient Stated Goal: Get stronger at rehab then get back home OT Goal Formulation: With patient Time For Goal Achievement: 08/17/18 Potential to Achieve Goals: Good  Plan Discharge plan needs to be updated    Co-evaluation                 AM-PAC PT "6 Clicks" Daily Activity     Outcome Measure   Help from another person eating meals?: A Little Help from another person taking care of personal grooming?: A Little Help from another person toileting, which includes using toliet, bedpan, or urinal?: A Little Help from another person bathing  (including washing, rinsing, drying)?: A Little Help from another person to put on and taking off regular upper body clothing?: None Help from another person to put on and taking off regular lower body clothing?: A Little 6 Click Score: 19    End of Session Equipment Utilized During Treatment: Gait belt  OT Visit Diagnosis: Unsteadiness on feet (R26.81);Other abnormalities of gait and mobility (R26.89);Muscle weakness (generalized) (M62.81);Other symptoms and signs involving cognitive function   Activity Tolerance Patient tolerated treatment well   Patient Left in chair;with call bell/phone within reach;with chair alarm set   Nurse Communication          Time: [id number] OT Time Calculation (min): 24 min  Charges: OT General Charges $OT Visit: 1 Visit OT Treatments $Self Care/Home Management : 23-37 mins  08/11/2018 [name], OTR/L Pager: [phone]   [name] 08/11/2018, 3:09 PM

## 2018-08-11 NOTE — Discharge Summary (Signed)
[name], is a 47 y.o. female  DOB [birth year]  MRN [id number].  Admission date:  07/24/2018  Admitting Physician  Tarry Kos, MD  Discharge Date:  08/11/2018   Primary MD  Celene Squibb, MD  Recommendations for primary care physician for things to follow:  -Please check CBC, BMP within 3 days  Admission Diagnosis  Dehydration [E86.0] Status epilepticus ([place]) [G40.901] Upper GI bleed [K92.2] AKI (acute kidney injury) ([place]) [N17.9] Occult GI bleeding [R19.5] Acute respiratory failure, unspecified whether with hypoxia or hypercapnia (HCC) [J96.00] Altered mental status, unspecified altered mental status type [R41.82] Diabetic ketoacidosis with coma associated with other specified diabetes mellitus ([place]) [E13.11] DKA (diabetic ketoacidosis) ([place]) [E13.10]   Discharge Diagnosis  Dehydration [E86.0] Status epilepticus ([place]) [G40.901] Upper GI bleed [K92.2] AKI (acute kidney injury) ([place]) [N17.9] Occult GI bleeding [R19.5] Acute respiratory failure, unspecified whether with hypoxia or hypercapnia (HCC) [J96.00] Altered mental status, unspecified altered mental status type [R41.82] Diabetic ketoacidosis with coma associated with other specified diabetes mellitus ([place]) [E13.11] DKA (diabetic ketoacidosis) ([place]) [E13.10]    Principal Problem:   Schizoaffective disorder, bipolar type ([place]) Active Problems:   DKA (diabetic ketoacidosis) ([place])   DKA (diabetic ketoacidoses) ([place])   Acute respiratory failure ([place])   AKI (acute kidney injury) ([place])   Altered mental status   Hypokalemia   Essential hypertension   Lupus ([place])   Poorly controlled type 2 diabetes mellitus ([place])   Leukocytosis      Past Medical History:  Diagnosis Date  . Anemia   . 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 Delusional disorder ([place])   . DVT (deep venous thrombosis) ([place]) 09/30/12   in upper  extremities b/l and LLE  . Fibroid tumor   . Graves disease   . 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 Arnoldo Morale on 12/13/2012.  Marland Kitchen Hypertension   . Lupus anticoagulant positive 10/02/2012  . Multinodular goiter 10/01/2012  . Paranoid schizophrenia ([place])   . Thyroid disease   . Thyroid nodule    s/p biopsy    Past Surgical History:  Procedure Laterality Date  . ABDOMINAL HYSTERECTOMY    . HEMATOMA EVACUATION  10/03/2012   Procedure: EVACUATION HEMATOMA;  Surgeon: Jonnie Kind, MD;  Location: [facility];  Service: Gynecology;  Laterality: N/A;  . SUPRACERVICAL ABDOMINAL HYSTERECTOMY  09/27/2012   Procedure: HYSTERECTOMY SUPRACERVICAL ABDOMINAL;  Surgeon: Jonnie Kind, MD;  Location: [facility];  Service: Gynecology;  Laterality: N/A;  . THYROIDECTOMY  12/13/2012   Procedure: THYROIDECTOMY;  Surgeon: Jamesetta So, MD;  Location: [facility];  Service: General;  Laterality: Bilateral;       History of present illness and  Hospital Course:     Kindly see H&P for history of present illness and admission details, please review complete Labs, Consult reports and Test reports for all details in brief  HPI  from the history and physical done on the day of admission 07/24/2018  47 year old with history  of hypertension, lupus, goiter, paranoid schizophrenia, diabetes went to [facility] ED with altered mental status.  EMS called by family as she was sick for 3 days and lying on the couch.  Patient had seizure activity with EMS and given 5 mg of Versed. Found to have hypoglycemia with blood sugars of 1556, anion gap acidosis, elevated lactic acid. Intubated for altered mental status in the ED and transferred to [facility].  As per the family she did not have any fevers, nausea, vomiting, diarrhea, pain prior to this.  They are not aware that she has diabetes but she does have metformin on her med list.  There are no prior history of seizures.  She had not been  taking any of her other medications except psychiatric medications for the past 2 to 3 months.  Family has noticed that she was having excessive urination over the past few weeks.     Hospital Course   47 year old with history of hypertension, lupus, goiter, paranoid schizophrenia, diabetes went to [facility] ED with altered mental status. EMS called by family as she was sick for 3 days and lying on the couch. Patient had seizure activity with EMS and given 5 mg of Versed. Found to have hyperglycemia with blood sugars of 1556, anion gap acidosis, elevated lactic acid. Intubated for altered mental status in the ED and transferred to [facility]. There are no prior history of seizures. She had not been taking any of her other medications except psychiatric medications for the past 2 to 3 months.  Patient was admitted with severe DKA, intubated on admission in setting of acute metabolic encephalopathy. Patient develops fever the morning of July 30 th. She presented with acute on CKD failure with cr at 7.7. Prior cr per records 3 years ago was at 1.1. On July 31, renal function continue to worse, patient [facility] catheter placement. patient was started on CRRT. Patient was extubated 8-03. She develops mild post extubation stridor which resolve with racemic epi and decadron.    Debility: difficultly w/ balance and ambulating, has been seen by PT, she will be discharged to SNF  Diabetic ketoacidosis: admitting diagnosis/ issue. Resolved hemoglobin A1c around 16.  2/2 noncompliance. - cont Levemir 24 u bid here , was on only metformin at home (stopped), continue with insulin sliding scale as well   Acute hypoxic resp failure d/t L basilar pneumonia: MSSA grew from sputum culture. BLood cx's were negative. Pt was Intubated for altered mental status and DKA.  - Completed Ancef for a total of 10 days.   History of schizophrenia: noncompliant w/ medications.  Psychiatry consulted on 08/05/18.  They  recommended to restart Depakote, and Invega (if pt was still getting Invega monthly injections). We don't know, psych didn't f/u with this. Currently patient is doing well here on the following regimen >>              - depakote 500 qd (was on 1000mg  qd)             - risperdal 3 mg hs             - prn clonazepam 0.5 bid (not using)             - not getting home trazodone/ cogentin/ invega/ ativan/ ambien  AKI: she was briefly on CRRT.  Hemodialysis on hold, creatinine improving nephrology signed off.  creatinine 7.7 >> 1.4  SP hypotension:  resolved she was briefly on pressors which  has been stopped since 07/31/2018.  She is on multiple antihypertensive medication, blood pressure has been controlled, no antihypertensive meds on discharge  Hypokalemia- resolved  Status post acute metabolic encephalopathy:  secondary to DKA and AKI which is improved.     hypothyroidism continue Synthroid.  anemia of chronic disease hemoglobin has remained stable.     Discharge Condition:  Stable     Follow UP  Contact information for after-discharge care    [facility] .   Service:  Skilled Nursing Contact information: [address] [place] [phone]                Discharge Instructions  and  Discharge Medications     Discharge Instructions    Discharge instructions   Complete by:  As directed    Follow with Primary MD Celene Squibb, MD or SNF physician in 3 days  Get CBC, CMP,  checked  by Primary MD next visit.    Activity: As tolerated with Full fall precautions use walker/cane & assistance as needed   Disposition SNF   Diet: Heart Healthy, carbohydrate modified, dysphagia 3 with thin liquid, with feeding assistance and aspiration precautions.  For Heart failure patients - Check your Weight same time everyday, if you gain over 2 pounds, or you develop in leg swelling, experience more  shortness of breath or chest pain, call your Primary MD immediately. Follow Cardiac Low Salt Diet and 1.5 lit/day fluid restriction.   On your next visit with your primary care physician please Get Medicines reviewed and adjusted.   Please request your Prim.MD to go over all Hospital Tests and Procedure/Radiological results at the follow up, please get all Hospital records sent to your Prim MD by signing hospital release before you go home.   If you experience worsening of your admission symptoms, develop shortness of breath, life threatening emergency, suicidal or homicidal thoughts you must seek medical attention immediately by calling 911 or calling your MD immediately  if symptoms less severe.  You Must read complete instructions/literature along with all the possible adverse reactions/side effects for all the Medicines you take and that have been prescribed to you. Take any new Medicines after you have completely understood and accpet all the possible adverse reactions/side effects.   Do not drive, operating heavy machinery, perform activities at heights, swimming or participation in water activities or provide baby sitting services if your were admitted for syncope or siezures until you have seen by Primary MD or a Neurologist and advised to do so again.  Do not drive when taking Pain medications.    Do not take more than prescribed Pain, Sleep and Anxiety Medications  Special Instructions: If you have smoked or chewed Tobacco  in the last 2 yrs please stop smoking, stop any regular Alcohol  and or any Recreational drug use.  Wear Seat belts while driving.   Please note  You were cared for by a hospitalist during your hospital stay. If you have any questions about your discharge medications or the care you received while you were in the hospital after you are discharged, you can call the unit and asked to speak with the hospitalist on call if the hospitalist that took care of you is  not available. Once you are discharged, your primary care physician will handle any further medical issues. Please note that NO REFILLS for any discharge medications will be authorized once you are discharged,  as it is imperative that you return to your primary care physician (or establish a relationship with a primary care physician if you do not have one) for your aftercare needs so that they can reassess your need for medications and monitor your lab values.   Increase activity slowly   Complete by:  As directed      Allergies as of 08/11/2018   No Known Allergies     Medication List    STOP taking these medications   amLODipine 10 MG tablet Commonly known as:  NORVASC   benztropine 0.5 MG tablet Commonly known as:  COGENTIN   hydrALAZINE 50 MG tablet Commonly known as:  APRESOLINE   hydrochlorothiazide 25 MG tablet Commonly known as:  HYDRODIURIL   ibuprofen 200 MG tablet Commonly known as:  ADVIL,MOTRIN   lisinopril-hydrochlorothiazide 10-12.5 MG tablet Commonly known as:  PRINZIDE,ZESTORETIC   LORazepam 0.5 MG tablet Commonly known as:  ATIVAN   metFORMIN 500 MG 24 hr tablet Commonly known as:  GLUCOPHAGE-XR   metoprolol tartrate 50 MG tablet Commonly known as:  LOPRESSOR   paliperidone 234 MG/1.5ML Susp injection Commonly known as:  INVEGA SUSTENNA   potassium chloride SA 20 MEQ tablet Commonly known as:  K-DUR,KLOR-CON   zolpidem 5 MG tablet Commonly known as:  AMBIEN     TAKE these medications   acetaminophen 160 MG/5ML solution Commonly known as:  TYLENOL Take 20.3 mLs (650 mg total) by mouth every 6 (six) hours as needed for fever (Temp > 100.5 F.).   clonazePAM 0.5 MG tablet Commonly known as:  KLONOPIN Take 1 tablet (0.5 mg total) by mouth 2 (two) times daily as needed (ANXIETY). What changed:    when to take this  reasons to take this   divalproex 125 MG capsule Commonly known as:  DEPAKOTE SPRINKLE Take 8 capsules (1,000 mg total) by  mouth at bedtime.   ferrous sulfate 325 (65 FE) MG tablet Take 325 mg by mouth 2 (two) times daily with a meal.   insulin aspart 100 UNIT/ML injection Commonly known as:  novoLOG Inject 0-20 Units into the skin 3 (three) times daily with meals.   insulin detemir 100 UNIT/ML injection Commonly known as:  LEVEMIR Inject 0.24 mLs (24 Units total) into the skin every 12 (twelve) hours.   ipratropium-albuterol 0.5-2.5 (3) MG/3ML Soln Commonly known as:  DUONEB Take 3 mLs by nebulization every 4 (four) hours as needed.   levothyroxine 125 MCG tablet Commonly known as:  SYNTHROID, LEVOTHROID Take 1 tablet (125 mcg total) by mouth daily.   pantoprazole 40 MG tablet Commonly known as:  PROTONIX Take 1 tablet (40 mg total) by mouth daily. Start taking on:  08/12/2018   risperidone 3 MG disintegrating tablet Commonly known as:  RISPERDAL M-TABS Take 1 tablet (3 mg total) by mouth at bedtime.   traZODone 50 MG tablet Commonly known as:  DESYREL Take 1 tablet (50 mg total) by mouth at bedtime as needed for sleep. What changed:    medication strength  how much to take  when to take this  reasons to take this  Another medication with the same name was removed. Continue taking this medication, and follow the directions you see here.   Vitamin D (Ergocalciferol) 50000 units Caps capsule Commonly known as:  DRISDOL Take 50,000 Units by mouth every Monday.         Diet and Activity recommendation: See Discharge Instructions above   Consults obtained -   Psychiatry, renal, PCCM  Major procedures and Radiology Reports - PLEASE review detailed and final reports for all details, in brief -   CRRT, intubated/extubated   Dg Chest 1 View  Result Date: 08/07/2018 CLINICAL DATA:  Cough. EXAM: CHEST  1 VIEW COMPARISON:  08/04/2018 and older exams. FINDINGS: Cardiac silhouette is normal in size. No mediastinal or hilar masses. No convincing adenopathy. Clear lungs.  No convincing  pleural effusion.  No pneumothorax. Skeletal structures are grossly intact. IMPRESSION: No active disease. Electronically Signed   By: Lajean Manes M.D.   On: 08/07/2018 12:02   Dg Abd 1 View  Result Date: 07/30/2018 CLINICAL DATA:  Orogastric tube placement. EXAM: ABDOMEN - 1 VIEW COMPARISON:  Radiographs of July 29, 2018. FINDINGS: The bowel gas pattern is normal. Distal tip of orogastric tube is seen in distal stomach. No radio-opaque calculi or other significant radiographic abnormality are seen. IMPRESSION: Distal tip of orogastric tube seen in distal stomach. No evidence of bowel obstruction or ileus. Electronically Signed   By: Marijo Conception, M.D.   On: 07/30/2018 08:59   Ct Head Wo Contrast  Result Date: 07/24/2018 CLINICAL DATA:  Altered mental status, unclear cause. Seizure activity. Patient responsive only to painful stimuli. Hyperglycemia. EXAM: CT HEAD WITHOUT CONTRAST TECHNIQUE: Contiguous axial images were obtained from the base of the skull through the vertex without intravenous contrast. COMPARISON:  None. FINDINGS: Brain: No acute infarct, hemorrhage, or mass lesion is present. The ventricles are of normal size. No significant white matter disease is present. The brainstem and cerebellum are within normal limits. No significant extraaxial fluid collection is present. Vascular: No hyperdense vessel or unexpected calcification. Skull: Calvarium is intact. No acute or healing fractures are present. No focal lytic or blastic lesions are present. Sinuses/Orbits: The paranasal sinuses and mastoid air cells are clear. Bilateral exophthalmos is noted. No retro-orbital mass is present. Globes and orbits are otherwise within normal limits. IMPRESSION: 1. Normal CT appearance of brain. 2. Bilateral exophthalmos.  Question thyroid disease. Electronically Signed   By: San Morelle M.D.   On: 07/24/2018 12:58   US Renal  Result Date: 07/25/2018 CLINICAL DATA:  Acute renal injury EXAM:  RENAL / URINARY TRACT ULTRASOUND COMPLETE COMPARISON:  01/12/2015 CT of the abdomen and pelvis FINDINGS: Right Kidney: Length: 13.9 cm. Echogenicity within normal limits. No mass or hydronephrosis visualized. Left Kidney: Length: 12.9 cm. Echogenicity within normal limits. No mass or hydronephrosis visualized. Bladder: Appears normal for degree of bladder distention. IMPRESSION: No acute abnormality identified. Electronically Signed   By: Inez Catalina M.D.   On: 07/25/2018 09:09   Dg Chest Port 1 View  Result Date: 08/04/2018 CLINICAL DATA:  Hypoxia, diabetes nonsmoker. EXAM: PORTABLE CHEST 1 VIEW COMPARISON:  Portable chest x-ray of August 01, 2018 FINDINGS: The lungs are mildly hypoinflated. The interstitial markings are coarse. The cardiac silhouette is enlarged but stable. The pulmonary vascularity is somewhat indistinct. There is no definite pleural effusion. The right internal jugular venous catheter tip projects over the junction of the proximal and middle thirds of the SVC. The feeding tube tip projects below the inferior margin of the image. IMPRESSION: Mild pulmonary hypoinflation results in some crowding of the lung markings. I cannot exclude low-grade CHF in the appropriate clinical setting. No alveolar pneumonia. Electronically Signed   By: David  Martinique M.D.   On: 08/04/2018 12:18   Dg Chest Port 1 View  Result Date: 08/01/2018 CLINICAL DATA:  Productive cough EXAM: PORTABLE CHEST 1 VIEW COMPARISON:  07/31/2018 FINDINGS:  Cardiac shadow is stable. Right jugular central line and feeding catheter are again noted. Nasogastric catheter and endotracheal tube have been removed in the interval. Postsurgical changes in the lower neck are seen. The lungs are clear. IMPRESSION: Interval clearing in the left base. Electronically Signed   By: Inez Catalina M.D.   On: 08/01/2018 07:55   Dg Chest Port 1 View  Result Date: 07/31/2018 CLINICAL DATA:  Respiratory failure EXAM: PORTABLE CHEST 1 VIEW COMPARISON:   07/30/2018 FINDINGS: Cardiac shadow is stable. Endotracheal tube, nasogastric catheter and right jugular central line are again seen and stable. Feeding catheter is noted as well. The lungs are well aerated bilaterally. Minimal left basilar atelectasis is seen. No bony abnormality is noted. IMPRESSION: Tubes and lines as described. Mild left basilar atelectasis is noted. Electronically Signed   By: Inez Catalina M.D.   On: 07/31/2018 07:48   Dg Chest Port 1 View  Result Date: 07/30/2018 CLINICAL DATA:  Follow-up respiratory failure. EXAM: PORTABLE CHEST 1 VIEW COMPARISON:  07/29/2018 FINDINGS: Endotracheal tube tip is 3 cm above the carina. Nasogastric tube extends only to the distal esophagus. Right internal jugular central line is unchanged with its tips in the SVC. Right lung is clear. Mild left lower lobe volume loss is slightly worsened. IMPRESSION: Mild worsening of left lower lobe volume loss. Nasogastric tube tip only in the distal esophagus. These results will be called to the ordering clinician or representative by the Radiologist Assistant, and communication documented in the PACS or zVision Dashboard. Electronically Signed   By: Nelson Chimes M.D.   On: 07/30/2018 07:27   Dg Chest Port 1 View  Result Date: 07/29/2018 CLINICAL DATA:  Respiratory failure. EXAM: PORTABLE CHEST 1 VIEW COMPARISON:  07/28/2018. FINDINGS: Endotracheal tube, NG tube, right IJ line stable position. Heart size stable. Bibasilar atelectasis. Small left pleural effusion, improved from prior exam. No pneumothorax. Surgical clips base of the neck. IMPRESSION: 1.  Lines and tubes in stable position. 2.  Small left pleural effusion.  Improved from prior exam. Electronically Signed   By: Marcello Moores  Register   On: 07/29/2018 08:56   Dg Chest Port 1 View  Result Date: 07/28/2018 CLINICAL DATA:  Central line placement. EXAM: PORTABLE CHEST 1 VIEW COMPARISON:  07/28/2018 at 0519 hours FINDINGS: Endotracheal tube terminates near the  superior margin of the clavicular heads. Enteric tube courses into the left upper abdomen with tip not imaged. A new right jugular catheter terminates over the mid upper SVC. The cardiomediastinal silhouette is unchanged. Mild right basilar opacity has improved and may reflect atelectasis. There is persistent left basilar consolidation, likely with a small left pleural effusion. No pneumothorax is identified. IMPRESSION: 1. Right jugular catheter placement as above.  No pneumothorax. 2. Improved aeration of the right lung base. Persistent left basilar consolidation and likely small left pleural effusion. Electronically Signed   By: Logan Bores M.D.   On: 07/28/2018 11:50   Dg Chest Port 1 View  Result Date: 07/28/2018 CLINICAL DATA:  Respiratory failure. EXAM: PORTABLE CHEST 1 VIEW COMPARISON:  Radiograph of July 27, 2018. FINDINGS: Stable cardiomegaly. Endotracheal and nasogastric tubes are unchanged in position. No pneumothorax is noted. Mild bibasilar opacities are noted consistent with subsegmental atelectasis or infiltrates. Probable mild left pleural effusion is noted. Bony thorax is unremarkable. IMPRESSION: Stable support apparatus. Bibasilar subsegmental atelectasis or infiltrates are noted. Small left pleural effusion is noted. Electronically Signed   By: Marijo Conception, M.D.   On: 07/28/2018 07:14  Dg Chest Port 1 View  Result Date: 07/27/2018 CLINICAL DATA:  Acute respiratory failure EXAM: PORTABLE CHEST 1 VIEW COMPARISON:  07/26/2018 FINDINGS: Cardiac shadow is stable. Nasogastric catheter is noted within the stomach. Endotracheal tube is seen in satisfactory position. The overall inspiratory effort is poor with persistent left basilar changes stable from the prior exam. No new bony abnormality is seen. IMPRESSION: Stable left basilar infiltrate. Tubes and lines as described. Electronically Signed   By: Inez Catalina M.D.   On: 07/27/2018 07:47   Dg Chest Port 1 View  Result Date:  07/26/2018 CLINICAL DATA:  Acute respiratory failure with hypoxia. Intubated patient. EXAM: PORTABLE CHEST 1 VIEW COMPARISON:  Portable chest x-ray of July 25, 2018 FINDINGS: The lungs are mildly hypoinflated. There is persistent increased density in the retrocardiac region and obscuring the left hemidiaphragm. The right lung is clear. The heart is top-normal in size. The pulmonary vascularity is mildly prominent. The endotracheal tube tip projects at the level of the clavicular heads approximately 4 cm above the carina. The esophagogastric tube appears to be coiled in a hiatal hernia with the tip in proximal port below the left hemidiaphragm. IMPRESSION: Left lower lobe atelectasis or pneumonia. Small left pleural effusion. Mild central pulmonary vascular prominence may reflect low-grade CHF. No significant interstitial edema. Coiling of the esophagogastric tube in the left retrocardiac region likely in a hiatal hernia. Electronically Signed   By: David  Martinique M.D.   On: 07/26/2018 07:03   Dg Chest Port 1 View  Result Date: 07/25/2018 CLINICAL DATA:  Acute respiratory failure EXAM: PORTABLE CHEST 1 VIEW COMPARISON:  07/24/2018 FINDINGS: Cardiac shadow is stable. Endotracheal tube is been withdrawn in the interval now lying in the distal trachea. The lungs are well aerated with the exception of minimal left basilar atelectasis. Nasogastric catheter is noted within the stomach. Postoperative changes in the neck are seen. No bony abnormality is noted. IMPRESSION: Tubes and lines in satisfactory position. Mild left basilar atelectasis is noted. Electronically Signed   By: Inez Catalina M.D.   On: 07/25/2018 06:56   Dg Chest Port 1 View  Addendum Date: 07/24/2018   ADDENDUM REPORT: 07/24/2018 11:19 ADDENDUM: These results were called by telephone at the time of interpretation on 07/24/2018 at 11:13 am to Dr. Francine Graven , who verbally acknowledged these results. Electronically Signed   By: Fidela Salisbury M.D.   On: 07/24/2018 11:19   Result Date: 07/24/2018 CLINICAL DATA:  Post intubation. EXAM: PORTABLE CHEST 1 VIEW COMPARISON:  Body CT 09/30/2012 FINDINGS: The endotracheal tube terminates in the proximal right mainstem bronchus. Retraction by approximately 3 cm is recommended. Enteric catheter tip overlies the expected location of gastric cardia. Cardiomediastinal silhouette is normal. Mediastinal contours appear intact. There is no evidence of pneumothorax. Low lung volumes with increased interstitial markings. Elevation of the left hemidiaphragm suggestive of atelectatic changes of the left lung. Osseous structures are without acute abnormality. Soft tissues are grossly normal. IMPRESSION: Right mainstem bronchus intubation. Suggested retraction of the endotracheal tube by approximately 3 cm. Low lung volumes bilaterally with more prominent atelectatic changes of the left lung. Electronically Signed: By: Fidela Salisbury M.D. On: 07/24/2018 11:07   Dg Abd Portable 1v  Result Date: 07/30/2018 CLINICAL DATA:  Bedside feeding tube placement. EXAM: PORTABLE ABDOMEN - 1 VIEW 5:49 p.m.: COMPARISON:  Portable abdomen x-ray earlier same day 2:52 p.m. and 8:40 a.m. FINDINGS: The feeding tube which was previously looped in the stomach now likely has its tip  in the fourth portion of the duodenum. The nasogastric tube tip is now in the expected location of the duodenal bulb as it is directed inferiorly. Visualized bowel gas pattern unremarkable. IMPRESSION: 1. Feeding tube tip now likely in the fourth portion of the duodenum. 2. Nasogastric tube tip now likely in the duodenal bulb. Electronically Signed   By: Evangeline Dakin M.D.   On: 07/30/2018 18:06   Dg Abd Portable 1v  Result Date: 07/30/2018 CLINICAL DATA:  Feeding tube placement EXAM: PORTABLE ABDOMEN - 1 VIEW COMPARISON:  07/30/2018 FINDINGS: NG tube extends into the gastric antrum. Interval placement of feeding tube which extends into the  stomach, loops in the gastric antrum, and tip positioned in the gastric body. IMPRESSION: Introduction feeding tube with tip in the gastric body. NG tube unchanged. Electronically Signed   By: Suzy Bouchard M.D.   On: 07/30/2018 15:07   Dg Abd Portable 1v  Result Date: 07/29/2018 CLINICAL DATA:  Vomiting EXAM: PORTABLE ABDOMEN - 1 VIEW COMPARISON:  None. FINDINGS: Scattered large and small bowel gas is noted. Nasogastric catheter is noted within the stomach. No obstructive changes are noted. No bony abnormality is seen. IMPRESSION: No acute abnormality noted. Electronically Signed   By: Inez Catalina M.D.   On: 07/29/2018 12:21    Micro Results   No results found for this or any previous visit (from the past 240 hour(s)).     Today   Subjective:   [name] today has no headache,no chest or abdominal pain,no new weakness tingling or numbness, feels better today.   Objective:   Blood pressure 124/79, pulse (!) 105, temperature 98.9 F (37.2 C), temperature source Oral, resp. rate 18, height 5\' 9"  (1.753 m), weight 133.2 kg, last menstrual period 09/18/2012, SpO2 94 %.   Intake/Output Summary (Last 24 hours) at 08/11/2018 1244 Last data filed at 08/11/2018 0907 Gross per 24 hour  Intake 300 ml  Output -  Net 300 ml    Exam Awake Alert, Oriented x 3, No new F.N deficits, Normal affect Symmetrical Chest wall movement, Good air movement bilaterally, CTAB RRR,No Gallops,Rubs or new Murmurs, No Parasternal Heave +ve B.Sounds, Abd Soft, Non tender,No rebound -guarding or rigidity. No Cyanosis, Clubbing or edema, No new Rash or bruise  Data Review   CBC w Diff:  Lab Results  Component Value Date   WBC 6.3 08/08/2018   HGB 8.4 (L) 08/08/2018   HCT 27.7 (L) 08/08/2018   PLT 185 08/08/2018   LYMPHOPCT 24 08/08/2018   BANDSPCT 0 07/18/2012   MONOPCT 7 08/08/2018   EOSPCT 1 08/08/2018   BASOPCT 0 08/08/2018    CMP:  Lab Results  Component Value Date   NA 144  08/10/2018   K 4.0 08/10/2018   CL 106 08/10/2018   CO2 28 08/10/2018   BUN 11 08/10/2018   CREATININE 1.29 (H) 08/10/2018   CREATININE 0.84 10/22/2012   PROT 6.0 (L) 07/26/2018   ALBUMIN 2.3 (L) 08/10/2018   BILITOT 0.7 07/26/2018   ALKPHOS 96 07/26/2018   AST 27 07/26/2018   ALT 32 07/26/2018  .   Total Time in preparing paper work, data evaluation and todays exam - 66 minutes  Phillips Climes M.D on 08/11/2018 at 12:44 PM  [facility]   Office  [phone]

## 2018-08-11 NOTE — Procedures (Addendum)
Objective Swallowing Evaluation: Type of Study: FEES-Fiberoptic Endoscopic Evaluation of Swallow   Patient Details  Name: [name] MRN: [id number] Date of Birth: 19-Oct-1971  Today's Date: 08/11/2018 Time: SLP Start Time (ACUTE ONLY): 1137 -SLP Stop Time (ACUTE ONLY): 1156  SLP Time Calculation (min) (ACUTE ONLY): 19 min   Past Medical History:  Past Medical History:  Diagnosis Date  . Anemia   . 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 Delusional disorder ([place])   . DVT (deep venous thrombosis) ([place]) 09/30/12   in upper extremities b/l and LLE  . Fibroid tumor   . Graves disease   . 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 Arnoldo Morale on 12/13/2012.  Marland Kitchen Hypertension   . Lupus anticoagulant positive 10/02/2012  . Multinodular goiter 10/01/2012  . Paranoid schizophrenia ([place])   . Thyroid disease   . Thyroid nodule    s/p biopsy   Past Surgical History:  Past Surgical History:  Procedure Laterality Date  . ABDOMINAL HYSTERECTOMY    . HEMATOMA EVACUATION  10/03/2012   Procedure: EVACUATION HEMATOMA;  Surgeon: Jonnie Kind, MD;  Location: [facility];  Service: Gynecology;  Laterality: N/A;  . SUPRACERVICAL ABDOMINAL HYSTERECTOMY  09/27/2012   Procedure: HYSTERECTOMY SUPRACERVICAL ABDOMINAL;  Surgeon: Jonnie Kind, MD;  Location: [facility];  Service: Gynecology;  Laterality: N/A;  . THYROIDECTOMY  12/13/2012   Procedure: THYROIDECTOMY;  Surgeon: Jamesetta So, MD;  Location: [facility];  Service: General;  Laterality: Bilateral;   HPI: Pt is a 47 y.o. female with PMH of HTN, lupus, goiter, paranoid schizophrenia, graves disease, and DM, admitted to [place] ED with AMS. Found to have hyperglycemia with blood sugars of 1556, anion gap acidosis, elevated lactic acid. ETT 7/27-8/3.     Subjective: wants to advance her diet    Assessment / Plan / Recommendation  CHL IP CLINICAL IMPRESSIONS 08/11/2018  Clinical Impression Pt's post-extubation  dysphagia has improved, with good airway protection observed across study. She continues to have a mild oral dysphagia with prolonged transit, piecemeal swallowing, and premature spillage. Thin liquids intermittently reach the pyriform sinuses before the swallow, with solids better contained in the valleculae. Mild vallecular residue is present. Recommend advancing diet to Dys 3 textures, thin liquids. Recommend additional f/u upon d/c for tolerance and further advancement.  SLP Visit Diagnosis Dysphagia, oropharyngeal phase (R13.12)  Attention and concentration deficit following --  Frontal lobe and executive function deficit following --  Impact on safety and function Mild aspiration risk      CHL IP TREATMENT RECOMMENDATION 08/11/2018  Treatment Recommendations Therapy as outlined in treatment plan below     Prognosis 08/11/2018  Prognosis for Safe Diet Advancement Good  Barriers to Reach Goals --  Barriers/Prognosis Comment --    CHL IP DIET RECOMMENDATION 08/11/2018  SLP Diet Recommendations Dysphagia 3 (Mech soft) solids;Thin liquid  Liquid Administration via Cup;Straw  Medication Administration Whole meds with puree  Compensations Minimize environmental distractions;Slow rate;Small sips/bites  Postural Changes Seated upright at 90 degrees      CHL IP OTHER RECOMMENDATIONS 08/11/2018  Recommended Consults --  Oral Care Recommendations Oral care BID  Other Recommendations --      CHL IP FOLLOW UP RECOMMENDATIONS 08/11/2018  Follow up Recommendations Skilled Nursing facility      [facility] IP FREQUENCY AND DURATION 08/11/2018  Speech Therapy Frequency (ACUTE ONLY) min 2x/week  Treatment Duration 2 weeks  CHL IP ORAL PHASE 08/11/2018  Oral Phase Impaired  Oral - Pudding Teaspoon --  Oral - Pudding Cup --  Oral - Honey Teaspoon NT  Oral - Honey Cup NT  Oral - Nectar Teaspoon Delayed oral transit  Oral - Nectar Cup --  Oral - Nectar Straw --  Oral - Thin Teaspoon WFL   Oral - Thin Cup Piecemeal swallowing;Premature spillage  Oral - Thin Straw Piecemeal swallowing;Premature spillage  Oral - Puree Piecemeal swallowing;Delayed oral transit  Oral - Mech Soft --  Oral - Regular Piecemeal swallowing;Delayed oral transit  Oral - Multi-Consistency --  Oral - Pill --  Oral Phase - Comment --    CHL IP PHARYNGEAL PHASE 08/11/2018  Pharyngeal Phase Impaired  Pharyngeal- Pudding Teaspoon --  Pharyngeal --  Pharyngeal- Pudding Cup --  Pharyngeal --  Pharyngeal- Honey Teaspoon NT  Pharyngeal --  Pharyngeal- Honey Cup NT  Pharyngeal --  Pharyngeal- Nectar Teaspoon Reduced tongue base retraction;Pharyngeal residue - valleculae  Pharyngeal Material does not enter airway  Pharyngeal- Nectar Cup --  Pharyngeal --  Pharyngeal- Nectar Straw --  Pharyngeal --  Pharyngeal- Thin Teaspoon Reduced tongue base retraction;Delayed swallow initiation-vallecula  Pharyngeal Material does not enter airway  Pharyngeal- Thin Cup Reduced tongue base retraction;Delayed swallow initiation-vallecula  Pharyngeal --  Pharyngeal- Thin Straw Reduced tongue base retraction;Delayed swallow initiation-pyriform sinuses  Pharyngeal --  Pharyngeal- Puree Reduced tongue base retraction;Pharyngeal residue - valleculae  Pharyngeal Material does not enter airway  Pharyngeal- Mechanical Soft --  Pharyngeal --  Pharyngeal- Regular Reduced tongue base retraction;Pharyngeal residue - valleculae  Pharyngeal --  Pharyngeal- Multi-consistency --  Pharyngeal --  Pharyngeal- Pill --  Pharyngeal --  Pharyngeal Comment --     CHL IP CERVICAL ESOPHAGEAL PHASE 08/11/2018  Cervical Esophageal Phase WFL  Pudding Teaspoon --  Pudding Cup --  Honey Teaspoon --  Honey Cup --  Nectar Teaspoon --  Nectar Cup --  Nectar Straw --  Thin Teaspoon --  Thin Cup --  Thin Straw --  Puree --  Mechanical Soft --  Regular --  Multi-consistency --  Pill --  Cervical Esophageal Comment --      Germain Osgood 08/11/2018, 12:20 PM    Germain Osgood, M.A. CCC-SLP [phone]

## 2018-08-11 NOTE — Discharge Instructions (Signed)
Follow with Primary MD Celene Squibb, MD or SNF physician in 3 days  Get CBC, CMP,  checked  by Primary MD next visit.    Activity: As tolerated with Full fall precautions use walker/cane & assistance as needed   Disposition SNF   Diet: Heart Healthy, carbohydrate modified, dysphagia 3 with thin liquid, with feeding assistance and aspiration precautions.  For Heart failure patients - Check your Weight same time everyday, if you gain over 2 pounds, or you develop in leg swelling, experience more shortness of breath or chest pain, call your Primary MD immediately. Follow Cardiac Low Salt Diet and 1.5 lit/day fluid restriction.   On your next visit with your primary care physician please Get Medicines reviewed and adjusted.   Please request your Prim.MD to go over all Hospital Tests and Procedure/Radiological results at the follow up, please get all Hospital records sent to your Prim MD by signing hospital release before you go home.   If you experience worsening of your admission symptoms, develop shortness of breath, life threatening emergency, suicidal or homicidal thoughts you must seek medical attention immediately by calling 911 or calling your MD immediately  if symptoms less severe.  You Must read complete instructions/literature along with all the possible adverse reactions/side effects for all the Medicines you take and that have been prescribed to you. Take any new Medicines after you have completely understood and accpet all the possible adverse reactions/side effects.   Do not drive, operating heavy machinery, perform activities at heights, swimming or participation in water activities or provide baby sitting services if your were admitted for syncope or siezures until you have seen by Primary MD or a Neurologist and advised to do so again.  Do not drive when taking Pain medications.    Do not take more than prescribed Pain, Sleep and Anxiety Medications  Special  Instructions: If you have smoked or chewed Tobacco  in the last 2 yrs please stop smoking, stop any regular Alcohol  and or any Recreational drug use.  Wear Seat belts while driving.   Please note  You were cared for by a hospitalist during your hospital stay. If you have any questions about your discharge medications or the care you received while you were in the hospital after you are discharged, you can call the unit and asked to speak with the hospitalist on call if the hospitalist that took care of you is not available. Once you are discharged, your primary care physician will handle any further medical issues. Please note that NO REFILLS for any discharge medications will be authorized once you are discharged, as it is imperative that you return to your primary care physician (or establish a relationship with a primary care physician if you do not have one) for your aftercare needs so that they can reassess your need for medications and monitor your lab values.

## 2018-08-11 NOTE — Progress Notes (Signed)
Patient is set to discharge to [facility] today. Patient aware and will notify family. Discharge packet given to RN, Ellard Artis. [facility] called for transport.   Kingsley Spittle, [place] Clinical Social Worker [phone]

## 2019-11-05 IMAGING — CT CT HEAD W/O CM
3 series · 15 of 47 positions shown, 18 images · non-contrast
Comparison: None.

CLINICAL DATA: Altered mental status, unclear cause. Seizure

EXAM:
CT HEAD WITHOUT CONTRAST
TECHNIQUE: Contiguous axial images were obtained from the base of the skull
through the vertex without intravenous contrast.

[Series 2: head wo · axial · 0.46mm/px · z∈[+1565,+1700]mm · 9 of 33 slices shown, 12 images]
[im 3/33  brain]
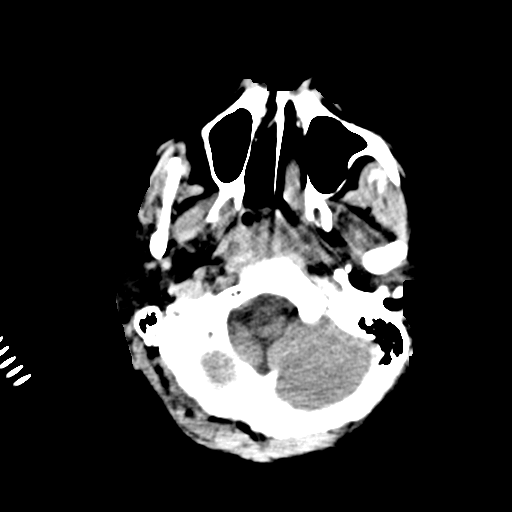
[im 3/33  bone]
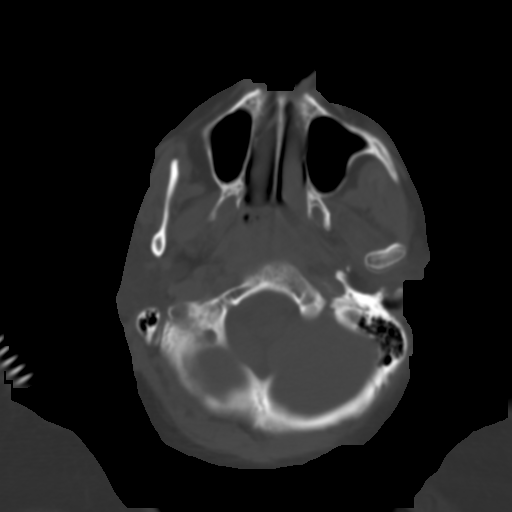
[im 6/33  brain]
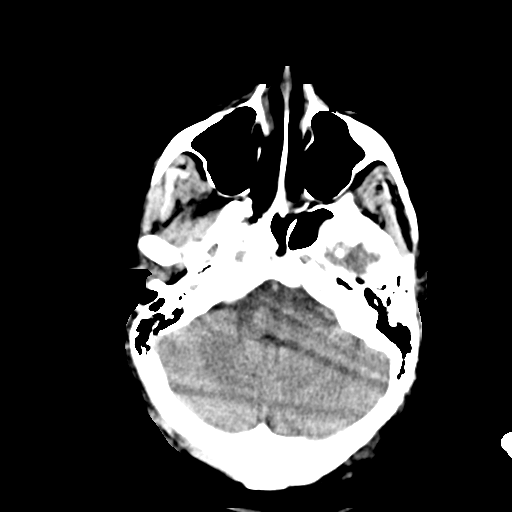
[im 9/33  brain]
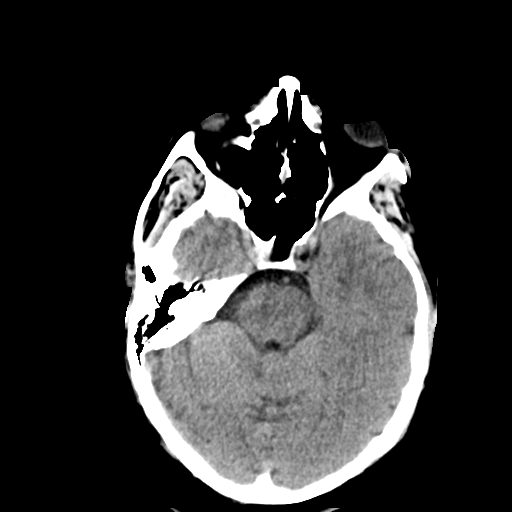
[im 13/33  brain]
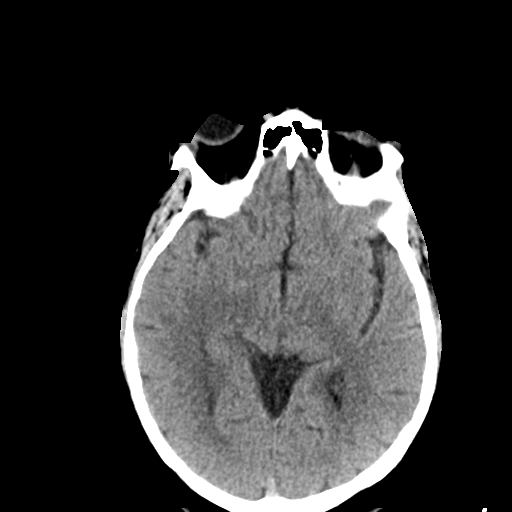
[im 17/33  brain]
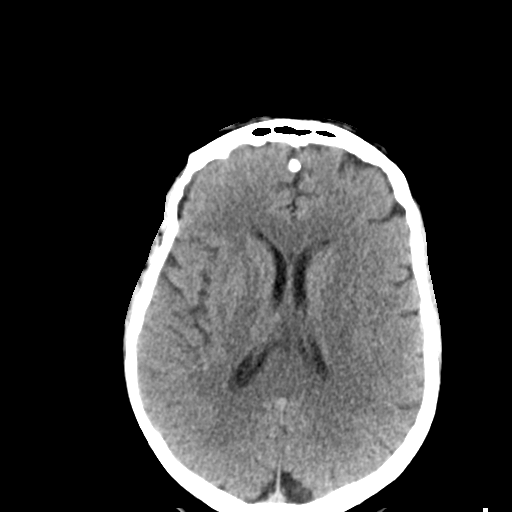
[im 17/33  bone]
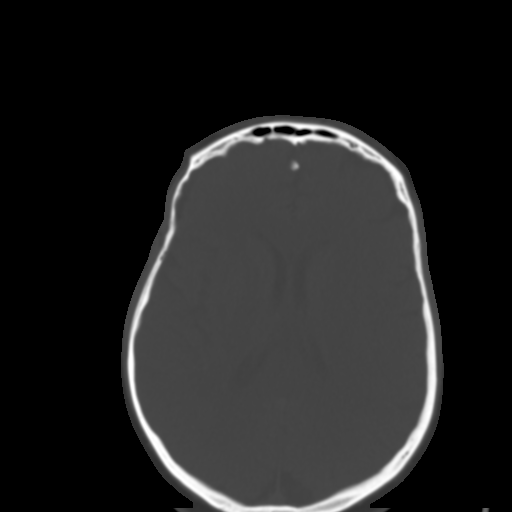
[im 20/33  brain]
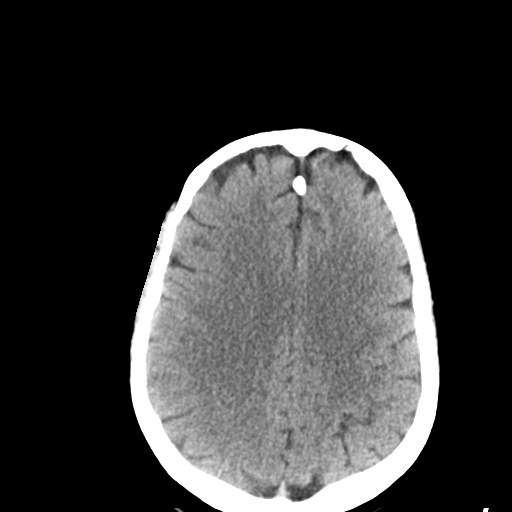
[im 24/33  brain]
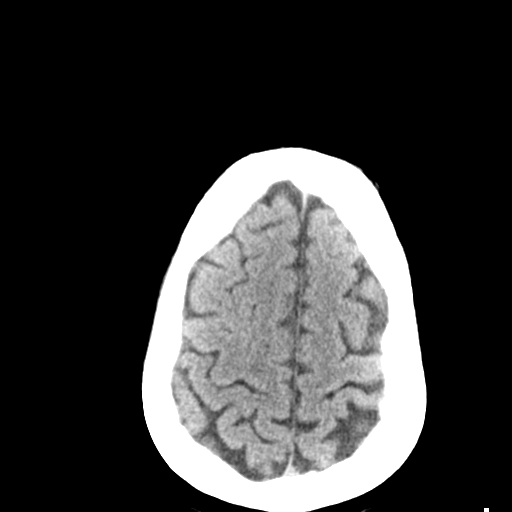
[im 27/33  brain]
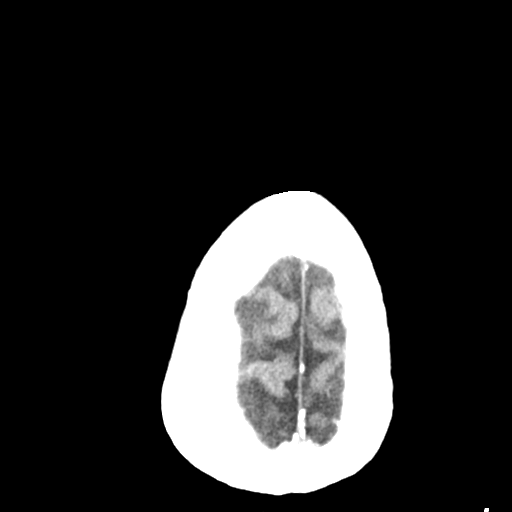
[im 30/33  brain]
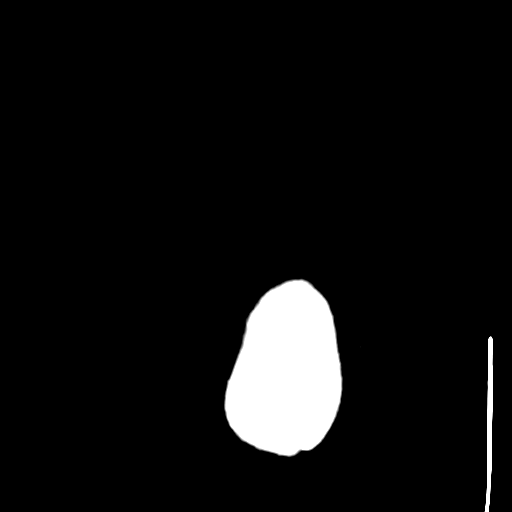
[im 30/33  bone]
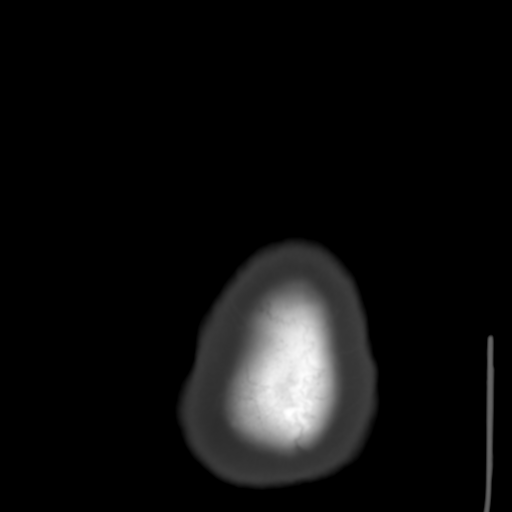

[Series 4: coronal soft tissue · coronal · 0.34mm/px · 3 of 74 slices shown]
[im 25/74  brain]
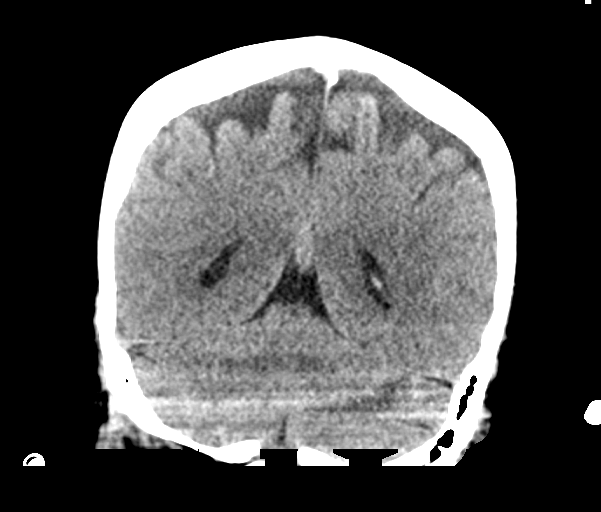
[im 33/74  brain]
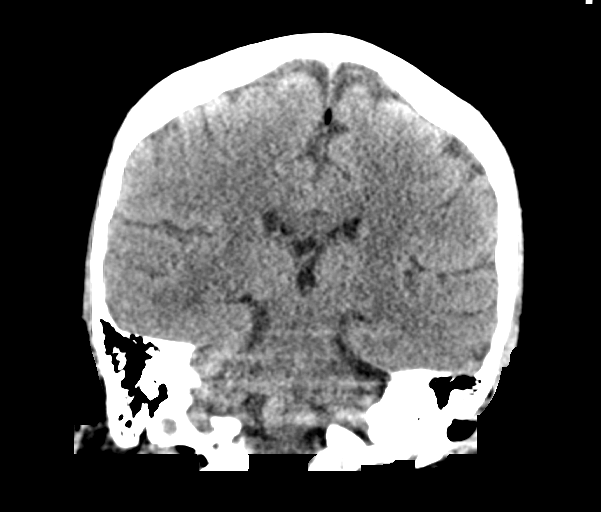
[im 41/74  brain]
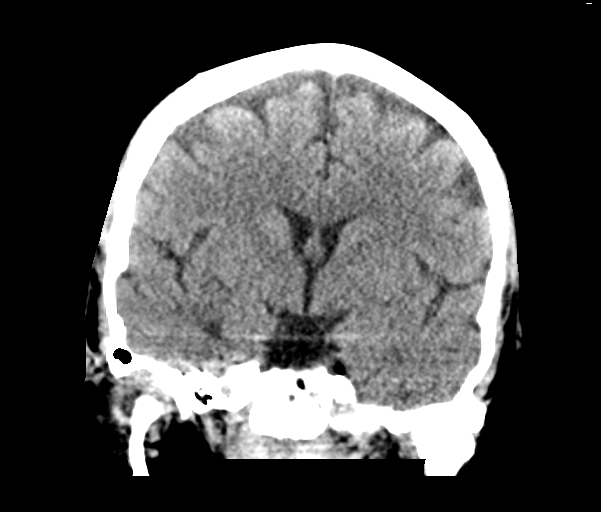

[Series 5: sagittal soft tissue · sagittal · 0.36mm/px · 3 of 67 slices shown]
[im 23/67  brain]
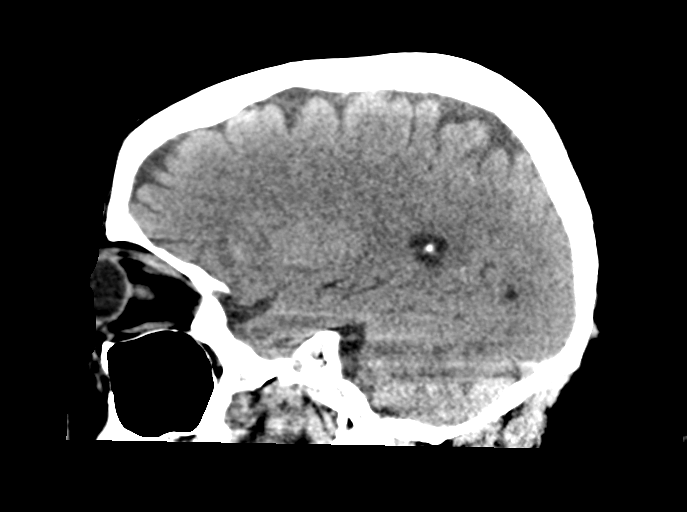
[im 34/67  brain]
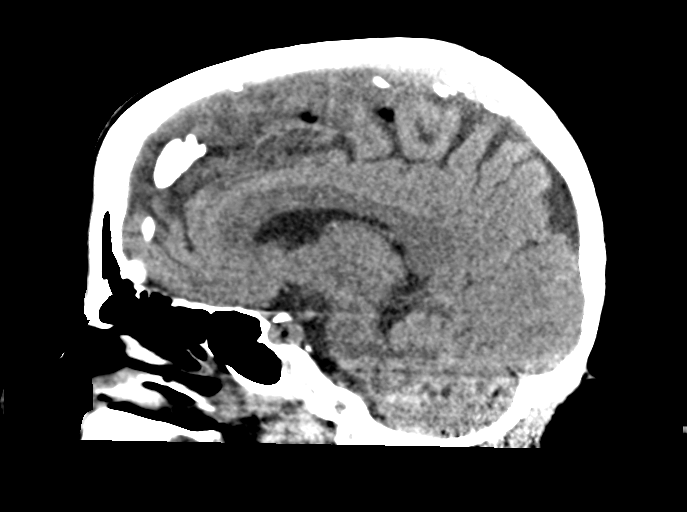
[im 45/67  brain]
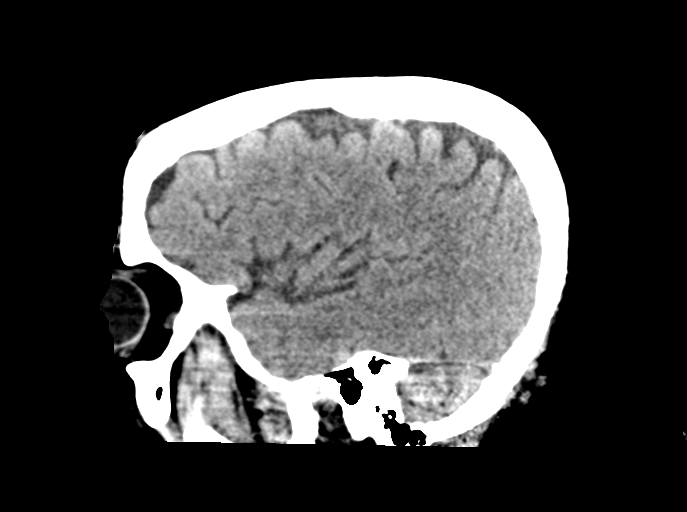

[15 of 47 positions shown; findings below may reference images not displayed]

FINDINGS: Brain: No acute infarct, hemorrhage, or mass lesion is present. The
ventricles are of normal size. No significant white matter disease
is present. The brainstem and cerebellum are within normal limits.
No significant extraaxial fluid collection is present.

Vascular: No hyperdense vessel or unexpected calcification.

Skull: Calvarium is intact. No acute or healing fractures are
present. No focal lytic or blastic lesions are present.

Sinuses/Orbits: The paranasal sinuses and mastoid air cells are
clear. Bilateral exophthalmos is noted. No retro-orbital mass is
present. Globes and orbits are otherwise within normal limits.
IMPRESSION: 1. Normal CT appearance of brain.
2. Bilateral exophthalmos.  Question thyroid disease.
